# Patient Record
Sex: Male | Born: 1950
Health system: Southern US, Community
[De-identification: ages and names within clinical notes are randomized; demographics above are authoritative.]

## PROBLEM LIST (undated history)

## (undated) DIAGNOSIS — K5790 Diverticulosis of intestine, part unspecified, without perforation or abscess without bleeding: Secondary | ICD-10-CM

## (undated) DIAGNOSIS — K469 Unspecified abdominal hernia without obstruction or gangrene: Secondary | ICD-10-CM

## (undated) DIAGNOSIS — I219 Acute myocardial infarction, unspecified: Secondary | ICD-10-CM

## (undated) DIAGNOSIS — I2510 Atherosclerotic heart disease of native coronary artery without angina pectoris: Secondary | ICD-10-CM

## (undated) DIAGNOSIS — I471 Supraventricular tachycardia, unspecified: Secondary | ICD-10-CM

## (undated) DIAGNOSIS — E785 Hyperlipidemia, unspecified: Secondary | ICD-10-CM

## (undated) DIAGNOSIS — I10 Essential (primary) hypertension: Secondary | ICD-10-CM

## (undated) DIAGNOSIS — M1990 Unspecified osteoarthritis, unspecified site: Secondary | ICD-10-CM

## (undated) DIAGNOSIS — Q249 Congenital malformation of heart, unspecified: Secondary | ICD-10-CM

## (undated) DIAGNOSIS — T7840XA Allergy, unspecified, initial encounter: Secondary | ICD-10-CM

## (undated) DIAGNOSIS — I493 Ventricular premature depolarization: Secondary | ICD-10-CM

## (undated) DIAGNOSIS — E559 Vitamin D deficiency, unspecified: Secondary | ICD-10-CM

## (undated) DIAGNOSIS — G4730 Sleep apnea, unspecified: Secondary | ICD-10-CM

## (undated) DIAGNOSIS — F419 Anxiety disorder, unspecified: Secondary | ICD-10-CM

## (undated) DIAGNOSIS — K219 Gastro-esophageal reflux disease without esophagitis: Secondary | ICD-10-CM

## (undated) DIAGNOSIS — K635 Polyp of colon: Secondary | ICD-10-CM

## (undated) HISTORY — DX: Gastro-esophageal reflux disease without esophagitis: K21.9

## (undated) HISTORY — DX: Unspecified osteoarthritis, unspecified site: M19.90

## (undated) HISTORY — DX: Vitamin D deficiency, unspecified: E55.9

## (undated) HISTORY — PX: ROOT CANAL: SHX2363

## (undated) HISTORY — DX: Anxiety disorder, unspecified: F41.9

## (undated) HISTORY — DX: Hyperlipidemia, unspecified: E78.5

## (undated) HISTORY — PX: COLONOSCOPY: SHX174

## (undated) HISTORY — PX: CORONARY ANGIOPLASTY WITH STENT PLACEMENT: SHX49

## (undated) HISTORY — DX: Ventricular premature depolarization: I49.3

## (undated) HISTORY — DX: Allergy, unspecified, initial encounter: T78.40XA

## (undated) HISTORY — DX: Essential (primary) hypertension: I10

## (undated) HISTORY — DX: Diverticulosis of intestine, part unspecified, without perforation or abscess without bleeding: K57.90

## (undated) HISTORY — DX: Congenital malformation of heart, unspecified: Q24.9

## (undated) HISTORY — DX: Supraventricular tachycardia, unspecified: I47.10

## (undated) HISTORY — DX: Unspecified abdominal hernia without obstruction or gangrene: K46.9

## (undated) HISTORY — DX: Atherosclerotic heart disease of native coronary artery without angina pectoris: I25.10

## (undated) HISTORY — PX: OTHER SURGICAL HISTORY: SHX169

## (undated) HISTORY — PX: CARDIAC ELECTROPHYSIOLOGY MAPPING AND ABLATION: SHX1292

## (undated) HISTORY — DX: Polyp of colon: K63.5

## (undated) HISTORY — DX: Supraventricular tachycardia: I47.1

## (undated) HISTORY — DX: Sleep apnea, unspecified: G47.30

## (undated) HISTORY — PX: APPENDECTOMY: SHX54

## (undated) HISTORY — PX: ACHILLES TENDON REPAIR: SUR1153

## (undated) HISTORY — DX: Acute myocardial infarction, unspecified: I21.9

## (undated) SURGERY — Surgical Case
Anesthesia: *Unknown

---

## 2001-04-17 ENCOUNTER — Encounter: Payer: Self-pay | Admitting: *Deleted

## 2001-04-17 ENCOUNTER — Inpatient Hospital Stay (HOSPITAL_COMMUNITY): Admission: EM | Admit: 2001-04-17 | Discharge: 2001-04-20 | Payer: Self-pay | Admitting: *Deleted

## 2001-05-18 ENCOUNTER — Encounter (HOSPITAL_COMMUNITY): Admission: RE | Admit: 2001-05-18 | Discharge: 2001-08-16 | Payer: Self-pay | Admitting: Cardiology

## 2001-10-04 ENCOUNTER — Ambulatory Visit (HOSPITAL_COMMUNITY): Admission: RE | Admit: 2001-10-04 | Discharge: 2001-10-04 | Payer: Self-pay | Admitting: Internal Medicine

## 2001-10-04 ENCOUNTER — Encounter: Payer: Self-pay | Admitting: Internal Medicine

## 2004-08-12 ENCOUNTER — Ambulatory Visit: Payer: Self-pay | Admitting: Internal Medicine

## 2004-09-10 ENCOUNTER — Ambulatory Visit: Payer: Self-pay | Admitting: Internal Medicine

## 2004-09-16 ENCOUNTER — Ambulatory Visit: Payer: Self-pay | Admitting: Internal Medicine

## 2007-12-13 ENCOUNTER — Ambulatory Visit (HOSPITAL_COMMUNITY): Admission: RE | Admit: 2007-12-13 | Discharge: 2007-12-13 | Payer: Self-pay | Admitting: Internal Medicine

## 2009-08-13 ENCOUNTER — Encounter (INDEPENDENT_AMBULATORY_CARE_PROVIDER_SITE_OTHER): Payer: Self-pay | Admitting: *Deleted

## 2010-02-05 NOTE — Letter (Signed)
Summary: Colonoscopy Letter  [facility]  [address] [place], [place] [address]   Phone: [phone]  Fax: [phone]      August 13, 2009 MRN: [id number]   [facility] [address] [place], [place]  [address]   Dear Mr. COLGLAZIER,   According to your medical record, it is time for you to schedule a Colonoscopy. The American Cancer Society recommends this procedure as a method to detect early colon cancer. Patients with a family history of colon cancer, or a personal history of colon polyps or inflammatory bowel disease are at increased risk.  This letter has been generated based on the recommendations made at the time of your procedure. If you feel that in your particular situation this may no longer apply, please contact our office.  Please call our office at [phone] to schedule this appointment or to update your records at your earliest convenience.  Thank you for cooperating with [place] to provide you with the very best care possible.   Sincerely,  Hedwig Morton. Juanda Chance, M.D.  [facility] Gastroenterology Division [phone]

## 2010-05-24 NOTE — Discharge Summary (Signed)
[place]. [facility]  Patient:    Bryan [name], Bryan [name] Visit Number: [id number] MRN: [id number]          Service Type: MED Location: [facility] 2901 01 Attending Physician:  Mora Appl Dictated by:   Darden Palmer., M.D. Admit Date:  04/17/2001 Disc. Date: 04/20/01   CC:         Sung Amabile, M.D.   Discharge Summary  FINAL DIAGNOSIS: 1. Subendocardial infarction, initial episode.    a. Stenting done of the obtuse marginal artery.    b. Stenosis going from 99% to 0%.    c. Residual atherosclerotic disease involving the distal LAD of       moderate severity.    d. Mild to moderate severity of the proximal LAD.    e. Moderate to severe right coronary artery disease. 2. Treated hypertension. 3. Treated hyperlipidemia. 4. Low back pain.  PROCEDURES: Cardiac catheterization, angioplasty, and stenting of the circumflex marginal branch.  HISTORY OF PRESENT ILLNESS: This 60 year old male had long standing history of hypertension and hyperlipidemia. He had had a Cardiolite scan done several weeks ago that was negative. While at a barbecue at church, he assisted in carrying a grill and developed crushing substernal chest pain. He took three Tylenol and Xanax. He attributed the pain to muscle spasm. He took baby aspirin. He attempted to lay flat but became anxious, diaphoretic, with nausea and vomiting. Following the emesis, the pain resolved somewhat. He had a strongly positive family history of heart disease and high cholesterol. He was admitted to rule out an MI. Please see the previously dictated history and physical for remained of details.  LABORATORY DATA: On admission shows hemoglobin of 15.3, hematocrit of 43.8. Platelet count 207,000. Potassium 3.3, glucose 139, BUN 20, creatinine 1.6, repeat potassium is 4.0. On the next day, repeat glucose was 81. Initial CPK was 247 with MB of 3.8 and rose to 340 with an MB of 24.4 and troponin of 1.13. CPK  peaked at 340. He was placed on IV integrilin and Nitroglycerin when he had some recurrent symptoms. He had ST depression noted on EKG.  HOSPITAL COURSE: He was taken to the catheterization laboratory on April 19, 2001 where he was found to have normal LV function. LAD had moderate 40-50% proximal stenosis. There was distal 70-80% stenosis at the apex. Circumflex marginal had an eccentric 99% stenosis in a large vessel. 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 He underwent angioplasty and stenting of the circumflex marginal branch with a 3.5 x 13 mm Zeta stent, with stenosis going from 99% to 0% residual. He continued on Tegrelin following this for 18 hours and was stable. MB was not elevated significantly following the procedure. EKG showed improvement in lateral ST depression. He was seen by cardiac rehab and was recommended for phase II. Discussed diet, activity restrictions and also that he is to avoid heavy weight lifting. It is recommended that he have an LDL cholesterol of less than 100. He may need to go back on statin therapy to accomplish this.  DISCHARGE MEDICATIONS: 1. Aspirin 325 mg daily. 2. Plavix 75 mg daily. 3. Atenolol 100 mg daily. 4. Accupril 20 mg daily. 5. Nitroglycerin 1/150 sublingual p.r.n. 6. Tricor 167 mg daily.  ACTIVITY: He is to walk daily and see me in follow-up in one week instead of two weeks. Dictated by:   Darden Palmer., M.D. Attending Physician:  Mora Appl DD:  04/20/01 TD:  04/20/01 Job: [id number]

## 2010-05-24 NOTE — Cardiovascular Report (Signed)
[place]. [facility]  Patient:    Bryan [name], Bryan [name] Visit Number: [id number] MRN: [id number]          Service Type: MED Location: [facility] 2901 01 Attending Physician:  Mora Appl Dictated by:   Darden Palmer., M.D. Proc. Date: 04/19/01 Admit Date:  04/17/2001   CC:         Marinus Maw, M.D.   Cardiac Catheterization  HISTORY:  A 60 year old male with acute coronary syndrome with a non-Q-wave infarction.  COMMENTS ABOUT PROCEDURE:  The patient was brought to the catheterization laboratory and prepped and draped in the usual manner.  After Xylocaine anesthesia, a #6 French sheath was placed in the right femoral artery percutaneously.  Angiograms were made using #6 French catheters and a 30-cc ventriculogram was performed.  Arrangements were made to proceed with stenting.  Heparin, 4500 units, was administered with an ACT of 250.  The patient was previously on Integrilin and IV nitroglycerin.  The sheath was exchanged for a #7 [place] sheath.  Angioplasty equipment used was a #7 [place] JL-4 guiding catheter, HTF-J 0.014 guidewire which crossed the lesion easily. A 3.5 x 18-mm Zeta Multilink stent was deployed easily to a maximum of 13 atmospheres.  He had minimal symptoms during balloon inflations.  Following the stent, post dilatation angiograms were performed and he was returned to the transitional care unit in stable condition.  Plavix, 300 mg, was administered in the lab.  He tolerated the procedure well.  HEMODYNAMIC DATA: 1. Aorta post contrast 114/79. 2. LV post contrast 114/12.  ANGIOGRAPHIC DATA:  LEFT VENTRICULOGRAM:  Performed in the 30-degree RAO projection.  Aortic valve was normal.  The mitral valve was normal.  The left ventricle appears normal in size.  The estimated ejection fraction was 60-65%.  CORONARY ARTERIOGRAPHY:  Coronary arteries arise and distribute normally. There is mild calcification noted in the proximal  LAD as well as the distal right coronary artery. 1. The left main coronary artery appears normal.  2. The left anterior descending has moderate segmental proximal disease.    There is severe 75-80% stenosis present at the apex.  3. The circumflex marginal artery has an eccentric segmental 99% stenosis with    TIMI grade 2 flow noted.  4. The right coronary artery is a large vessel with some aneurysmal dilatation    noted.  There is some focal 60% mid vessel stenosis and a 70% stenosis with    some aneurysmal dilatation.  The ostium of the posterior descending has a    70% stenosis and the mid portion of the posterior descending has a 70%    stenosis.  Post dilatation angiograms of the circumflex reveal 0% residual stenosis. There is full stent deployment and preservation of all side branches.  TIMI flow is now grade 3.  IMPRESSION: 1. Successful stenting in the setting of an acute coronary syndrome of the    circumflex with stenosis going from 99% to 0%. 2. Residual atherosclerotic disease involving the apex of the left anterior    descending, the mid right coronary artery, and the posterior descending    artery, with mild to moderate disease in the proximal left anterior    descending artery. 3. Normal left ventricular function. Dictated by:   Darden Palmer., M.D. Attending Physician:  Mora Appl DD:  04/19/01 TD:  04/19/01 Job: [id number]

## 2010-06-27 ENCOUNTER — Encounter (INDEPENDENT_AMBULATORY_CARE_PROVIDER_SITE_OTHER): Payer: Self-pay | Admitting: Surgery

## 2011-01-21 ENCOUNTER — Ambulatory Visit (HOSPITAL_COMMUNITY)
Admission: RE | Admit: 2011-01-21 | Discharge: 2011-01-21 | Disposition: A | Discharge status: Home / Self Care | Payer: Managed Care, Other (non HMO) | Source: Ambulatory Visit | Attending: Internal Medicine | Admitting: Internal Medicine

## 2011-01-21 ENCOUNTER — Other Ambulatory Visit (HOSPITAL_COMMUNITY): Payer: Self-pay | Admitting: Internal Medicine

## 2011-01-21 DIAGNOSIS — R0989 Other specified symptoms and signs involving the circulatory and respiratory systems: Secondary | ICD-10-CM | POA: Insufficient documentation

## 2011-01-21 DIAGNOSIS — R05 Cough: Secondary | ICD-10-CM

## 2011-01-21 DIAGNOSIS — R059 Cough, unspecified: Secondary | ICD-10-CM | POA: Insufficient documentation

## 2011-09-09 ENCOUNTER — Encounter: Payer: Self-pay | Admitting: Internal Medicine

## 2011-10-07 ENCOUNTER — Encounter: Payer: Self-pay | Admitting: Internal Medicine

## 2011-11-17 ENCOUNTER — Encounter: Payer: Self-pay | Admitting: *Deleted

## 2011-12-10 ENCOUNTER — Encounter: Payer: Self-pay | Admitting: Internal Medicine

## 2011-12-10 ENCOUNTER — Ambulatory Visit (INDEPENDENT_AMBULATORY_CARE_PROVIDER_SITE_OTHER): Payer: BC Managed Care – PPO | Admitting: Internal Medicine

## 2011-12-10 VITALS — BP 100/68 | HR 72 | Ht 69.5 in | Wt 212.6 lb

## 2011-12-10 DIAGNOSIS — D689 Coagulation defect, unspecified: Secondary | ICD-10-CM

## 2011-12-10 DIAGNOSIS — Z1211 Encounter for screening for malignant neoplasm of colon: Secondary | ICD-10-CM

## 2011-12-10 MED ORDER — MOVIPREP 100 G PO SOLR: 1.0000 | Freq: Once | ORAL | Status: DC

## 2011-12-10 NOTE — Progress Notes (Signed)
Bryan [name] 02/23/1950 MRN [id number]    History of Present Illness:  This is a 61 year old white male who is here to discuss a recall colonoscopy. His mother, who is our patient, had in situ adenocarcinoma of the rectosigmoid. His last colonoscopy was in September 2006 and it was a normal exam. He, at that time was taking Plavix and he remained on Plavix throughout the colonoscopy. There is a history of subendocardial infarction in 2003. He has been followed by Dr Donnie Aho and has a Cardiolite scan every 2 years. He was told to continue Plavix indefinitely. He has occasional right lower quadrant abdominal discomfort. His bowel habits are otherwise regular with no rectal bleeding.   Past Medical History  Diagnosis Date  . Heart attack   . Hyperlipidemia   . Hypertension   . Hernia   . Diverticulosis    Past Surgical History  Procedure Date  . Appendectomy   . Heart catherization   . Achilles tendon repair     left  . Cardiac electrophysiology mapping and ablation     reports that he has never smoked. He has never used smokeless tobacco. He reports that he drinks alcohol. He reports that he does not use illicit drugs. family history includes Cancer in his father; Colon cancer in his mother; Hypothyroidism in his mother; and Irritable bowel syndrome in his mother. Allergies  Allergen Reactions  . Penicillins Swelling        Review of Systems:  The remainder of the 10 point ROS is negative except as outlined in H&P   Physical Exam: General appearance  Well developed, in no distress. Eyes- non icteric. HEENT nontraumatic, normocephalic. Mouth no lesions, tongue papillated, no cheilosis. Neck supple without adenopathy, thyroid not enlarged, no carotid bruits, no JVD. Lungs Clear to auscultation bilaterally. Cor normal S1, normal S2, regular rhythm, no murmur,  quiet precordium. Abdomen: Soft nontender abdomen with normal active bowel sounds. No distention. No palpable  mass. Rectal: Not done. Extremities no pedal edema. Skin no lesions. Neurological alert and oriented x 3. Psychological normal mood and affect.  Assessment and Plan:  Problem #1 This is a 61 year old white male who is due for a recall colonoscopy because of family history of colon cancer in his mother. We would like to hold the Plavix prior to the colonoscopy. We will ask Dr. York Spaniel permission to do that. If he cannot get off Plavix, we will proceed with a colonoscopy while on Plavix. Patient agrees with the plan. We have discussed the prep and the sedation as well.   12/10/2011 Lina Sar

## 2011-12-10 NOTE — Patient Instructions (Addendum)
You have been scheduled for a colonoscopy with propofol. Please follow written instructions given to you at your visit today.  Please pick up your prep kit at the pharmacy within the next 1-3 days. If you use inhalers (even only as needed) or a CPAP machine, please bring them with you on the day of your procedure.  You will be contacted by our office prior to your procedure for directions on holding your Plavix.  If you do not hear from our office 1 week prior to your scheduled procedure, please call [phone] to discuss.   CC: Dr Lucky Cowboy, Dr Ellwood Handler

## 2011-12-11 ENCOUNTER — Telehealth: Payer: Self-pay | Admitting: *Deleted

## 2011-12-11 NOTE — Telephone Encounter (Signed)
Patient advised that Per Dr Donnie Aho, he may discontinue his Plavix 5 days prior to his colonoscopy and restart after procedure. Patient verbalizes understanding.

## 2011-12-24 ENCOUNTER — Encounter: Payer: BC Managed Care – PPO | Admitting: Internal Medicine

## 2011-12-24 ENCOUNTER — Telehealth: Payer: Self-pay | Admitting: *Deleted

## 2011-12-24 ENCOUNTER — Telehealth: Payer: Self-pay | Admitting: Internal Medicine

## 2011-12-24 NOTE — Telephone Encounter (Signed)
Patient was scheduled for colonoscopy on 12/24/11 @ 11:30 am. He no showed this appointment. Dr Juanda Chance and patient spoke and patient was apparently confused about his appointment date. He thought the procedure was on 12/25/11. Dr Juanda Chance has asked that patient be added to a schedule for tomorrow, 12/25/11 @ 11:00 am for colonoscopy procedure. I have spoken with Jennye Boroughs, RN who states that since no 11:00 am procedure is scheduled for Dr Russella Dar, Dr Juanda Chance may take that opening. Therefore, patient has been scheduled for 11:00 am 12/25/11 in [facility] for colon. I have spoken with patient and have again gone over the revised prep instructions (as it seems he ate breakfast this morning and was not clear on when to start drinking his prep). He states that he does understands. Dr Juanda Chance advised of upcoming appointment as well.

## 2011-12-24 NOTE — Telephone Encounter (Signed)
Tried patients home, cell, and work number as per chart to see if patient cancelled his procedure for today as there are time discrepancies. Left messages on all numbers in chart that identify patient by first and last name. Waiting for a return call. ewm

## 2011-12-25 ENCOUNTER — Ambulatory Visit (AMBULATORY_SURGERY_CENTER): Payer: BC Managed Care – PPO | Admitting: Internal Medicine

## 2011-12-25 ENCOUNTER — Encounter: Payer: Self-pay | Admitting: Internal Medicine

## 2011-12-25 ENCOUNTER — Telehealth: Payer: Self-pay | Admitting: *Deleted

## 2011-12-25 VITALS — BP 110/60 | HR 65 | Temp 97.8°F | Resp 18 | Ht 69.5 in | Wt 212.0 lb

## 2011-12-25 DIAGNOSIS — Z1211 Encounter for screening for malignant neoplasm of colon: Secondary | ICD-10-CM

## 2011-12-25 DIAGNOSIS — D689 Coagulation defect, unspecified: Secondary | ICD-10-CM

## 2011-12-25 DIAGNOSIS — Z800 Family history of malignant neoplasm of digestive organs: Secondary | ICD-10-CM

## 2011-12-25 MED ORDER — SODIUM CHLORIDE 0.9 % IV SOLN: 500.0000 mL | INTRAVENOUS | Status: DC

## 2011-12-25 NOTE — Progress Notes (Signed)
Patient did not experience any of the following events: a burn prior to discharge; a fall within the facility; wrong site/side/patient/procedure/implant event; or a hospital transfer or hospital admission upon discharge from the facility. (G8907) Patient did not have preoperative order for IV antibiotic SSI prophylaxis. (G8918)  

## 2011-12-25 NOTE — Telephone Encounter (Signed)
  Follow up Call-home phone/ not his phone     Patient questions:   No message left.

## 2011-12-25 NOTE — Op Note (Signed)
[facility] [address] [place], [address]   COLONOSCOPY PROCEDURE REPORT  PATIENT: Bryan [name], Bryan [name]  MR#: [id number] BIRTHDATE: [birth year] , 61  yrs. old GENDER: Male ENDOSCOPIST: Hart Carwin, MD REFERRED BY:  Lucky Cowboy, M.D. ,Dr Justice Britain PROCEDURE DATE:  12/25/2011 PROCEDURE:   Colonoscopy, screening ASA CLASS:   Class III INDICATIONS:Patient's immediate family history of colon cancer and mother with colon cancer, last colon 2006, hx MI 2003 Plavix on hold. MEDICATIONS: MAC sedation, administered by CRNA and Propofol (Diprivan) 180 mg IV  DESCRIPTION OF PROCEDURE:   After the risks and benefits and of the procedure were explained, informed consent was obtained.  A digital rectal exam revealed no abnormalities of the rectum.    The LB [id number]  endoscope was introduced through the anus and advanced to the cecum, which was identified by both the appendix and ileocecal valve .  The quality of the prep was excellent, using MoviPrep .  The instrument was then slowly withdrawn as the colon was fully examined.     COLON FINDINGS: A normal appearing cecum, ileocecal valve, and appendiceal orifice were identified.  The ascending, hepatic flexure, transverse, splenic flexure, descending, sigmoid colon and rectum appeared unremarkable.  No polyps or cancers were seen. Retroflexed views revealed no abnormalities.     The scope was then withdrawn from the patient and the procedure completed.  COMPLICATIONS: There were no complications. ENDOSCOPIC IMPRESSION: Normal colon  RECOMMENDATIONS: High fiber diet   REPEAT EXAM: In 5 year(s)  for Colonoscopy.  cc:  _______________________________ eSignedHart Carwin, MD 12/25/2011 11:24 AM

## 2011-12-25 NOTE — Progress Notes (Signed)
1125 a/ox3 pleased with MAC report to [facility]

## 2011-12-25 NOTE — Patient Instructions (Addendum)
Discharge instructions given with verbal understanding. Normal exam. Resume previous medications. YOU HAD AN ENDOSCOPIC PROCEDURE TODAY AT THE [facility]: Refer to the procedure report that was given to you for any specific questions about what was found during the examination.  If the procedure report does not answer your questions, please call your gastroenterologist to clarify.  If you requested that your care partner not be given the details of your procedure findings, then the procedure report has been included in a sealed envelope for you to review at your convenience later.  YOU SHOULD EXPECT: Some feelings of bloating in the abdomen. Passage of more gas than usual.  Walking can help get rid of the air that was put into your GI tract during the procedure and reduce the bloating. If you had a lower endoscopy (such as a colonoscopy or flexible sigmoidoscopy) you may notice spotting of blood in your stool or on the toilet paper. If you underwent a bowel prep for your procedure, then you may not have a normal bowel movement for a few days.  DIET: Your first meal following the procedure should be a light meal and then it is ok to progress to your normal diet.  A half-sandwich or bowl of soup is an example of a good first meal.  Heavy or fried foods are harder to digest and may make you feel nauseous or bloated.  Likewise meals heavy in dairy and vegetables can cause extra gas to form and this can also increase the bloating.  Drink plenty of fluids but you should avoid alcoholic beverages for 24 hours.  ACTIVITY: Your care partner should take you home directly after the procedure.  You should plan to take it easy, moving slowly for the rest of the day.  You can resume normal activity the day after the procedure however you should NOT DRIVE or use heavy machinery for 24 hours (because of the sedation medicines used during the test).    SYMPTOMS TO REPORT IMMEDIATELY: A gastroenterologist  can be reached at any hour.  During normal business hours, 8:30 AM to 5:00 PM Monday through Friday, call [phone].  After hours and on weekends, please call the GI answering service at [phone] who will take a message and have the physician on call contact you.   Following lower endoscopy (colonoscopy or flexible sigmoidoscopy):  Excessive amounts of blood in the stool  Significant tenderness or worsening of abdominal pains  Swelling of the abdomen that is new, acute  Fever of 100F or higher  FOLLOW UP: If any biopsies were taken you will be contacted by phone or by letter within the next 1-3 weeks.  Call your gastroenterologist if you have not heard about the biopsies in 3 weeks.  Our staff will call the home number listed on your records the next business day following your procedure to check on you and address any questions or concerns that you may have at that time regarding the information given to you following your procedure. This is a courtesy call and so if there is no answer at the home number and we have not heard from you through the emergency physician on call, we will assume that you have returned to your regular daily activities without incident.  SIGNATURES/CONFIDENTIALITY: You and/or your care partner have signed paperwork which will be entered into your electronic medical record.  These signatures attest to the fact that that the information above on your After Visit Summary has been reviewed   and is understood.  Full responsibility of the confidentiality of this discharge information lies with you and/or your care-partner. 

## 2011-12-26 ENCOUNTER — Telehealth: Payer: Self-pay

## 2011-12-26 NOTE — Telephone Encounter (Signed)
Left message on answering machine. 

## 2012-09-06 ENCOUNTER — Other Ambulatory Visit: Payer: Self-pay

## 2012-09-06 ENCOUNTER — Encounter (HOSPITAL_COMMUNITY): Payer: Self-pay | Admitting: *Deleted

## 2012-09-06 DIAGNOSIS — E785 Hyperlipidemia, unspecified: Secondary | ICD-10-CM | POA: Insufficient documentation

## 2012-09-06 DIAGNOSIS — Z8674 Personal history of sudden cardiac arrest: Secondary | ICD-10-CM | POA: Insufficient documentation

## 2012-09-06 DIAGNOSIS — T4271XA Poisoning by unspecified antiepileptic and sedative-hypnotic drugs, accidental (unintentional), initial encounter: Secondary | ICD-10-CM | POA: Insufficient documentation

## 2012-09-06 DIAGNOSIS — Z880 Allergy status to penicillin: Secondary | ICD-10-CM | POA: Insufficient documentation

## 2012-09-06 DIAGNOSIS — I10 Essential (primary) hypertension: Secondary | ICD-10-CM | POA: Insufficient documentation

## 2012-09-06 DIAGNOSIS — T426X1A Poisoning by other antiepileptic and sedative-hypnotic drugs, accidental (unintentional), initial encounter: Secondary | ICD-10-CM | POA: Insufficient documentation

## 2012-09-06 DIAGNOSIS — Y939 Activity, unspecified: Secondary | ICD-10-CM | POA: Insufficient documentation

## 2012-09-06 DIAGNOSIS — Z79899 Other long term (current) drug therapy: Secondary | ICD-10-CM | POA: Insufficient documentation

## 2012-09-06 DIAGNOSIS — Z8719 Personal history of other diseases of the digestive system: Secondary | ICD-10-CM | POA: Insufficient documentation

## 2012-09-06 DIAGNOSIS — Y929 Unspecified place or not applicable: Secondary | ICD-10-CM | POA: Insufficient documentation

## 2012-09-06 NOTE — ED Notes (Addendum)
Poison Control called ED prior to POV.  Pt. Took x 3 200mg  benzonatate tablets, instead of Vitamin.  Concerns: Tachycardia, Seizures. Poison Control concerned more so ventricular tachycardia. Hx. Of Mi. Poison control would like for the ED to monitor for 3 hours.

## 2012-09-07 ENCOUNTER — Emergency Department (HOSPITAL_COMMUNITY)
Admission: EM | Admit: 2012-09-07 | Discharge: 2012-09-07 | Disposition: A | Discharge status: Home / Self Care | Payer: BC Managed Care – PPO | Attending: Emergency Medicine | Admitting: Emergency Medicine

## 2012-09-07 DIAGNOSIS — T50901A Poisoning by unspecified drugs, medicaments and biological substances, accidental (unintentional), initial encounter: Secondary | ICD-10-CM

## 2012-09-07 LAB — COMPREHENSIVE METABOLIC PANEL
Albumin: 3.4 g/dL — ABNORMAL LOW (ref 3.5–5.2)
BUN: 15 mg/dL (ref 6–23)
Calcium: 8.8 mg/dL (ref 8.4–10.5)
GFR calc Af Amer: >90 mL/min (ref >90)
Glucose, Bld: 100 mg/dL — ABNORMAL HIGH (ref 70–99)
Sodium: 137 mEq/L (ref 135–145)
Total Protein: 6.3 g/dL (ref 6.0–8.3)

## 2012-09-07 LAB — CBC WITH DIFFERENTIAL/PLATELET
Basophils Absolute: 0 10*3/uL (ref 0.0–0.1)
Lymphocytes Relative: 27 % (ref 12–46)
Neutro Abs: 3.7 10*3/uL (ref 1.7–7.7)
Neutrophils Relative %: 59 % (ref 43–77)
Platelets: 149 10*3/uL — ABNORMAL LOW (ref 150–400)
RDW: 12.2 % (ref 11.5–15.5)
WBC: 6.3 10*3/uL (ref 4.0–10.5)

## 2012-09-07 NOTE — ED Notes (Signed)
Patient took Tessalon Pearls 200 mg x 4 tablets at 2300 tonight by accident. He thought that they were his Vitamin D pills. States the they both look alike. Both are clear capsules with a yellow tint. Realized that he took the wrong medication when he went to put the bottle back into his medicine bag. Immediately notified poison control. Was instructed to report to the ED for evaluation. Patient AAOx4, resp e/u, VSS, NAD noted at this time. Will continue to monitor.

## 2012-09-07 NOTE — ED Provider Notes (Signed)
CSN: [id number]     Arrival date & time 09/06/12  2340 History   First MD Initiated Contact with Patient 09/07/12 0115     Chief Complaint  Patient presents with  . Ingestion   (Consider location/radiation/quality/duration/timing/severity/associated sxs/prior Treatment) Patient is a 62 y.o. male presenting with Ingested Medication. The history is provided by the patient.  Ingestion  He accidentally took 4 capsules a benzonatate 200 mg at about 11 PM. He but he was taking vitamin D capsules which looked very similar. He called poison control who recommended that he come to the ED for monitoring. He has no complaints whatsoever now. He denies nausea, vomiting, dizziness, chest pain, palpitations.  Past Medical History  Diagnosis Date  . Heart attack   . Hyperlipidemia   . Hypertension   . Hernia   . Diverticulosis    Past Surgical History  Procedure Laterality Date  . Appendectomy    . Heart catherization    . Achilles tendon repair      left  . Cardiac electrophysiology mapping and ablation     Family History  Problem Relation Age of Onset  . Colon cancer Mother     malignant polyp of rectosigmoid colon  . Hypothyroidism Mother   . Irritable bowel syndrome Mother   . Cancer Father     brain   History  Substance Use Topics  . Smoking status: Never Smoker   . Smokeless tobacco: Never Used  . Alcohol Use: Yes     Comment: rarely    Review of Systems  All other systems reviewed and are negative.    Allergies  Penicillins  Home Medications   Current Outpatient Rx  Name  Route  Sig  Dispense  Refill  . benzonatate (TESSALON) 200 MG capsule   Oral   Take 800 mg by mouth once.         . Cholecalciferol (VITAMIN D-3) 5000 UNITS TABS   Oral   Take 20,000 Units by mouth every other day.         . clonazePAM (KLONOPIN) 2 MG tablet   Oral   Take 2 mg by mouth at bedtime.          . clopidogrel (PLAVIX) 75 MG tablet   Oral   Take 75 mg by mouth daily.         . rosuvastatin (CRESTOR) 10 MG tablet   Oral   Take 10 mg by mouth daily.         . simvastatin (ZOCOR) 5 MG tablet   Oral   Take 5 mg by mouth at bedtime.          BP 112/64  Pulse 66  Temp(Src) 97.7 F (36.5 C)  Resp 9  Ht 5\' 10"  (1.778 m)  Wt 205 lb (92.987 kg)  BMI 29.41 kg/m2  SpO2 95% Physical Exam  Nursing note and vitals reviewed.  62 year old male, resting comfortably and in no acute distress. Vital signs are normal. Oxygen saturation is 95%, which is normal. Head is normocephalic and atraumatic. PERRLA, EOMI. Oropharynx is clear. Neck is nontender and supple without adenopathy or JVD. Back is nontender and there is no CVA tenderness. Lungs are clear without rales, wheezes, or rhonchi. Chest is nontender. Heart has regular rate and rhythm without murmur. Abdomen is soft, flat, nontender without masses or hepatosplenomegaly and peristalsis is normoactive. Extremities have no cyanosis or edema, full range of motion is present. Skin is warm and dry without rash. Neurologic:  Mental status is normal, cranial nerves are intact, there are no motor or sensory deficits.  ED Course  Procedures (including critical care time) Labs Review Results for orders placed during the hospital encounter of 09/07/12  CBC WITH DIFFERENTIAL      Result Value Range   WBC 6.3  4.0 - 10.5 K/uL   RBC 5.20  4.22 - 5.81 MIL/uL   Hemoglobin 15.7  13.0 - 17.0 g/dL   HCT 16.1  09.6 - 04.5 %   MCV 82.7  78.0 - 100.0 fL   MCH 30.2  26.0 - 34.0 pg   MCHC 36.5 (*) 30.0 - 36.0 g/dL   RDW 40.9  81.1 - 91.4 %   Platelets 149 (*) 150 - 400 K/uL   Neutrophils Relative % 59  43 - 77 %   Neutro Abs 3.7  1.7 - 7.7 K/uL   Lymphocytes Relative 27  12 - 46 %   Lymphs Abs 1.7  0.7 - 4.0 K/uL   Monocytes Relative 8  3 - 12 %   Monocytes Absolute 0.5  0.1 - 1.0 K/uL   Eosinophils Relative 6 (*) 0 - 5 %   Eosinophils Absolute 0.4  0.0 - 0.7 K/uL   Basophils Relative 0  0 - 1 %   Basophils  Absolute 0.0  0.0 - 0.1 K/uL  COMPREHENSIVE METABOLIC PANEL      Result Value Range   Sodium 137  135 - 145 mEq/L   Potassium 3.6  3.5 - 5.1 mEq/L   Chloride 103  96 - 112 mEq/L   CO2 25  19 - 32 mEq/L   Glucose, Bld 100 (*) 70 - 99 mg/dL   BUN 15  6 - 23 mg/dL   Creatinine, Ser 7.82  0.50 - 1.35 mg/dL   Calcium 8.8  8.4 - 95.6 mg/dL   Total Protein 6.3  6.0 - 8.3 g/dL   Albumin 3.4 (*) 3.5 - 5.2 g/dL   AST 23  0 - 37 U/L   ALT 23  0 - 53 U/L   Alkaline Phosphatase 56  39 - 117 U/L   Total Bilirubin 0.3  0.3 - 1.2 mg/dL   GFR calc non Af Amer 88 (*) >90 mL/min   GFR calc Af Amer >90  >90 mL/min     MDM   1. Accidental drug overdose, initial encounter    Accidental ingestion of benzonatate. This is very likely a nontoxic ingestion. However, poison control has recommended observation for 3 hours with cardiac monitoring because of risk of ventricular tachycardia. He'll be observed and discharged if no arrhythmia during the 3 hours of observation.  He has completed 3 hours of observation with no arrhythmias noted. He is discharged.    Dione Booze, MD 09/07/12 [phone]

## 2012-09-07 NOTE — ED Notes (Signed)
Onalee Hua, from poison control phoned to check on patient. Vitals have been stable. No N/V, diaphoresis or altered mental status observed. Onalee Hua states that since patient has been asymptomatic the entire time, he should be ok for discharge, as any symptoms would have manifested by now. Notified EDP.

## 2012-11-16 ENCOUNTER — Encounter: Payer: Self-pay | Admitting: Internal Medicine

## 2012-11-16 ENCOUNTER — Ambulatory Visit: Payer: BC Managed Care – PPO | Admitting: Internal Medicine

## 2012-11-16 VITALS — BP 124/76 | HR 76 | Temp 98.2°F | Resp 16 | Ht 70.0 in | Wt 208.8 lb

## 2012-11-16 DIAGNOSIS — L040 Acute lymphadenitis of face, head and neck: Secondary | ICD-10-CM

## 2012-11-16 MED ORDER — PREDNISONE 20 MG PO TABS: ORAL_TABLET | ORAL | Status: DC

## 2012-11-16 MED ORDER — AZITHROMYCIN 250 MG PO TABS: ORAL_TABLET | ORAL | Status: AC

## 2012-11-16 NOTE — Progress Notes (Signed)
Patient ID: Bryan [name], male   DOB: 1950-04-30, 62 y.o.   MRN: [id number]  Very nice 62 yo [name] present with 12- 24 hr hx/ rt. sub-mandibular painful swelling or lump which seems to have decreased slightly throughout the day. No sinus congestion or ear discomfort.. No sore throat. No chest congestion or cough or sputum.   Past Medical History  Diagnosis Date  . Heart attack   . Hyperlipidemia   . Hypertension   . Hernia   . Diverticulosis   . Vitamin D deficiency   . GERD (gastroesophageal reflux disease)     Current Outpatient Prescriptions on File Prior to Visit  Medication Sig Dispense Refill          . Cholecalciferol (VITAMIN D-3) 5000 UNITS TABS Take 50,000 Units by mouth every other day.       . clonazePAM (KLONOPIN) 2 MG tablet Take 2 mg by mouth at bedtime.       . clopidogrel (PLAVIX) 75 MG tablet Take 75 mg by mouth daily.       . rosuvastatin (CRESTOR) 10 MG tablet Take 10 mg by mouth daily.      Marland Kitchen             PRN Meds:Clonazepam or alprazolam hs      ROS- Review of systems negative expect for the pertinent information above.  Physical-  Filed Vitals:   11/16/12 1726  BP: 124/76  Pulse: 76  Temp: 98.2 F (36.8 C)  Resp: 16    General Appearance: Patient in no distress.  Eyes: PERRLA, EOMS, conjunctiva pink without drainage or nodules.  Sinuses: Nontender maxillary and frontal sinus tenderness.  ENT/Mouth: EACs patent, no swelling, erythema, drainage. TMs normal without erythema, bulging. Nasal mucosa/turbinates nl. Dentition- good hygiene. Post pharynx without erythematous, swelling or exudate. Tonsils without swelling, erythema or exudate.  Neck: Supple. Tender rt. anterior cervical adenopathy. No B,JVD.  Respiratory: BS equal & clear.  Carviovascular: RRR, no murmurs, rubs or gallops.  Assessment and plan  Problem List Items Addressed This Visit   None    Visit Diagnoses   Acute cervical adenitis    -  Primary    Relevant Medications        azithromycin (ZITHROMAX) tablet       predniSONE (DELTASONE) tablet       No orders of the defined types were placed in this encounter.

## 2012-11-16 NOTE — Patient Instructions (Signed)
Cervical Adenitis You have a swollen lymph gland in your neck. This commonly happens with Strep and virus infections, dental problems, insect bites, and injuries about the face, scalp, or neck. The lymph glands swell as the body fights the infection or heals the injury. Swelling and firmness typically lasts for several weeks after the infection or injury is healed. Rarely lymph glands can become swollen because of cancer or TB. Antibiotics are prescribed if there is evidence of an infection. Sometimes an infected lymph gland becomes filled with pus. This condition may require opening up the abscessed gland by draining it surgically. Most of the time infected glands return to normal within two weeks. Do not poke or squeeze the swollen lymph nodes. That may keep them from shrinking back to their normal size. If the lymph gland is still swollen after 2 weeks, further medical evaluation is needed.  SEEK IMMEDIATE MEDICAL CARE IF:  You have difficulty swallowing or breathing, increased swelling, severe pain, or a high fever.  Document Released: 12/23/2004 Document Revised: 03/17/2011 Document Reviewed: 06/14/2006 [employer] Patient Information 2014 [employer].  

## 2012-11-30 ENCOUNTER — Other Ambulatory Visit: Payer: Self-pay | Admitting: Internal Medicine

## 2012-11-30 ENCOUNTER — Telehealth: Payer: Self-pay | Admitting: *Deleted

## 2012-11-30 DIAGNOSIS — I10 Essential (primary) hypertension: Secondary | ICD-10-CM

## 2012-11-30 MED ORDER — BISOPROLOL-HYDROCHLOROTHIAZIDE 5-6.25 MG PO TABS: 1.0000 | ORAL_TABLET | Freq: Every day | ORAL | Status: DC

## 2012-11-30 NOTE — Telephone Encounter (Signed)
PHARMACY= [name]  [name]  At pts pharmacy is calling says they are not able to get  bystolic on manufacture backorder can u advise what to change pt to? Call [phone] ext. [phone] ask for [facility]

## 2012-12-22 ENCOUNTER — Other Ambulatory Visit: Payer: Self-pay | Admitting: Physician Assistant

## 2012-12-22 MED ORDER — ALPRAZOLAM 1 MG PO TABS: 1.0000 mg | ORAL_TABLET | Freq: Three times a day (TID) | ORAL | Status: DC | PRN

## 2012-12-23 NOTE — Telephone Encounter (Signed)
RX called in 12/22/2012

## 2013-01-12 ENCOUNTER — Encounter: Payer: Self-pay | Admitting: Physician Assistant

## 2013-01-12 ENCOUNTER — Ambulatory Visit (INDEPENDENT_AMBULATORY_CARE_PROVIDER_SITE_OTHER): Payer: BC Managed Care – PPO | Admitting: Physician Assistant

## 2013-01-12 VITALS — BP 106/70 | HR 60 | Temp 97.9°F | Resp 16 | Ht 70.5 in | Wt 208.0 lb

## 2013-01-12 DIAGNOSIS — E559 Vitamin D deficiency, unspecified: Secondary | ICD-10-CM | POA: Insufficient documentation

## 2013-01-12 DIAGNOSIS — I10 Essential (primary) hypertension: Secondary | ICD-10-CM

## 2013-01-12 DIAGNOSIS — R7309 Other abnormal glucose: Secondary | ICD-10-CM

## 2013-01-12 DIAGNOSIS — E782 Mixed hyperlipidemia: Secondary | ICD-10-CM

## 2013-01-12 DIAGNOSIS — Z79899 Other long term (current) drug therapy: Secondary | ICD-10-CM

## 2013-01-12 DIAGNOSIS — E785 Hyperlipidemia, unspecified: Secondary | ICD-10-CM

## 2013-01-12 DIAGNOSIS — Z23 Encounter for immunization: Secondary | ICD-10-CM

## 2013-01-12 NOTE — Patient Instructions (Signed)

## 2013-01-12 NOTE — Progress Notes (Signed)
HPI Patient presents for 3 month follow up with hypertension, hyperlipidemia,  and vitamin D. Patient's blood pressure has been controlled at home, today their BP is BP: 106/70 mmHg  Patient denies chest pain, shortness of breath, dizziness.  Patient's cholesterol is diet controlled. In addition they are on crestor and zetia and denies myalgias. The cholesterol last visit was LDL 62, trigs 174.  Patient is on Vitamin D supplement.   Current Medications:  Current Outpatient Prescriptions on File Prior to Visit  Medication Sig Dispense Refill  . ALPRAZolam (XANAX) 1 MG tablet take 1/2 to 1 tablet by mouth three times a day if needed for anxiety and sleep  90 tablet  0  . Cholecalciferol (VITAMIN D-3) 5000 UNITS TABS Take 50,000 Units by mouth every other day.       . clonazePAM (KLONOPIN) 2 MG tablet Take 2 mg by mouth at bedtime.       . clopidogrel (PLAVIX) 75 MG tablet Take 75 mg by mouth daily.       Marland Kitchen ezetimibe (ZETIA) 10 MG tablet Take 10 mg by mouth daily.      Marland Kitchen LISINOPRIL PO Take 10 mg by mouth daily.       . Nebivolol HCl (BYSTOLIC PO) Take by mouth.      . predniSONE (DELTASONE) 20 MG tablet 1 tab TID x 2 da - then 1 tab BID x 2 da - then 1 tab QD x 3 da  13 tablet  0  . rosuvastatin (CRESTOR) 10 MG tablet Take 10 mg by mouth daily.      . simvastatin (ZOCOR) 5 MG tablet Take 5 mg by mouth at bedtime.       No current facility-administered medications on file prior to visit.   Medical History:  Past Medical History  Diagnosis Date  . Heart attack   . Hyperlipidemia   . Hypertension   . Hernia   . Diverticulosis   . Vitamin D deficiency   . GERD (gastroesophageal reflux disease)    Allergies:  Allergies  Allergen Reactions  . Amoxicillin Hives and Swelling  . Lunesta [Eszopiclone]     Restless legs  . Penicillins Swelling    ROS Constitutional: Denies fever, chills, headaches, insomnia, fatigue, night sweats Eyes: Denies redness, blurred vision, diplopia, discharge,  itchy, watery eyes.  ENT: Denies congestion, post nasal drip, sore throat, earache, dental pain, Tinnitus, Vertigo, Sinus pain, snoring.  Cardio: Denies chest pain, palpitations, irregular heartbeat, dyspnea, diaphoresis, orthopnea, PND, claudication, edema Respiratory: denies cough, shortness of breath, wheezing.  Gastrointestinal: +GERD occ with hoarsness Denies dysphagia, heartburn, AB pain/ cramps, N/V, diarrhea, constipation, hematemesis, melena, hematochezia,  hemorrhoids Genitourinary: Denies dysuria, frequency, urgency, nocturia, hesitancy, discharge, hematuria, flank pain Musculoskeletal: Denies myalgia, stiffness, pain, swelling and strain/sprain. Skin: Denies pruritis, rash, changing in skin lesion Neuro: Denies Weakness, tremor, incoordination, spasms, pain Psychiatric: Denies confusion, memory loss, sensory loss Endocrine: Denies change in weight, skin, hair change, nocturia Diabetic Polys, Denies visual blurring, hyper /hypo glycemic episodes, and paresthesia, Heme/Lymph: Denies Excessive bleeding, bruising, enlarged lymph nodes  Family history- Review and unchanged Social history- Review and unchanged Physical Exam: Filed Vitals:   01/12/13 1440  BP: 106/70  Pulse: 60  Temp: 97.9 F (36.6 C)  Resp: 16   Filed Weights   01/12/13 1440  Weight: 208 lb (94.348 kg)   General Appearance: Well nourished, in no apparent distress. Eyes: PERRLA, EOMs, conjunctiva no swelling or erythema Sinuses: No Frontal/maxillary tenderness ENT/Mouth: Ext aud canals  clear, TMs without erythema, bulging. No erythema, swelling, or exudate on post pharynx.  Tonsils not swollen or erythematous. Hearing normal.  Neck: Supple, thyroid normal.  Respiratory: Respiratory effort normal, BS equal bilaterally without rales, rhonchi, wheezing or stridor.  Cardio: RRR with no MRGs. Brisk peripheral pulses without edema.  Abdomen: Soft, + BS.  Non tender, no guarding, rebound, hernias,  masses. Lymphatics: Non tender without lymphadenopathy.  Musculoskeletal: Full ROM, 5/5 strength, normal gait.  Skin: Warm, dry without rashes, lesions, ecchymosis.  Neuro: Cranial nerves intact. Normal muscle tone, no cerebellar symptoms. Sensation intact.  Psych: Awake and oriented X 3, normal affect, Insight and Judgment appropriate.   Assessment and Plan:  Hypertension: Continue medication, monitor blood pressure at home.  Continue DASH diet. Cholesterol: Continue diet and exercise. Check cholesterol.  Vitamin D Def- check level and continue medications.  Hoarseness- Nexium samples given for 10 days.   Continue diet and meds as discussed. Further disposition pending results of labs.  Vicie Mutters 2:59 PM

## 2013-01-13 DIAGNOSIS — Z23 Encounter for immunization: Secondary | ICD-10-CM

## 2013-01-13 LAB — HEPATIC FUNCTION PANEL
ALBUMIN: 4.4 g/dL (ref 3.5–5.2)
ALT: 27 U/L (ref 0–53)
AST: 23 U/L (ref 0–37)
Alkaline Phosphatase: 70 U/L (ref 39–117)
BILIRUBIN DIRECT: 0.1 mg/dL (ref 0.0–0.3)
BILIRUBIN TOTAL: 1 mg/dL (ref 0.3–1.2)
Indirect Bilirubin: 0.9 mg/dL (ref 0.0–0.9)
Total Protein: 7 g/dL (ref 6.0–8.3)

## 2013-01-13 LAB — CBC WITH DIFFERENTIAL/PLATELET
BASOS PCT: 1 % (ref 0–1)
Basophils Absolute: 0 10*3/uL (ref 0.0–0.1)
EOS ABS: 0.4 10*3/uL (ref 0.0–0.7)
EOS PCT: 5 % (ref 0–5)
HCT: 48.4 % (ref 39.0–52.0)
Hemoglobin: 16.6 g/dL (ref 13.0–17.0)
Lymphocytes Relative: 29 % (ref 12–46)
Lymphs Abs: 2 10*3/uL (ref 0.7–4.0)
MCH: 29.4 pg (ref 26.0–34.0)
MCHC: 34.3 g/dL (ref 30.0–36.0)
MCV: 85.8 fL (ref 78.0–100.0)
Monocytes Absolute: 0.5 10*3/uL (ref 0.1–1.0)
Monocytes Relative: 7 % (ref 3–12)
NEUTROS PCT: 58 % (ref 43–77)
Neutro Abs: 3.9 10*3/uL (ref 1.7–7.7)
PLATELETS: 226 10*3/uL (ref 150–400)
RBC: 5.64 MIL/uL (ref 4.22–5.81)
RDW: 13.1 % (ref 11.5–15.5)
WBC: 6.8 10*3/uL (ref 4.0–10.5)

## 2013-01-13 LAB — LIPID PANEL
CHOL/HDL RATIO: 4.9 ratio
CHOLESTEROL: 195 mg/dL (ref 0–200)
HDL: 40 mg/dL (ref >39)
TRIGLYCERIDES: 412 mg/dL — AB (ref <150)

## 2013-01-13 LAB — BASIC METABOLIC PANEL WITH GFR
BUN: 10 mg/dL (ref 6–23)
CALCIUM: 9.5 mg/dL (ref 8.4–10.5)
CO2: 29 mEq/L (ref 19–32)
CREATININE: 0.99 mg/dL (ref 0.50–1.35)
Chloride: 102 mEq/L (ref 96–112)
GFR, Est Non African American: 81 mL/min
Glucose, Bld: 84 mg/dL (ref 70–99)
Potassium: 4.3 mEq/L (ref 3.5–5.3)
SODIUM: 138 meq/L (ref 135–145)

## 2013-01-13 LAB — VITAMIN D 25 HYDROXY (VIT D DEFICIENCY, FRACTURES): VIT D 25 HYDROXY: 86 ng/mL (ref 30–89)

## 2013-01-13 LAB — TSH: TSH: 1.957 u[IU]/mL (ref 0.350–4.500)

## 2013-01-14 MED ORDER — EZETIMIBE 10 MG PO TABS: 10.0000 mg | ORAL_TABLET | Freq: Every day | ORAL | Status: DC

## 2013-01-14 NOTE — Addendum Note (Signed)
Addended by: Vicie Mutters R on: 01/14/2013 01:00 PM   Modules accepted: Orders

## 2013-01-31 ENCOUNTER — Other Ambulatory Visit: Payer: Self-pay | Admitting: Internal Medicine

## 2013-02-02 ENCOUNTER — Other Ambulatory Visit: Payer: Self-pay | Admitting: Emergency Medicine

## 2013-02-02 MED ORDER — CLONAZEPAM 2 MG PO TABS: 2.0000 mg | ORAL_TABLET | Freq: Every day | ORAL | Status: DC

## 2013-03-18 ENCOUNTER — Other Ambulatory Visit: Payer: Self-pay | Admitting: Internal Medicine

## 2013-03-21 ENCOUNTER — Other Ambulatory Visit: Payer: Self-pay | Admitting: Physician Assistant

## 2013-04-18 ENCOUNTER — Other Ambulatory Visit: Payer: Self-pay | Admitting: Physician Assistant

## 2013-05-23 ENCOUNTER — Other Ambulatory Visit: Payer: Self-pay | Admitting: Physician Assistant

## 2013-05-26 ENCOUNTER — Other Ambulatory Visit: Payer: Self-pay | Admitting: *Deleted

## 2013-05-26 MED ORDER — ROSUVASTATIN CALCIUM 10 MG PO TABS: 10.0000 mg | ORAL_TABLET | Freq: Every day | ORAL | Status: DC

## 2013-06-02 ENCOUNTER — Ambulatory Visit (INDEPENDENT_AMBULATORY_CARE_PROVIDER_SITE_OTHER): Payer: BC Managed Care – PPO | Admitting: Internal Medicine

## 2013-06-02 ENCOUNTER — Encounter: Payer: Self-pay | Admitting: Internal Medicine

## 2013-06-02 VITALS — BP 110/72 | HR 80 | Temp 97.9°F | Resp 16 | Ht 70.5 in | Wt 201.0 lb

## 2013-06-02 DIAGNOSIS — R0683 Snoring: Secondary | ICD-10-CM

## 2013-06-02 DIAGNOSIS — Z7902 Long term (current) use of antithrombotics/antiplatelets: Secondary | ICD-10-CM | POA: Insufficient documentation

## 2013-06-02 DIAGNOSIS — R7309 Other abnormal glucose: Secondary | ICD-10-CM

## 2013-06-02 DIAGNOSIS — E785 Hyperlipidemia, unspecified: Secondary | ICD-10-CM

## 2013-06-02 DIAGNOSIS — D485 Neoplasm of uncertain behavior of skin: Secondary | ICD-10-CM

## 2013-06-02 DIAGNOSIS — R0989 Other specified symptoms and signs involving the circulatory and respiratory systems: Secondary | ICD-10-CM

## 2013-06-02 DIAGNOSIS — I471 Supraventricular tachycardia: Secondary | ICD-10-CM | POA: Insufficient documentation

## 2013-06-02 DIAGNOSIS — Z79899 Other long term (current) drug therapy: Secondary | ICD-10-CM

## 2013-06-02 DIAGNOSIS — R0609 Other forms of dyspnea: Secondary | ICD-10-CM

## 2013-06-02 DIAGNOSIS — I10 Essential (primary) hypertension: Secondary | ICD-10-CM

## 2013-06-02 DIAGNOSIS — E559 Vitamin D deficiency, unspecified: Secondary | ICD-10-CM

## 2013-06-02 DIAGNOSIS — I2510 Atherosclerotic heart disease of native coronary artery without angina pectoris: Secondary | ICD-10-CM | POA: Insufficient documentation

## 2013-06-02 LAB — CBC WITH DIFFERENTIAL/PLATELET
BASOS ABS: 0.1 10*3/uL (ref 0.0–0.1)
Basophils Relative: 1 % (ref 0–1)
EOS PCT: 4 % (ref 0–5)
Eosinophils Absolute: 0.2 10*3/uL (ref 0.0–0.7)
HEMATOCRIT: 45.8 % (ref 39.0–52.0)
HEMOGLOBIN: 16.1 g/dL (ref 13.0–17.0)
LYMPHS PCT: 30 % (ref 12–46)
Lymphs Abs: 1.6 10*3/uL (ref 0.7–4.0)
MCH: 29.4 pg (ref 26.0–34.0)
MCHC: 35.2 g/dL (ref 30.0–36.0)
MCV: 83.7 fL (ref 78.0–100.0)
MONO ABS: 0.4 10*3/uL (ref 0.1–1.0)
MONOS PCT: 8 % (ref 3–12)
NEUTROS ABS: 3 10*3/uL (ref 1.7–7.7)
Neutrophils Relative %: 57 % (ref 43–77)
Platelets: 190 10*3/uL (ref 150–400)
RBC: 5.47 MIL/uL (ref 4.22–5.81)
RDW: 13.2 % (ref 11.5–15.5)
WBC: 5.2 10*3/uL (ref 4.0–10.5)

## 2013-06-02 LAB — HEMOGLOBIN A1C
Hgb A1c MFr Bld: 5.5 % (ref <5.7)
MEAN PLASMA GLUCOSE: 111 mg/dL (ref <117)

## 2013-06-02 NOTE — Progress Notes (Signed)
Patient ID: Bryan [name], male   DOB: 1950-05-11, 64 y.o.   MRN: [id number]    This very nice 63 yo [name] presents for 3 month follow up with Hypertension, ASCAD, Hyperlipidemia, Pre-Diabetes and Vitamin D Deficiency. Patient reports wife concerned about his long-standing snoring w/o apnea, but apparent do ossacionally exhibit some generalized myoclonic twitches with entering the sleep cycle   HTN predates since 1990. BP has been controlled and today's BP: 110/72 mmHg. Patient has Hx/o new onset  Angina in 1990 when he presented with an acute SEMI rescued by [facility].  Also he had an ablation in 2004 for pSVT. Patient denies any cardiac type chest pain, palpitations, dyspnea/orthopnea/PND, dizziness, claudication, or dependent edema.   Hyperlipidemia is not totally controlled with diet & meds. Last Cholesterol was 195 , Triglycerides were 412, HDL 40 and LDL  Not calc in Jan 2015  due to high Trig, but LDL measured 62 in Oct 2014 when Trig were 174. Patient denies myalgias or other med SE's.    Also, the patient is screened for PreDiabetes and last A1c was 5.6% and insulin 11 in Oct 2015. Patient denies any symptoms of reactive hypoglycemia, diabetic polys, paresthesias or visual blurring.   Further, Patient has history of Vitamin D Deficiency with last vitamin D of 81 in Oct 2014. Patient supplements vitamin D without any suspected side-effects.    Medication List     ALPRAZolam 1 MG tablet  Commonly known as:  XANAX  take 1 tablet by mouth three times a day if needed     BYSTOLIC PO  Take by mouth.     clonazePAM 2 MG tablet  Commonly known as:  KLONOPIN  Take 1 tablet (2 mg total) by mouth at bedtime.     clopidogrel 75 MG tablet  Commonly known as:  PLAVIX  Take 75 mg by mouth daily.     LISINOPRIL PO  Take 10 mg by mouth daily.     rosuvastatin 10 MG tablet  Commonly known as:  CRESTOR  Take 1 tablet (10 mg total) by mouth daily.     Vitamin D (Ergocalciferol) 50000 UNITS Caps  capsule  Commonly known as:  DRISDOL  take 1 capsule by mouth once daily     ZETIA 10 MG tablet  Generic drug:  ezetimibe  take 1 tablet by mouth once daily       Allergies  Allergen Reactions  . Amoxicillin Hives and Swelling  . Lunesta [Eszopiclone]     Restless legs  . Penicillins Swelling   PMHx:   Past Medical History  Diagnosis Date  . Heart attack   . Hyperlipidemia   . Hypertension   . Hernia   . Diverticulosis   . Vitamin D deficiency   . GERD (gastroesophageal reflux disease)    FHx:    Reviewed / unchanged  SHx:    Reviewed / unchanged   Systems Review: Constitutional: Denies fever, chills, wt changes, headaches, insomnia, fatigue, night sweats, change in appetite. Eyes: Denies redness, blurred vision, diplopia, discharge, itchy, watery eyes.  ENT: Denies discharge, congestion, post nasal drip, epistaxis, sore throat, earache, hearing loss, dental pain, tinnitus, vertigo, sinus pain, snoring.  CV: Denies chest pain, palpitations, irregular heartbeat, syncope, dyspnea, diaphoresis, orthopnea, PND, claudication or edema. Respiratory: denies cough, dyspnea, DOE, pleurisy, hoarseness, laryngitis, wheezing.  Gastrointestinal: Denies dysphagia, odynophagia, heartburn, reflux, water brash, abdominal pain or cramps, nausea, vomiting, bloating, diarrhea, constipation, hematemesis, melena, hematochezia  or hemorrhoids. Genitourinary: Denies dysuria, frequency,  urgency, nocturia, hesitancy, discharge, hematuria or flank pain. Musculoskeletal: Denies arthralgias, myalgias, stiffness, jt. swelling, pain, limping or strain/sprain.  Skin: Denies pruritus, rash, hives, warts, acne, eczema or change in skin lesion(s). Neuro: No weakness, tremor, incoordination, spasms, paresthesia or pain. Psychiatric: Denies confusion, memory loss or sensory loss. Endo: Denies change in weight, skin or hair change.  Heme/Lymph: No excessive bleeding, bruising or enlarged lymph  nodes.  Exam:  BP 110/72  P 80  T 97.9 F   R 16  Ht 5' 10.5"   Wt 201 lb  BMI 28.42 kg/m2  Appears well nourished - in no distress. Eyes: PERRLA, EOMs, conjunctiva no swelling or erythema. Sinuses: No frontal/maxillary tenderness ENT/Mouth: EAC's clear, TM's nl w/o erythema, bulging. Nares clear w/o erythema, swelling, exudates. Oropharynx clear without erythema or exudates. Oral hygiene is good. Tongue normal, non obstructing. Hearing intact.  Neck: Supple. Thyroid nl. Car 2+/2+ without bruits, nodes or JVD. Chest: Respirations nl with BS clear & equal w/o rales, rhonchi, wheezing or stridor.  Cor: Heart sounds normal w/ regular rate and rhythm without sig. murmurs, gallops, clicks, or rubs. Peripheral pulses normal and equal  without edema.  Abdomen: Soft & bowel sounds normal. Non-tender w/o guarding, rebound, hernias, masses, or organomegaly.  Lymphatics: Unremarkable.  Musculoskeletal: Full ROM all peripheral extremities, joint stability, 5/5 strength, and normal gait.  Skin: Warm, dry without exposed rashes  or ecchymosis apparent. Skin lesions of Rt hand an Lt thigh below. Neuro: Cranial nerves intact, reflexes equal bilaterally. Sensory-motor testing grossly intact. Tendon reflexes grossly intact.  Pysch: Alert & oriented x 3. Insight and judgement nl & appropriate. No ideations.  Assessment and Plan:  1. Hypertension - Continue monitor blood pressure at home. Continue diet/meds same.  2. Hyperlipidemia - Continue diet/meds, exercise,& lifestyle modifications. Continue monitor periodic cholesterol/liver & renal functions   3. Pre-diabetes/Insulin Resistance -screening - Continue diet, exercise, lifestyle modifications. Monitor appropriate labs.  4. Vitamin D Deficiency - Continue supplementation.  5- Skin lesions of Unknown Significance  6- Snoring  Recommended regular exercise, BP monitoring, weight control, and discussed med and SE's. Recommended labs to assess and  monitor clinical status. Further disposition pending results of labs. Also will refer to Dr Augustina Mood for consideration of an oral device for airway competence to prevent/reduce snoring.  --------------------------------------------------------------------------------------------------------------------- PROCEDURE: 17000. 17003 x 2  There is a 4 x 4 mm firm white to pink lesion of the dorsal rt hand . There are 2 similar lesions of the dorsal proximal lt thigh and the lateral mid lt thigh. With informed consent and after routine aseptic prep with isopropyl alcohol and local anesthesia with Marcaine 0.5% w/epi the lesions were sharply excised by shave excisional Bx technique and then wound bases were hyfrecated for hemostasis and fulguration of any remnant lesion fragments. Sterile bandages were applied. Patient was instructed in po wound care and advised to observe for any lesion recurrence after healing.

## 2013-06-02 NOTE — Patient Instructions (Signed)

## 2013-06-03 LAB — LIPID PANEL
Cholesterol: 152 mg/dL (ref 0–200)
HDL: 45 mg/dL (ref >39)
LDL CALC: 52 mg/dL (ref 0–99)
TRIGLYCERIDES: 276 mg/dL — AB (ref <150)
Total CHOL/HDL Ratio: 3.4 Ratio
VLDL: 55 mg/dL — AB (ref 0–40)

## 2013-06-03 LAB — HEPATIC FUNCTION PANEL
ALBUMIN: 4.1 g/dL (ref 3.5–5.2)
ALK PHOS: 57 U/L (ref 39–117)
ALT: 33 U/L (ref 0–53)
AST: 25 U/L (ref 0–37)
BILIRUBIN INDIRECT: 0.6 mg/dL (ref 0.2–1.2)
Bilirubin, Direct: 0.1 mg/dL (ref 0.0–0.3)
TOTAL PROTEIN: 6.7 g/dL (ref 6.0–8.3)
Total Bilirubin: 0.7 mg/dL (ref 0.2–1.2)

## 2013-06-03 LAB — BASIC METABOLIC PANEL WITH GFR
BUN: 9 mg/dL (ref 6–23)
CHLORIDE: 103 meq/L (ref 96–112)
CO2: 23 meq/L (ref 19–32)
CREATININE: 0.94 mg/dL (ref 0.50–1.35)
Calcium: 9.5 mg/dL (ref 8.4–10.5)
GFR, Est African American: >89 mL/min
GFR, Est Non African American: 86 mL/min
GLUCOSE: 85 mg/dL (ref 70–99)
POTASSIUM: 4 meq/L (ref 3.5–5.3)
Sodium: 137 mEq/L (ref 135–145)

## 2013-06-03 LAB — TSH: TSH: 1.598 u[IU]/mL (ref 0.350–4.500)

## 2013-06-03 LAB — MAGNESIUM: Magnesium: 1.7 mg/dL (ref 1.5–2.5)

## 2013-06-03 LAB — INSULIN, FASTING: INSULIN FASTING, SERUM: 16 u[IU]/mL (ref 3–28)

## 2013-06-03 LAB — VITAMIN D 25 HYDROXY (VIT D DEFICIENCY, FRACTURES): Vit D, 25-Hydroxy: 109 ng/mL — ABNORMAL HIGH (ref 30–89)

## 2013-06-07 ENCOUNTER — Other Ambulatory Visit: Payer: Self-pay | Admitting: *Deleted

## 2013-06-07 MED ORDER — NEBIVOLOL HCL 10 MG PO TABS: 10.0000 mg | ORAL_TABLET | Freq: Every day | ORAL | Status: DC

## 2013-09-08 ENCOUNTER — Other Ambulatory Visit: Payer: Self-pay | Admitting: Physician Assistant

## 2013-09-08 MED ORDER — CLONAZEPAM 2 MG PO TABS: 2.0000 mg | ORAL_TABLET | Freq: Every day | ORAL | Status: DC

## 2013-09-23 ENCOUNTER — Telehealth: Payer: Self-pay | Admitting: *Deleted

## 2013-09-23 MED ORDER — PREDNISONE 20 MG PO TABS: 20.0000 mg | ORAL_TABLET | Freq: Every day | ORAL | Status: DC

## 2013-09-23 MED ORDER — HYDROCODONE-ACETAMINOPHEN 5-325 MG PO TABS
ORAL_TABLET | ORAL | Status: DC
Start: 2013-09-23 — End: 2014-10-02

## 2013-09-23 MED ORDER — AZITHROMYCIN 250 MG PO TABS: ORAL_TABLET | ORAL | Status: AC

## 2013-09-23 NOTE — Telephone Encounter (Signed)
Patient called with c/o URI symptoms increasing in severity.  Increasing chest congestion with wheezing and cough, non-productive.  Per Dr. Idell Pickles orders Rx sent into pharmacy for patient and advised f/u ov if no relief with symptoms.

## 2013-10-06 ENCOUNTER — Other Ambulatory Visit: Payer: Self-pay | Admitting: *Deleted

## 2013-10-06 MED ORDER — EZETIMIBE 10 MG PO TABS: ORAL_TABLET | ORAL | Status: DC

## 2013-10-13 ENCOUNTER — Ambulatory Visit (INDEPENDENT_AMBULATORY_CARE_PROVIDER_SITE_OTHER): Payer: BC Managed Care – PPO | Admitting: Internal Medicine

## 2013-10-13 ENCOUNTER — Encounter (INDEPENDENT_AMBULATORY_CARE_PROVIDER_SITE_OTHER): Payer: Self-pay

## 2013-10-13 ENCOUNTER — Encounter: Payer: Self-pay | Admitting: Internal Medicine

## 2013-10-13 VITALS — BP 116/76 | HR 72 | Temp 98.2°F | Resp 16 | Ht 70.0 in | Wt 206.0 lb

## 2013-10-13 DIAGNOSIS — Z111 Encounter for screening for respiratory tuberculosis: Secondary | ICD-10-CM

## 2013-10-13 DIAGNOSIS — E559 Vitamin D deficiency, unspecified: Secondary | ICD-10-CM

## 2013-10-13 DIAGNOSIS — Z23 Encounter for immunization: Secondary | ICD-10-CM

## 2013-10-13 DIAGNOSIS — Z0001 Encounter for general adult medical examination with abnormal findings: Secondary | ICD-10-CM

## 2013-10-13 DIAGNOSIS — R7303 Prediabetes: Secondary | ICD-10-CM

## 2013-10-13 DIAGNOSIS — R6889 Other general symptoms and signs: Secondary | ICD-10-CM

## 2013-10-13 DIAGNOSIS — I10 Essential (primary) hypertension: Secondary | ICD-10-CM

## 2013-10-13 DIAGNOSIS — Z1212 Encounter for screening for malignant neoplasm of rectum: Secondary | ICD-10-CM

## 2013-10-13 DIAGNOSIS — Z113 Encounter for screening for infections with a predominantly sexual mode of transmission: Secondary | ICD-10-CM

## 2013-10-13 DIAGNOSIS — R7989 Other specified abnormal findings of blood chemistry: Secondary | ICD-10-CM

## 2013-10-13 DIAGNOSIS — R7309 Other abnormal glucose: Secondary | ICD-10-CM

## 2013-10-13 DIAGNOSIS — R945 Abnormal results of liver function studies: Secondary | ICD-10-CM

## 2013-10-13 DIAGNOSIS — Z125 Encounter for screening for malignant neoplasm of prostate: Secondary | ICD-10-CM

## 2013-10-13 DIAGNOSIS — I2510 Atherosclerotic heart disease of native coronary artery without angina pectoris: Secondary | ICD-10-CM

## 2013-10-13 DIAGNOSIS — E785 Hyperlipidemia, unspecified: Secondary | ICD-10-CM

## 2013-10-13 LAB — CBC WITH DIFFERENTIAL/PLATELET
BASOS ABS: 0.1 10*3/uL (ref 0.0–0.1)
Basophils Relative: 1 % (ref 0–1)
EOS PCT: 5 % (ref 0–5)
Eosinophils Absolute: 0.3 10*3/uL (ref 0.0–0.7)
HCT: 43.4 % (ref 39.0–52.0)
Hemoglobin: 15.4 g/dL (ref 13.0–17.0)
Lymphocytes Relative: 32 % (ref 12–46)
Lymphs Abs: 1.7 10*3/uL (ref 0.7–4.0)
MCH: 29.3 pg (ref 26.0–34.0)
MCHC: 35.5 g/dL (ref 30.0–36.0)
MCV: 82.7 fL (ref 78.0–100.0)
Monocytes Absolute: 0.5 10*3/uL (ref 0.1–1.0)
Monocytes Relative: 10 % (ref 3–12)
Neutro Abs: 2.7 10*3/uL (ref 1.7–7.7)
Neutrophils Relative %: 52 % (ref 43–77)
Platelets: 204 10*3/uL (ref 150–400)
RBC: 5.25 MIL/uL (ref 4.22–5.81)
RDW: 13.4 % (ref 11.5–15.5)
WBC: 5.2 10*3/uL (ref 4.0–10.5)

## 2013-10-13 NOTE — Progress Notes (Signed)
Patient ID: Bryan [name], male   DOB: Jan 08, 1950, 63 y.o.   MRN: [id number]  Annual Screening Comprehensive Examination  This very nice 63 y.o.[name] presents for complete physical.  Patient has been followed for HTN, ASCAD s/p Stent, Prediabetes, Hyperlipidemia and Vitamin D Deficiency.   HTN predates since 1990 and he did well until he presented with ACS in 2003 and was stented . In 2004 he had an ablation at [facility] for pSVT. Patient's BP has been controlled at home.Today's BP: 116/76 mmHg. Patient denies any cardiac symptoms as chest pain, palpitations, shortness of breath, dizziness or ankle swelling.   Patient's hyperlipidemia is controlled with diet and medications. Patient denies myalgias or other medication SE's. Last cholesterols were  At goalTotal Chol 152; HDL 45; LDL 52; but with an elevated Trig 276 on 06/02/2013.   Patient has been screened for prediabetes  and patient denies reactive hypoglycemic symptoms, visual blurring, diabetic polys or paresthesias. Last A1c was  5.5% on 06/02/2013.   Finally, patient has history of Vitamin D Deficiency of 26 in 2008 and last vitamin D was 109 on 06/02/2013 and dose wa tapered. Medication Sig  . clonazePAM 2 MG tablet Take 1 tablet (2 mg total) by mouth at bedtime.  . clopidogrel (PLAVIX) 75 MG tablet Take 75 mg by mouth daily.   Marland Kitchen ezetimibe (ZETIA) 10 MG tablet take 1 tablet by mouth once daily  . NORCO 5-325 MG  Take 1/2 to 1 pill q 3-4 hours prn cough  . LISINOPRIL PO Take 10 mg by mouth daily.   . nebivolol (BYSTOLIC) 10 MG tablet Take 1 tablet (10 mg total) by mouth daily.  . predniSONE  20 MG tablet Take 1 tablet (20 mg total) by mouth daily.  . rosuvastatin 10 MG tablet Take 1 tablet (10 mg total) by mouth daily.  . Vitamin D (DRISDOL) 50,000 UNITS CAP take 1 capsule by mouth once daily   Allergies  Allergen Reactions  . Amoxicillin Hives and Swelling  . Lunesta [Eszopiclone]     Restless legs  . Penicillins Swelling   Past Medical  History  Diagnosis Date  . Heart attack   . Hyperlipidemia   . Hypertension   . Hernia   . Diverticulosis   . Vitamin D deficiency   . GERD (gastroesophageal reflux disease)    Past Surgical History  Procedure Laterality Date  . Appendectomy    . Heart catherization    . Achilles tendon repair      left  . Cardiac electrophysiology mapping and ablation     Family History  Problem Relation Age of Onset  . Colon cancer Mother     malignant polyp of rectosigmoid colon  . Hypothyroidism Mother   . Irritable bowel syndrome Mother   . Cancer Father     brain   History   Social History  . Marital Status: Married    Spouse Name: N/A    Number of Children: N/A  . Years of Education: N/A   Occupational History  . Sales   Social History Main Topics  . Smoking status: Never Smoker   . Smokeless tobacco: Never Used  . Alcohol Use: No     Comment: rarely  . Drug Use: No  . Sexual Activity: Not on file    ROS Constitutional: Denies fever, chills, weight loss/gain, headaches, insomnia, fatigue, night sweats or change in appetite. Eyes: Denies redness, blurred vision, diplopia, discharge, itchy or watery eyes.  ENT: Denies discharge, congestion, post  nasal drip, epistaxis, sore throat, earache, hearing loss, dental pain, Tinnitus, Vertigo, Sinus pain or snoring.  Cardio: Denies chest pain, palpitations, irregular heartbeat, syncope, dyspnea, diaphoresis, orthopnea, PND, claudication or edema Respiratory: denies cough, dyspnea, DOE, pleurisy, hoarseness, laryngitis or wheezing.  Gastrointestinal: Denies dysphagia, heartburn, reflux, water brash, pain, cramps, nausea, vomiting, bloating, diarrhea, constipation, hematemesis, melena, hematochezia, jaundice or hemorrhoids Genitourinary: Denies dysuria, frequency, urgency, nocturia, hesitancy, discharge, hematuria or flank pain Musculoskeletal: Denies arthralgia, myalgia, stiffness, Jt. Swelling, pain, limp or strain/sprain. Denies  Falls. Skin: Denies puritis, rash, hives, warts, acne, eczema or change in skin lesion Neuro: No weakness, tremor, incoordination, spasms, paresthesia or pain Psychiatric: Denies confusion, memory loss or sensory loss. Denies Depression. Endocrine: Denies change in weight, skin, hair change, nocturia, and paresthesia, diabetic polys, visual blurring or hyper / hypo glycemic episodes.  Heme/Lymph: No excessive bleeding, bruising or enlarged lymph nodes.  Physical Exam  BP 116/76  Pulse 72  Temp 98.2 F   Resp 16  Ht 5\' 10"    Wt 206 lb   BMI 29.56   General Appearance: Well nourished, in no apparent distress. Eyes: PERRLA, EOMs, conjunctiva no swelling or erythema, normal fundi and vessels. Sinuses: No frontal/maxillary tenderness ENT/Mouth: EACs patent / TMs  nl. Nares clear without erythema, swelling, mucoid exudates. Oral hygiene is good. No erythema, swelling, or exudate. Tongue normal, non-obstructing. Tonsils not swollen or erythematous. Hearing normal.  Neck: Supple, thyroid normal. No bruits, nodes or JVD. Respiratory: Respiratory effort normal.  BS equal and clear bilateral without rales, rhonci, wheezing or stridor. Cardio: Heart sounds are normal with regular rate and rhythm and no murmurs, rubs or gallops. Peripheral pulses are normal and equal bilaterally without edema. No aortic or femoral bruits. Chest: symmetric with normal excursions and percussion.  Abdomen: Flat, soft, with bowl sounds. Nontender, no guarding, rebound, hernias, masses, or organomegaly.  Lymphatics: Non tender without lymphadenopathy.  Genitourinary: No hernias.Testes nl. DRE - prostate nl for age - smooth & firm w/o nodules. Musculoskeletal: Full ROM all peripheral extremities, joint stability, 5/5 strength, and normal gait. Skin: Warm and dry without rashes, lesions, cyanosis, clubbing or  ecchymosis.  Neuro: Cranial nerves intact, reflexes equal bilaterally. Normal muscle tone, no cerebellar symptoms.  Sensation intact.  Pysch: Awake and oriented X 3with normal affect, insight and judgment appropriate.   Assessment and Plan 1. Annual Screening Examination 2. Hypertension  3. Hyperlipidemia 4. Pre Diabetes 5. Vitamin D Deficiency   Continue prudent diet as discussed, weight control, BP monitoring, regular exercise, and medications as discussed.  Discussed med effects and SE's. Routine screening labs and tests as requested with regular follow-up as recommended.

## 2013-10-13 NOTE — Patient Instructions (Signed)
Recommend the book "The END of DIETING" by Dr Baker Janus   and the book "The END of DIABETES " by Dr Excell Seltzer  At [email or web address] - get book & Audio CD's      Being diabetic has a  300% increased risk for heart attack, stroke, cancer, and alzheimer- type vascular dementia. It is very important that you work harder with diet by avoiding all foods that are white except chicken & fish. Avoid white rice (brown & wild rice is OK), white potatoes (sweetpotatoes in moderation is OK), White bread or wheat bread or anything made out of white flour like bagels, donuts, rolls, buns, biscuits, cakes, pastries, cookies, pizza crust, and pasta (made from white flour & egg whites) - vegetarian pasta or spinach or wheat pasta is OK. Multigrain breads like Arnold's or [employer], or multigrain sandwich thins or flatbreads.  Diet, exercise and weight loss can reverse and cure diabetes in the early stages.  Diet, exercise and weight loss is very important in the control and prevention of complications of diabetes which affects every system in your body, ie. Brain - dementia/stroke, eyes - glaucoma/blindness, heart - heart attack/heart failure, kidneys - dialysis, stomach - gastric paralysis, intestines - malabsorption, nerves - severe painful neuritis, circulation - gangrene & loss of a leg(s), and finally cancer and Alzheimers.    I recommend avoid fried & greasy foods,  sweets/candy, white rice (brown or wild rice or Quinoa is OK), white potatoes (sweet potatoes are OK) - anything made from white flour - bagels, doughnuts, rolls, buns, biscuits,white and wheat breads, pizza crust and traditional pasta made of white flour & egg white(vegetarian pasta or spinach or wheat pasta is OK).  Multi-grain bread is OK - like multi-grain flat bread or sandwich thins. Avoid alcohol in excess. Exercise is also important.    Eat all the vegetables you want - avoid meat, especially red meat and dairy - especially cheese.  Cheese  is the most concentrated form of trans-fats which is the worst thing to clog up our arteries. Veggie cheese is OK which can be found in the fresh produce section at [employer] or [employer] or [employer]  Preventive Care for Adults A healthy lifestyle and preventive care can promote health and wellness. Preventive health guidelines for men include the following key practices:  A routine yearly physical is a good way to check with your health care provider about your health and preventative screening. It is a chance to share any concerns and updates on your health and to receive a thorough exam.  Visit your dentist for a routine exam and preventative care every 6 months. Brush your teeth twice a day and floss once a day. Good oral hygiene prevents tooth decay and gum disease.  The frequency of eye exams is based on your age, health, family medical history, use of contact lenses, and other factors. Follow your health care provider's recommendations for frequency of eye exams.  Eat a healthy diet. Foods such as vegetables, fruits, whole grains, low-fat dairy products, and lean protein foods contain the nutrients you need without too many calories. Decrease your intake of foods high in solid fats, added sugars, and salt. Eat the right amount of calories for you.Get information about a proper diet from your health care provider, if necessary.  Regular physical exercise is one of the most important things you can do for your health. Most adults should get at least 150 minutes of moderate-intensity exercise (any activity that  increases your heart rate and causes you to sweat) each week. In addition, most adults need muscle-strengthening exercises on 2 or more days a week.  Maintain a healthy weight. The body mass index (BMI) is a screening tool to identify possible weight problems. It provides an estimate of body fat based on height and weight. Your health care provider can find your BMI and can help you  achieve or maintain a healthy weight.For adults 20 years and older:  A BMI below 18.5 is considered underweight.  A BMI of 18.5 to 24.9 is normal.  A BMI of 25 to 29.9 is considered overweight.  A BMI of 30 and above is considered obese.  Maintain normal blood lipids and cholesterol levels by exercising and minimizing your intake of saturated fat. Eat a balanced diet with plenty of fruit and vegetables. Blood tests for lipids and cholesterol should begin at age 20 and be repeated every 5 years. If your lipid or cholesterol levels are high, you are over 50, or you are at high risk for heart disease, you may need your cholesterol levels checked more frequently.Ongoing high lipid and cholesterol levels should be treated with medicines if diet and exercise are not working.  If you smoke, find out from your health care provider how to quit. If you do not use tobacco, do not start.  Lung cancer screening is recommended for adults aged 72-80 years who are at high risk for developing lung cancer because of a history of smoking. A yearly low-dose CT scan of the lungs is recommended for people who have at least a 30-pack-year history of smoking and are a current smoker or have quit within the past 15 years. A pack year of smoking is smoking an average of 1 pack of cigarettes a day for 1 year (for example: 1 pack a day for 30 years or 2 packs a day for 15 years). Yearly screening should continue until the smoker has stopped smoking for at least 15 years. Yearly screening should be stopped for people who develop a health problem that would prevent them from having lung cancer treatment.  If you choose to drink alcohol, do not have more than 2 drinks per day. One drink is considered to be 12 ounces (355 mL) of beer, 5 ounces (148 mL) of wine, or 1.5 ounces (44 mL) of liquor.  Avoid use of street drugs. Do not share needles with anyone. Ask for help if you need support or instructions about stopping the use of  drugs.  High blood pressure causes heart disease and increases the risk of stroke. Your blood pressure should be checked at least every 1-2 years. Ongoing high blood pressure should be treated with medicines, if weight loss and exercise are not effective.  If you are 28-64 years old, ask your health care provider if you should take aspirin to prevent heart disease.  Diabetes screening involves taking a blood sample to check your fasting blood sugar level. This should be done once every 3 years, after age 13, if you are within normal weight and without risk factors for diabetes. Testing should be considered at a younger age or be carried out more frequently if you are overweight and have at least 1 risk factor for diabetes.  Colorectal cancer can be detected and often prevented. Most routine colorectal cancer screening begins at the age of 78 and continues through age 56. However, your health care provider may recommend screening at an earlier age if you have risk  factors for colon cancer. On a yearly basis, your health care provider may provide home test kits to check for hidden blood in the stool. Use of a small camera at the end of a tube to directly examine the colon (sigmoidoscopy or colonoscopy) can detect the earliest forms of colorectal cancer. Talk to your health care provider about this at age 48, when routine screening begins. Direct exam of the colon should be repeated every 5-10 years through age 60, unless early forms of precancerous polyps or small growths are found.  People who are at an increased risk for hepatitis B should be screened for this virus. You are considered at high risk for hepatitis B if:  You were born in a country where hepatitis B occurs often. Talk with your health care provider about which countries are considered high risk.  Your parents were born in a high-risk country and you have not received a shot to protect against hepatitis B (hepatitis B vaccine).  You have  HIV or AIDS.  You use needles to inject street drugs.  You live with, or have sex with, someone who has hepatitis B.  You are a man who has sex with other men (MSM).  You get hemodialysis treatment.  You take certain medicines for conditions such as cancer, organ transplantation, and autoimmune conditions.  Hepatitis C blood testing is recommended for all people born from [birth year] through 1965 and any individual with known risks for hepatitis C.  Practice safe sex. Use condoms and avoid high-risk sexual practices to reduce the spread of sexually transmitted infections (STIs). STIs include gonorrhea, chlamydia, syphilis, trichomonas, herpes, HPV, and human immunodeficiency virus (HIV). Herpes, HIV, and HPV are viral illnesses that have no cure. They can result in disability, cancer, and death.  If you are at risk of being infected with HIV, it is recommended that you take a prescription medicine daily to prevent HIV infection. This is called preexposure prophylaxis (PrEP). You are considered at risk if:  You are a man who has sex with other men (MSM) and have other risk factors.  You are a heterosexual man, are sexually active, and are at increased risk for HIV infection.  You take drugs by injection.  You are sexually active with a partner who has HIV.  Talk with your health care provider about whether you are at high risk of being infected with HIV. If you choose to begin PrEP, you should first be tested for HIV. You should then be tested every 3 months for as long as you are taking PrEP.  A one-time screening for abdominal aortic aneurysm (AAA) and surgical repair of large AAAs by ultrasound are recommended for men ages 51 to 11 years who are current or former smokers.  Healthy men should no longer receive prostate-specific antigen (PSA) blood tests as part of routine cancer screening. Talk with your health care provider about prostate cancer screening.  Testicular cancer screening is  not recommended for adult males who have no symptoms. Screening includes self-exam, a health care provider exam, and other screening tests. Consult with your health care provider about any symptoms you have or any concerns you have about testicular cancer.  Use sunscreen. Apply sunscreen liberally and repeatedly throughout the day. You should seek shade when your shadow is shorter than you. Protect yourself by wearing long sleeves, pants, a wide-brimmed hat, and sunglasses year round, whenever you are outdoors.  Once a month, do a whole-body skin exam, using a mirror to look  at the skin on your back. Tell your health care provider about new moles, moles that have irregular borders, moles that are larger than a pencil eraser, or moles that have changed in shape or color.  Stay current with required vaccines (immunizations).  Influenza vaccine. All adults should be immunized every year.  Tetanus, diphtheria, and acellular pertussis (Td, Tdap) vaccine. An adult who has not previously received Tdap or who does not know his vaccine status should receive 1 dose of Tdap. This initial dose should be followed by tetanus and diphtheria toxoids (Td) booster doses every 10 years. Adults with an unknown or incomplete history of completing a 3-dose immunization series with Td-containing vaccines should begin or complete a primary immunization series including a Tdap dose. Adults should receive a Td booster every 10 years.  Varicella vaccine. An adult without evidence of immunity to varicella should receive 2 doses or a second dose if he has previously received 1 dose.  Human papillomavirus (HPV) vaccine. Males aged 29-21 years who have not received the vaccine previously should receive the 3-dose series. Males aged 22-26 years may be immunized. Immunization is recommended through the age of 26 years for any male who has sex with males and did not get any or all doses earlier. Immunization is recommended for any  person with an immunocompromised condition through the age of 29 years if he did not get any or all doses earlier. During the 3-dose series, the second dose should be obtained 4-8 weeks after the first dose. The third dose should be obtained 24 weeks after the first dose and 16 weeks after the second dose.  Zoster vaccine. One dose is recommended for adults aged 38 years or older unless certain conditions are present.  Measles, mumps, and rubella (MMR) vaccine. Adults born before [birth year] generally are considered immune to measles and mumps. Adults born in [birth year] or later should have 1 or more doses of MMR vaccine unless there is a contraindication to the vaccine or there is laboratory evidence of immunity to each of the three diseases. A routine second dose of MMR vaccine should be obtained at least 28 days after the first dose for students attending postsecondary schools, health care workers, or international travelers. People who received inactivated measles vaccine or an unknown type of measles vaccine during 1963-1967 should receive 2 doses of MMR vaccine. People who received inactivated mumps vaccine or an unknown type of mumps vaccine before 1979 and are at high risk for mumps infection should consider immunization with 2 doses of MMR vaccine. Unvaccinated health care workers born before [birth year] who lack laboratory evidence of measles, mumps, or rubella immunity or laboratory confirmation of disease should consider measles and mumps immunization with 2 doses of MMR vaccine or rubella immunization with 1 dose of MMR vaccine.  Pneumococcal 13-valent conjugate (PCV13) vaccine. When indicated, a person who is uncertain of his immunization history and has no record of immunization should receive the PCV13 vaccine. An adult aged 68 years or older who has certain medical conditions and has not been previously immunized should receive 1 dose of PCV13 vaccine. This PCV13 should be followed with a dose of pneumococcal  polysaccharide (PPSV23) vaccine. The PPSV23 vaccine dose should be obtained at least 8 weeks after the dose of PCV13 vaccine. An adult aged 95 years or older who has certain medical conditions and previously received 1 or more doses of PPSV23 vaccine should receive 1 dose of PCV13. The PCV13 vaccine dose should be obtained 1  or more years after the last PPSV23 vaccine dose.  Pneumococcal polysaccharide (PPSV23) vaccine. When PCV13 is also indicated, PCV13 should be obtained first. All adults aged 65 years and older should be immunized. An adult younger than age 65 years who has certain medical conditions should be immunized. Any person who resides in a nursing home or long-term care facility should be immunized. An adult smoker should be immunized. People with an immunocompromised condition and certain other conditions should receive both PCV13 and PPSV23 vaccines. People with human immunodeficiency virus (HIV) infection should be immunized as soon as possible after diagnosis. Immunization during chemotherapy or radiation therapy should be avoided. Routine use of PPSV23 vaccine is not recommended for American Indians, [place] Natives, or people younger than 65 years unless there are medical conditions that require PPSV23 vaccine. When indicated, people who have unknown immunization and have no record of immunization should receive PPSV23 vaccine. One-time revaccination 5 years after the first dose of PPSV23 is recommended for people aged 19-64 years who have chronic kidney failure, nephrotic syndrome, asplenia, or immunocompromised conditions. People who received 1-2 doses of PPSV23 before age 65 years should receive another dose of PPSV23 vaccine at age 65 years or later if at least 5 years have passed since the previous dose. Doses of PPSV23 are not needed for people immunized with PPSV23 at or after age 65 years.  Meningococcal vaccine. Adults with asplenia or persistent complement component deficiencies  should receive 2 doses of quadrivalent meningococcal conjugate (MenACWY-D) vaccine. The doses should be obtained at least 2 months apart. Microbiologists working with certain meningococcal bacteria, military recruits, people at risk during an outbreak, and people who travel to or live in countries with a high rate of meningitis should be immunized. A first-year college student up through age 21 years who is living in a residence hall should receive a dose if he did not receive a dose on or after his 16th birthday. Adults who have certain high-risk conditions should receive one or more doses of vaccine.  Hepatitis A vaccine. Adults who wish to be protected from this disease, have certain high-risk conditions, work with hepatitis A-infected animals, work in hepatitis A research labs, or travel to or work in countries with a high rate of hepatitis A should be immunized. Adults who were previously unvaccinated and who anticipate close contact with an international adoptee during the first 60 days after arrival in the [place] from a country with a high rate of hepatitis A should be immunized.  Hepatitis B vaccine. Adults should be immunized if they wish to be protected from this disease, have certain high-risk conditions, may be exposed to blood or other infectious body fluids, are household contacts or sex partners of hepatitis B positive people, are clients or workers in certain care facilities, or travel to or work in countries with a high rate of hepatitis B.  Haemophilus influenzae type b (Hib) vaccine. A previously unvaccinated person with asplenia or sickle cell disease or having a scheduled splenectomy should receive 1 dose of Hib vaccine. Regardless of previous immunization, a recipient of a hematopoietic stem cell transplant should receive a 3-dose series 6-12 months after his successful transplant. Hib vaccine is not recommended for adults with HIV infection. Preventive Service / Frequency Ages  40 to 64  Blood pressure check.** / Every 1 to 2 years.  Lipid and cholesterol check.** / Every 5 years beginning at age 20.  Lung cancer screening. / Every year if you are aged 55-80   55-80 years and have a 30-pack-year history of smoking and currently smoke or have quit within the past 15 years. Yearly screening is stopped once you have quit smoking for at least 15 years or develop a health problem that would prevent you from having lung cancer treatment.  Fecal occult blood test (FOBT) of stool. / Every year beginning at age 50 and continuing until age 13. You may not have to do this test if you get a colonoscopy every 10 years.  Flexible sigmoidoscopy** or colonoscopy.** / Every 5 years for a flexible sigmoidoscopy or every 10 years for a colonoscopy beginning at age 62 and continuing until age 83.  Hepatitis C blood test.** / For all people born from [birth year] through 1965 and any individual with known risks for hepatitis C.  Skin self-exam. / Monthly.  Influenza vaccine. / Every year.  Tetanus, diphtheria, and acellular pertussis (Tdap/Td) vaccine.** / Consult your health care provider. 1 dose of Td every 10 years.  Varicella vaccine.** / Consult your health care provider.  Zoster vaccine.** / 1 dose for adults aged 24 years or older.  Measles, mumps, rubella (MMR) vaccine.** / You need at least 1 dose of MMR if you were born in 1957 or later. You may also need a second dose.  Pneumococcal 13-valent conjugate (PCV13) vaccine.** / Consult your health care provider.  Pneumococcal polysaccharide (PPSV23) vaccine.** / 1 to 2 doses if you smoke cigarettes or if you have certain conditions.  Meningococcal vaccine.** / Consult your health care provider.  Hepatitis A vaccine.** / Consult your health care provider.  Hepatitis B vaccine.** / Consult your health care provider.  Haemophilus influenzae type b (Hib) vaccine.** / Consult your health care provider.

## 2013-10-14 ENCOUNTER — Telehealth: Payer: Self-pay

## 2013-10-14 LAB — HEPATIC FUNCTION PANEL
ALBUMIN: 4.3 g/dL (ref 3.5–5.2)
ALT: 26 U/L (ref 0–53)
AST: 21 U/L (ref 0–37)
Alkaline Phosphatase: 58 U/L (ref 39–117)
Bilirubin, Direct: 0.1 mg/dL (ref 0.0–0.3)
Indirect Bilirubin: 0.6 mg/dL (ref 0.2–1.2)
TOTAL PROTEIN: 6.5 g/dL (ref 6.0–8.3)
Total Bilirubin: 0.7 mg/dL (ref 0.2–1.2)

## 2013-10-14 LAB — URINALYSIS, MICROSCOPIC ONLY
BACTERIA UA: NONE SEEN
CRYSTALS: NONE SEEN
Casts: NONE SEEN
Squamous Epithelial / LPF: NONE SEEN

## 2013-10-14 LAB — HEMOGLOBIN A1C
HEMOGLOBIN A1C: 5.5 % (ref <5.7)
MEAN PLASMA GLUCOSE: 111 mg/dL (ref <117)

## 2013-10-14 LAB — BASIC METABOLIC PANEL WITH GFR
BUN: 15 mg/dL (ref 6–23)
CO2: 21 mEq/L (ref 19–32)
CREATININE: 0.98 mg/dL (ref 0.50–1.35)
Calcium: 9.2 mg/dL (ref 8.4–10.5)
Chloride: 106 mEq/L (ref 96–112)
GFR, EST NON AFRICAN AMERICAN: 82 mL/min
GFR, Est African American: >89 mL/min
Glucose, Bld: 85 mg/dL (ref 70–99)
Potassium: 3.9 mEq/L (ref 3.5–5.3)
Sodium: 138 mEq/L (ref 135–145)

## 2013-10-14 LAB — LIPID PANEL
Cholesterol: 135 mg/dL (ref 0–200)
HDL: 42 mg/dL (ref >39)
LDL CALC: 45 mg/dL (ref 0–99)
Total CHOL/HDL Ratio: 3.2 Ratio
Triglycerides: 239 mg/dL — ABNORMAL HIGH (ref <150)
VLDL: 48 mg/dL — ABNORMAL HIGH (ref 0–40)

## 2013-10-14 LAB — INSULIN, FASTING: INSULIN FASTING, SERUM: 8.8 u[IU]/mL (ref 2.0–19.6)

## 2013-10-14 LAB — MICROALBUMIN / CREATININE URINE RATIO
Creatinine, Urine: 150.1 mg/dL
MICROALB UR: 0.3 mg/dL (ref <2.0)
MICROALB/CREAT RATIO: 2 mg/g (ref 0.0–30.0)

## 2013-10-14 LAB — VITAMIN B12: Vitamin B-12: 433 pg/mL (ref 211–911)

## 2013-10-14 LAB — TESTOSTERONE: Testosterone: 419 ng/dL (ref 300–890)

## 2013-10-14 LAB — TSH: TSH: 1.971 u[IU]/mL (ref 0.350–4.500)

## 2013-10-14 LAB — MAGNESIUM: Magnesium: 1.8 mg/dL (ref 1.5–2.5)

## 2013-10-14 LAB — VITAMIN D 25 HYDROXY (VIT D DEFICIENCY, FRACTURES): Vit D, 25-Hydroxy: 102 ng/mL — ABNORMAL HIGH (ref 30–89)

## 2013-10-14 LAB — PSA: PSA: 0.74 ng/mL (ref <=4.00)

## 2013-10-14 NOTE — Telephone Encounter (Signed)
Message copied by Nadyne Coombes on Fri Oct 14, 2013 12:48 PM ------      Message from: [name] Pinto      Created: Fri Oct 14, 2013  8:24 AM       - All labs great       - Testosterone - good level       - Chol 135 / LDL 45 - excellent      - A1c Nl - no diabetes      - Vit D good       - keep up great work ------

## 2013-10-14 NOTE — Telephone Encounter (Signed)
Left message for patient to return call for lab results. 

## 2013-10-14 NOTE — Telephone Encounter (Signed)
error 

## 2013-10-14 NOTE — Telephone Encounter (Signed)
Message copied by Nadyne Coombes on Fri Oct 14, 2013 12:51 PM ------      Message from: [name] Pinto      Created: Fri Oct 14, 2013  8:24 AM       - All labs great       - Testosterone - good level       - Chol 135 / LDL 45 - excellent      - A1c Nl - no diabetes      - Vit D good       - keep up great work ------

## 2013-10-17 LAB — TB SKIN TEST
INDURATION: 0 mm
TB Skin Test: NEGATIVE

## 2013-10-19 ENCOUNTER — Other Ambulatory Visit: Payer: Self-pay | Admitting: Internal Medicine

## 2013-12-16 ENCOUNTER — Encounter: Payer: Self-pay | Admitting: Cardiology

## 2013-12-16 DIAGNOSIS — I493 Ventricular premature depolarization: Secondary | ICD-10-CM | POA: Insufficient documentation

## 2013-12-16 NOTE — Progress Notes (Signed)
Patient ID: Bryan [name], male   DOB: February 22, 1950, 63 y.o.   MRN: [id number]   Bryan [name], Rzepka  Date of visit:  12/16/2013 DOB:  January 30, 1950    Age:  63 yrs. Medical record number:  [id number]     Account number:  [id number] Primary Care Provider: [name] Pinto D ____________________________ CURRENT DIAGNOSES  1. Atherosclerotic heart disease of native coronary artery without angina pectoris  2. Hyperlipidemia, unspecified  3. Essential (primary) hypertension  4. Supraventricular tachycardia  5. Ventricular premature depolarization  6. Overweight  7. Old myocardial infarction  8. Presence of coronary angioplasty implant and graft  9. Supraventricular tachycardia ____________________________ ALLERGIES  Penicillins, Intolerance-unknown ____________________________ MEDICATIONS  1. nitroglycerin 0.4 mg Tablet, Sublingual, PRN  2. lisinopril 20 mg Tablet, 1 p.o. daily  3. Bystolic 10 mg Tablet, 1 p.o. daily  4. Zetia 10 mg tablet, 1 p.o. q.d.  5. clopidogrel 75 mg tablet, 1 p.o. q.d.  6. Crestor 10 mg tablet, 1 p.o. daily ____________________________ CHIEF COMPLAINTS  Followup of Atherosclerotic heart disease of native coronary artery without angina pectoris ____________________________ HISTORY OF PRESENT ILLNESS  Patient seen after a long absence. He has a history of coronary artery disease with previous stenting of the marginal branch in April 2003. He had residual disease involving the right coronary artery and the distal LAD at that time. He has done quite well over the past 3 years and has now changed jobs. He is complaining of a brief amount of chest discomfort. He describes it as a a small area in the midsternal area that he can pput his finger on it and lasts a few seconds and is relieved with expelling gas. He doesn't have angina and works out about 4 days a week. He has not had any recurrent atrial arrhythmias. He does have symptomatic PVCs but these have improved also. He is still  overweight but is trying to keep his weight down. He denies PND, orthopnea, syncope, or claudication. ____________________________ PAST HISTORY  Past Medical Illnesses:  hyperlipidemia, GERD, obesity;  Cardiovascular Illnesses:  S/P MI-subendocardial, CAD, history of PAT treated with ablation, arrhythmia-PVCs;  Surgical Procedures:  appendectomy, l ankle surg;  NYHA Classification:  I;  Canadian Angina Classification:  Class 0: Asymptomatic;  Cardiology Procedures-Invasive:  cardiac cath (left), stent OM 06 April 2001, ablation of AVNRT 12/21/02 [facility];  Cardiology Procedures-Noninvasive:  treadmill cardiolite April 2008, Stress regadenoson cardiolte June 2010;  Cardiac Cath Results:  normal Left main, moderate proximal disease, 80% stenosis distal LAD, 99% stenosis proximal OM 1, 60% stenosis mid RCA, 70% stenosis ostial mid PDA;  LVEF of 76% documented via nuclear study on 06/15/2008,   ____________________________ CARDIO-PULMONARY TEST DATES EKG Date:  12/16/2013;   Cardiac Cath Date:  04/19/2001;  Stent Placement Date: 04/19/2001;  Holter/Event Monitor Date: 04/14/2006;  Nuclear Study Date:  06/15/2008;  Chest Xray Date: 01/21/2011;   ____________________________ FAMILY HISTORY Brother -- Brother alive and well Brother -- Brother alive and well Father -- Father dead, Brain disorder Mother -- Mother alive with problem, Senile dementia Sister -- Sister alive and well Sister -- Sister alive and well ____________________________ SOCIAL HISTORY Alcohol Use:  socially;  Smoking:  used to smoke but quit Prior to 1980;  Diet:  regular diet;  Lifestyle:  divorced and remarried;  Exercise:  exercises daily;  Occupation:  [employer], Engineer, maintenance;  Residence:  lives with wife;   ____________________________ REVIEW OF SYSTEMS General:  denies recent weight change, fatique or change in exercise tolerance.  Respiratory: denies dyspnea, cough, wheezing or hemoptysis. Cardiovascular:  please  review HPI Abdominal: denies dyspepsia, GI bleeding, constipation, or diarrhea Genitourinary-Male: no dysuria, urgency, frequency, or nocturia  Musculoskeletal:  denies arthritis, venous insufficiency, or muscle weakness Neurological:  denies headaches, stroke, or TIA  ____________________________ PHYSICAL EXAMINATION VITAL SIGNS  Blood Pressure:  104/60 Sitting, Left arm, regular cuff  , 110/62 Standing, Left arm and regular cuff   Pulse:  78/min. Weight:  204.00 lbs. Height:  71"BMI: 28  Constitutional:  pleasant white male in no acute distress Skin:  warm and dry to touch, no apparent skin lesions, or masses noted. Head:  normocephalic, normal hair pattern, no masses or tenderness ENT:  ears, nose and throat reveal no gross abnormalities.  Dentition good. Neck:  supple, no masses, thyromegaly, JVD. Carotid pulses are full and equal bilaterally without bruits. Chest:  normal symmetry, clear to auscultation. Cardiac:  regular rhythm, normal S1 and S2, No S3 or S4, no murmurs, gallops or rubs detected. Abdomen:  abdomen soft,non-tender, no masses, no hepatospenomegaly, or aneurysm noted Peripheral Pulses:  the femoral,dorsalis pedis, and posterior tibial pulses are full and equal bilaterally with no bruits auscultated. Extremities & Back:  no deformities, clubbing, cyanosis, erythema or edema observed. Normal muscle strength and tone. Neurological:  no gross motor or sensory deficits noted, affect appropriate, oriented x3. ____________________________ MOST RECENT LIPID PANEL 12/16/13  CHOL TOTL 142 mg/dl, LDL 53 NM, HDL 40 mg/dl, TRIGLYCER 245 mg/dl and CHOL/HDL 3.6 (Calc) ____________________________ IMPRESSIONS/PLAN 1. Coronary artery disease with previous stenting of the circumflex and residual right coronary artery and LAD disease 2. Hyperlipidemia under excellent control 3. PVCs 4. Hypertension controlled  Recommendations:  I would like him to have a myocardial perfusion scan to  assess ischemia for the residual disease especially since he has some pain with atypical features still. He could lose a little bit more weight. Otherwise feeling well and recommended followup in one year. EKG is normal.  ____________________________ TODAYS ORDERS  1. 12 Lead EKG: Today  2. Treadmill 1 day Cardiolite: First Available                       ____________________________ Cardiology Physician:  Kerry Hough MD [facility]

## 2014-01-17 ENCOUNTER — Telehealth: Payer: Self-pay | Admitting: *Deleted

## 2014-01-17 NOTE — Telephone Encounter (Signed)
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

## 2014-01-22 ENCOUNTER — Other Ambulatory Visit: Payer: Self-pay | Admitting: Internal Medicine

## 2014-02-15 ENCOUNTER — Other Ambulatory Visit: Payer: Self-pay | Admitting: Internal Medicine

## 2014-03-07 NOTE — Progress Notes (Signed)
Patient ID: Bryan [name], male   DOB: Jan 31, 1950, 64 y.o.   MRN: [id number]    This very nice 64 y.o. [name] presents for 3 month follow up with Hypertension, ASCAD, Hyperlipidemia, Pre-Diabetes and Vitamin D Deficiency.    Patient is treated for HTN (1990) & BP has been controlled at home. In 2003 , patient had PTCA and Circ Stent and also has had ablation of an atrial focus of pAFib and has done well since. Today's BP: 104/66 mmHg. Patient has had no complaints of any cardiac type chest pain, palpitations, dyspnea/orthopnea/PND, dizziness, claudication, or dependent edema.   Hyperlipidemia is controlled with diet & meds. Patient denies myalgias or other med SE's. Last Lipids were Cholesterol, Total 135; HDL-C 42; LDL (calc) 45; Triglycerides 239* on 10/13/2013   Also, the patient has history of PreDiabetes and has had no symptoms of reactive hypoglycemia, diabetic polys, paresthesias or visual blurring.  Last A1c was  Hemoglobin-A1c 5.5% on 10/13/2013.   Further, the patient also has history of Vitamin D Deficiency and supplements vitamin D without any suspected side-effects. Last vitamin D was  VITD 102 on 10/13/2013  Medication Sig  . BYSTOLIC 10 MG tablet take 1 tablet by mouth once daily  . clonazePAM (KLONOPIN) 2 MG tablet Take 1 tablet (2 mg total) by mouth at bedtime.  . clopidogrel (PLAVIX) 75 MG tablet take 1 tablet by mouth once daily  . CRESTOR 10 MG tablet take 1 tablet by mouth once daily  . HYDROcodone-acetaminophen (NORCO) 5-325 MG per tablet Take 1/2 to 1 pill q 3-4 hours prn cough  . LISINOPRIL PO Take 10 mg by mouth daily.   . predniSONE (DELTASONE) 20 MG tablet Take 1 tablet (20 mg total) by mouth daily.  . Vitamin D, Ergocalciferol, (DRISDOL) 50000 UNITS CAPS capsule take 1 capsule by mouth once daily  . ZETIA 10 MG tablet take 1 tablet by mouth once daily   Allergies  Allergen Reactions  . Amoxicillin Hives and Swelling  . Lunesta [Eszopiclone]     Restless legs  .  Penicillins Swelling   PMHx:   Past Medical History  Diagnosis Date  . Hyperlipidemia   . Hypertension   . Hernia   . Diverticulosis   . Vitamin D deficiency   . GERD (gastroesophageal reflux disease)   . CAD (coronary artery disease), native coronary artery     Cath 04/19/2001  normal Left main, moderate proximal disease, 80% stenosis distal LAD, 99% stenosis proximal OM 1, 60% stenosis mid RCA, 70% stenosis ostial mid PDA  PCI with bare metal stent of OM1 same date 3.5 x 18 mm Multilink    . History of PSVT     Ablation done in 2004 at [facility]   . Symptomatic PVCs    Immunization History  Administered Date(s) Administered  . Influenza Split 10/13/2013  . Influenza,inj,quad, With Preservative 01/13/2013  . Influenza-Unspecified 10/07/2011  . PPD Test 10/13/2013  . Pneumococcal Conjugate-13 10/13/2013  . Td 01/06/2006  . Zoster 09/22/2011   Past Surgical History  Procedure Laterality Date  . Appendectomy    . Achilles tendon repair      left  . Cardiac electrophysiology mapping and ablation     FHx:    Reviewed / unchanged  SHx:    Reviewed / unchanged  Systems Review:  Constitutional: Denies fever, chills, wt changes, headaches, insomnia, fatigue, night sweats, change in appetite. Eyes: Denies redness, blurred vision, diplopia, discharge, itchy, watery eyes.  ENT: Denies discharge,  congestion, post nasal drip, epistaxis, sore throat, earache, hearing loss, dental pain, tinnitus, vertigo, sinus pain, snoring.  CV: Denies chest pain, palpitations, irregular heartbeat, syncope, dyspnea, diaphoresis, orthopnea, PND, claudication or edema. Respiratory: denies cough, dyspnea, DOE, pleurisy, hoarseness, laryngitis, wheezing.  Gastrointestinal: Denies dysphagia, odynophagia, heartburn, reflux, water brash, abdominal pain or cramps, nausea, vomiting, bloating, diarrhea, constipation, hematemesis, melena, hematochezia  or hemorrhoids. Genitourinary: Denies dysuria,  frequency, urgency, nocturia, hesitancy, discharge, hematuria or flank pain. Musculoskeletal: Denies arthralgias, myalgias, stiffness, jt. swelling, pain, limping or strain/sprain.  Skin: Denies pruritus, rash, hives, warts, acne, eczema or change in skin lesion(s). Neuro: No weakness, tremor, incoordination, spasms, paresthesia or pain. Psychiatric: Denies confusion, memory loss or sensory loss. Endo: Denies change in weight, skin or hair change.  Heme/Lymph: No excessive bleeding, bruising or enlarged lymph nodes.  Physical Exam  BP 104/66 mmHg  Pulse 68  Temp(Src) 97.7 F (36.5 C)  Resp 16  Ht 5\' 10"  (1.778 m)  Wt 204 lb 12.8 oz (92.897 kg)  BMI 29.39 kg/m2  Appears well nourished and in no distress. Eyes: PERRLA, EOMs, conjunctiva no swelling or erythema. Sinuses: No frontal/maxillary tenderness ENT/Mouth: EAC's clear, TM's nl w/o erythema, bulging. Nares clear w/o erythema, swelling, exudates. Oropharynx clear without erythema or exudates. Oral hygiene is good. Tongue normal, non obstructing. Hearing intact.  Neck: Supple. Thyroid nl. Car 2+/2+ without bruits, nodes or JVD. Chest: Respirations nl with BS clear & equal w/o rales, rhonchi, wheezing or stridor.  Cor: Heart sounds normal w/ regular rate and rhythm without sig. murmurs, gallops, clicks, or rubs. Peripheral pulses normal and equal  without edema.  Abdomen: Soft & bowel sounds normal. Non-tender w/o guarding, rebound, hernias, masses, or organomegaly.  Lymphatics: Unremarkable.  Musculoskeletal: Full ROM all peripheral extremities, joint stability, 5/5 strength, and normal gait.  Skin: Warm, dry without exposed rashes, lesions or ecchymosis apparent.  Neuro: Cranial nerves intact, reflexes equal bilaterally. Sensory-motor testing grossly intact. Tendon reflexes grossly intact.  Pysch: Alert & oriented x 3.  Insight and judgement nl & appropriate. No ideations.  Assessment and Plan:  1. Essential hypertension  -  TSH  2. Hyperlipidemia  - Lipid panel  3. Prediabetes  - Hemoglobin A1c - Insulin, fasting  4. Vitamin D deficiency  - Vit D  25 hydroxy (rtn osteoporosis monitoring)  5. Medication management - CBC with Differential/Platelet - BASIC METABOLIC PANEL WITH GFR - Hepatic function panel - Magnesium   Recommended regular exercise, BP monitoring, weight control, and discussed med and SE's. Recommended labs to assess and monitor clinical status. Further disposition pending results of labs.

## 2014-03-08 ENCOUNTER — Ambulatory Visit (INDEPENDENT_AMBULATORY_CARE_PROVIDER_SITE_OTHER): Payer: Managed Care, Other (non HMO) | Admitting: Internal Medicine

## 2014-03-08 ENCOUNTER — Encounter: Payer: Self-pay | Admitting: Internal Medicine

## 2014-03-08 VITALS — BP 104/66 | HR 68 | Temp 97.7°F | Resp 16 | Ht 70.0 in | Wt 204.8 lb

## 2014-03-08 DIAGNOSIS — E559 Vitamin D deficiency, unspecified: Secondary | ICD-10-CM

## 2014-03-08 DIAGNOSIS — R7303 Prediabetes: Secondary | ICD-10-CM

## 2014-03-08 DIAGNOSIS — R7309 Other abnormal glucose: Secondary | ICD-10-CM

## 2014-03-08 DIAGNOSIS — Z79899 Other long term (current) drug therapy: Secondary | ICD-10-CM

## 2014-03-08 DIAGNOSIS — E785 Hyperlipidemia, unspecified: Secondary | ICD-10-CM

## 2014-03-08 DIAGNOSIS — I10 Essential (primary) hypertension: Secondary | ICD-10-CM

## 2014-03-08 DIAGNOSIS — R5383 Other fatigue: Secondary | ICD-10-CM

## 2014-03-08 LAB — HEPATIC FUNCTION PANEL
ALBUMIN: 4.4 g/dL (ref 3.5–5.2)
ALK PHOS: 55 U/L (ref 39–117)
ALT: 26 U/L (ref 0–53)
AST: 23 U/L (ref 0–37)
BILIRUBIN TOTAL: 0.7 mg/dL (ref 0.2–1.2)
Bilirubin, Direct: 0.2 mg/dL (ref 0.0–0.3)
Indirect Bilirubin: 0.5 mg/dL (ref 0.2–1.2)
Total Protein: 7 g/dL (ref 6.0–8.3)

## 2014-03-08 LAB — CBC WITH DIFFERENTIAL/PLATELET
Basophils Absolute: 0 10*3/uL (ref 0.0–0.1)
Basophils Relative: 1 % (ref 0–1)
EOS ABS: 0.2 10*3/uL (ref 0.0–0.7)
Eosinophils Relative: 4 % (ref 0–5)
HEMATOCRIT: 48.4 % (ref 39.0–52.0)
Hemoglobin: 16.5 g/dL (ref 13.0–17.0)
Lymphocytes Relative: 35 % (ref 12–46)
Lymphs Abs: 1.4 10*3/uL (ref 0.7–4.0)
MCH: 29.5 pg (ref 26.0–34.0)
MCHC: 34.1 g/dL (ref 30.0–36.0)
MCV: 86.4 fL (ref 78.0–100.0)
MONO ABS: 0.4 10*3/uL (ref 0.1–1.0)
MONOS PCT: 10 % (ref 3–12)
MPV: 10.1 fL (ref 8.6–12.4)
Neutro Abs: 2 10*3/uL (ref 1.7–7.7)
Neutrophils Relative %: 50 % (ref 43–77)
Platelets: 216 10*3/uL (ref 150–400)
RBC: 5.6 MIL/uL (ref 4.22–5.81)
RDW: 12.7 % (ref 11.5–15.5)
WBC: 4 10*3/uL (ref 4.0–10.5)

## 2014-03-08 LAB — MAGNESIUM: Magnesium: 1.8 mg/dL (ref 1.5–2.5)

## 2014-03-08 LAB — BASIC METABOLIC PANEL WITH GFR
BUN: 12 mg/dL (ref 6–23)
CO2: 26 meq/L (ref 19–32)
CREATININE: 1.07 mg/dL (ref 0.50–1.35)
Calcium: 9.4 mg/dL (ref 8.4–10.5)
Chloride: 104 mEq/L (ref 96–112)
GFR, Est African American: 85 mL/min
GFR, Est Non African American: 73 mL/min
GLUCOSE: 95 mg/dL (ref 70–99)
Potassium: 3.8 mEq/L (ref 3.5–5.3)
Sodium: 139 mEq/L (ref 135–145)

## 2014-03-08 LAB — TESTOSTERONE: Testosterone: 452 ng/dL (ref 300–890)

## 2014-03-08 LAB — LIPID PANEL
CHOLESTEROL: 122 mg/dL (ref 0–200)
HDL: 46 mg/dL (ref >=40)
LDL Cholesterol: 47 mg/dL (ref 0–99)
TRIGLYCERIDES: 146 mg/dL (ref <150)
Total CHOL/HDL Ratio: 2.7 Ratio
VLDL: 29 mg/dL (ref 0–40)

## 2014-03-08 LAB — TSH: TSH: 1.822 u[IU]/mL (ref 0.350–4.500)

## 2014-03-08 LAB — HEMOGLOBIN A1C
Hgb A1c MFr Bld: 5.4 % (ref <5.7)
Mean Plasma Glucose: 108 mg/dL (ref <117)

## 2014-03-08 MED ORDER — TRAZODONE HCL 150 MG PO TABS: ORAL_TABLET | ORAL | Status: DC

## 2014-03-08 NOTE — Patient Instructions (Signed)

## 2014-03-09 LAB — VITAMIN D 25 HYDROXY (VIT D DEFICIENCY, FRACTURES): Vit D, 25-Hydroxy: 75 ng/mL (ref 30–100)

## 2014-03-09 LAB — INSULIN, FASTING: INSULIN FASTING, SERUM: 11.9 u[IU]/mL (ref 2.0–19.6)

## 2014-03-19 ENCOUNTER — Other Ambulatory Visit: Payer: Self-pay | Admitting: Internal Medicine

## 2014-04-07 ENCOUNTER — Other Ambulatory Visit: Payer: Self-pay | Admitting: Internal Medicine

## 2014-04-07 DIAGNOSIS — G4700 Insomnia, unspecified: Secondary | ICD-10-CM

## 2014-04-07 MED ORDER — CLONAZEPAM 2 MG PO TABS: ORAL_TABLET | ORAL | Status: DC

## 2014-04-12 ENCOUNTER — Other Ambulatory Visit: Payer: Self-pay | Admitting: *Deleted

## 2014-04-12 DIAGNOSIS — G4700 Insomnia, unspecified: Secondary | ICD-10-CM

## 2014-04-12 MED ORDER — CLONAZEPAM 2 MG PO TABS: ORAL_TABLET | ORAL | Status: DC

## 2014-04-17 ENCOUNTER — Other Ambulatory Visit: Payer: Self-pay | Admitting: Physician Assistant

## 2014-05-29 ENCOUNTER — Other Ambulatory Visit: Payer: Self-pay | Admitting: Internal Medicine

## 2014-05-29 DIAGNOSIS — G4700 Insomnia, unspecified: Secondary | ICD-10-CM

## 2014-05-29 MED ORDER — PREGABALIN 300 MG PO CAPS: 300.0000 mg | ORAL_CAPSULE | Freq: Two times a day (BID) | ORAL | Status: DC

## 2014-06-19 ENCOUNTER — Encounter: Payer: Self-pay | Admitting: Physician Assistant

## 2014-06-19 ENCOUNTER — Ambulatory Visit (INDEPENDENT_AMBULATORY_CARE_PROVIDER_SITE_OTHER): Payer: Managed Care, Other (non HMO) | Admitting: Physician Assistant

## 2014-06-19 VITALS — BP 128/80 | HR 70 | Temp 97.8°F | Resp 14 | Wt 208.4 lb

## 2014-06-19 DIAGNOSIS — Z79899 Other long term (current) drug therapy: Secondary | ICD-10-CM

## 2014-06-19 DIAGNOSIS — E291 Testicular hypofunction: Secondary | ICD-10-CM

## 2014-06-19 DIAGNOSIS — I10 Essential (primary) hypertension: Secondary | ICD-10-CM

## 2014-06-19 DIAGNOSIS — R7303 Prediabetes: Secondary | ICD-10-CM

## 2014-06-19 DIAGNOSIS — R7309 Other abnormal glucose: Secondary | ICD-10-CM

## 2014-06-19 DIAGNOSIS — E559 Vitamin D deficiency, unspecified: Secondary | ICD-10-CM

## 2014-06-19 DIAGNOSIS — E785 Hyperlipidemia, unspecified: Secondary | ICD-10-CM

## 2014-06-19 LAB — CBC WITH DIFFERENTIAL/PLATELET
BASOS PCT: 0 % (ref 0–1)
Basophils Absolute: 0 10*3/uL (ref 0.0–0.1)
Eosinophils Absolute: 0.3 10*3/uL (ref 0.0–0.7)
Eosinophils Relative: 5 % (ref 0–5)
HCT: 47.7 % (ref 39.0–52.0)
HEMOGLOBIN: 16.3 g/dL (ref 13.0–17.0)
LYMPHS ABS: 1.5 10*3/uL (ref 0.7–4.0)
Lymphocytes Relative: 29 % (ref 12–46)
MCH: 29.5 pg (ref 26.0–34.0)
MCHC: 34.2 g/dL (ref 30.0–36.0)
MCV: 86.4 fL (ref 78.0–100.0)
MONO ABS: 0.5 10*3/uL (ref 0.1–1.0)
MPV: 10.1 fL (ref 8.6–12.4)
Monocytes Relative: 9 % (ref 3–12)
NEUTROS ABS: 2.9 10*3/uL (ref 1.7–7.7)
Neutrophils Relative %: 57 % (ref 43–77)
Platelets: 188 10*3/uL (ref 150–400)
RBC: 5.52 MIL/uL (ref 4.22–5.81)
RDW: 13.1 % (ref 11.5–15.5)
WBC: 5 10*3/uL (ref 4.0–10.5)

## 2014-06-19 LAB — HEPATIC FUNCTION PANEL
ALK PHOS: 57 U/L (ref 39–117)
ALT: 28 U/L (ref 0–53)
AST: 22 U/L (ref 0–37)
Albumin: 4.2 g/dL (ref 3.5–5.2)
BILIRUBIN DIRECT: 0.2 mg/dL (ref 0.0–0.3)
BILIRUBIN INDIRECT: 0.5 mg/dL (ref 0.2–1.2)
BILIRUBIN TOTAL: 0.7 mg/dL (ref 0.2–1.2)
Total Protein: 6.8 g/dL (ref 6.0–8.3)

## 2014-06-19 LAB — LIPID PANEL
CHOL/HDL RATIO: 3.7 ratio
CHOLESTEROL: 146 mg/dL (ref 0–200)
HDL: 39 mg/dL — ABNORMAL LOW (ref >=40)
LDL Cholesterol: 48 mg/dL (ref 0–99)
Triglycerides: 294 mg/dL — ABNORMAL HIGH (ref <150)
VLDL: 59 mg/dL — ABNORMAL HIGH (ref 0–40)

## 2014-06-19 LAB — BASIC METABOLIC PANEL WITH GFR
BUN: 17 mg/dL (ref 6–23)
CO2: 25 mEq/L (ref 19–32)
CREATININE: 1.07 mg/dL (ref 0.50–1.35)
Calcium: 9.2 mg/dL (ref 8.4–10.5)
Chloride: 107 mEq/L (ref 96–112)
GFR, Est African American: 84 mL/min
GFR, Est Non African American: 73 mL/min
Glucose, Bld: 100 mg/dL — ABNORMAL HIGH (ref 70–99)
Potassium: 3.8 mEq/L (ref 3.5–5.3)
Sodium: 142 mEq/L (ref 135–145)

## 2014-06-19 LAB — MAGNESIUM: MAGNESIUM: 1.9 mg/dL (ref 1.5–2.5)

## 2014-06-19 NOTE — Progress Notes (Signed)
Assessment and Plan:  1. Hypertension -Continue medication, monitor blood pressure at home. Continue DASH diet.  Reminder to go to the ER if any CP, SOB, nausea, dizziness, severe HA, changes vision/speech, left arm numbness and tingling and jaw pain.  2. Cholesterol -Continue diet and exercise. Check cholesterol.   3. Prediabetes  -Continue diet and exercise. Check A1C  4. Vitamin D Def - check level and continue medications.   5. ASHD Control blood pressure, cholesterol, glucose, increase exercise.   Continue diet and meds as discussed. Further disposition pending results of labs. Over 30 minutes of exam, counseling, chart review, and critical decision making was performed  HPI 64 y.o. male  presents for 3 month follow up on hypertension, cholesterol, prediabetes, and vitamin D deficiency.   His blood pressure has been controlled at home, today their BP is BP: 128/80 mmHg  He does workout. He denies chest pain, shortness of breath, dizziness. Has history of ASHD, on plavix, BP and cholesterol at goal.   He is on cholesterol medication, on crestor and zetia and denies myalgias. His cholesterol is at goal, less than 70. The cholesterol last visit was:   Lab Results  Component Value Date   CHOL 122 03/08/2014   HDL 46 03/08/2014   LDLCALC 47 03/08/2014   TRIG 146 03/08/2014   CHOLHDL 2.7 03/08/2014   Last A1C in the office was:  Lab Results  Component Value Date   HGBA1C 5.4 03/08/2014   Patient is on Vitamin D supplement.   Lab Results  Component Value Date   VD25OH 22 03/08/2014     He has insomnia, is on 1 klonopin 2 mg and lyrica 300mg  at night which has helped with his sleep.  He has a history of testosterone deficiency and is on testosterone replacement. He states that the testosterone helps with his energy, libido, muscle mass. Lab Results  Component Value Date   TESTOSTERONE 452 03/08/2014    Current Medications:  Current Outpatient Prescriptions on File  Prior to Visit  Medication Sig Dispense Refill  . BYSTOLIC 10 MG tablet take 1 tablet by mouth once daily 30 tablet 99  . clonazePAM (KLONOPIN) 2 MG tablet Take 1/2 to 1 tablet at bedtime if needed for sleep (Use maximum 5 x/week) 30 tablet 2  . clonazePAM (KLONOPIN) 2 MG tablet Take 1/2 to 1 tablet at bedtime if needed for sleep (Use maximum 5 x/week) 30 tablet 2  . clopidogrel (PLAVIX) 75 MG tablet take 1 tablet by mouth once daily 30 tablet 99  . CRESTOR 10 MG tablet take 1 tablet by mouth once daily 30 tablet 6  . HYDROcodone-acetaminophen (NORCO) 5-325 MG per tablet Take 1/2 to 1 pill q 3-4 hours prn cough 50 tablet 0  . LISINOPRIL PO Take 10 mg by mouth daily.     . pregabalin (LYRICA) 300 MG capsule Take 1 capsule (300 mg total) by mouth 2 (two) times daily. 60 capsule 5  . traZODone (DESYREL) 150 MG tablet Take 1/2 to 1 tablet 1 hour before sleep 90 tablet 1  . Vitamin D, Ergocalciferol, (DRISDOL) 50000 UNITS CAPS capsule take 1 capsule by mouth once daily 30 capsule 99  . ZETIA 10 MG tablet take 1 tablet by mouth once daily 30 tablet 0   No current facility-administered medications on file prior to visit.   Medical History:  Past Medical History  Diagnosis Date  . Hyperlipidemia   . Hypertension   . Hernia   . Diverticulosis   .  Vitamin D deficiency   . GERD (gastroesophageal reflux disease)   . CAD (coronary artery disease), native coronary artery     Cath 04/19/2001  normal Left main, moderate proximal disease, 80% stenosis distal LAD, 99% stenosis proximal OM 1, 60% stenosis mid RCA, 70% stenosis ostial mid PDA  PCI with bare metal stent of OM1 same date 3.5 x 18 mm Multilink    . History of PSVT     Ablation done in 2004 at [facility]   . Symptomatic PVCs    Allergies:  Allergies  Allergen Reactions  . Amoxicillin Hives and Swelling  . Lunesta [Eszopiclone]     Restless legs  . Penicillins Swelling     Review of Systems:  Review of Systems  Constitutional:  Negative.   HENT: Negative.   Eyes: Negative.   Respiratory: Negative.  Negative for shortness of breath.   Cardiovascular: Negative.  Negative for chest pain.  Gastrointestinal: Negative.   Genitourinary: Negative.   Musculoskeletal: Negative.   Skin: Negative.   Neurological: Negative.   Endo/Heme/Allergies: Negative.   Psychiatric/Behavioral: Negative.     Family history- Review and unchanged Social history- Review and unchanged Physical Exam: BP 128/80 mmHg  Pulse 70  Temp(Src) 97.8 F (36.6 C) (Temporal)  Resp 14  Wt 208 lb 6.4 oz (94.53 kg) Wt Readings from Last 3 Encounters:  06/19/14 208 lb 6.4 oz (94.53 kg)  03/08/14 204 lb 12.8 oz (92.897 kg)  10/13/13 206 lb (93.441 kg)   General Appearance: Well nourished, in no apparent distress. Eyes: PERRLA, EOMs, conjunctiva no swelling or erythema Sinuses: No Frontal/maxillary tenderness ENT/Mouth: Ext aud canals clear, TMs without erythema, bulging. No erythema, swelling, or exudate on post pharynx.  Tonsils not swollen or erythematous. Hearing normal.  Neck: Supple, thyroid normal.  Respiratory: Respiratory effort normal, BS equal bilaterally without rales, rhonchi, wheezing or stridor.  Cardio: RRR with no MRGs. Brisk peripheral pulses without edema.  Abdomen: Soft, + BS,  Non tender, no guarding, rebound, hernias, masses. Lymphatics: Non tender without lymphadenopathy.  Musculoskeletal: Full ROM, 5/5 strength, Normal gait Skin: Warm, dry without rashes, lesions, ecchymosis.  Neuro: Cranial nerves intact. Normal muscle tone, no cerebellar symptoms. Psych: Awake and oriented X 3, normal affect, Insight and Judgment appropriate.    Vicie Mutters, PA-C 9:01 AM [facility]

## 2014-06-19 NOTE — Patient Instructions (Signed)
9 Ways to Naturally Increase Testosterone Levels  1.   Lose Weight If you're overweight, shedding the excess pounds may increase your testosterone levels, according to research presented at the Endocrine Society's 2012 meeting. Overweight men are more likely to have low testosterone levels to begin with, so this is an important trick to increase your body's testosterone production when you need it most.  2.   High-Intensity Exercise like Peak Fitness  Short intense exercise has a proven positive effect on increasing testosterone levels and preventing its decline. That's unlike aerobics or prolonged moderate exercise, which have shown to have negative or no effect on testosterone levels. Having a whey protein meal after exercise can further enhance the satiety/testosterone-boosting impact (hunger hormones cause the opposite effect on your testosterone and libido). Here's a summary of what a typical high-intensity Peak Fitness routine might look like: " Warm up for three minutes  " Exercise as hard and fast as you can for 30 seconds. You should feel like you couldn't possibly go on another few seconds  " Recover at a slow to moderate pace for 90 seconds  " Repeat the high intensity exercise and recovery 7 more times .  3.   Consume Plenty of Zinc The mineral zinc is important for testosterone production, and supplementing your diet for as little as six weeks has been shown to cause a marked improvement in testosterone among men with low levels.1 Likewise, research has shown that restricting dietary sources of zinc leads to a significant decrease in testosterone, while zinc supplementation increases it2 -- and even protects men from exercised-induced reductions in testosterone levels.3 It's estimated that up to 45 percent of adults over the age of 60 may have lower than recommended zinc intakes; even when dietary supplements were added in, an estimated 20-25 percent of older adults still had inadequate  zinc intakes, according to a National Health and Nutrition Examination Survey.4 Your diet is the best source of zinc; along with protein-rich foods like meats and fish, other good dietary sources of zinc include raw milk, raw cheese, beans, and yogurt or kefir made from raw milk. It can be difficult to obtain enough dietary zinc if you're a vegetarian, and also for meat-eaters as well, largely because of conventional farming methods that rely heavily on chemical fertilizers and pesticides. These chemicals deplete the soil of nutrients ... nutrients like zinc that must be absorbed by plants in order to be passed on to you. In many cases, you may further deplete the nutrients in your food by the way you prepare it. For most food, cooking it will drastically reduce its levels of nutrients like zinc . particularly over-cooking, which many people do. If you decide to use a zinc supplement, stick to a dosage of less than 40 mg a day, as this is the recommended adult upper limit. Taking too much zinc can interfere with your body's ability to absorb other minerals, especially copper, and may cause nausea as a side effect.  4.   Strength Training In addition to Peak Fitness, strength training is also known to boost testosterone levels, provided you are doing so intensely enough. When strength training to boost testosterone, you'll want to increase the weight and lower your number of reps, and then focus on exercises that work a large number of muscles, such as dead lifts or squats.  You can "turbo-charge" your weight training by going slower. By slowing down your movement, you're actually turning it into a high-intensity exercise. Super Slow   movement allows your muscle, at the microscopic level, to access the maximum number of cross-bridges between the protein filaments that produce movement in the muscle.   5.   Optimize Your Vitamin D Levels Vitamin D, a steroid hormone, is essential for the healthy development  of the nucleus of the sperm cell, and helps maintain semen quality and sperm count. Vitamin D also increases levels of testosterone, which may boost libido. In one study, overweight men who were given vitamin D supplements had a significant increase in testosterone levels after one year.5   6.   Reduce Stress When you're under a lot of stress, your body releases high levels of the stress hormone cortisol. This hormone actually blocks the effects of testosterone,6 presumably because, from a biological standpoint, testosterone-associated behaviors (mating, competing, aggression) may have lowered your chances of survival in an emergency (hence, the "fight or flight" response is dominant, courtesy of cortisol).  7.   Limit or Eliminate Sugar from Your Diet Testosterone levels decrease after you eat sugar, which is likely because the sugar leads to a high insulin level, another factor leading to low testosterone.7 Based on USDA estimates, the average American consumes 12 teaspoons of sugar a day, which equates to about TWO TONS of sugar during a lifetime.  8.   Eat Healthy Fats By healthy, this means not only mon- and polyunsaturated fats, like that found in avocadoes and nuts, but also saturated, as these are essential for building testosterone. Research shows that a diet with less than 40 percent of energy as fat (and that mainly from animal sources, i.e. saturated) lead to a decrease in testosterone levels.8 My personal diet is about 60-70 percent healthy fat, and other experts agree that the ideal diet includes somewhere between 50-70 percent fat.  It's important to understand that your body requires saturated fats from animal and vegetable sources (such as meat, dairy, certain oils, and tropical plants like coconut) for optimal functioning, and if you neglect this important food group in favor of sugar, grains and other starchy carbs, your health and weight are almost guaranteed to suffer. Examples of  healthy fats you can eat more of to give your testosterone levels a boost include: Olives and Olive oil  Coconuts and coconut oil Butter made from raw grass-fed organic milk Raw nuts, such as, almonds or pecans Organic pastured egg yolks Avocados Grass-fed meats Palm oil Unheated organic nut oils   9.   Boost Your Intake of Branch Chain Amino Acids (BCAA) from Foods Like [place] suggests that BCAAs result in higher testosterone levels, particularly when taken along with resistance training.9 While BCAAs are available in supplement form, you'll find the highest concentrations of BCAAs like leucine in dairy products - especially quality cheeses and whey protein. Even when getting leucine from your natural food supply, it's often wasted or used as a building block instead of an anabolic agent. So to create the correct anabolic environment, you need to boost leucine consumption way beyond mere maintenance levels. That said, keep in mind that using leucine as a free form amino acid can be highly counterproductive as when free form amino acids are artificially administrated, they rapidly enter your circulation while disrupting insulin function, and impairing your body's glycemic control. Food-based leucine is really the ideal form that can benefit your muscles without side effects.   Here is some information to help you keep your heart healthy: Move it! - Aim for 30 mins of activity every day. Take it  slowly at first. Talk to [place] before starting any new exercise program.   Lose it.  -Body Mass Index (BMI) can indicate if you need to lose weight. A healthy range is 18.5-24.9. For a BMI calculator, go to [employer]  Waist Management -Excess abdominal fat is a risk factor for heart disease, diabetes, asthma, stroke and more. Ideal waist circumference is less than 35" for women and less than 40" for men.   Eat Right -focus on fruits, vegetables, whole grains, and meals you make yourself.  Avoid foods with trans fat and high sugar/sodium content.   Snooze or Snore? - Loud snoring can be a sign of sleep apnea, a significant risk factor for high blood pressure, heart attach, stroke, and heart arrhythmias.  Kick the habit -Quit Smoking! Avoid second hand smoke. A single cigarette raises your blood pressure for 20 mins and increases the risk of heart attack and stroke for the next 24 hours.   Are Aspirin and Supplements right for you? -Add ENTERIC COATED low dose 81 mg Aspirin daily OR can do every other day if you have easy bruising to protect your heart and head. As well as to reduce risk of Colon Cancer by 20 %, Skin Cancer by 26 % , Melanoma by 46% and Pancreatic cancer by 60%  Say "No to Stress -There may be little you can do about problems that cause stress. However, techniques such as long walks, meditation, and exercise can help you manage it.   Start Now! - Make changes one at a time and set reasonable goals to increase your likelihood of success.

## 2014-06-20 ENCOUNTER — Encounter: Payer: Self-pay | Admitting: Physician Assistant

## 2014-06-20 DIAGNOSIS — N182 Chronic kidney disease, stage 2 (mild): Secondary | ICD-10-CM | POA: Insufficient documentation

## 2014-06-20 LAB — VITAMIN D 25 HYDROXY (VIT D DEFICIENCY, FRACTURES): VIT D 25 HYDROXY: 63 ng/mL (ref 30–100)

## 2014-06-20 LAB — TESTOSTERONE: Testosterone: 532 ng/dL (ref 300–890)

## 2014-06-20 LAB — TSH: TSH: 2.549 u[IU]/mL (ref 0.350–4.500)

## 2014-06-22 ENCOUNTER — Encounter: Payer: Self-pay | Admitting: Physician Assistant

## 2014-07-11 ENCOUNTER — Other Ambulatory Visit: Payer: Self-pay | Admitting: Physician Assistant

## 2014-08-05 ENCOUNTER — Other Ambulatory Visit: Payer: Self-pay | Admitting: Internal Medicine

## 2014-08-07 NOTE — Telephone Encounter (Signed)
Called Rx into [facility]

## 2014-08-29 ENCOUNTER — Ambulatory Visit (INDEPENDENT_AMBULATORY_CARE_PROVIDER_SITE_OTHER): Payer: Managed Care, Other (non HMO) | Admitting: Internal Medicine

## 2014-08-29 VITALS — BP 108/60 | HR 70 | Temp 98.2°F | Resp 18 | Ht 70.0 in | Wt 215.0 lb

## 2014-08-29 DIAGNOSIS — M25562 Pain in left knee: Secondary | ICD-10-CM

## 2014-08-29 MED ORDER — MELOXICAM 15 MG PO TABS: 15.0000 mg | ORAL_TABLET | Freq: Every day | ORAL | Status: DC

## 2014-08-29 NOTE — Progress Notes (Signed)
   Subjective:    Patient ID: Bryan [name], male    DOB: [birth year], 64 y.o.   MRN: [id number]  Knee Pain   Patient presents to the office for evaluation of left knee pain x 1 week.  He reports that he was running and fell with the knee tucked back behind him.  He reports that the knee gradually swelled over the week and has continuously been aching and painful.  He notes some popping and clicking going up and down steps.  He reports that he has been icing and also elevating the knee.  He has not been taking antiinflammatory medications to help.  He does feel that the knee is getting slightly better. No prior injuries.    Review of Systems  Constitutional: Negative for fever, chills and fatigue.  Musculoskeletal: Positive for joint swelling and arthralgias.  Skin: Negative for color change and wound.       Objective:   Physical Exam  Constitutional: He is oriented to person, place, and time. He appears well-developed and well-nourished. No distress.  HENT:  Head: Normocephalic.  Mouth/Throat: Oropharynx is clear and moist. No oropharyngeal exudate.  Eyes: Conjunctivae are normal. No scleral icterus.  Neck: Normal range of motion. Neck supple. No JVD present. No thyromegaly present.  Cardiovascular: Normal rate, regular rhythm, normal heart sounds and intact distal pulses.  Exam reveals no gallop and no friction rub.   No murmur heard. Pulmonary/Chest: Effort normal and breath sounds normal. No respiratory distress. He has no wheezes. He has no rales. He exhibits no tenderness.  Musculoskeletal:       Left knee: He exhibits swelling and bony tenderness. He exhibits normal range of motion, no effusion, no ecchymosis, no deformity, no laceration, no erythema, normal alignment, no LCL laxity, normal patellar mobility, normal meniscus and no MCL laxity. Tenderness found. Medial joint line tenderness noted. No MCL, no LCL and no patellar tendon tenderness noted.  Negative patella grind test,  no pain with axial loading.  Full active and passive ROM, no crepitus, negative McMurray, negative anterior and posterior drawer.  Lymphadenopathy:    He has no cervical adenopathy.  Neurological: He is alert and oriented to person, place, and time.  Skin: Skin is warm and dry. He is not diaphoretic.  Psychiatric: He has a normal mood and affect. His behavior is normal. Judgment and thought content normal.  Nursing note and vitals reviewed.         Assessment & Plan:    1. Left knee pain -exam most consistent with Vastus lateralis strain.  Full strength.  Given Mechanism of fall consider possible meniscal tear -elevate -heat -gentle stretching - meloxicam (MOBIC) 15 MG tablet; Take 1 tablet (15 mg total) by mouth daily.  Dispense: 30 tablet; Refill: 2 -if no improvement 2 weeks send to ortho

## 2014-08-29 NOTE — Patient Instructions (Signed)
Muscle Strain A muscle strain is an injury that occurs when a muscle is stretched beyond its normal length. Usually a small number of muscle fibers are torn when this happens. Muscle strain is rated in degrees. First-degree strains have the least amount of muscle fiber tearing and pain. Second-degree and third-degree strains have increasingly more tearing and pain.  Usually, recovery from muscle strain takes 1-2 weeks. Complete healing takes 5-6 weeks.  CAUSES  Muscle strain happens when a sudden, violent force placed on a muscle stretches it too far. This may occur with lifting, sports, or a fall.  RISK FACTORS Muscle strain is especially common in athletes.  SIGNS AND SYMPTOMS At the site of the muscle strain, there may be:  Pain.  Bruising.  Swelling.  Difficulty using the muscle due to pain or lack of normal function. DIAGNOSIS  Your health care provider will perform a physical exam and ask about your medical history. TREATMENT  Often, the best treatment for a muscle strain is resting, icing, and applying cold compresses to the injured area.  HOME CARE INSTRUCTIONS   Use the PRICE method of treatment to promote muscle healing during the first 2-3 days after your injury. The PRICE method involves:  Protecting the muscle from being injured again.  Restricting your activity and resting the injured body part.  Icing your injury. To do this, put ice in a plastic bag. Place a towel between your skin and the bag. Then, apply the ice and leave it on from 15-20 minutes each hour. After the third day, switch to moist heat packs.  Apply compression to the injured area with a splint or elastic bandage. Be careful not to wrap it too tightly. This may interfere with blood circulation or increase swelling.  Elevate the injured body part above the level of your heart as often as you can.  Only take over-the-counter or prescription medicines for pain, discomfort, or fever as directed by your  health care provider.  Warming up prior to exercise helps to prevent future muscle strains. SEEK MEDICAL CARE IF:   You have increasing pain or swelling in the injured area.  You have numbness, tingling, or a significant loss of strength in the injured area. MAKE SURE YOU:   Understand these instructions.  Will watch your condition.  Will get help right away if you are not doing well or get worse. Document Released: 12/23/2004 Document Revised: 10/13/2012 Document Reviewed: 07/22/2012 [facility] Patient Information 2015 [place], [place]. This information is not intended to replace advice given to you by your health care provider. Make sure you discuss any questions you have with your health care provider.  Knee, Cartilage (Meniscus) Injury It is suspected that you have a torn cartilage (meniscus) in your knee. The menisci are made of tough cartilage and fit between the surfaces of the thigh and leg bones. The menisci are C-shaped and have a wedged profile. The wedged profile helps the stability of the joint by keeping the rounded femur surface from sliding off the flat tibial surface. The menisci are fed (nourished) by small blood vessels, but there is also a large area at the inner edge of the meniscus that does not have a good blood supply (avascular). This presents a problem when there is an injury to the meniscus because areas without good blood supply heal poorly. As a result when there is a torn cartilage in the knee, surgery is often required to fix it. This is usually done with a surgical procedure  less invasive than open surgery (arthroscopy). Some times open surgery of the knee is required if there is other damage. PURPOSE OF THE MENISCUS The medial meniscus rests on the medial tibial plateau. The tibia is the large bone in your lower leg (the shin bone). The medial tibial plateau is the upper end of the bone making up the inner part of your knee. The lateral meniscus serves the same  purpose and is located on the outside of the knee. The menisci help to distribute your body weight across the knee joint; they act as shock absorbers. Without the meniscus present, the weight of your body would be unevenly applied to the bones in your legs (the femur and tibia). The femur is the large bone in your thigh. This uneven weight distribution would cause increased wear and tear on the cartilage lining the joint surfaces, leading to early damage (arthritis) of these areas. The presence of the menisci cartilage is necessary for a healthy knee. PURPOSE OF THE KNEE CARTILAGE The knee joint is made up of three bones: the thigh bone (femur), the shin bone (tibia), and the knee cap (patella). The surfaces of these bones at the knee joint are covered with cartilage called articular cartilage. This smooth, slippery surface allows the bones to slide against each other without causing bone damage. The meniscus sits between these cartilaginous surfaces of the bones. It distributes the weight evenly in the joints and helps with the stability of the joint (keeps the joint steady). HOME CARE INSTRUCTIONS  Use crutches and external braces as instructed.  Once home, an ice pack applied to your injured knee may help with discomfort and keep the swelling down. An ice pack can be used for the first couple of days or as instructed.  Only take over-the-counter or prescription medicines for pain, discomfort, or fever as directed by your caregiver.  Call if you do not have relief of pain with medications or if there is increasing in pain.  Call if your foot becomes cold or blue.  You may resume normal diet and activities as directed.  Make sure to keep your appointments with your follow-up caregiver. This injury may require further evaluation and treatment beyond the temporary treatment given today. Document Released: 03/15/2002 Document Revised: 05/09/2013 Document Reviewed: 07/07/2008 [facility] Patient  Information 2015 [place], [place]. This information is not intended to replace advice given to you by your health care provider. Make sure you discuss any questions you have with your health care provider.

## 2014-09-12 ENCOUNTER — Other Ambulatory Visit: Payer: Self-pay | Admitting: Physician Assistant

## 2014-10-02 ENCOUNTER — Other Ambulatory Visit: Payer: Self-pay | Admitting: Internal Medicine

## 2014-10-02 MED ORDER — ROSUVASTATIN CALCIUM 10 MG PO TABS: 10.0000 mg | ORAL_TABLET | Freq: Every day | ORAL | Status: DC

## 2014-10-02 MED ORDER — EZETIMIBE 10 MG PO TABS: 10.0000 mg | ORAL_TABLET | Freq: Every day | ORAL | Status: DC

## 2014-10-02 MED ORDER — VITAMIN D (ERGOCALCIFEROL) 1.25 MG (50000 UNIT) PO CAPS: 50000.0000 [IU] | ORAL_CAPSULE | Freq: Every day | ORAL | Status: DC

## 2014-10-02 MED ORDER — LISINOPRIL 20 MG PO TABS: 20.0000 mg | ORAL_TABLET | Freq: Every day | ORAL | Status: DC

## 2014-10-02 MED ORDER — NEBIVOLOL HCL 10 MG PO TABS: 10.0000 mg | ORAL_TABLET | Freq: Every day | ORAL | Status: DC

## 2014-10-02 MED ORDER — CLOPIDOGREL BISULFATE 75 MG PO TABS: 75.0000 mg | ORAL_TABLET | Freq: Every day | ORAL | Status: DC

## 2014-10-06 ENCOUNTER — Other Ambulatory Visit: Payer: Self-pay | Admitting: Physician Assistant

## 2014-10-06 MED ORDER — AZITHROMYCIN 250 MG PO TABS: ORAL_TABLET | ORAL | Status: AC

## 2014-10-23 ENCOUNTER — Ambulatory Visit (INDEPENDENT_AMBULATORY_CARE_PROVIDER_SITE_OTHER): Payer: Managed Care, Other (non HMO) | Admitting: Internal Medicine

## 2014-10-23 ENCOUNTER — Encounter: Payer: Self-pay | Admitting: Internal Medicine

## 2014-10-23 VITALS — BP 124/78 | HR 80 | Temp 97.7°F | Resp 16 | Ht 70.0 in | Wt 211.2 lb

## 2014-10-23 DIAGNOSIS — Z1212 Encounter for screening for malignant neoplasm of rectum: Secondary | ICD-10-CM

## 2014-10-23 DIAGNOSIS — F329 Major depressive disorder, single episode, unspecified: Secondary | ICD-10-CM | POA: Diagnosis not present

## 2014-10-23 DIAGNOSIS — Z79899 Other long term (current) drug therapy: Secondary | ICD-10-CM | POA: Diagnosis not present

## 2014-10-23 DIAGNOSIS — E559 Vitamin D deficiency, unspecified: Secondary | ICD-10-CM | POA: Diagnosis not present

## 2014-10-23 DIAGNOSIS — I2510 Atherosclerotic heart disease of native coronary artery without angina pectoris: Secondary | ICD-10-CM | POA: Diagnosis not present

## 2014-10-23 DIAGNOSIS — F32A Depression, unspecified: Secondary | ICD-10-CM

## 2014-10-23 DIAGNOSIS — N182 Chronic kidney disease, stage 2 (mild): Secondary | ICD-10-CM | POA: Diagnosis not present

## 2014-10-23 DIAGNOSIS — I10 Essential (primary) hypertension: Secondary | ICD-10-CM

## 2014-10-23 DIAGNOSIS — Z6830 Body mass index (BMI) 30.0-30.9, adult: Secondary | ICD-10-CM

## 2014-10-23 DIAGNOSIS — R5383 Other fatigue: Secondary | ICD-10-CM

## 2014-10-23 DIAGNOSIS — Z111 Encounter for screening for respiratory tuberculosis: Secondary | ICD-10-CM

## 2014-10-23 DIAGNOSIS — Z23 Encounter for immunization: Secondary | ICD-10-CM | POA: Diagnosis not present

## 2014-10-23 DIAGNOSIS — Z0001 Encounter for general adult medical examination with abnormal findings: Secondary | ICD-10-CM

## 2014-10-23 DIAGNOSIS — R7303 Prediabetes: Secondary | ICD-10-CM | POA: Diagnosis not present

## 2014-10-23 DIAGNOSIS — E785 Hyperlipidemia, unspecified: Secondary | ICD-10-CM

## 2014-10-23 DIAGNOSIS — R6889 Other general symptoms and signs: Secondary | ICD-10-CM

## 2014-10-23 DIAGNOSIS — Z125 Encounter for screening for malignant neoplasm of prostate: Secondary | ICD-10-CM | POA: Diagnosis not present

## 2014-10-23 DIAGNOSIS — E669 Obesity, unspecified: Secondary | ICD-10-CM | POA: Insufficient documentation

## 2014-10-23 LAB — BASIC METABOLIC PANEL WITH GFR
BUN: 11 mg/dL (ref 7–25)
CALCIUM: 9.2 mg/dL (ref 8.6–10.3)
CO2: 25 mmol/L (ref 20–31)
Chloride: 102 mmol/L (ref 98–110)
Creat: 1 mg/dL (ref 0.70–1.25)
GFR, EST NON AFRICAN AMERICAN: 79 mL/min (ref >=60)
Glucose, Bld: 75 mg/dL (ref 65–99)
Potassium: 4.1 mmol/L (ref 3.5–5.3)
SODIUM: 138 mmol/L (ref 135–146)

## 2014-10-23 LAB — CBC WITH DIFFERENTIAL/PLATELET
BASOS PCT: 1 % (ref 0–1)
Basophils Absolute: 0.1 10*3/uL (ref 0.0–0.1)
EOS ABS: 0.2 10*3/uL (ref 0.0–0.7)
Eosinophils Relative: 4 % (ref 0–5)
HCT: 48.2 % (ref 39.0–52.0)
HEMOGLOBIN: 16.8 g/dL (ref 13.0–17.0)
Lymphocytes Relative: 28 % (ref 12–46)
Lymphs Abs: 1.6 10*3/uL (ref 0.7–4.0)
MCH: 29.6 pg (ref 26.0–34.0)
MCHC: 34.9 g/dL (ref 30.0–36.0)
MCV: 84.9 fL (ref 78.0–100.0)
MONO ABS: 0.6 10*3/uL (ref 0.1–1.0)
MPV: 10.3 fL (ref 8.6–12.4)
Monocytes Relative: 10 % (ref 3–12)
NEUTROS ABS: 3.3 10*3/uL (ref 1.7–7.7)
Neutrophils Relative %: 57 % (ref 43–77)
PLATELETS: 185 10*3/uL (ref 150–400)
RBC: 5.68 MIL/uL (ref 4.22–5.81)
RDW: 13.1 % (ref 11.5–15.5)
WBC: 5.8 10*3/uL (ref 4.0–10.5)

## 2014-10-23 LAB — HEPATIC FUNCTION PANEL
ALT: 33 U/L (ref 9–46)
AST: 31 U/L (ref 10–35)
Albumin: 4.4 g/dL (ref 3.6–5.1)
Alkaline Phosphatase: 53 U/L (ref 40–115)
BILIRUBIN DIRECT: 0.3 mg/dL — AB (ref <=0.2)
BILIRUBIN INDIRECT: 1.1 mg/dL (ref 0.2–1.2)
BILIRUBIN TOTAL: 1.4 mg/dL — AB (ref 0.2–1.2)
Total Protein: 7.1 g/dL (ref 6.1–8.1)

## 2014-10-23 LAB — MAGNESIUM: MAGNESIUM: 1.9 mg/dL (ref 1.5–2.5)

## 2014-10-23 LAB — LIPID PANEL
CHOL/HDL RATIO: 2.6 ratio (ref <=5.0)
CHOLESTEROL: 135 mg/dL (ref 125–200)
HDL: 52 mg/dL (ref >=40)
LDL Cholesterol: 57 mg/dL (ref <130)
TRIGLYCERIDES: 131 mg/dL (ref <150)
VLDL: 26 mg/dL (ref <30)

## 2014-10-23 LAB — HEMOGLOBIN A1C
Hgb A1c MFr Bld: 5.6 % (ref <5.7)
Mean Plasma Glucose: 114 mg/dL (ref <117)

## 2014-10-23 LAB — IRON AND TIBC
%SAT: 51 % (ref 15–60)
IRON: 162 ug/dL (ref 50–180)
TIBC: 315 ug/dL (ref 250–425)
UIBC: 153 ug/dL (ref 125–400)

## 2014-10-23 MED ORDER — SERTRALINE HCL 50 MG PO TABS: 50.0000 mg | ORAL_TABLET | Freq: Every day | ORAL | Status: DC

## 2014-10-23 NOTE — Progress Notes (Signed)
Patient ID: Bryan [name], male   DOB: Nov 15, 1950, 64 y.o.   MRN: [id number]  Annual Preventative Visit and Comprehensive Evaluation, Examination & Managrement  This very nice 64 y.o. [name] presents for  presents for a Preventative Visit & comprehensive evaluation and management of multiple medical co-morbidities.  Patient has been followed for HTN, ASCAD, Prediabetes, Hyperlipidemia, and Vitamin D Deficiency.   HTN predates since 18 when he presented at the premature age of 64 yo with an ACS undergoing PTCA & Stenting. In 2003 he had a SEMI.  In 2004, he underwent an ablation for recurrent  PSVT. Last Cardiolite in 2010 was negative.  Patient's BP has been controlled at home.Today's BP: 124/78 mmHg. Patient denies any cardiac symptoms as chest pain, palpitations, shortness of breath, dizziness or ankle swelling. Patient does exercise regularly & vigorously.    Patient's hyperlipidemia is controlled with diet and medications. Patient denies myalgias or other medication SE's. Last lipids were at goal with Cholesterol 146; HDL 39*; LDL 48; but with elevated Triglycerides 294 on 06/19/2014.    Patient has Morbid obesity and is proactively screened expectantly for prediabetes since and patient denies reactive hypoglycemic symptoms, visual blurring, diabetic polys or paresthesias. Last A1c was 5.4% on 03/08/2014.   Finally, patient has history of Vitamin D Deficiency of 26 in 2008 and last vitamin D was 63 on 06/19/2014.      Medication Sig  . clonazePAM (KLONOPIN) 2 MG tablet take 1/2 to 1 tablet by mouth at bedtime if needed for sleep MAXIUMUM OF 5 TIMES A WEEK  . clopidogrel (PLAVIX) 75 MG tablet Take 1 tablet (75 mg total) by mouth daily.  Marland Kitchen ezetimibe (ZETIA) 10 MG tablet Take 1 tablet (10 mg total) by mouth daily.  Marland Kitchen lisinopril (PRINIVIL,ZESTRIL) 20 MG tablet Take 1 tablet (20 mg total) by mouth daily.  . nebivolol (BYSTOLIC) 10 MG tablet Take 1 tablet (10 mg total) by mouth daily.  . rosuvastatin  (CRESTOR) 10 MG tablet Take 1 tablet (10 mg total) by mouth daily.  . Vitamin D 50, 000 UNITS CAPS  Take 1 capsule (50,000 Units total) by mouth daily.   Allergies  Allergen Reactions  . Amoxicillin Hives and Swelling  . Lunesta [Eszopiclone]     Restless legs  . Penicillins Swelling   Past Medical History  Diagnosis Date  . Hyperlipidemia   . Hypertension   . Hernia   . Diverticulosis   . Vitamin D deficiency   . GERD (gastroesophageal reflux disease)   . CAD (coronary artery disease), native coronary artery     Cath 04/19/2001  normal Left main, moderate proximal disease, 80% stenosis distal LAD, 99% stenosis proximal OM 1, 60% stenosis mid RCA, 70% stenosis ostial mid PDA  PCI with bare metal stent of OM1 same date 3.5 x 18 mm Multilink    . History of PSVT     Ablation done in 2004 at [facility]   . Symptomatic PVCs    Health Maintenance  Topic Date Due  . Hepatitis C Screening  1950/09/16  . HIV Screening  05/30/1965  . INFLUENZA VACCINE  08/07/2014  . TETANUS/TDAP  01/07/2016  . COLONOSCOPY  12/24/2021  . ZOSTAVAX  Completed   Immunization History  Administered Date(s) Administered  . Influenza Split 10/13/2013, 10/23/2014  . Influenza,inj,quad, With Preservative 01/13/2013  . Influenza-Unspecified 10/07/2011  . PPD Test 10/13/2013, 10/23/2014  . Pneumococcal Conjugate-13 10/13/2013  . Pneumococcal Polysaccharide-23 10/23/2014  . Td 01/06/2006  . Zoster 09/22/2011  Past Surgical History  Procedure Laterality Date  . Appendectomy    . Achilles tendon repair      left  . Cardiac electrophysiology mapping and ablation     Family History  Problem Relation Age of Onset  . Colon cancer Mother     malignant polyp of rectosigmoid colon  . Hypothyroidism Mother   . Irritable bowel syndrome Mother   . Cancer Father     brain   Social History   Social History  . Marital Status: Married    Spouse Name: N/A  . Number of Children: N/A  . Years of  Education: N/A   Occupational History  . Sales   Social History Main Topics  . Smoking status: Never Smoker   . Smokeless tobacco: Never Used  . Alcohol Use: No     Comment: rarely  . Drug Use: No  . Sexual Activity: Active    ROS Constitutional: Denies fever, chills, weight loss/gain, headaches, insomnia,  night sweats or change in appetite. Does c/o fatigue. Eyes: Denies redness, blurred vision, diplopia, discharge, itchy or watery eyes.  ENT: Denies discharge, congestion, post nasal drip, epistaxis, sore throat, earache, hearing loss, dental pain, Tinnitus, Vertigo, Sinus pain or snoring.  Cardio: Denies chest pain, palpitations, irregular heartbeat, syncope, dyspnea, diaphoresis, orthopnea, PND, claudication or edema Respiratory: denies cough, dyspnea, DOE, pleurisy, hoarseness, laryngitis or wheezing.  Gastrointestinal: Denies dysphagia, heartburn, reflux, water brash, pain, cramps, nausea, vomiting, bloating, diarrhea, constipation, hematemesis, melena, hematochezia, jaundice or hemorrhoids Genitourinary: Denies dysuria, frequency, urgency, nocturia, hesitancy, discharge, hematuria or flank pain Musculoskeletal: Denies arthralgia, myalgia, stiffness, Jt. Swelling, pain, limp or strain/sprain. Denies Falls. Skin: Denies puritis, rash, hives, warts, acne, eczema or change in skin lesion Neuro: No weakness, tremor, incoordination, spasms, paresthesia or pain Psychiatric: Denies confusion, memory loss or sensory loss. Denies Depression. Endocrine: Denies change in weight, skin, hair change, nocturia, and paresthesia, diabetic polys, visual blurring or hyper / hypo glycemic episodes.  Heme/Lymph: No excessive bleeding, bruising or enlarged lymph nodes.  Physical Exam  BP 124/78 mmHg  Pulse 80  Temp(Src) 97.7 F (36.5 C)  Resp 16  Ht 5\' 10"  (1.778 m)  Wt 211 lb 3.2 oz (95.8 kg)  BMI 30.30 kg/m2  General Appearance: Well nourished &  in no apparent distress. Eyes: PERRLA, EOMs,  conjunctiva no swelling or erythema, normal fundi and vessels. Sinuses: No frontal/maxillary tenderness ENT/Mouth: EACs patent / TMs  nl. Nares clear without erythema, swelling, mucoid exudates. Oral hygiene is good. No erythema, swelling, or exudate. Tongue normal, non-obstructing. Tonsils not swollen or erythematous. Hearing normal.  Neck: Supple, thyroid normal. No bruits, nodes or JVD. Respiratory: Respiratory effort normal.  BS equal and clear bilateral without rales, rhonci, wheezing or stridor. Cardio: Heart sounds are normal with regular rate and rhythm and no murmurs, rubs or gallops. Peripheral pulses are normal and equal bilaterally without edema. No aortic or femoral bruits. Chest: symmetric with normal excursions and percussion.  Abdomen: Flat, soft, with bowel sounds. Nontender, no guarding, rebound, hernias, masses, or organomegaly.  Lymphatics: Non tender without lymphadenopathy.  Genitourinary: No hernias.Testes nl. DRE - prostate nl for age - smooth & firm w/o nodules. Musculoskeletal: Full ROM all peripheral extremities, joint stability, 5/5 strength, and normal gait. Skin: Warm and dry without rashes, lesions, cyanosis, clubbing or  ecchymosis.  Neuro: Cranial nerves intact, reflexes equal bilaterally. Normal muscle tone, no cerebellar symptoms. Sensation intact.  Pysch: Alert and oriented X 3 with normal affect, insight and judgment appropriate.  Patient reports recently feeling somewhat apathetic and melancholie presumed related to the frustrations of dealing with his mother's worsening SDAT.   Assessment and Plan  1. Encounter for general adult medical examination with abnormal findings   2. Essential hypertension   3. Hyperlipidemia   4. Prediabetes   5. Vitamin D deficiency   6. Coronary artery disease involving native coronary artery of native heart without angina pectoris   7. CKD (chronic kidney disease) stage 2, GFR 60-89 ml/min   8. Screening for  rectal cancer   9. Prostate cancer screening   10. Other fatigue   11. Medication management   12. BMI 30.85,adult   13. Morbid obesity, unspecified obesity type ([place])   14. Situational Depression  - Rx Sertraline 50 mg & f/u 6 weeks OV   Continue prudent diet as discussed, weight control, BP monitoring, regular exercise, and medications as discussed.  Discussed med effects and SE's. Routine screening labs and tests as requested with regular follow-up as recommended.  Over 40 minutes of exam, counseling &  chart review was performed

## 2014-10-23 NOTE — Patient Instructions (Signed)
Recommend Adult Low Dose Aspirin or   coated  Aspirin 81 mg daily   To reduce risk of Colon Cancer 20 %,   Skin Cancer 26 % ,   Melanoma 46%   and   Pancreatic cancer 60%   ++++++++++++++++++++++++++++++++++++++++++++++++++++++  Vitamin D goal   is between 70-100.   Please make sure that you are taking your Vitamin D as directed.   It is very important as a natural anti-inflammatory   helping hair, skin, and nails, as well as reducing stroke and heart attack risk.   It helps your bones and helps with mood.  It also decreases numerous cancer risks so please take it as directed.   Low Vit D is associated with a 200-300% higher risk for CANCER   and 200-300% higher risk for HEART   ATTACK  &  STROKE.   ......................................  It is also associated with higher death rate at younger ages,   autoimmune diseases like Rheumatoid arthritis, Lupus, Multiple Sclerosis.     Also many other serious conditions, like depression, Alzheimer's  Dementia, infertility, muscle aches, fatigue, fibromyalgia - just to name a few.  ++++++++++++++++++++++++++++++++++++++++++++++++  Recommend the book "The END of DIETING" by Dr Joel Fuhrman   & the book "The END of DIABETES " by Dr Joel Fuhrman  At [employer] - get book & Audio CD's     Being diabetic has a  300% increased risk for heart attack, stroke, cancer, and alzheimer- type vascular dementia. It is very important that you work harder with diet by avoiding all foods that are white. Avoid white rice (brown & wild rice is OK), white potatoes (sweetpotatoes in moderation is OK), White bread or wheat bread or anything made out of white flour like bagels, donuts, rolls, buns, biscuits, cakes, pastries, cookies, pizza crust, and pasta (made from white flour & egg whites) - vegetarian pasta or spinach or wheat pasta is OK. Multigrain breads like Arnold's or [employer], or multigrain sandwich thins or flatbreads.  Diet,  exercise and weight loss can reverse and cure diabetes in the early stages.  Diet, exercise and weight loss is very important in the control and prevention of complications of diabetes which affects every system in your body, ie. Brain - dementia/stroke, eyes - glaucoma/blindness, heart - heart attack/heart failure, kidneys - dialysis, stomach - gastric paralysis, intestines - malabsorption, nerves - severe painful neuritis, circulation - gangrene & loss of a leg(s), and finally cancer and Alzheimers.    I recommend avoid fried & greasy foods,  sweets/candy, white rice (brown or wild rice or Quinoa is OK), white potatoes (sweet potatoes are OK) - anything made from white flour - bagels, doughnuts, rolls, buns, biscuits,white and wheat breads, pizza crust and traditional pasta made of white flour & egg white(vegetarian pasta or spinach or wheat pasta is OK).  Multi-grain bread is OK - like multi-grain flat bread or sandwich thins. Avoid alcohol in excess. Exercise is also important.    Eat all the vegetables you want - avoid meat, especially red meat and dairy - especially cheese.  Cheese is the most concentrated form of trans-fats which is the worst thing to clog up our arteries. Veggie cheese is OK which can be found in the fresh produce section at [employer] or [employer] or [employer]  ++++++++++++++++++++++++++++++++++++++++++++++++++ DASH Eating Plan  DASH stands for "Dietary Approaches to Stop Hypertension."   The DASH eating plan is a healthy eating plan that has been shown to reduce high   blood pressure (hypertension). Additional health benefits Kunz include reducing the risk of type 2 diabetes mellitus, heart disease, and stroke. The DASH eating plan Poet also help with weight loss.  WHAT DO I NEED TO KNOW ABOUT THE DASH EATING PLAN? For the DASH eating plan, you will follow these general guidelines:  Choose foods with a percent daily value for sodium of less than 5% (as listed on the food  label).  Use salt-free seasonings or herbs instead of table salt or sea salt.  Check with your health care provider or pharmacist before using salt substitutes.  Eat lower-sodium products, often labeled as "lower sodium" or "no salt added."  Eat fresh foods.  Eat more vegetables, fruits, and low-fat dairy products.    Choose whole grains. Look for the word "whole" as the first word in the ingredient list.  Choose fish   Limit sweets, desserts, sugars, and sugary drinks.  Choose heart-healthy fats.  Eat veggie cheese   Eat more home-cooked food and less restaurant, buffet, and fast food.  Limit fried foods.  Cook foods using methods other than frying.  Limit canned vegetables. If you do use them, rinse them well to decrease the sodium.  When eating at a restaurant, ask that your food be prepared with less salt, or no salt if possible.                      WHAT FOODS CAN I EAT?  Seek help from a dietitian for individual calorie needs. Grains Whole grain or whole wheat bread. Brown rice. Whole grain or whole wheat pasta. Quinoa, bulgur, and whole grain cereals. Low-sodium cereals. Corn or whole wheat flour tortillas. Whole grain cornbread. Whole grain crackers. Low-sodium crackers.  Vegetables Fresh or frozen vegetables (raw, steamed, roasted, or grilled). Low-sodium or reduced-sodium tomato and vegetable juices. Low-sodium or reduced-sodium tomato sauce and paste. Low-sodium or reduced-sodium canned vegetables.   Fruits All fresh, canned (in natural juice), or frozen fruits.  Meat and Other Protein Products  All fish and seafood.  Dried beans, peas, or lentils. Unsalted nuts and seeds. Unsalted canned beans. Dairy Low-fat dairy products, such as skim or 1% milk, 2% or reduced-fat cheeses, low-fat ricotta or cottage cheese, or plain low-fat yogurt. Low-sodium or reduced-sodium cheeses.  Fats and Oils Tub margarines without trans fats. Light or reduced-fat mayonnaise  and salad dressings (reduced sodium). Avocado. Safflower, olive, or canola oils. Natural peanut or almond butter.  Other Unsalted popcorn and pretzels. The items listed above Thueson not be a complete list of recommended foods or beverages. Contact your dietitian for more options.  +++++++++++++++++++++++++++++++++++++++++++  WHAT FOODS ARE NOT RECOMMENDED?  Grains/ White flour or wheat flour  White bread. White pasta. White rice. Refined cornbread. Bagels and croissants. Crackers that contain trans fat.  Vegetables  Creamed or fried vegetables. Vegetables in a . Regular canned vegetables. Regular canned tomato sauce and paste. Regular tomato and vegetable juices.  Fruits Dried fruits. Canned fruit in light or heavy syrup. Fruit juice.  Meat and Other Protein Products Meat in general. Fatty cuts of meat. Ribs, chicken wings, bacon, sausage, bologna, salami, chitterlings, fatback, hot dogs, bratwurst, and packaged luncheon meats. Salted nuts and seeds. Canned beans with salt.  Dairy Whole or 2% milk, cream, half-and-half, and cream cheese. Whole-fat or sweetened yogurt. Full-fat cheeses or blue cheese. Nondairy creamers and whipped toppings. Processed cheese, cheese spreads, or cheese curds.  Condiments Onion and garlic salt, seasoned salt, table salt, and sea  salt. Canned and packaged gravies. Worcestershire sauce. Tartar sauce. Barbecue sauce. Teriyaki sauce. Soy sauce, including reduced sodium. Steak sauce. Fish sauce. Oyster sauce. Cocktail sauce. Horseradish. Ketchup and mustard. Meat flavorings and tenderizers. Bouillon cubes. Hot sauce. Tabasco sauce. Marinades. Taco seasonings. Relishes.  Fats and Oils Butter, stick margarine, lard, shortening, ghee, and bacon fat. Coconut, palm kernel, or palm oils. Regular salad dressings.  Pickles and olives. Salted popcorn and pretzels. The items listed above may not be a complete list of foods and beverages to avoid.   Preventive Care for  Adults  A healthy lifestyle and preventive care can promote health and wellness. Preventive health guidelines for men include the following key practices:  A routine yearly physical is a good way to check with your health care provider about your health and preventative screening. It is a chance to share any concerns and updates on your health and to receive a thorough exam.  Visit your dentist for a routine exam and preventative care every 6 months. Brush your teeth twice a day and floss once a day. Good oral hygiene prevents tooth decay and gum disease.  The frequency of eye exams is based on your age, health, family medical history, use of contact lenses, and other factors. Follow your health care provider's recommendations for frequency of eye exams.  Eat a healthy diet. Foods such as vegetables, fruits, whole grains, low-fat dairy products, and lean protein foods contain the nutrients you need without too many calories. Decrease your intake of foods high in solid fats, added sugars, and salt. Eat the right amount of calories for you.Get information about a proper diet from your health care provider, if necessary.  Regular physical exercise is one of the most important things you can do for your health. Most adults should get at least 150 minutes of moderate-intensity exercise (any activity that increases your heart rate and causes you to sweat) each week. In addition, most adults need muscle-strengthening exercises on 2 or more days a week.  Maintain a healthy weight. The body mass index (BMI) is a screening tool to identify possible weight problems. It provides an estimate of body fat based on height and weight. Your health care provider can find your BMI and can help you achieve or maintain a healthy weight.For adults 20 years and older:  A BMI below 18.5 is considered underweight.  A BMI of 18.5 to 24.9 is normal.  A BMI of 25 to 29.9 is considered overweight.  A BMI of 30 and above  is considered obese.  Maintain normal blood lipids and cholesterol levels by exercising and minimizing your intake of saturated fat. Eat a balanced diet with plenty of fruit and vegetables. Blood tests for lipids and cholesterol should begin at age 20 and be repeated every 5 years. If your lipid or cholesterol levels are high, you are over 50, or you are at high risk for heart disease, you may need your cholesterol levels checked more frequently.Ongoing high lipid and cholesterol levels should be treated with medicines if diet and exercise are not working.  If you smoke, find out from your health care provider how to quit. If you do not use tobacco, do not start.  Lung cancer screening is recommended for adults aged 55-80 years who are at high risk for developing lung cancer because of a history of smoking. A yearly low-dose CT scan of the lungs is recommended for people who have at least a 30-pack-year history of smoking and are a   current smoker or have quit within the past 15 years. A pack year of smoking is smoking an average of 1 pack of cigarettes a day for 1 year (for example: 1 pack a day for 30 years or 2 packs a day for 15 years). Yearly screening should continue until the smoker has stopped smoking for at least 15 years. Yearly screening should be stopped for people who develop a health problem that would prevent them from having lung cancer treatment.  If you choose to drink alcohol, do not have more than 2 drinks per day. One drink is considered to be 12 ounces (355 mL) of beer, 5 ounces (148 mL) of wine, or 1.5 ounces (44 mL) of liquor.  Avoid use of street drugs. Do not share needles with anyone. Ask for help if you need support or instructions about stopping the use of drugs.  High blood pressure causes heart disease and increases the risk of stroke. Your blood pressure should be checked at least every 1-2 years. Ongoing high blood pressure should be treated with medicines, if weight loss  and exercise are not effective.  If you are 40-27 years old, ask your health care provider if you should take aspirin to prevent heart disease.  Diabetes screening involves taking a blood sample to check your fasting blood sugar level. This should be done once every 3 years, after age 23, if you are within normal weight and without risk factors for diabetes. Testing should be considered at a younger age or be carried out more frequently if you are overweight and have at least 1 risk factor for diabetes.  Colorectal cancer can be detected and often prevented. Most routine colorectal cancer screening begins at the age of 15 and continues through age 22. However, your health care provider may recommend screening at an earlier age if you have risk factors for colon cancer. On a yearly basis, your health care provider may provide home test kits to check for hidden blood in the stool. Use of a small camera at the end of a tube to directly examine the colon (sigmoidoscopy or colonoscopy) can detect the earliest forms of colorectal cancer. Talk to your health care provider about this at age 72, when routine screening begins. Direct exam of the colon should be repeated every 5-10 years through age 19, unless early forms of precancerous polyps or small growths are found.   Talk with your health care provider about prostate cancer screening.  Testicular cancer screening isrecommended for adult males. Screening includes self-exam, a health care provider exam, and other screening tests. Consult with your health care provider about any symptoms you have or any concerns you have about testicular cancer.  Use sunscreen. Apply sunscreen liberally and repeatedly throughout the day. You should seek shade when your shadow is shorter than you. Protect yourself by wearing long sleeves, pants, a wide-brimmed hat, and sunglasses year round, whenever you are outdoors.  Once a month, do a whole-body skin exam, using a mirror to  look at the skin on your back. Tell your health care provider about new moles, moles that have irregular borders, moles that are larger than a pencil eraser, or moles that have changed in shape or color.  Stay current with required vaccines (immunizations).  Influenza vaccine. All adults should be immunized every year.  Tetanus, diphtheria, and acellular pertussis (Td, Tdap) vaccine. An adult who has not previously received Tdap or who does not know his vaccine status should receive 1 dose of  Tdap. This initial dose should be followed by tetanus and diphtheria toxoids (Td) booster doses every 10 years. Adults with an unknown or incomplete history of completing a 3-dose immunization series with Td-containing vaccines should begin or complete a primary immunization series including a Tdap dose. Adults should receive a Td booster every 10 years.  Varicella vaccine. An adult without evidence of immunity to varicella should receive 2 doses or a second dose if he has previously received 1 dose.  Human papillomavirus (HPV) vaccine. Males aged 79-21 years who have not received the vaccine previously should receive the 3-dose series. Males aged 22-26 years may be immunized. Immunization is recommended through the age of 62 years for any male who has sex with males and did not get any or all doses earlier. Immunization is recommended for any person with an immunocompromised condition through the age of 66 years if he did not get any or all doses earlier. During the 3-dose series, the second dose should be obtained 4-8 weeks after the first dose. The third dose should be obtained 24 weeks after the first dose and 16 weeks after the second dose.  Zoster vaccine. One dose is recommended for adults aged 49 years or older unless certain conditions are present.    PREVNAR  - Pneumococcal 13-valent conjugate (PCV13) vaccine. When indicated, a person who is uncertain of his immunization history and has no record of  immunization should receive the PCV13 vaccine. An adult aged 42 years or older who has certain medical conditions and has not been previously immunized should receive 1 dose of PCV13 vaccine. This PCV13 should be followed with a dose of pneumococcal polysaccharide (PPSV23) vaccine. The PPSV23 vaccine dose should be obtained at least 8 weeks after the dose of PCV13 vaccine. An adult aged 75 years or older who has certain medical conditions and previously received 1 or more doses of PPSV23 vaccine should receive 1 dose of PCV13. The PCV13 vaccine dose should be obtained 1 or more years after the last PPSV23 vaccine dose.    PNEUMOVAX - Pneumococcal polysaccharide (PPSV23) vaccine. When PCV13 is also indicated, PCV13 should be obtained first. All adults aged 46 years and older should be immunized. An adult younger than age 39 years who has certain medical conditions should be immunized. Any person who resides in a nursing home or long-term care facility should be immunized. An adult smoker should be immunized. People with an immunocompromised condition and certain other conditions should receive both PCV13 and PPSV23 vaccines. People with human immunodeficiency virus (HIV) infection should be immunized as soon as possible after diagnosis. Immunization during chemotherapy or radiation therapy should be avoided. Routine use of PPSV23 vaccine is not recommended for American Indians, [place], or people younger than 65 years unless there are medical conditions that require PPSV23 vaccine. When indicated, people who have unknown immunization and have no record of immunization should receive PPSV23 vaccine. One-time revaccination 5 years after the first dose of PPSV23 is recommended for people aged 19-64 years who have chronic kidney failure, nephrotic syndrome, asplenia, or immunocompromised conditions. People who received 1-2 doses of PPSV23 before age 25 years should receive another dose of PPSV23 vaccine at age  52 years or later if at least 5 years have passed since the previous dose. Doses of PPSV23 are not needed for people immunized with PPSV23 at or after age 60 years.    Hepatitis A vaccine. Adults who wish to be protected from this disease, have certain high-risk conditions,  work with hepatitis A-infected animals, work in hepatitis A research labs, or travel to or work in countries with a high rate of hepatitis A should be immunized. Adults who were previously unvaccinated and who anticipate close contact with an international adoptee during the first 60 days after arrival in the [place] from a country with a high rate of hepatitis A should be immunized.    Hepatitis B vaccine. Adults should be immunized if they wish to be protected from this disease, have certain high-risk conditions, may be exposed to blood or other infectious body fluids, are household contacts or sex partners of hepatitis B positive people, are clients or workers in certain care facilities, or travel to or work in countries with a high rate of hepatitis B.   Preventive Service / Frequency   Ages 16 to 37  Blood pressure check.  Lipid and cholesterol check  Lung cancer screening. / Every year if you are aged 78-80 years and have a 30-pack-year history of smoking and currently smoke or have quit within the past 15 years. Yearly screening is stopped once you have quit smoking for at least 15 years or develop a health problem that would prevent you from having lung cancer treatment.  Fecal occult blood test (FOBT) of stool. / Every year beginning at age 42 and continuing until age 57. You may not have to do this test if you get a colonoscopy every 10 years.  Flexible sigmoidoscopy** or colonoscopy.** / Every 5 years for a flexible sigmoidoscopy or every 10 years for a colonoscopy beginning at age 73 and continuing until age 35. Screening for abdominal aortic aneurysm (AAA)  by ultrasound is recommended for people who  have history of high blood pressure or who are current or former smokers.

## 2014-10-24 LAB — URINALYSIS, ROUTINE W REFLEX MICROSCOPIC
BILIRUBIN URINE: NEGATIVE
Glucose, UA: NEGATIVE
Hgb urine dipstick: NEGATIVE
Ketones, ur: NEGATIVE
LEUKOCYTES UA: NEGATIVE
Nitrite: NEGATIVE
PROTEIN: NEGATIVE
Specific Gravity, Urine: 1.01 (ref 1.001–1.035)
pH: 5.5 (ref 5.0–8.0)

## 2014-10-24 LAB — MICROALBUMIN / CREATININE URINE RATIO: CREATININE, URINE: 89 mg/dL (ref 20–370)

## 2014-10-24 LAB — VITAMIN B12: Vitamin B-12: 463 pg/mL (ref 211–911)

## 2014-10-24 LAB — INSULIN, RANDOM: Insulin: 7.6 u[IU]/mL (ref 2.0–19.6)

## 2014-10-24 LAB — TESTOSTERONE: TESTOSTERONE: 552 ng/dL (ref 300–890)

## 2014-10-24 LAB — TSH: TSH: 1.576 u[IU]/mL (ref 0.350–4.500)

## 2014-10-24 LAB — PSA: PSA: 0.77 ng/mL (ref <=4.00)

## 2014-10-24 LAB — VITAMIN D 25 HYDROXY (VIT D DEFICIENCY, FRACTURES): VIT D 25 HYDROXY: 67 ng/mL (ref 30–100)

## 2014-11-27 ENCOUNTER — Other Ambulatory Visit: Payer: Self-pay | Admitting: Internal Medicine

## 2014-11-27 DIAGNOSIS — G4700 Insomnia, unspecified: Secondary | ICD-10-CM

## 2014-12-04 ENCOUNTER — Ambulatory Visit (INDEPENDENT_AMBULATORY_CARE_PROVIDER_SITE_OTHER): Payer: Managed Care, Other (non HMO) | Admitting: Internal Medicine

## 2014-12-04 ENCOUNTER — Encounter: Payer: Self-pay | Admitting: Internal Medicine

## 2014-12-04 VITALS — BP 136/88 | HR 66 | Temp 97.7°F | Resp 18 | Ht 70.0 in | Wt 208.4 lb

## 2014-12-04 DIAGNOSIS — G2581 Restless legs syndrome: Secondary | ICD-10-CM

## 2014-12-04 DIAGNOSIS — G4700 Insomnia, unspecified: Secondary | ICD-10-CM

## 2014-12-04 DIAGNOSIS — G4710 Hypersomnia, unspecified: Secondary | ICD-10-CM

## 2014-12-04 DIAGNOSIS — G4730 Sleep apnea, unspecified: Secondary | ICD-10-CM | POA: Diagnosis not present

## 2014-12-04 DIAGNOSIS — Z6830 Body mass index (BMI) 30.0-30.9, adult: Secondary | ICD-10-CM

## 2014-12-04 DIAGNOSIS — F329 Major depressive disorder, single episode, unspecified: Secondary | ICD-10-CM

## 2014-12-04 DIAGNOSIS — F32A Depression, unspecified: Secondary | ICD-10-CM

## 2014-12-04 MED ORDER — GABAPENTIN 300 MG PO CAPS: 300.0000 mg | ORAL_CAPSULE | Freq: Three times a day (TID) | ORAL | Status: DC

## 2014-12-04 MED ORDER — SERTRALINE HCL 100 MG PO TABS: ORAL_TABLET | ORAL | Status: DC

## 2014-12-04 NOTE — Progress Notes (Signed)
   Subjective:    Patient ID: Bryan [name], male    DOB: 1950-05-27, 64 y.o.   MRN: [id number]  HPI  Patient presents for f/u after 1 month on Sertraline and reports that he feels about the same wrt the burden dealing with his mother's worsening SDAT. He also reports per wife problems with poor sleep hygiene, snoring and RLS. Not certain as to whether he's actually demonstrated apneic spells.   Meds, Allergies, PMH, FH/SH reviewed & unchanged.   Review of Systems  10 point systems review negative except as above.    Objective:   Physical Exam  BP 136/88 mmHg  Pulse 66  Temp(Src) 97.7 F (36.5 C)  Resp 18  Ht 5\' 10"  (1.778 m)  Wt 208 lb 6.4 oz (94.53 kg)  BMI 29.90 kg/m2  HEENT Post pharynx mildly compromised by narrowed airway  Neck supple  Chest - clear Cor - RRR w/o sig MGR  MS/N - WNL    Assessment & Plan:   1. Hypersomnia with sleep apnea - Nocturnal polysomnography; Future  2. Depression  - sertraline (ZOLOFT) 100 MG tablet; Take 1 tablet daily for mood  Dispense: 90 tablet; Refill: 1  3. RLS (restless legs syndrome)  - gabapentin (NEURONTIN) 300 MG capsule; Take 1 capsule (300 mg total) by mouth 3 (three) times daily.  Dispense: 90 capsule; Refill: 2  4. Insomnia  - gabapentin (NEURONTIN) 300 MG capsule; Take 1 capsule (300 mg total) by mouth 3 (three) times daily.  Dispense: 90 capsule; Refill: 2  - Discussed meds/SE's - ROV 1 mo

## 2015-01-10 ENCOUNTER — Ambulatory Visit (INDEPENDENT_AMBULATORY_CARE_PROVIDER_SITE_OTHER): Payer: Managed Care, Other (non HMO) | Admitting: Internal Medicine

## 2015-01-10 ENCOUNTER — Encounter: Payer: Self-pay | Admitting: Internal Medicine

## 2015-01-10 VITALS — BP 112/84 | HR 76 | Temp 97.7°F | Resp 16 | Ht 70.5 in | Wt 206.7 lb

## 2015-01-10 DIAGNOSIS — I10 Essential (primary) hypertension: Secondary | ICD-10-CM | POA: Diagnosis not present

## 2015-01-10 DIAGNOSIS — G4700 Insomnia, unspecified: Secondary | ICD-10-CM

## 2015-01-10 LAB — TB SKIN TEST
Induration: 0 mm
TB Skin Test: NEGATIVE

## 2015-01-10 NOTE — Progress Notes (Signed)
  Subjective:    Patient ID: Bryan [name], male    DOB: [birth year], 65 y.o.   MRN: [id number]  HPI  Patient for f/u of increasing Zoloft from 50 to 100 mg and after 3-4 day experienced some dysphoria and dropped back to the 50 mg dose and says he feels fine now  Like "I feel normal again and the dark cloud over my head is gone". Also is sleeping better with the Gabapentin and apparently no more RLS sx's.     Medication Sig  . clonazePAM  2 MG tablet take 1/2 to 1 tablet by mouth at bedtime if needed for sleep MAXIUMUM OF 5 TIMES A WEEK  . clopidogrel  75 MG tablet Take 1 tablet (75 mg total) by mouth daily.  Marland Kitchen ezetimibe  10 MG tablet Take 1 tablet (10 mg total) by mouth daily.  Marland Kitchen gabapentin  300 MG cap Take 1 capsule (300 mg total) by mouth 3 (three) times daily. - takes 1 tab Qhs  . lisinopril ( 20 MG tablet Take 1 tablet (20 mg total) by mouth daily.  . meloxicam  15 MG tablet Take 15 mg by mouth daily.  . nebivolol  10 MG tablet Take 1 tablet (10 mg total) by mouth daily.  . rosuvastatin  10 MG tablet Take 1 tablet (10 mg total) by mouth daily.  . sertraline  100 MG tablet Take 1 tablet daily for mood - takes 1/2 tab   . Vitamin D 50000 UNITS Take 1 capsule (50,000 Units total) by mouth daily.   Allergies  Allergen Reactions  . Amoxicillin Hives and Swelling  . Lunesta [Eszopiclone]     Restless legs  . Penicillins Swelling   Past Medical History  Diagnosis Date  . Hyperlipidemia   . Hypertension   . Hernia   . Diverticulosis   . Vitamin D deficiency   . GERD (gastroesophageal reflux disease)   . CAD (coronary artery disease), native coronary artery     Cath 04/19/2001  normal Left main, moderate proximal disease, 80% stenosis distal LAD, 99% stenosis proximal OM 1, 60% stenosis mid RCA, 70% stenosis ostial mid PDA  PCI with bare metal stent of OM1 same date 3.5 x 18 mm Multilink    . History of PSVT     Ablation done in 2004 at [facility]   . Symptomatic PVCs    Review  of Systems  10 point systems review negative except as above.    Objective:   Physical Exam  BP 112/84 mmHg  Pulse 76  Temp(Src) 97.7 F (36.5 C)  Resp 16  Ht 5' 10.5" (1.791 m)  Wt 206 lb 11.2 oz (93.759 kg)  BMI 29.23 kg/m2  HEENT - WNL. Neck - supple.  Chest - Clear  Cor - Nl HS. RRR MS- FROM w/o deformities. Muscle power, tone and bulk Nl. Gait Nl. Neuro - r Nl w/o focal abnormalities. Psyche - Mental status normal & appropriate.  No delusions, ideations or obvious mood abnormalities.    Assessment & Plan:   1. Essential hypertension   2. Insomnia  - discussed meds/SE's & continue same

## 2015-01-20 ENCOUNTER — Other Ambulatory Visit: Payer: Self-pay | Admitting: Internal Medicine

## 2015-02-05 ENCOUNTER — Ambulatory Visit: Payer: Self-pay | Admitting: Internal Medicine

## 2015-04-02 ENCOUNTER — Other Ambulatory Visit: Payer: Self-pay | Admitting: Internal Medicine

## 2015-04-02 NOTE — Telephone Encounter (Signed)
RX CALLED INTO [facility].

## 2015-04-30 ENCOUNTER — Other Ambulatory Visit: Payer: Self-pay | Admitting: Internal Medicine

## 2015-05-05 ENCOUNTER — Encounter: Payer: Self-pay | Admitting: Internal Medicine

## 2015-05-05 NOTE — Patient Instructions (Signed)

## 2015-05-05 NOTE — Progress Notes (Signed)
Patient ID: Bryan [name], male   DOB: Dec 25, 1950, 65 y.o.   MRN: [id number]   This very nice 65 y.o. [name]  presents for  follow up with Hypertension, Hyperlipidemia, Pre-Diabetes and Vitamin D Deficiency. Patient also c/o occas HB due to dietary indiscretions & having a several month hx/o R Abd discomfort/ache about 30 min PC. No N/V or change in BM's.    Patient is treated for HTN & BP has been controlled at home. Today's BP: 130/84 mmHg. Patient has had no complaints of any cardiac type chest pain, palpitations, dyspnea/orthopnea/PND, dizziness, claudication, or dependent edema.   Hyperlipidemia is controlled with diet & meds. Patient denies myalgias or other med SE's. Last Lipids were at goal with  Cholesterol 135; HDL 52; LDL 57; Triglycerides 131 on 10/23/2014.   Also, the patient has history of PreDiabetes and has had no symptoms of reactive hypoglycemia, diabetic polys, paresthesias or visual blurring.  Last A1c was  5.6% on 10/23/2014.   Further, the patient also has history of Vitamin D Deficiency and supplements vitamin D without any suspected side-effects. Last vitamin D was 67 on 10/23/2014.   Medication Sig  . clonazePAM  2 MG  take 1/2 to 1 tab at bedtime if needed for sleep MAXIUMUM OF 5 TIMES A WEEK  . clopidogrel  75 MG take 1 tab once daily  . ezetimibe  10 MG Take 1 tab daily.  Marland Kitchen gabapentin  300 MG  Take 1 cap 3  times daily.  Marland Kitchen lisinopril20 MG  take 1 tab once daily  . meloxicam  15 MG  Take  daily.  . nebivolol  10 MG  Take 1 tab daily.  . rosuvastatin  10 MG  Take 1 tab daily.  . sertraline  50 MG  take 1 tab once daily  . Vitamin D 50000 UNITS  Take 1 cap  daily.   Allergies  Allergen Reactions  . Amoxicillin Hives and Swelling  . Lunesta [Eszopiclone]     Restless legs  . Penicillins Swelling   PMHx:   Past Medical History  Diagnosis Date  . Hyperlipidemia   . Hypertension   . Hernia   . Diverticulosis   . Vitamin D deficiency   . GERD (gastroesophageal  reflux disease)   . CAD (coronary artery disease), native coronary artery     Cath 04/19/2001  normal Left main, moderate proximal disease, 80% stenosis distal LAD, 99% stenosis proximal OM 1, 60% stenosis mid RCA, 70% stenosis ostial mid PDA  PCI with bare metal stent of OM1 same date 3.5 x 18 mm Multilink    . History of PSVT     Ablation done in 2004 at [facility]   . Symptomatic PVCs    Immunization History  Administered Date(s) Administered  . Influenza Split 10/13/2013, 10/23/2014  . Influenza,inj,quad, With Preservative 01/13/2013  . Influenza-Unspecified 10/07/2011  . PPD Test 10/13/2013, 10/23/2014  . Pneumococcal Conjugate-13 10/13/2013  . Pneumococcal Polysaccharide-23 10/23/2014  . Td 01/06/2006  . Zoster 09/22/2011   Past Surgical History  Procedure Laterality Date  . Appendectomy    . Achilles tendon repair      left  . Cardiac electrophysiology mapping and ablation     FHx:    Reviewed / unchanged  SHx:    Reviewed / unchanged  Systems Review:  Constitutional: Denies fever, chills, wt changes, headaches, insomnia, fatigue, night sweats, change in appetite. Eyes: Denies redness, blurred vision, diplopia, discharge, itchy, watery eyes.  ENT: Denies discharge,  congestion, post nasal drip, epistaxis, sore throat, earache, hearing loss, dental pain, tinnitus, vertigo, sinus pain, snoring.  CV: Denies chest pain, palpitations, irregular heartbeat, syncope, dyspnea, diaphoresis, orthopnea, PND, claudication or edema. Respiratory: denies cough, dyspnea, DOE, pleurisy, hoarseness, laryngitis, wheezing.  Gastrointestinal: Denies dysphagia, odynophagia, heartburn, reflux, water brash, abdominal pain or cramps, nausea, vomiting, bloating, diarrhea, constipation, hematemesis, melena, hematochezia  or hemorrhoids. Genitourinary: Denies dysuria, frequency, urgency, nocturia, hesitancy, discharge, hematuria or flank pain. Musculoskeletal: Denies arthralgias, myalgias,  stiffness, jt. swelling, pain, limping or strain/sprain.  Skin: Denies pruritus, rash, hives, warts, acne, eczema or change in skin lesion(s). Neuro: No weakness, tremor, incoordination, spasms, paresthesia or pain. Psychiatric: Denies confusion, memory loss or sensory loss. Endo: Denies change in weight, skin or hair change.  Heme/Lymph: No excessive bleeding, bruising or enlarged lymph nodes.  Physical Exam  BP 130/84 mmHg  Pulse 68  Temp(Src) 97.7 F (36.5 C)  Resp 16  Ht 5' 10.5" (1.791 m)  Wt 198 lb (89.812 kg)  BMI 28.00 kg/m2  Appears well nourished and in no distress. Eyes: PERRLA, EOMs, conjunctiva no swelling or erythema. Sinuses: No frontal/maxillary tenderness ENT/Mouth: EAC's clear, TM's nl w/o erythema, bulging. Nares clear w/o erythema, swelling, exudates. Oropharynx clear without erythema or exudates. Oral hygiene is good. Tongue normal, non obstructing. Hearing intact.  Neck: Supple. Thyroid nl. Car 2+/2+ without bruits, nodes or JVD. Chest: Respirations nl with BS clear & equal w/o rales, rhonchi, wheezing or stridor.  Cor: Heart sounds normal w/ regular rate and rhythm without sig. murmurs, gallops, clicks, or rubs. Peripheral pulses normal and equal  without edema.  Abdomen: Soft & bowel sounds normal. Sl tender  Mid R hypochoindrial area as well as mid to low R abdomen w/o guarding, rebound, hernias, masses, or organomegaly.  Lymphatics: Unremarkable.  Musculoskeletal: Full ROM all peripheral extremities, joint stability, 5/5 strength, and normal gait.  Skin: Warm, dry without exposed rashes, lesions or ecchymosis apparent.  Neuro: Cranial nerves intact, reflexes equal bilaterally. Sensory-motor testing grossly intact. Tendon reflexes grossly intact.  Pysch: Alert & oriented x 3.  Insight and judgement nl & appropriate. No ideations.  Assessment and Plan:  1. Essential hypertension  - TSH  2. Hyperlipidemia  - Lipid panel - TSH  3. Prediabetes  -  Hemoglobin A1c - Insulin, random  4. Vitamin D deficiency  - VITAMIN D 25 Hydroxy   5. Coronary artery disease involving native coronary artery of native heart without angina pectoris   6. Morbid obesity due to excess calories (HCC)   7. Abdominal Pain  - U/S Abdomen & Pelvis - Amylase - Hyoscyamine 0.125 mg #120 x 5 rf - 1-2 tabs qid ac&hs prn  8. Medication management  - CBC with Differential/Platelet - BASIC METABOLIC PANEL WITH GFR - Hepatic function panel - Magnesium   Recommended regular exercise, BP monitoring, weight control, and discussed med and SE's. Recommended labs to assess and monitor clinical status. Further disposition pending results of labs. Over 30 minutes of exam, counseling, chart review was performed

## 2015-05-07 ENCOUNTER — Ambulatory Visit (INDEPENDENT_AMBULATORY_CARE_PROVIDER_SITE_OTHER): Payer: Medicare Other | Admitting: Internal Medicine

## 2015-05-07 ENCOUNTER — Encounter: Payer: Self-pay | Admitting: Internal Medicine

## 2015-05-07 ENCOUNTER — Other Ambulatory Visit: Payer: Self-pay | Admitting: Internal Medicine

## 2015-05-07 VITALS — BP 130/84 | HR 68 | Temp 97.7°F | Resp 16 | Ht 70.5 in | Wt 198.0 lb

## 2015-05-07 DIAGNOSIS — I2510 Atherosclerotic heart disease of native coronary artery without angina pectoris: Secondary | ICD-10-CM | POA: Diagnosis not present

## 2015-05-07 DIAGNOSIS — Z79899 Other long term (current) drug therapy: Secondary | ICD-10-CM

## 2015-05-07 DIAGNOSIS — R7303 Prediabetes: Secondary | ICD-10-CM | POA: Diagnosis not present

## 2015-05-07 DIAGNOSIS — E559 Vitamin D deficiency, unspecified: Secondary | ICD-10-CM

## 2015-05-07 DIAGNOSIS — E785 Hyperlipidemia, unspecified: Secondary | ICD-10-CM | POA: Diagnosis not present

## 2015-05-07 DIAGNOSIS — R1031 Right lower quadrant pain: Secondary | ICD-10-CM | POA: Diagnosis not present

## 2015-05-07 DIAGNOSIS — I10 Essential (primary) hypertension: Secondary | ICD-10-CM | POA: Diagnosis not present

## 2015-05-07 DIAGNOSIS — R7309 Other abnormal glucose: Secondary | ICD-10-CM | POA: Diagnosis not present

## 2015-05-07 LAB — TSH: TSH: 1.23 m[IU]/L (ref 0.40–4.50)

## 2015-05-07 LAB — CBC WITH DIFFERENTIAL/PLATELET
Basophils Absolute: 0 cells/uL (ref 0–200)
Basophils Relative: 0 %
EOS ABS: 0 {cells}/uL — AB (ref 15–500)
EOS PCT: 0 %
HCT: 47.4 % (ref 38.5–50.0)
Hemoglobin: 16 g/dL (ref 13.2–17.1)
LYMPHS ABS: 1470 {cells}/uL (ref 850–3900)
Lymphocytes Relative: 14 %
MCH: 29.4 pg (ref 27.0–33.0)
MCHC: 33.8 g/dL (ref 32.0–36.0)
MCV: 87 fL (ref 80.0–100.0)
MONO ABS: 735 {cells}/uL (ref 200–950)
MPV: 10.6 fL (ref 7.5–12.5)
Monocytes Relative: 7 %
Neutro Abs: 8295 cells/uL — ABNORMAL HIGH (ref 1500–7800)
Neutrophils Relative %: 79 %
PLATELETS: 197 10*3/uL (ref 140–400)
RBC: 5.45 MIL/uL (ref 4.20–5.80)
RDW: 13.3 % (ref 11.0–15.0)
WBC: 10.5 10*3/uL (ref 3.8–10.8)

## 2015-05-07 LAB — BASIC METABOLIC PANEL WITH GFR
BUN: 17 mg/dL (ref 7–25)
CALCIUM: 9.2 mg/dL (ref 8.6–10.3)
CO2: 24 mmol/L (ref 20–31)
Chloride: 104 mmol/L (ref 98–110)
Creat: 0.92 mg/dL (ref 0.70–1.25)
GFR, EST NON AFRICAN AMERICAN: 88 mL/min (ref >=60)
Glucose, Bld: 98 mg/dL (ref 65–99)
Potassium: 4 mmol/L (ref 3.5–5.3)
SODIUM: 139 mmol/L (ref 135–146)

## 2015-05-07 LAB — HEPATIC FUNCTION PANEL
ALT: 24 U/L (ref 9–46)
AST: 19 U/L (ref 10–35)
Albumin: 4.3 g/dL (ref 3.6–5.1)
Alkaline Phosphatase: 52 U/L (ref 40–115)
BILIRUBIN DIRECT: 0.1 mg/dL (ref <=0.2)
BILIRUBIN INDIRECT: 0.6 mg/dL (ref 0.2–1.2)
TOTAL PROTEIN: 6.6 g/dL (ref 6.1–8.1)
Total Bilirubin: 0.7 mg/dL (ref 0.2–1.2)

## 2015-05-07 LAB — HEMOGLOBIN A1C
HEMOGLOBIN A1C: 5.3 % (ref <5.7)
Mean Plasma Glucose: 105 mg/dL

## 2015-05-07 LAB — LIPID PANEL
Cholesterol: 155 mg/dL (ref 125–200)
HDL: 63 mg/dL (ref >=40)
LDL CALC: 59 mg/dL (ref <130)
Total CHOL/HDL Ratio: 2.5 Ratio (ref <=5.0)
Triglycerides: 164 mg/dL — ABNORMAL HIGH (ref <150)
VLDL: 33 mg/dL — ABNORMAL HIGH (ref <30)

## 2015-05-07 LAB — MAGNESIUM: Magnesium: 1.9 mg/dL (ref 1.5–2.5)

## 2015-05-07 LAB — AMYLASE: AMYLASE: 29 U/L (ref 0–105)

## 2015-05-07 MED ORDER — HYOSCYAMINE SULFATE 0.125 MG PO TABS: ORAL_TABLET | ORAL | Status: DC

## 2015-05-08 LAB — VITAMIN D 25 HYDROXY (VIT D DEFICIENCY, FRACTURES): Vit D, 25-Hydroxy: 76 ng/mL (ref 30–100)

## 2015-05-08 LAB — INSULIN, RANDOM: INSULIN: 19.2 u[IU]/mL (ref 2.0–19.6)

## 2015-05-21 ENCOUNTER — Other Ambulatory Visit: Payer: Self-pay

## 2015-05-22 ENCOUNTER — Other Ambulatory Visit: Payer: Self-pay

## 2015-06-11 ENCOUNTER — Ambulatory Visit
Admission: RE | Admit: 2015-06-11 | Discharge: 2015-06-11 | Disposition: A | Discharge status: Home / Self Care | Payer: Medicare Other | Source: Ambulatory Visit | Attending: Internal Medicine | Admitting: Internal Medicine

## 2015-06-11 DIAGNOSIS — R1031 Right lower quadrant pain: Secondary | ICD-10-CM

## 2015-06-11 DIAGNOSIS — R1030 Lower abdominal pain, unspecified: Secondary | ICD-10-CM | POA: Diagnosis not present

## 2015-06-18 ENCOUNTER — Encounter: Payer: Self-pay | Admitting: Physician Assistant

## 2015-06-18 NOTE — Progress Notes (Signed)
CANCELED APPOINTMENT PER PATIENT

## 2015-07-15 ENCOUNTER — Other Ambulatory Visit: Payer: Self-pay | Admitting: Internal Medicine

## 2015-08-08 ENCOUNTER — Other Ambulatory Visit: Payer: Self-pay | Admitting: Internal Medicine

## 2015-08-08 ENCOUNTER — Other Ambulatory Visit: Payer: Self-pay | Admitting: Physician Assistant

## 2015-08-16 ENCOUNTER — Ambulatory Visit (INDEPENDENT_AMBULATORY_CARE_PROVIDER_SITE_OTHER): Payer: Medicare Other | Admitting: Internal Medicine

## 2015-08-16 ENCOUNTER — Encounter: Payer: Self-pay | Admitting: Internal Medicine

## 2015-08-16 VITALS — BP 126/64 | HR 70 | Temp 98.2°F | Resp 16 | Ht 70.5 in

## 2015-08-16 DIAGNOSIS — I2510 Atherosclerotic heart disease of native coronary artery without angina pectoris: Secondary | ICD-10-CM

## 2015-08-16 DIAGNOSIS — M5432 Sciatica, left side: Secondary | ICD-10-CM | POA: Diagnosis not present

## 2015-08-16 DIAGNOSIS — G4700 Insomnia, unspecified: Secondary | ICD-10-CM | POA: Diagnosis not present

## 2015-08-16 DIAGNOSIS — M76892 Other specified enthesopathies of left lower limb, excluding foot: Secondary | ICD-10-CM

## 2015-08-16 MED ORDER — MELOXICAM 15 MG PO TABS: 15.0000 mg | ORAL_TABLET | Freq: Every day | ORAL | 0 refills | Status: DC

## 2015-08-16 MED ORDER — PREDNISONE 20 MG PO TABS: ORAL_TABLET | ORAL | 0 refills | Status: DC

## 2015-08-16 NOTE — Progress Notes (Signed)
Subjective:    Patient ID: Bryan [name], male    DOB: [birth year], 65 y.o.   MRN: [id number]  HPI  Patient presents to the office for evaluation of left sided hip pain which has been increasing for the past year.  It is exacerbated from standing for long periods of time.  It does have some associated numbness.  This is intermittent in nature.  He reports that he has had some left sided low back pain especially with standing and activity.  He denies groin pain.  He does have lateral pain around the greater trochanter.  He has had a history of low back pain and muscle spasms.  He reports that he would take xanax prn for muscle spasms.  He reports that he would have to sleep on the floor sometimes to help with this.  He does sometimes feel like his leg is going to buckle.  Most often he does not have a tremendous issue.  No loss of bowel or bladder, no saddle anesthesia, no back surgeries, no history of personal cancer.  He cannot sleep.  He reports that he has been taking clonazepam for years.  He was limited to 5 pills a week and his prescription that was written for a 3 months supply is already gone 1.5 months early.  He is aware that this will not be refilled.  He is not exercising.    Review of Systems  Constitutional: Negative for chills, fatigue and fever.  Respiratory: Negative for chest tightness and shortness of breath.   Gastrointestinal: Negative for abdominal pain, nausea and vomiting.  Genitourinary: Negative for dysuria, flank pain, frequency, hematuria and urgency.  Musculoskeletal: Positive for back pain.  Neurological: Positive for weakness and numbness.  Psychiatric/Behavioral: Positive for sleep disturbance. Negative for behavioral problems. The patient is not nervous/anxious.        Objective:   Physical Exam  Constitutional: He is oriented to person, place, and time. He appears well-developed and well-nourished. No distress.  HENT:  Head: Normocephalic.  Mouth/Throat:  Oropharynx is clear and moist. No oropharyngeal exudate.  Eyes: Conjunctivae are normal. No scleral icterus.  Neck: Normal range of motion. Neck supple. No JVD present. No thyromegaly present.  Cardiovascular: Normal rate, regular rhythm, normal heart sounds and intact distal pulses.  Exam reveals no gallop and no friction rub.   No murmur heard. Pulmonary/Chest: Effort normal and breath sounds normal. No respiratory distress. He has no wheezes. He has no rales. He exhibits no tenderness.  Abdominal: Soft. Bowel sounds are normal. He exhibits no distension and no mass. There is no tenderness. There is no rebound and no guarding.  Musculoskeletal: Normal range of motion.  Patient rises slowly from sitting to standing.  They walk without an antalgic gait.  There is no evidence of erythema, ecchymosis, or gross deformity.  There is tenderness to palpation over left lateral hip over the subtrochanteric bursa.    Active ROM is full.  He is able to jump up and down.  Sensation to light touch is intact over all extremities.  Strength is symmetric and equal with only mild weakness with abduction.  Very poor flexibility in bilateral hamstrings.       Lymphadenopathy:    He has no cervical adenopathy.  Neurological: He is alert and oriented to person, place, and time. No cranial nerve deficit. Coordination normal.  Skin: Skin is warm and dry. No rash noted. He is not diaphoretic.  Psychiatric: He has a normal mood and  affect. His behavior is normal. Judgment and thought content normal.  Nursing note and vitals reviewed.   Vitals:   08/16/15 1639  BP: 126/64  Pulse: 70  Resp: 16  Temp: 98.2 F (36.8 C)          Assessment & Plan:    1. Sciatica of left side -prednisone -become more mobile at trade shows and avoid sitting for long periods of time -meloxicam when traveling or standing for long periods of time  2. Hip abductor tendinitis, left -may be causing a mild subtrochanteric bursitis  in the left hip.  Recommended increased stretching of the hamstrings, quads, and increased activity to help strengthen.     3.  Insomnia -patient is overusing clonazepam and was told that another prescription would not be given or filled by pharmacy.   -try belsomra 20 mg samples

## 2015-09-12 ENCOUNTER — Encounter: Payer: Self-pay | Admitting: Internal Medicine

## 2015-09-12 ENCOUNTER — Other Ambulatory Visit: Payer: Self-pay | Admitting: Internal Medicine

## 2015-09-12 MED ORDER — DOXEPIN HCL 6 MG PO TABS: 1.0000 | ORAL_TABLET | Freq: Every day | ORAL | 0 refills | Status: DC

## 2015-09-17 ENCOUNTER — Ambulatory Visit (INDEPENDENT_AMBULATORY_CARE_PROVIDER_SITE_OTHER): Payer: Medicare Other | Admitting: Internal Medicine

## 2015-09-17 ENCOUNTER — Ambulatory Visit (HOSPITAL_COMMUNITY)
Admission: RE | Admit: 2015-09-17 | Discharge: 2015-09-17 | Disposition: A | Discharge status: Home / Self Care | Payer: Medicare Other | Source: Ambulatory Visit | Attending: Internal Medicine | Admitting: Internal Medicine

## 2015-09-17 ENCOUNTER — Encounter: Payer: Self-pay | Admitting: Internal Medicine

## 2015-09-17 VITALS — BP 122/78 | HR 70 | Temp 98.2°F | Resp 18 | Ht 70.5 in | Wt 206.0 lb

## 2015-09-17 DIAGNOSIS — I2510 Atherosclerotic heart disease of native coronary artery without angina pectoris: Secondary | ICD-10-CM | POA: Diagnosis not present

## 2015-09-17 DIAGNOSIS — M546 Pain in thoracic spine: Secondary | ICD-10-CM

## 2015-09-17 DIAGNOSIS — M549 Dorsalgia, unspecified: Secondary | ICD-10-CM | POA: Insufficient documentation

## 2015-09-17 DIAGNOSIS — M25552 Pain in left hip: Secondary | ICD-10-CM

## 2015-09-17 MED ORDER — CYCLOBENZAPRINE HCL 10 MG PO TABS: 10.0000 mg | ORAL_TABLET | Freq: Three times a day (TID) | ORAL | 0 refills | Status: DC | PRN

## 2015-09-17 NOTE — Progress Notes (Signed)
Subjective:    Patient ID: Bryan [name], male    DOB: 05-07-1950, 65 y.o.   MRN: [id number]  HPI  Patient presents to the office for evaluation of left hip pain.  He reports that the hip has been bothering him for the past couple of weeks.  He also reports that he is having some pain in his thoracic spine.  He reports that this has also been going on for a week.  He reports that he did take prednisone a month ago and this helped a little bit.  He reports that he has been off of it for the last couple weeks.  He is taking the meloxicam daily.  He reports that he has also taken tylenol as well supplement.   He reports that there was no trigger to the pain in his thoracic spine.  He reports that it does bother him with deep breaths.  He reports that when he does try to turn his head it is bothersome as well.  No shortness of breath, no wheezing.  No history of cancer.  No recent surgery.  No recent long [name] travel.    Review of Systems  Cardiovascular: Negative for chest pain and palpitations.  Gastrointestinal: Negative for nausea and vomiting.  Musculoskeletal: Positive for back pain and myalgias. Negative for joint swelling.       Objective:   Physical Exam  Constitutional: He is oriented to person, place, and time. He appears well-developed and well-nourished. No distress.  HENT:  Head: Normocephalic.  Mouth/Throat: Oropharynx is clear and moist. No oropharyngeal exudate.  Eyes: Conjunctivae are normal. No scleral icterus.  Neck: Normal range of motion. Neck supple. No JVD present. No thyromegaly present.  Cardiovascular: Normal rate, regular rhythm, normal heart sounds and intact distal pulses.  Exam reveals no gallop and no friction rub.   No murmur heard. Pulmonary/Chest: Effort normal and breath sounds normal. No respiratory distress. He has no wheezes. He has no rales. He exhibits no tenderness.  Abdominal: Soft. Bowel sounds are normal. He exhibits no distension and no mass.  There is no tenderness. There is no rebound and no guarding.  Musculoskeletal: Normal range of motion.       Thoracic back: He exhibits tenderness, pain and spasm. He exhibits normal range of motion, no bony tenderness, no swelling, no edema, no deformity, no laceration and normal pulse.       Back:  Left hip with irritability with internal and external rotation.  Some tenderness to palpation of the lumbar paraspinal muscles.  Normal sensation to light touch of bilateral legs.  Normal knee flexion and knee extension.    Lymphadenopathy:    He has no cervical adenopathy.  Neurological: He is alert and oriented to person, place, and time. No cranial nerve deficit. Coordination normal.  Skin: No rash noted. He is not diaphoretic.  Nursing note and vitals reviewed.   Vitals:   09/17/15 0932  BP: 122/78  Pulse: 70  Resp: 18  Temp: 98.2 F (36.8 C)         Assessment & Plan:    1. Left hip pain -already on meloxicam -have completed a steroid taper with some relief but now painful again -suspect some left hip arthritis, but given reports of weakness needs more full lumbar imaging as this could be radiculopathy.   -no red flag symptoms -needs at least xray of left hip and lumbar spine which we will defer to ortho so as not to repeat imaging -  Ambulatory referral to Orthopedics  2. Left-sided thoracic back pain -doubt PE as wells score is 0.  He is currently on plavix as well.  No need for D-Dimer -CXR just to rule out Pneumonthoraxi but think that this is likely muscle spasm of the rhomboid given reproducibility of the pain. -flexeril at bedtime -heat -gentle stretching of the shoulders and rhomboids.  - DG Chest 2 View; Future

## 2015-09-18 DIAGNOSIS — M25512 Pain in left shoulder: Secondary | ICD-10-CM | POA: Diagnosis not present

## 2015-09-18 DIAGNOSIS — M7062 Trochanteric bursitis, left hip: Secondary | ICD-10-CM | POA: Diagnosis not present

## 2015-09-18 DIAGNOSIS — M4692 Unspecified inflammatory spondylopathy, cervical region: Secondary | ICD-10-CM | POA: Diagnosis not present

## 2015-09-18 DIAGNOSIS — M25552 Pain in left hip: Secondary | ICD-10-CM | POA: Diagnosis not present

## 2015-10-09 ENCOUNTER — Other Ambulatory Visit: Payer: Self-pay | Admitting: Internal Medicine

## 2015-10-09 DIAGNOSIS — M5442 Lumbago with sciatica, left side: Secondary | ICD-10-CM | POA: Diagnosis not present

## 2015-10-09 DIAGNOSIS — M545 Low back pain: Secondary | ICD-10-CM | POA: Diagnosis not present

## 2015-10-09 DIAGNOSIS — M25552 Pain in left hip: Secondary | ICD-10-CM | POA: Diagnosis not present

## 2015-10-17 ENCOUNTER — Other Ambulatory Visit: Payer: Self-pay | Admitting: Orthopedic Surgery

## 2015-10-17 DIAGNOSIS — M5416 Radiculopathy, lumbar region: Secondary | ICD-10-CM

## 2015-10-17 DIAGNOSIS — M25552 Pain in left hip: Secondary | ICD-10-CM

## 2015-10-30 ENCOUNTER — Ambulatory Visit
Admission: RE | Admit: 2015-10-30 | Discharge: 2015-10-30 | Disposition: A | Discharge status: Home / Self Care | Payer: Medicare Other | Source: Ambulatory Visit | Attending: Orthopedic Surgery | Admitting: Orthopedic Surgery

## 2015-10-30 DIAGNOSIS — M25552 Pain in left hip: Secondary | ICD-10-CM

## 2015-10-30 DIAGNOSIS — M5416 Radiculopathy, lumbar region: Secondary | ICD-10-CM

## 2015-10-30 DIAGNOSIS — M48061 Spinal stenosis, lumbar region without neurogenic claudication: Secondary | ICD-10-CM | POA: Diagnosis not present

## 2015-10-31 ENCOUNTER — Other Ambulatory Visit: Payer: Self-pay | Admitting: Internal Medicine

## 2015-11-05 ENCOUNTER — Encounter: Payer: Self-pay | Admitting: Physician Assistant

## 2015-11-05 ENCOUNTER — Ambulatory Visit (INDEPENDENT_AMBULATORY_CARE_PROVIDER_SITE_OTHER): Payer: Medicare Other | Admitting: Physician Assistant

## 2015-11-05 ENCOUNTER — Other Ambulatory Visit: Payer: Self-pay | Admitting: Internal Medicine

## 2015-11-05 VITALS — BP 132/88 | HR 88 | Temp 97.7°F | Resp 14 | Ht 70.5 in | Wt 209.0 lb

## 2015-11-05 DIAGNOSIS — R1013 Epigastric pain: Secondary | ICD-10-CM

## 2015-11-05 DIAGNOSIS — M5442 Lumbago with sciatica, left side: Secondary | ICD-10-CM | POA: Diagnosis not present

## 2015-11-05 DIAGNOSIS — M545 Low back pain: Secondary | ICD-10-CM | POA: Diagnosis not present

## 2015-11-05 DIAGNOSIS — M1612 Unilateral primary osteoarthritis, left hip: Secondary | ICD-10-CM | POA: Diagnosis not present

## 2015-11-05 DIAGNOSIS — I2510 Atherosclerotic heart disease of native coronary artery without angina pectoris: Secondary | ICD-10-CM | POA: Diagnosis not present

## 2015-11-05 MED ORDER — ONDANSETRON HCL 8 MG PO TABS: ORAL_TABLET | ORAL | 1 refills | Status: DC

## 2015-11-05 NOTE — Patient Instructions (Signed)
Stop gabapentin, do lyrica 75mg  2 at night and 1 in the morning, can do 2 in AM and 2 in PM  Stop mobic for now  Do the PPI twice daily for 1 week then once daily for 1 week Then get zantac 300mg  once or twice a day  If any black stool let me know, if any worsening nausea/vomiting.   Food Choices for Gastroesophageal Reflux Disease, Adult When you have gastroesophageal reflux disease (GERD), the foods you eat and your eating habits are very important. Choosing the right foods can help ease the discomfort of GERD. WHAT GENERAL GUIDELINES DO I NEED TO FOLLOW?  Choose fruits, vegetables, whole grains, low-fat dairy products, and low-fat meat, fish, and poultry.  Limit fats such as oils, salad dressings, butter, nuts, and avocado.  Keep a food diary to identify foods that cause symptoms.  Avoid foods that cause reflux. These may be different for different people.  Eat frequent small meals instead of three large meals each day.  Eat your meals slowly, in a relaxed setting.  Limit fried foods.  Cook foods using methods other than frying.  Avoid drinking alcohol.  Avoid drinking large amounts of liquids with your meals.  Avoid bending over or lying down until 2-3 hours after eating. WHAT FOODS ARE NOT RECOMMENDED? The following are some foods and drinks that may worsen your symptoms: Vegetables Tomatoes. Tomato juice. Tomato and spaghetti sauce. Chili peppers. Onion and garlic. Horseradish. Fruits Oranges, grapefruit, and lemon (fruit and juice). Meats High-fat meats, fish, and poultry. This includes hot dogs, ribs, ham, sausage, salami, and bacon. Dairy Whole milk and chocolate milk. Sour cream. Cream. Butter. Ice cream. Cream cheese.  Beverages Coffee and tea, with or without caffeine. Carbonated beverages or energy drinks. Condiments Hot sauce. Barbecue sauce.  Sweets/Desserts Chocolate and cocoa. Donuts. Peppermint and spearmint. Fats and Oils High-fat foods, including  [place] fries and potato chips. Other Vinegar. Strong spices, such as black pepper, white pepper, red pepper, cayenne, curry powder, cloves, ginger, and chili powder. The items listed above may not be a complete list of foods and beverages to avoid. Contact your dietitian for more information.   This information is not intended to replace advice given to you by your health care provider. Make sure you discuss any questions you have with your health care provider.   Document Released: 12/23/2004 Document Revised: 01/13/2014 Document Reviewed: 10/27/2012 Elsevier Interactive Patient Education [employer].

## 2015-11-05 NOTE — Progress Notes (Signed)
Subjective:    Patient ID: Bryan [name], male    DOB: 08-07-1950, 65 y.o.   MRN: [id number]  HPI 65 y.o. WM presents with nausea and vomiting.  He had AB bloating, decreased appetite over the weekend then Saturday night he had nausea, and bilious vomiting with acid in his mouth and right upper quadrant pain. Worse with food, no particular type. He has been having back and hip pain that he is taking mobic daily with motrin and is on plavix. Had AB [place] 06/2015 normal gallbladder. Denies black stools, blood in stool. Has been taking tums last week but no PPI/H2.   Blood pressure 132/88, pulse 88, temperature 97.7 F (36.5 C), resp. rate 14, height 5' 10.5" (1.791 m), weight 209 lb (94.8 kg), SpO2 98 %.  Medications Current Outpatient Prescriptions on File Prior to Visit  Medication Sig  . BYSTOLIC 10 MG tablet take 1 tablet by mouth once daily  . clopidogrel (PLAVIX) 75 MG tablet take 1 tablet by mouth once daily  . cyclobenzaprine (FLEXERIL) 10 MG tablet take 1 tablet by mouth three times a day if needed for muscle spasm  . Doxepin HCl 6 MG TABS Take 1 tablet (6 mg total) by mouth at bedtime.  Marland Kitchen ezetimibe (ZETIA) 10 MG tablet take 1 tablet by mouth once daily  . gabapentin (NEURONTIN) 300 MG capsule take 1 capsule by mouth three times a day  . lisinopril (PRINIVIL,ZESTRIL) 20 MG tablet take 1 tablet by mouth twice a day  . meloxicam (MOBIC) 15 MG tablet Take 1 tablet (15 mg total) by mouth daily.  . rosuvastatin (CRESTOR) 10 MG tablet Take 1 tablet (10 mg total) by mouth daily.  . sertraline (ZOLOFT) 50 MG tablet take 1 tablet by mouth once daily   No current facility-administered medications on file prior to visit.     Problem list He has Hyperlipidemia; Hypertension; Vitamin D deficiency; Prediabetes; Medication management; CAD (coronary artery disease), native coronary artery; History of PSVT; Symptomatic PVCs; CKD (chronic kidney disease) stage 2, GFR 60-89 ml/min; BMI 30.85,adult;  Morbid obesity (BMI 30.85)    ([place]); and Insomnia on his problem list.  Review of Systems  Constitutional: Negative for chills, fatigue and fever.  HENT: Negative.   Respiratory: Negative.   Cardiovascular: Negative.   Gastrointestinal: Positive for abdominal pain, diarrhea, nausea and vomiting. Negative for abdominal distention, anal bleeding, blood in stool, constipation and rectal pain.  Genitourinary: Negative.   Musculoskeletal: Negative.   Neurological: Negative.        Objective:   Physical Exam  Constitutional: He is oriented to person, place, and time. He appears well-developed and well-nourished.  HENT:  Head: Normocephalic and atraumatic.  Right Ear: External ear normal.  Left Ear: External ear normal.  Mouth/Throat: Oropharynx is clear and moist.  Eyes: Conjunctivae and EOM are normal. Pupils are equal, round, and reactive to light.  Neck: Normal range of motion. Neck supple.  Cardiovascular: Normal rate, regular rhythm and normal heart sounds.   Pulmonary/Chest: Effort normal and breath sounds normal.  Abdominal: Soft. Bowel sounds are normal. He exhibits no mass. There is tenderness in the right upper quadrant and epigastric area. There is no rigidity, no rebound, no guarding, no tenderness at McBurney's point and negative Murphy's sign.  Musculoskeletal: Normal range of motion.  Neurological: He is alert and oriented to person, place, and time. No cranial nerve deficit.  Skin: Skin is warm and dry.       Assessment & Plan:  Nausea/vomiting with RUQ/epigastric pain likely NSAID induced Recent normal AB [place] PPI x 2 weeks, stop NSAIDS, if not better may need HIDA Any black stool, worsening AB pain, N/V call the office.  Will switch gabapentin to lyrica for back pain and stop NSAIDS

## 2015-11-07 DIAGNOSIS — M25552 Pain in left hip: Secondary | ICD-10-CM | POA: Diagnosis not present

## 2015-11-07 DIAGNOSIS — M5416 Radiculopathy, lumbar region: Secondary | ICD-10-CM | POA: Diagnosis not present

## 2015-11-11 ENCOUNTER — Other Ambulatory Visit: Payer: Self-pay | Admitting: Internal Medicine

## 2015-11-13 DIAGNOSIS — M25552 Pain in left hip: Secondary | ICD-10-CM | POA: Diagnosis not present

## 2015-11-20 ENCOUNTER — Other Ambulatory Visit: Payer: Self-pay | Admitting: Internal Medicine

## 2015-11-20 ENCOUNTER — Ambulatory Visit (INDEPENDENT_AMBULATORY_CARE_PROVIDER_SITE_OTHER): Payer: Medicare Other | Admitting: Internal Medicine

## 2015-11-20 ENCOUNTER — Encounter: Payer: Self-pay | Admitting: Internal Medicine

## 2015-11-20 VITALS — BP 128/74 | HR 67 | Temp 97.5°F | Resp 16 | Ht 69.5 in | Wt 209.2 lb

## 2015-11-20 DIAGNOSIS — Z125 Encounter for screening for malignant neoplasm of prostate: Secondary | ICD-10-CM | POA: Diagnosis not present

## 2015-11-20 DIAGNOSIS — N320 Bladder-neck obstruction: Secondary | ICD-10-CM

## 2015-11-20 DIAGNOSIS — I10 Essential (primary) hypertension: Secondary | ICD-10-CM | POA: Diagnosis not present

## 2015-11-20 DIAGNOSIS — Z79899 Other long term (current) drug therapy: Secondary | ICD-10-CM | POA: Diagnosis not present

## 2015-11-20 DIAGNOSIS — F5101 Primary insomnia: Secondary | ICD-10-CM

## 2015-11-20 DIAGNOSIS — I152 Hypertension secondary to endocrine disorders: Secondary | ICD-10-CM

## 2015-11-20 DIAGNOSIS — E291 Testicular hypofunction: Secondary | ICD-10-CM

## 2015-11-20 DIAGNOSIS — I2510 Atherosclerotic heart disease of native coronary artery without angina pectoris: Secondary | ICD-10-CM | POA: Diagnosis not present

## 2015-11-20 DIAGNOSIS — Z1212 Encounter for screening for malignant neoplasm of rectum: Secondary | ICD-10-CM

## 2015-11-20 DIAGNOSIS — Z136 Encounter for screening for cardiovascular disorders: Secondary | ICD-10-CM | POA: Diagnosis not present

## 2015-11-20 DIAGNOSIS — G4710 Hypersomnia, unspecified: Secondary | ICD-10-CM

## 2015-11-20 DIAGNOSIS — N182 Chronic kidney disease, stage 2 (mild): Secondary | ICD-10-CM

## 2015-11-20 DIAGNOSIS — R5383 Other fatigue: Secondary | ICD-10-CM | POA: Diagnosis not present

## 2015-11-20 DIAGNOSIS — E782 Mixed hyperlipidemia: Secondary | ICD-10-CM | POA: Diagnosis not present

## 2015-11-20 DIAGNOSIS — R7303 Prediabetes: Secondary | ICD-10-CM

## 2015-11-20 DIAGNOSIS — R7309 Other abnormal glucose: Secondary | ICD-10-CM | POA: Diagnosis not present

## 2015-11-20 DIAGNOSIS — E559 Vitamin D deficiency, unspecified: Secondary | ICD-10-CM

## 2015-11-20 DIAGNOSIS — G2581 Restless legs syndrome: Secondary | ICD-10-CM | POA: Diagnosis not present

## 2015-11-20 DIAGNOSIS — G4730 Sleep apnea, unspecified: Secondary | ICD-10-CM

## 2015-11-20 LAB — HEPATIC FUNCTION PANEL
ALBUMIN: 4.4 g/dL (ref 3.6–5.1)
ALT: 26 U/L (ref 9–46)
AST: 19 U/L (ref 10–35)
Alkaline Phosphatase: 65 U/L (ref 40–115)
BILIRUBIN TOTAL: 0.7 mg/dL (ref 0.2–1.2)
Bilirubin, Direct: 0.1 mg/dL (ref <=0.2)
Indirect Bilirubin: 0.6 mg/dL (ref 0.2–1.2)
Total Protein: 6.9 g/dL (ref 6.1–8.1)

## 2015-11-20 LAB — LIPID PANEL
Cholesterol: 181 mg/dL (ref <200)
HDL: 46 mg/dL (ref >40)
TRIGLYCERIDES: 471 mg/dL — AB (ref <150)
Total CHOL/HDL Ratio: 3.9 Ratio (ref <5.0)

## 2015-11-20 LAB — BASIC METABOLIC PANEL WITH GFR
BUN: 16 mg/dL (ref 7–25)
CALCIUM: 9.7 mg/dL (ref 8.6–10.3)
CO2: 25 mmol/L (ref 20–31)
CREATININE: 1.36 mg/dL — AB (ref 0.70–1.25)
Chloride: 100 mmol/L (ref 98–110)
GFR, EST AFRICAN AMERICAN: 63 mL/min (ref >=60)
GFR, Est Non African American: 54 mL/min — ABNORMAL LOW (ref >=60)
Glucose, Bld: 83 mg/dL (ref 65–99)
Potassium: 4.3 mmol/L (ref 3.5–5.3)
Sodium: 137 mmol/L (ref 135–146)

## 2015-11-20 LAB — CBC WITH DIFFERENTIAL/PLATELET
BASOS ABS: 0 {cells}/uL (ref 0–200)
BASOS PCT: 0 %
EOS ABS: 372 {cells}/uL (ref 15–500)
Eosinophils Relative: 4 %
HCT: 51.4 % — ABNORMAL HIGH (ref 38.5–50.0)
Hemoglobin: 17.5 g/dL — ABNORMAL HIGH (ref 13.2–17.1)
LYMPHS PCT: 25 %
Lymphs Abs: 2325 cells/uL (ref 850–3900)
MCH: 30 pg (ref 27.0–33.0)
MCHC: 34 g/dL (ref 32.0–36.0)
MCV: 88.2 fL (ref 80.0–100.0)
MONOS PCT: 9 %
MPV: 10 fL (ref 7.5–12.5)
Monocytes Absolute: 837 cells/uL (ref 200–950)
Neutro Abs: 5766 cells/uL (ref 1500–7800)
Neutrophils Relative %: 62 %
PLATELETS: 226 10*3/uL (ref 140–400)
RBC: 5.83 MIL/uL — ABNORMAL HIGH (ref 4.20–5.80)
RDW: 12.9 % (ref 11.0–15.0)
WBC: 9.3 10*3/uL (ref 3.8–10.8)

## 2015-11-20 LAB — TSH: TSH: 1.65 m[IU]/L (ref 0.40–4.50)

## 2015-11-20 LAB — MAGNESIUM: MAGNESIUM: 2 mg/dL (ref 1.5–2.5)

## 2015-11-20 NOTE — Progress Notes (Signed)
[facility]   [name] Pinto, M.D.    [name], [name]      Starlyn Skeans, [name]  [facility]                [address] [place] [id number] Telephone [phone] Telefax [phone]  Evaluation & Examination     This very nice 65 y.o. [name] presents for a comprehensive evaluation and management of multiple medical co-morbidities.  Patient has been followed for HTN, ASCAD, Prediabetes, Hyperlipidemia and Vitamin D Deficiency.     HTN predates circa 1990. Patient's BP has been controlled at home.Today's BP is 128/74. In 2003 he presented with an acute anterior  NSTEMI and underwent PCA/Stenting. In 2004 he underwent RF Ablation for pAfib. In 2010, he had a negative Myoview.  Patient denies any cardiac symptoms as chest pain, palpitations, shortness of breath, dizziness or ankle swelling.     Patient has longstanding hx/o poor sleep hygiene with associated RLS, snoring and question of apneic episodes and lastly daytime hypersomnolence. He has not followed thru in the past for recommended sleep studies and evaluations. He has utilized various and sundry sophoretic agents over the years some times combining various therapies out  of frustration and desperation for restful sleep.      Patient's hyperlipidemia is controlled with diet and medications. Patient denies myalgias or other medication SE's. Last lipids were at goal:  Lab Results  Component Value Date   CHOL 155 05/07/2015   HDL 63 05/07/2015   LDLCALC 59 05/07/2015   TRIG 164 (H) 05/07/2015   CHOLHDL 2.5 05/07/2015      Patient has Morbid Obesity (BMI 30+) and is proactively screened for prediabetes  and patient denies reactive hypoglycemic symptoms, visual blurring, diabetic polys or paresthesias. Last A1c was at goal:  Lab Results  Component Value Date   HGBA1C 5.3 05/07/2015       Finally, patient has history of Vitamin D  Deficiency in 2008  of "26" and last vitamin D was at goal: Lab Results  Component Value Date   VD25OH 76 05/07/2015   Current Outpatient Prescriptions on File Prior to Visit  Medication Sig  . BYSTOLIC 10 MG tablet take 1 tablet by mouth once daily  . clopidogrel (PLAVIX) 75 MG tablet take 1 tablet by mouth once daily  . cyclobenzaprine (FLEXERIL) 10 MG tablet take 1 tablet by mouth three times a day if needed for muscle spasm  . ezetimibe (ZETIA) 10 MG tablet take 1 tablet by mouth once daily  . gabapentin (NEURONTIN) 300 MG capsule take 1 capsule by mouth three times a day  . lisinopril (PRINIVIL,ZESTRIL) 20 MG tablet take 1 tablet by mouth twice a day  . ondansetron (ZOFRAN) 8 MG tablet 1/2-1 pill as needed every 8 hours for nausea  . rosuvastatin (CRESTOR) 10 MG tablet Take 1 tablet (10 mg total) by mouth daily.  . sertraline (ZOLOFT) 50 MG tablet take 1 tablet by mouth once daily  . SILENOR 6 MG TABS take 1 tablet by mouth at bedtime   No current facility-administered medications on file prior to visit.    Allergies  Allergen Reactions  . Amoxicillin Hives and Swelling  . Lunesta [Eszopiclone]     Restless legs  . Penicillins Swelling   Past Medical History:  Diagnosis Date  . CAD (coronary artery disease), native coronary  artery    Cath 04/19/2001  normal Left main, moderate proximal disease, 80% stenosis distal LAD, 99% stenosis proximal OM 1, 60% stenosis mid RCA, 70% stenosis ostial mid PDA  PCI with bare metal stent of OM1 same date 3.5 x 18 mm Multilink    . Diverticulosis   . GERD (gastroesophageal reflux disease)   . Hernia   . History of PSVT    [facility]bl[facility] done in 2004 at [facility]   . Hyperlipidemia   . Hypertension   . Symptomatic PVCs   . Vitamin D deficiency    Health Maintenance  Topic Date Due  . Hepatitis C Screening  01-09-1950  . HIV Screening  05/[birth year]/1965  . INFLUENZA VACCINE  11/21/2016 (Originally 08/07/2015)  . TETANUS/TDAP  01/07/2016  .  PNA vac Low Risk Adult (2 of 2 - PPSV23) 10/23/2019  . COLONOSCOPY  12/24/2021  . ZOSTAVAX  Completed   Immunization History  Administered Date(s) Administered  . Influenza Split 10/13/2013, 10/23/2014  . Influenza,inj,quad, With Preservative 01/13/2013  . Influenza-Unspecified 10/07/2011  . PPD Test 10/13/2013, 10/23/2014  . Pneumococcal Conjugate-13 10/13/2013  . Pneumococcal Polysaccharide-23 10/23/2014  . Td 01/06/2006  . Zoster 09/22/2011   Past Surgical History:  Procedure Laterality Date  . ACHILLES TENDON REPAIR     left  . APPENDECTOMY    . CARDIAC ELECTROPHYSIOLOGY MAPPING AND ABLATION     Family History  Problem Relation Age of Onset  . Colon cancer Mother     malignant polyp of rectosigmoid colon  . Hypothyroidism Mother   . Irritable bowel syndrome Mother   . Cancer Father     brain   Social History   Social History  . Marital status: Married    Spouse name: N/A  . Number of children: 3   . Years of education: College   Occupational History  . Sales   Social History Main Topics  . Smoking status: Never Smoker  . Smokeless tobacco: Never Used  . Alcohol use No     Comment: rarely  . Drug use: No  . Sexual activity: Active    ROS Constitutional: Denies fever, chills, weight loss/gain, headaches, insomnia,  night sweats or change in appetite. Does c/o fatigue. Eyes: Denies redness, blurred vision, diplopia, discharge, itchy or watery eyes.  ENT: Denies discharge, congestion, post nasal drip, epistaxis, sore throat, earache, hearing loss, dental pain, Tinnitus, Vertigo, Sinus pain or snoring.  Cardio: Denies chest pain, palpitations, irregular heartbeat, syncope, dyspnea, diaphoresis, orthopnea, PND, claudication or edema Respiratory: denies cough, dyspnea, DOE, pleurisy, hoarseness, laryngitis or wheezing.  Gastrointestinal: Denies dysphagia, heartburn, reflux, water brash, pain, cramps, nausea, vomiting, bloating, diarrhea, constipation, hematemesis,  melena, hematochezia, jaundice or hemorrhoids Genitourinary: Denies dysuria, frequency, urgency, nocturia, hesitancy, discharge, hematuria or flank pain Musculoskeletal: Denies arthralgia, myalgia, stiffness, Jt. Swelling, pain, limp or strain/sprain. Denies Falls. Skin: Denies puritis, rash, hives, warts, acne, eczema or change in skin lesion Neuro: No weakness, tremor, incoordination, spasms, paresthesia or pain Psychiatric: Denies confusion, memory loss or sensory loss. Denies Depression. Endocrine: Denies change in weight, skin, hair change, nocturia, and paresthesia, diabetic polys, visual blurring or hyper / hypo glycemic episodes.  Heme/Lymph: No excessive bleeding, bruising or enlarged lymph nodes.  Physical Exam  BP 128/74   Pulse 67   Temp 97.5 F (36.4 C)   Resp 16   Ht 5' 9.5" (1.765 m)   Wt 209 lb 3.2 oz (94.9 kg)   SpO2 98%   BMI 30.45 kg/m   General  Appearance: Well nourished, in no apparent distress.  Eyes: PERRLA, EOMs, conjunctiva no swelling or erythema, normal fundi and vessels. Sinuses: No frontal/maxillary tenderness ENT/Mouth: EACs patent / TMs  nl. Nares clear without erythema, swelling, mucoid exudates. Oral hygiene is good. No erythema, swelling, or exudate. Tongue normal, non-obstructing. Tonsils not swollen or erythematous. Hearing normal.  Neck: Supple, thyroid normal. No bruits, nodes or JVD. Respiratory: Respiratory effort normal.  BS equal and clear bilateral without rales, rhonci, wheezing or stridor. Cardio: Heart sounds are normal with regular rate and rhythm and no murmurs, rubs or gallops. Peripheral pulses are normal and equal bilaterally without edema. No aortic or femoral bruits. Chest: symmetric with normal excursions and percussion.  Abdomen: Soft, with Nl bowel sounds. Nontender, no guarding, rebound, hernias, masses, or organomegaly.  Lymphatics: Non tender without lymphadenopathy.  Genitourinary: No hernias.Testes nl. DRE - prostate nl for  age - smooth & firm w/o nodules. Musculoskeletal: Full ROM all peripheral extremities, joint stability, 5/5 strength, and normal gait. Skin: Warm and dry without rashes, lesions, cyanosis, clubbing or  ecchymosis.  Neuro: Cranial nerves intact, reflexes equal bilaterally. Normal muscle tone, no cerebellar symptoms. Sensation intact.  Pysch: Alert and oriented X 3 with normal affect, insight and judgment appropriate.   Assessment and Plan  1. Essential hypertension  - Microalbumin / creatinine urine ratio - EKG 12-Lead - [place], RETROPERITNL ABD,  LTD - TSH  2. Mixed hyperlipidemia  - EKG 12-Lead - [place], RETROPERITNL ABD,  LTD - Lipid panel - TSH  3. Prediabetes  - EKG 12-Lead - [place], RETROPERITNL ABD,  LTD - Hemoglobin A1c - Insulin, random  4. Vitamin D deficiency  - VITAMIN D 25 Hydroxy   5. Coronary artery disease involving native coronary artery of native heart without angina pectoris  - EKG 12-Lead - [place], RETROPERITNL ABD,  LTD - Lipid panel  6. Primary insomnia   7. Hypersomnia with sleep apnea   8. RLS (restless legs syndrome)   9. CKD (chronic kidney disease) stage 2, GFR 60-89 ml/min   10. Hypogonadism in male  - Testosterone  11. Screening for rectal cancer  - POC Hemoccult Bld/Stl  12. Prostate cancer screening  - PSA  13. Screening for ischemic heart disease  - EKG 12-Lead  14. Screening for AAA (aortic abdominal aneurysm)  - [place], RETROPERITNL ABD,  LTD  15. Other fatigue  - Testosterone - CBC with Differential/Platelet - TSH  16. Other abnormal glucose  - Hemoglobin A1c - Insulin, random  17. Bladder neck obstruction  - PSA  18. Medication management  - Urinalysis, Routine w reflex microscopic - CBC with Differential/Platelet - BASIC METABOLIC PANEL WITH GFR - Hepatic function panel - Magnesium       Continue prudent diet as discussed, weight control, BP monitoring, regular exercise, and medications as discussed.   Discussed med effects and SE's. Routine screening labs and tests as requested with regular follow-up as recommended. Over 40 minutes of exam, counseling, chart review and high complex critical decision making was performed

## 2015-11-20 NOTE — Patient Instructions (Addendum)
Sleep Studies A sleep study (polysomnogram) is a series of tests done while you are sleeping. It can show how well you sleep. This can help your health care provider diagnose a sleep disorder and show how severe your sleep disorder is. A sleep study may lead to treatment that will help you sleep better and prevent other medical problems caused by poor sleep. If you have a sleep disorder, you may also be at risk for:  Sleep-related accidents.  High blood pressure.  Heart disease.  Stroke.  Other medical conditions. Sleep disorders are common. Your health care provider may suspect a sleep disorder if you:  Have loud snoring most nights.  Have brief periods when you stop breathing at night.  Feel sleepy on most days.  Fall asleep suddenly during the day.  Have trouble falling asleep or staying asleep.  Feel like you need to move your legs when trying to fall asleep.  Have dreams that seem very real shortly after falling asleep.  Feel like you cannot move when you first wake up. Which tests will I need to have? Most sleep studies last all night and include these tests:  Recordings of your brain activity.  Recordings of your eye movements.  Recording of your heart rate and rhythm.  Blood pressure readings.  Readings of the amount of oxygen in your blood.  Measurements of your chest and belly movement as you breathe during sleep. If you have signs of the sleep disorder called sleep apnea during your test, you may get a mask to wear for the second half of the night.  The mask provides continuous positive airway pressure (CPAP). This may improve sleep apnea significantly.  You will then have all tests done again with the mask in place to see if your measurements and recordings change. How are sleep studies done? Most sleep studies are done over one full night of sleep.  You will arrive at the study center in the evening and can go home in the morning.  Bring your pajamas  and toothbrush.  Do not have caffeine on the day of your sleep study.  Your health care provider will let you know if you need to stop taking any of your regular medicines before the test. To do the tests included in a polysomnogram, you will have:  Round, sticky patches with sensors attached to recording wires (electrodes) placed on your scalp, face, chest, and limbs.  Wires from all the electrodes and sensors run from your bed to a computer. The wires can be taken off and put back on if you need to get out of bed to go to the bathroom.  A sensor placed over your nose to measure airflow.  A finger clip put on one finger to measure your blood oxygen level.  A belt around your belly and a belt around your chest to measure breathing movements. Where are sleep studies done? Sleep studies are done at sleep centers. A sleep center may be inside a hospital, office, or clinic. The room where you have the study may look like a hospital room or a hotel room. The health care providers doing the study may come in and out of the room during the study. Most of the time, they will be in another room monitoring your test. How is information from sleep studies helpful? A polysomnogram can be used along with your medical history and a physical exam to diagnose conditions, such as:  Sleep apnea.  Restless legs syndrome.  Sleep-related   seizure disorders.  Sleep-related movement disorders. A medical doctor who specializes in sleep will evaluate your sleep study. The specialist will share the results with your primary health care provider. Treatments based on your sleep study may include:  Improving your sleep habits (sleep hygiene).  Wearing a CPAP mask.  Wearing an oral device at night to improve breathing and reduce snoring.  Taking medicine for:  Restless legs syndrome.  Sleep-related seizure disorder.  Sleep-related movement disorder. This information is not intended to replace advice  given to you by your health care provider. Make sure you discuss any questions you have with your health care provider. Document Released: 06/29/2002 Document Revised: 08/19/2015 Document Reviewed: 02/28/2013 Elsevier Interactive Patient Education  2017 [employer]  ++++++++++++++++++++++++++++++++++ Sleep Apnea Sleep apnea is a condition in which breathing pauses or becomes shallow during sleep. Episodes of sleep apnea usually last 10 seconds or longer, and they may occur as many as 20 times an hour. Sleep apnea disrupts your sleep and keeps your body from getting the rest that it needs. This condition can increase your risk of certain health problems, including:  Heart attack.  Stroke.  Obesity.  Diabetes.  Heart failure.  Irregular heartbeat. There are three kinds of sleep apnea:  Obstructive sleep apnea. This kind is caused by a blocked or collapsed airway.  Central sleep apnea. This kind happens when the part of the brain that controls breathing does not send the correct signals to the muscles that control breathing.  Mixed sleep apnea. This is a combination of obstructive and central sleep apnea. What are the causes? The most common cause of this condition is a collapsed or blocked airway. An airway can collapse or become blocked if:  Your throat muscles are abnormally relaxed.  Your tongue and tonsils are larger than normal.  You are overweight.  Your airway is smaller than normal. What increases the risk? This condition is more likely to develop in people who:  Are overweight.  Smoke.  Have a smaller than normal airway.  Are elderly.  Are male.  Drink alcohol.  Take sedatives or tranquilizers.  Have a family history of sleep apnea. What are the signs or symptoms? Symptoms of this condition include:  Trouble staying asleep.  Daytime sleepiness and tiredness.  Irritability.  Loud snoring.  Morning headaches.  Trouble  concentrating.  Forgetfulness.  Decreased interest in sex.  Unexplained sleepiness.  Mood swings.  Personality changes.  Feelings of depression.  Waking up often during the night to urinate.  Dry mouth.  Sore throat. How is this diagnosed? This condition may be diagnosed with:  A medical history.  A physical exam.  A series of tests that are done while you are sleeping (sleep study). These tests are usually done in a sleep lab, but they may also be done at home. How is this treated? Treatment for this condition aims to restore normal breathing and to ease symptoms during sleep. It may involve managing health issues that can affect breathing, such as high blood pressure or obesity. Treatment may include:  Sleeping on your side.  Using a decongestant if you have nasal congestion.  Avoiding the use of depressants, including alcohol, sedatives, and narcotics.  Losing weight if you are overweight.  Making changes to your diet.  Quitting smoking.  Using a device to open your airway while you sleep, such as:  An oral appliance. This is a custom-made mouthpiece that shifts your lower jaw forward.  A continuous positive airway pressure (  CPAP) device. This device delivers oxygen to your airway through a mask.  A nasal expiratory positive airway pressure (EPAP) device. This device has valves that you put into each nostril.  A bi-level positive airway pressure (BPAP) device. This device delivers oxygen to your airway through a mask.  Surgery if other treatments do not work. During surgery, excess tissue is removed to create a wider airway. It is important to get treatment for sleep apnea. Without treatment, this condition can lead to:  High blood pressure.  Coronary artery disease.  (Men) An inability to achieve or maintain an erection (impotence).  Reduced thinking abilities. Follow these instructions at home:  Make any lifestyle changes that your health care  provider recommends.  Eat a healthy, well-balanced diet.  Take over-the-counter and prescription medicines only as told by your health care provider.  Avoid using depressants, including alcohol, sedatives, and narcotics.  Take steps to lose weight if you are overweight.  If you were given a device to open your airway while you sleep, use it only as told by your health care provider.  Do not use any tobacco products, such as cigarettes, chewing tobacco, and e-cigarettes. If you need help quitting, ask your health care provider.  Keep all follow-up visits as told by your health care provider. This is important. Contact a health care provider if:  The device that you received to open your airway during sleep is uncomfortable or does not seem to be working.  Your symptoms do not improve.  Your symptoms get worse. Get help right away if:  You develop chest pain.  You develop shortness of breath.  You develop discomfort in your back, arms, or stomach.  You have trouble speaking.  You have weakness on one side of your body.  You have drooping in your face. These symptoms may represent a serious problem that is an emergency. Do not wait to see if the symptoms will go away. Get medical help right away. Call your local emergency services (911 in the [place] Do not drive yourself to the hospital.  This information is not intended to replace advice given to you by your health care provider. Make sure you discuss any questions you have with your health care provider. Document Released: 12/13/2001 Document Revised: 08/19/2015 Document Reviewed: 10/02/2014 Elsevier Interactive Patient Education  2017 [employer].  +++++++++++++++++++++++++++++++++ Preventive Care for Adults  A healthy lifestyle and preventive care can promote health and wellness. Preventive health guidelines for men include the following key practices:  A routine yearly physical is a good way to check with your health  care provider about your health and preventative screening. It is a chance to share any concerns and updates on your health and to receive a thorough exam.  Visit your dentist for a routine exam and preventative care every 6 months. Brush your teeth twice a day and floss once a day. Good oral hygiene prevents tooth decay and gum disease.  The frequency of eye exams is based on your age, health, family medical history, use of contact lenses, and other factors. Follow your health care provider's recommendations for frequency of eye exams.  Eat a healthy diet. Foods such as vegetables, fruits, whole grains, low-fat dairy products, and lean protein foods contain the nutrients you need without too many calories. Decrease your intake of foods high in solid fats, added sugars, and salt. Eat the right amount of calories for you.Get information about a proper diet from your health care provider, if necessary.  Regular physical exercise is one of the most important things you can do for your health. Most adults should get at least 150 minutes of moderate-intensity exercise (any activity that increases your heart rate and causes you to sweat) each week. In addition, most adults need muscle-strengthening exercises on 2 or more days a week.  Maintain a healthy weight. The body mass index (BMI) is a screening tool to identify possible weight problems. It provides an estimate of body fat based on height and weight. Your health care provider can find your BMI and can help you achieve or maintain a healthy weight.For adults 20 years and older:  A BMI below 18.5 is considered underweight.  A BMI of 18.5 to 24.9 is normal.  A BMI of 25 to 29.9 is considered overweight.  A BMI of 30 and above is considered obese.  Maintain normal blood lipids and cholesterol levels by exercising and minimizing your intake of saturated fat. Eat a balanced diet with plenty of fruit and vegetables. Blood tests for lipids and  cholesterol should begin at age 36 and be repeated every 5 years. If your lipid or cholesterol levels are high, you are over 50, or you are at high risk for heart disease, you may need your cholesterol levels checked more frequently.Ongoing high lipid and cholesterol levels should be treated with medicines if diet and exercise are not working.  If you smoke, find out from your health care provider how to quit. If you do not use tobacco, do not start.  Lung cancer screening is recommended for adults aged 86-80 years who are at high risk for developing lung cancer because of a history of smoking. A yearly low-dose CT scan of the lungs is recommended for people who have at least a 30-pack-year history of smoking and are a current smoker or have quit within the past 15 years. A pack year of smoking is smoking an average of 1 pack of cigarettes a day for 1 year (for example: 1 pack a day for 30 years or 2 packs a day for 15 years). Yearly screening should continue until the smoker has stopped smoking for at least 15 years. Yearly screening should be stopped for people who develop a health problem that would prevent them from having lung cancer treatment.  If you choose to drink alcohol, do not have more than 2 drinks per day. One drink is considered to be 12 ounces (355 mL) of beer, 5 ounces (148 mL) of wine, or 1.5 ounces (44 mL) of liquor.  Avoid use of street drugs. Do not share needles with anyone. Ask for help if you need support or instructions about stopping the use of drugs.  High blood pressure causes heart disease and increases the risk of stroke. Your blood pressure should be checked at least every 1-2 years. Ongoing high blood pressure should be treated with medicines, if weight loss and exercise are not effective.  If you are 45-69 years old, ask your health care provider if you should take aspirin to prevent heart disease.  Diabetes screening involves taking a blood sample to check your  fasting blood sugar level. Testing should be considered at a younger age or be carried out more frequently if you are overweight and have at least 1 risk factor for diabetes.  Colorectal cancer can be detected and often prevented. Most routine colorectal cancer screening begins at the age of 105 and continues through age 36. However, your health care provider may recommend screening at  an earlier age if you have risk factors for colon cancer. On a yearly basis, your health care provider may provide home test kits to check for hidden blood in the stool. Use of a small camera at the end of a tube to directly examine the colon (sigmoidoscopy or colonoscopy) can detect the earliest forms of colorectal cancer. Talk to your health care provider about this at age 22, when routine screening begins. Direct exam of the colon should be repeated every 5-10 years through age 33, unless early forms of precancerous polyps or small growths are found.  Hepatitis C blood testing is recommended for all people born from [birth year] through 1965 and any individual with known risks for hepatitis C.  Screening for abdominal aortic aneurysm (AAA)  by ultrasound is recommended for people who have history of high blood pressure or who are current or former smokers.  Healthy men should  receive prostate-specific antigen (PSA) blood tests as part of routine cancer screening. Talk with your health care provider about prostate cancer screening.  Testicular cancer screening is  recommended for adult males. Screening includes self-exam, a health care provider exam, and other screening tests. Consult with your health care provider about any symptoms you have or any concerns you have about testicular cancer.  Use sunscreen. Apply sunscreen liberally and repeatedly throughout the day. You should seek shade when your shadow is shorter than you. Protect yourself by wearing long sleeves, pants, a wide-brimmed hat, and sunglasses year round, whenever  you are outdoors.  Once a month, do a whole-body skin exam, using a mirror to look at the skin on your back. Tell your health care provider about new moles, moles that have irregular borders, moles that are larger than a pencil eraser, or moles that have changed in shape or color.  Stay current with required vaccines (immunizations).  Influenza vaccine. All adults should be immunized every year.  Tetanus, diphtheria, and acellular pertussis (Td, Tdap) vaccine. An adult who has not previously received Tdap or who does not know his vaccine status should receive 1 dose of Tdap. This initial dose should be followed by tetanus and diphtheria toxoids (Td) booster doses every 10 years. Adults with an unknown or incomplete history of completing a 3-dose immunization series with Td-containing vaccines should begin or complete a primary immunization series including a Tdap dose. Adults should receive a Td booster every 10 years.  Zoster vaccine. One dose is recommended for adults aged 77 years or older unless certain conditions are present.    PREVNAR - Pneumococcal 13-valent conjugate (PCV13) vaccine. When indicated, a person who is uncertain of his immunization history and has no record of immunization should receive the PCV13 vaccine. An adult aged 41 years or older who has certain medical conditions and has not been previously immunized should receive 1 dose of PCV13 vaccine. This PCV13 should be followed with a dose of pneumococcal polysaccharide (PPSV23) vaccine. The PPSV23 vaccine dose should be obtained at least 8 weeks after the dose of PCV13 vaccine. An adult aged 35 years or older who has certain medical conditions and previously received 1 or more doses of PPSV23 vaccine should receive 1 dose of PCV13. The PCV13 vaccine dose should be obtained 1 or more years after the last PPSV23 vaccine dose.    PNEUMOVAX - Pneumococcal polysaccharide (PPSV23) vaccine. When PCV13 is also indicated, PCV13 should  be obtained first. All adults aged 23 years and older should be immunized. An adult younger than age 63 years  who has certain medical conditions should be immunized. Any person who resides in a nursing home or long-term care facility should be immunized. An adult smoker should be immunized. People with an immunocompromised condition and certain other conditions should receive both PCV13 and PPSV23 vaccines. People with human immunodeficiency virus (HIV) infection should be immunized as soon as possible after diagnosis. Immunization during chemotherapy or radiation therapy should be avoided. Routine use of PPSV23 vaccine is not recommended for American Indians, [place] Natives, or people younger than 65 years unless there are medical conditions that require PPSV23 vaccine. When indicated, people who have unknown immunization and have no record of immunization should receive PPSV23 vaccine. One-time revaccination 5 years after the first dose of PPSV23 is recommended for people aged 19-64 years who have chronic kidney failure, nephrotic syndrome, asplenia, or immunocompromised conditions. People who received 1-2 doses of PPSV23 before age 32 years should receive another dose of PPSV23 vaccine at age 41 years or later if at least 5 years have passed since the previous dose. Doses of PPSV23 are not needed for people immunized with PPSV23 at or after age 9 years.    Hepatitis A vaccine. Adults who wish to be protected from this disease, have certain high-risk conditions, work with hepatitis A-infected animals, work in hepatitis A research labs, or travel to or work in countries with a high rate of hepatitis A should be immunized. Adults who were previously unvaccinated and who anticipate close contact with an international adoptee during the first 60 days after arrival in the [place] from a country with a high rate of hepatitis A should be immunized.    Hepatitis B vaccine. Adults should be immunized if they  wish to be protected from this disease, have certain high-risk conditions, may be exposed to blood or other infectious body fluids, are household contacts or sex partners of hepatitis B positive people, are clients or workers in certain care facilities, or travel to or work in countries with a high rate of hepatitis B.   Preventive Service / Frequency   Ages 62 and over  Blood pressure check.  Lipid and cholesterol check.  Lung cancer screening. / Every year if you are aged 3-80 years and have a 30-pack-year history of smoking and currently smoke or have quit within the past 15 years. Yearly screening is stopped once you have quit smoking for at least 15 years or develop a health problem that would prevent you from having lung cancer treatment.  Fecal occult blood test (FOBT) of stool. You may not have to do this test if you get a colonoscopy every 10 years.  Flexible sigmoidoscopy** or colonoscopy.** / Every 5 years for a flexible sigmoidoscopy or every 10 years for a colonoscopy beginning at age 85 and continuing until age 53.  Hepatitis C blood test.** / For all people born from [birth year] through 1965 and any individual with known risks for hepatitis C.  Abdominal aortic aneurysm (AAA) screening./ Screening current or former smokers or have Hypertension.  Skin self-exam. / Monthly.  Influenza vaccine. / Every year.  Tetanus, diphtheria, and acellular pertussis (Tdap/Td) vaccine.** / 1 dose of Td every 10 years.   Zoster vaccine.** / 1 dose for adults aged 43 years or older.         Pneumococcal 13-valent conjugate (PCV13) vaccine.    Pneumococcal polysaccharide (PPSV23) vaccine.     Hepatitis A vaccine.** / Consult your health care provider.  Hepatitis B vaccine.** / Consult your health care  provider. Screening for abdominal aortic aneurysm (AAA)  by ultrasound is recommended for people who have history of high blood pressure or who are current or former  smokers. ++++++++++ Recommend Adult Low Dose Aspirin or  coated  Aspirin 81 mg daily  To reduce risk of Colon Cancer 20 %,  Skin Cancer 26 % ,  Melanoma 46%  and  Pancreatic cancer 60% ++++++++++++++++++++++ Vitamin D goal  is between 70-100.  Please make sure that you are taking your Vitamin D as directed.  It is very important as a natural anti-inflammatory  helping hair, skin, and nails, as well as reducing stroke and heart attack risk.  It helps your bones and helps with mood. It also decreases numerous cancer risks so please take it as directed.  Low Vit D is associated with a 200-300% higher risk for CANCER  and 200-300% higher risk for HEART   ATTACK  &  STROKE.   .....................................Marland Kitchen It is also associated with higher death rate at younger ages,  autoimmune diseases like Rheumatoid arthritis, Lupus, Multiple Sclerosis.    Also many other serious conditions, like depression, Alzheimer's Dementia, infertility, muscle aches, fatigue, fibromyalgia - just to name a few. ++++++++++++++++++++++ Recommend the book "The END of DIETING" by Dr Excell Seltzer  & the book "The END of DIABETES " by Dr Excell Seltzer At [facility].[email or web address] - get book & Audio CD's    Being diabetic has a  300% increased risk for heart attack, stroke, cancer, and alzheimer- type vascular dementia. It is very important that you work harder with diet by avoiding all foods that are white. Avoid white rice (brown & wild rice is OK), white potatoes (sweetpotatoes in moderation is OK), White bread or wheat bread or anything made out of white flour like bagels, donuts, rolls, buns, biscuits, cakes, pastries, cookies, pizza crust, and pasta (made from white flour & egg whites) - vegetarian pasta or spinach or wheat pasta is OK. Multigrain breads like Arnold's or [employer], or multigrain sandwich thins or flatbreads.  Diet, exercise and weight loss can reverse and cure diabetes in the early stages.  Diet,  exercise and weight loss is very important in the control and prevention of complications of diabetes which affects every system in your body, ie. Brain - dementia/stroke, eyes - glaucoma/blindness, heart - heart attack/heart failure, kidneys - dialysis, stomach - gastric paralysis, intestines - malabsorption, nerves - severe painful neuritis, circulation - gangrene & loss of a leg(s), and finally cancer and Alzheimers.    I recommend avoid fried & greasy foods,  sweets/candy, white rice (brown or wild rice or Quinoa is OK), white potatoes (sweet potatoes are OK) - anything made from white flour - bagels, doughnuts, rolls, buns, biscuits,white and wheat breads, pizza crust and traditional pasta made of white flour & egg white(vegetarian pasta or spinach or wheat pasta is OK).  Multi-grain bread is OK - like multi-grain flat bread or sandwich thins. Avoid alcohol in excess. Exercise is also important.    Eat all the vegetables you want - avoid meat, especially red meat and dairy - especially cheese.  Cheese is the most concentrated form of trans-fats which is the worst thing to clog up our arteries. Veggie cheese is OK which can be found in the fresh produce section at [employer] or [employer] or [employer]  ++++++++++++++++++++++ DASH Eating Plan  DASH stands for "Dietary Approaches to Stop Hypertension."   The DASH eating plan is a healthy eating plan that has been  shown to reduce high blood pressure (hypertension). Additional health benefits may include reducing the risk of type 2 diabetes mellitus, heart disease, and stroke. The DASH eating plan may also help with weight loss. WHAT DO I NEED TO KNOW ABOUT THE DASH EATING PLAN? For the DASH eating plan, you will follow these general guidelines:  Choose foods with a percent daily value for sodium of less than 5% (as listed on the food label).  Use salt-free seasonings or herbs instead of table salt or sea salt.  Check with your health care  provider or pharmacist before using salt substitutes.  Eat lower-sodium products, often labeled as "lower sodium" or "no salt added."  Eat fresh foods.  Eat more vegetables, fruits, and low-fat dairy products.  Choose whole grains. Look for the word "whole" as the first word in the ingredient list.  Choose fish   Limit sweets, desserts, sugars, and sugary drinks.  Choose heart-healthy fats.  Eat veggie cheese   Eat more home-cooked food and less restaurant, buffet, and fast food.  Limit fried foods.  Cook foods using methods other than frying.  Limit canned vegetables. If you do use them, rinse them well to decrease the sodium.  When eating at a restaurant, ask that your food be prepared with less salt, or no salt if possible.                      WHAT FOODS CAN I EAT? Read Dr Fara Olden Fuhrman's books on The End of Dieting & The End of Diabetes  Grains Whole grain or whole wheat bread. Brown rice. Whole grain or whole wheat pasta. Quinoa, bulgur, and whole grain cereals. Low-sodium cereals. Corn or whole wheat flour tortillas. Whole grain cornbread. Whole grain crackers. Low-sodium crackers.  Vegetables Fresh or frozen vegetables (raw, steamed, roasted, or grilled). Low-sodium or reduced-sodium tomato and vegetable juices. Low-sodium or reduced-sodium tomato sauce and paste. Low-sodium or reduced-sodium canned vegetables.   Fruits All fresh, canned (in natural juice), or frozen fruits.  Protein Products  All fish and seafood.  Dried beans, peas, or lentils. Unsalted nuts and seeds. Unsalted canned beans.  Dairy Low-fat dairy products, such as skim or 1% milk, 2% or reduced-fat cheeses, low-fat ricotta or cottage cheese, or plain low-fat yogurt. Low-sodium or reduced-sodium cheeses.  Fats and Oils Tub margarines without trans fats. Light or reduced-fat mayonnaise and salad dressings (reduced sodium). Avocado. Safflower, olive, or canola oils. Natural peanut or almond  butter.  Other Unsalted popcorn and pretzels. The items listed above may not be a complete list of recommended foods or beverages. Contact your dietitian for more options.  ++++++++++++++++++++  WHAT FOODS ARE NOT RECOMMENDED? Grains/ White flour or wheat flour White bread. White pasta. White rice. Refined cornbread. Bagels and croissants. Crackers that contain trans fat.  Vegetables  Creamed or fried vegetables. Vegetables in a . Regular canned vegetables. Regular canned tomato sauce and paste. Regular tomato and vegetable juices.  Fruits Dried fruits. Canned fruit in light or heavy syrup. Fruit juice.  Meat and Other Protein Products Meat in general - RED meat & White meat.  Fatty cuts of meat. Ribs, chicken wings, all processed meats as bacon, sausage, bologna, salami, fatback, hot dogs, bratwurst and packaged luncheon meats.  Dairy Whole or 2% milk, cream, half-and-half, and cream cheese. Whole-fat or sweetened yogurt. Full-fat cheeses or blue cheese. Non-dairy creamers and whipped toppings. Processed cheese, cheese spreads, or cheese curds.  Condiments Onion and garlic salt, seasoned  salt, table salt, and sea salt. Canned and packaged gravies. Worcestershire sauce. Tartar sauce. Barbecue sauce. Teriyaki sauce. Soy sauce, including reduced sodium. Steak sauce. Fish sauce. Oyster sauce. Cocktail sauce. Horseradish. Ketchup and mustard. Meat flavorings and tenderizers. Bouillon cubes. Hot sauce. Tabasco sauce. Marinades. Taco seasonings. Relishes.  Fats and Oils Butter, stick margarine, lard, shortening and bacon fat. Coconut, palm kernel, or palm oils. Regular salad dressings.  Pickles and olives. Salted popcorn and pretzels.  The items listed above may not be a complete list of foods and beverages to avoid.

## 2015-11-21 ENCOUNTER — Encounter: Payer: Self-pay | Admitting: Internal Medicine

## 2015-11-21 LAB — URINALYSIS, ROUTINE W REFLEX MICROSCOPIC
BILIRUBIN URINE: NEGATIVE
GLUCOSE, UA: NEGATIVE
Hgb urine dipstick: NEGATIVE
Ketones, ur: NEGATIVE
LEUKOCYTES UA: NEGATIVE
Nitrite: NEGATIVE
PH: 6 (ref 5.0–8.0)
PROTEIN: NEGATIVE
SPECIFIC GRAVITY, URINE: 1.013 (ref 1.001–1.035)

## 2015-11-21 LAB — HEMOGLOBIN A1C
Hgb A1c MFr Bld: 5.2 % (ref <5.7)
MEAN PLASMA GLUCOSE: 103 mg/dL

## 2015-11-21 LAB — INSULIN, RANDOM: INSULIN: 45.8 u[IU]/mL — AB (ref 2.0–19.6)

## 2015-11-21 LAB — MICROALBUMIN / CREATININE URINE RATIO
Creatinine, Urine: 139 mg/dL (ref 20–370)
Microalb Creat Ratio: 6 mcg/mg creat (ref <30)
Microalb, Ur: 0.9 mg/dL

## 2015-11-21 LAB — VITAMIN D 25 HYDROXY (VIT D DEFICIENCY, FRACTURES): VIT D 25 HYDROXY: 34 ng/mL (ref 30–100)

## 2015-11-21 LAB — TESTOSTERONE: Testosterone: 450 ng/dL (ref 250–827)

## 2015-11-21 LAB — PSA: PSA: 0.8 ng/mL (ref <=4.0)

## 2015-11-21 MED ORDER — DOXEPIN HCL 75 MG PO CAPS: ORAL_CAPSULE | ORAL | 1 refills | Status: DC

## 2015-12-03 DIAGNOSIS — M25552 Pain in left hip: Secondary | ICD-10-CM | POA: Diagnosis not present

## 2015-12-04 DIAGNOSIS — M25552 Pain in left hip: Secondary | ICD-10-CM | POA: Diagnosis not present

## 2015-12-12 ENCOUNTER — Other Ambulatory Visit: Payer: Self-pay | Admitting: Physician Assistant

## 2015-12-12 ENCOUNTER — Ambulatory Visit (HOSPITAL_COMMUNITY)
Admission: RE | Admit: 2015-12-12 | Discharge: 2015-12-12 | Disposition: A | Discharge status: Home / Self Care | Payer: Medicare Other | Source: Ambulatory Visit | Attending: Physician Assistant | Admitting: Physician Assistant

## 2015-12-12 DIAGNOSIS — M25552 Pain in left hip: Secondary | ICD-10-CM

## 2015-12-12 DIAGNOSIS — M7062 Trochanteric bursitis, left hip: Secondary | ICD-10-CM | POA: Diagnosis not present

## 2015-12-24 ENCOUNTER — Other Ambulatory Visit: Payer: Self-pay | Admitting: Internal Medicine

## 2015-12-25 ENCOUNTER — Encounter: Payer: Self-pay | Admitting: Neurology

## 2015-12-25 ENCOUNTER — Ambulatory Visit (INDEPENDENT_AMBULATORY_CARE_PROVIDER_SITE_OTHER): Payer: Medicare Other | Admitting: Neurology

## 2015-12-25 VITALS — BP 114/70 | HR 78 | Resp 16 | Ht 69.5 in | Wt 216.0 lb

## 2015-12-25 DIAGNOSIS — I2510 Atherosclerotic heart disease of native coronary artery without angina pectoris: Secondary | ICD-10-CM

## 2015-12-25 DIAGNOSIS — G4733 Obstructive sleep apnea (adult) (pediatric): Secondary | ICD-10-CM | POA: Insufficient documentation

## 2015-12-25 DIAGNOSIS — G4710 Hypersomnia, unspecified: Secondary | ICD-10-CM

## 2015-12-25 DIAGNOSIS — R0683 Snoring: Secondary | ICD-10-CM

## 2015-12-25 DIAGNOSIS — G4730 Sleep apnea, unspecified: Secondary | ICD-10-CM

## 2015-12-25 NOTE — Progress Notes (Signed)
SLEEP MEDICINE CLINIC   Provider:  [name], M D  Referring Provider: [name] Pinto, MD Primary Care Physician:  Alesia Richards, MD  Chief Complaint  Patient presents with  . Sleep Consult    Rm 11. Patient has trouble falling asleep and staying asleep, snores, witnessed apnea, wakes up feeling tired, daytime fatigue    HPI:  Bryan [name] is a 65 y.o. male , seen here as a referral  from Dr. Melford Aase for snoring, witnessed apnea and needs an evaluation.   Last week he was in [place] , [place], and didn't snore. His wife was amazed. He was skiing every day and slept well. Over the last couple of years his snoring has become louder, to a level where his wife often has left the marital bedroom.  His past medical history and medical history is positive for coronary artery disease in the native coronary artery, an 80% stenosis of the distal LAD, 99% stenosis proximal and. A stent was placed. He also has diverticulosis, gastroesophageal reflux disease, hernia, he had supraventricular tachycardia and was treated with an ablation in 2004, hyperlipidemia, hypertension, symptomatic palpitations, PVCs, vitamin D deficiency.  I reviewed his medication; he has silenor/ doxepin  available at bedtime as a sleep aid, takes Zoloft, Crestor, Zofran in case of nausea, lisinopril, gabapentin 3 times a day Zetia by systolic, Plavix, Flexeril 10 mg for muscle spasms as needed. He has noted a very dry mouth and his dentist noted a dry mouth as well, likely related to doxepin. lunesta failed- metal taste. Best on klonopin.   Sleep habits are as follows: The patient is usually in bed by 10 PM but struggles to fall asleep without medication. He is taking doxepin. He sometimes has myoclonic tics at night. He averages 4-6 hours of nocturnal sleep, the bedroom is cool, quiet and dark. He prefers to sleep on his side. 2 pillows for head support usually. He will have one nocturia, during the night ,  sometimes has trouble to go back to sleep. The bathroom break is usually between 2 and 3 AM . He has also 3-4 times a week dreams that he can recall, and sometimes tries to enact. These wake him up- arm and leg movements.  His dreams are not nightmarish in character and he usually does not feel that he has to defend himself or is under threat  He wakes up with a very dry mouth. No headaches, rarely palpitations.  Sleep medical history and family sleep history:  No tonsillectomy, TBI, and no sinus ENT surgery.    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   Never smoked, caffeine , 2 cups of coffee in AM, sometimes Soda, rare tea.  Drinks 2-4 glasses of wine .   Review of Systems: Out of a complete 14 system review, the patient complains of only the following symptoms, and all other reviewed systems are negative.  tinnitus,  Diplopia.  Epworth score 5 , Fatigue severity score 39  , depression score 2/15    Social History   Social History  . Marital status: Married    Spouse name: N/A  . Number of children: 3  . Years of education: N/A   Occupational History  . Not on file.   Social History Main Topics  . Smoking status: Never Smoker  . Smokeless tobacco: Never Used  . Alcohol use Yes     Comment: rarely  . Drug use: No  . Sexual activity: Not on file  Other Topics Concern  . Not on file   Social History Narrative   Drinks 2 cups of coffee a day     Family History  Problem Relation Age of Onset  . Cancer Father     brain  . Colon cancer Mother     malignant polyp of rectosigmoid colon  . Hypothyroidism Mother   . Irritable bowel syndrome Mother     Past Medical History:  Diagnosis Date  . CAD (coronary artery disease), native coronary artery    Cath 04/19/2001  normal Left main, moderate proximal disease, 80% stenosis distal LAD, 99% stenosis proximal OM 1, 60% stenosis mid RCA, 70% stenosis ostial mid PDA  PCI with bare metal stent of OM1  same date 3.5 x 18 mm Multilink    . Diverticulosis   . GERD (gastroesophageal reflux disease)   . Hernia   . History of PSVT    Ablation done in 2004 at [facility]   . Hyperlipidemia   . Hypertension   . Symptomatic PVCs   . Vitamin D deficiency     Past Surgical History:  Procedure Laterality Date  . ACHILLES TENDON REPAIR     left  . APPENDECTOMY    . CARDIAC ELECTROPHYSIOLOGY MAPPING AND ABLATION      Current Outpatient Prescriptions  Medication Sig Dispense Refill  . BYSTOLIC 10 MG tablet take 1 tablet by mouth once daily 90 tablet 1  . clonazePAM (KLONOPIN) 2 MG tablet     . clopidogrel (PLAVIX) 75 MG tablet take 1 tablet by mouth once daily 90 tablet 1  . doxepin (SINEQUAN) 75 MG capsule Take 1 to 2 capsules 1 hour before sleep. 90 capsule 1  . ezetimibe (ZETIA) 10 MG tablet take 1 tablet by mouth once daily 90 tablet 1  . lisinopril (PRINIVIL,ZESTRIL) 20 MG tablet take 1 tablet by mouth twice a day 90 tablet 1  . ondansetron (ZOFRAN) 8 MG tablet 1/2-1 pill as needed every 8 hours for nausea 30 tablet 1  . rosuvastatin (CRESTOR) 10 MG tablet take 1 tablet by mouth once daily 90 tablet 1  . sertraline (ZOLOFT) 50 MG tablet take 1 tablet by mouth once daily 90 tablet 1   No current facility-administered medications for this visit.     Allergies as of 12/25/2015 - Review Complete 12/25/2015  Allergen Reaction Noted  . Amoxicillin Hives and Swelling 11/16/2012  . Lunesta [eszopiclone]  11/16/2012  . Penicillins Swelling 12/10/2011    Vitals: BP 114/70   Pulse 78   Resp 16   Ht 5' 9.5" (1.765 m)   Wt 216 lb (98 kg)   BMI 31.44 kg/m  Last Weight:  Wt Readings from Last 1 Encounters:  12/25/15 216 lb (98 kg)   [id number] mass index is 31.44 kg/m.     Last Height:   Ht Readings from Last 1 Encounters:  12/25/15 5' 9.5" (1.765 m)    Physical exam:  General: The patient is awake, alert and appears not in acute distress. The patient is well groomed. Head:  Normocephalic, atraumatic. Neck is supple. Mallampati 3.  neck circumference: 17. Nasal airflow congested , TMJ is not evident .  Dry mouth, Retrognathia is not seen. All natural teeth.  Cardiovascular:  Regular rate and rhythm, without  murmurs or carotid bruit, and without distended neck veins. Respiratory: Lungs are clear to auscultation. Skin:  Without evidence of edema, or rash Trunk: BMI is high. The patient's posture is erect.    Neurologic  exam : The patient is awake and alert, oriented to place and time.   Attention span & concentration ability appears normal.  Speech is fluent,  without dysarthria, dysphonia or aphasia.  Mood and affect are appropriate.  Cranial nerves: Pupils are equal and briskly reactive to light. No evidence of cataract, he reports diplopia. Horizontal diplopia. Only in evening.  Extraocular movements  in vertical and horizontal planes intact and without nystagmus. Visual fields by finger perimetry are intact. Hearing to finger rub intact. Facial sensation intact to fine touch.  Facial motor strength is symmetric and tongue and uvula move midline. Shoulder shrug was symmetrical.   Motor exam:   Normal tone, muscle bulk and symmetric strength in all extremities.  Sensory:  Fine touch, pinprick and vibration were normal.  Proprioception tested in the upper extremities was normal.  Coordination: Rapid alternating movements in the fingers/hands was normal. Finger-to-nose maneuver  normal without evidence of ataxia, dysmetria or tremor.  Gait and station: Patient walks without assistive device and is able unassisted to climb up to the exam table. Strength within normal limits.  Stance is stable and normal.   Deep tendon reflexes: in the upper and lower extremities are symmetric and intact. Babinski maneuver response is downgoing.  The patient was advised of the nature of the diagnosed sleep disorder , the treatment options and risks for general a health and  wellness arising from not treating the condition.  I spent more than 45 minutes of face to face time with the patient. Greater than 50% of time was spent in counseling and coordination of care. We have discussed the diagnosis and differential and I answered the patient's questions.     Assessment:  After physical and neurologic examination, review of laboratory studies,  Personal review of imaging studies, reports of other /same  Imaging studies ,  Results of polysomnography/ neurophysiology testing and pre-existing records as far as provided in visit., my assessment is   1) Bryan [name] has been witnessed to snore and have apnea as per his spouse's report. He will undergo a sleep study to evaluate him for the presence of obstructive sleep apnea. He had also made an interesting observation that he slept better in high altitude while skiing in [place]. He was also physically probably very much more active than he usually is. Since he does not wake up with headaches I do not need capnography.  2) he has comorbidities that make it easier for him or put him at a higher risk of obstructive sleep apnea, including hypertension, coronary artery disease, hyperlipidemia. He does not have endocrine disorders such as diabetes or thyroid disease. His report of diplopia, horizontal diplopia by driving at night or in the later hours of the day was of great interest to me. I would like to order a myasthenia antibody. He just had during the last month regular metabolic blood test drawn and these seem to have returned normal.  3) dry mouth   Plan:  Treatment plan and additional workup :  SPLIT study , acetyl choline ab.   Rv after sleep study, schedule for January.      Asencion Partridge Keniyah Gelinas MD  12/25/2015   CC: [name] Pinto, [place] [facility], [place] [id number]

## 2015-12-25 NOTE — Addendum Note (Signed)
Addended by: [name] on: 12/25/2015 04:11 PM   Modules accepted: Orders

## 2015-12-26 ENCOUNTER — Other Ambulatory Visit: Payer: Self-pay | Admitting: Internal Medicine

## 2015-12-27 ENCOUNTER — Other Ambulatory Visit: Payer: Self-pay | Admitting: Internal Medicine

## 2016-01-10 ENCOUNTER — Ambulatory Visit (INDEPENDENT_AMBULATORY_CARE_PROVIDER_SITE_OTHER): Payer: Medicare Other | Admitting: Neurology

## 2016-01-10 DIAGNOSIS — G4733 Obstructive sleep apnea (adult) (pediatric): Secondary | ICD-10-CM

## 2016-01-10 DIAGNOSIS — R0683 Snoring: Secondary | ICD-10-CM

## 2016-01-10 DIAGNOSIS — G4710 Hypersomnia, unspecified: Secondary | ICD-10-CM

## 2016-01-10 DIAGNOSIS — G4730 Sleep apnea, unspecified: Secondary | ICD-10-CM

## 2016-01-18 DIAGNOSIS — M5416 Radiculopathy, lumbar region: Secondary | ICD-10-CM | POA: Diagnosis not present

## 2016-01-21 ENCOUNTER — Telehealth: Payer: Self-pay | Admitting: Neurology

## 2016-01-21 DIAGNOSIS — G4730 Sleep apnea, unspecified: Secondary | ICD-10-CM

## 2016-01-21 DIAGNOSIS — G4700 Insomnia, unspecified: Secondary | ICD-10-CM

## 2016-01-21 DIAGNOSIS — G4733 Obstructive sleep apnea (adult) (pediatric): Secondary | ICD-10-CM

## 2016-01-21 NOTE — Telephone Encounter (Signed)
I called pt. I advised him that his sleep study results revealed osa with mild PLMD. Snoring was noted. Dr. Brett Fairy recommend a full-night, attended, cpap titration study to optimize therapy. Positional therapy was advised. I advised pt to lose weight, diet, and exercise if not contraindicated by his other physicians. Pt is agreeable to a cpap titration study. I advised him that our sleep lab will give him a call to schedule his cpap titration study. Pt verbalized understanding of results. Pt had no questions at this time but was encouraged to call back if questions arise.

## 2016-01-21 NOTE — Telephone Encounter (Signed)
The patient took no bathroom breaks. Snoring was noted. EKG with PACs, but was in keeping with normal sinus rhythm (NSR).   IMPRESSION:  1. Obstructive Sleep Apnea(OSA) with a strong supine sleep position accentuation.  2. Mild Periodic Limb Movement Disorder (PLMD) 3. Snoring 4. Repetitive Intrusions of Sleep, "light sleep".  RECOMMENDATIONS:  1. Advise full-night, attended, CPAP titration study to optimize therapy.   2. Positional therapy is advised. 3. Advise to lose weight, diet and exercise if not contraindicated (BMI 32). 4. Further information regarding OSA may be obtained from [employer] (www.sleepfoundation.org) or American Sleep Apnea Association (www.sleepapnea.org). 5. Correlate clinically for a history consistent with regarding restless legs syndrome (RLS).     6. Consider dedicated sleep psychology referral if insomnia is of clinical concern.   7. A follow up appointment will be scheduled in the Sleep Clinic at [facility]. The referring provider will be notified of the results.

## 2016-01-21 NOTE — Telephone Encounter (Signed)
-----   Message from [name], MD sent at 01/21/2016  1:08 PM EST ----- The patient took no bathroom breaks. Snoring was noted. EKG with PACs, but was in keeping with normal sinus rhythm (NSR).   IMPRESSION:  1. Obstructive Sleep Apnea(OSA) with a strong supine sleep position accentuation.  2. Mild Periodic Limb Movement Disorder (PLMD) 3. Snoring 4. Repetitive Intrusions of Sleep, "light sleep".  RECOMMENDATIONS:  1. Advise full-night, attended, CPAP titration study to optimize therapy.   2. Positional therapy is advised. 3. Advise to lose weight, diet and exercise if not contraindicated (BMI 32). 4. Further information regarding OSA may be obtained from [employer] (www.sleepfoundation.org) or American Sleep Apnea Association (www.sleepapnea.org). 5. Correlate clinically for a history consistent with regarding restless legs syndrome (RLS).     6. Consider dedicated sleep psychology referral if insomnia is of clinical concern.   7. A follow up appointment will be scheduled in the Sleep Clinic at [facility]. The referring provider will be notified of the results.

## 2016-01-21 NOTE — Procedures (Signed)
PATIENT'S NAME:  Maged, Moczygemba DOB:      July 02, 1950      MR#:    [id number]     DATE OF RECORDING: 01/10/2016 REFERRING M.D.:  [name] Pinto, MD Study Performed:   Baseline Polysomnogram HISTORY:   DAVID BALDI is a 66 y.o. male , seen here for snoring, witnessed apnea and Insomnia. The patient endorsed the Epworth Sleepiness Scale at 5 points.    The patient's weight 216 pounds with a height of 69 (inches), resulting in a BMI of 32 kg/m2. The patient's neck circumference measured 17 inches.  CURRENT MEDICATIONS: Bystolic, Klonopin, Plavix, Sinequan, Zetia, Lisinopril, Zofran, Crestor, Zoloft   PROCEDURE:  This is a multichannel digital polysomnogram utilizing the Somnostar 11.2 system.  Electrodes and sensors were applied and monitored per AASM Specifications.   EEG, EOG, Chin and Limb EMG, were sampled at 200 Hz.  ECG, Snore and Nasal Pressure, Thermal Airflow, Respiratory Effort, CPAP Flow and Pressure, Oximetry was sampled at 50 Hz. Digital video and audio were recorded.      BASELINE STUDY   Lights Out was at 22:51 and Lights On at 05:01.  Total recording time (TRT) was 370.5 minutes, with a total sleep time (TST) of 253 minutes.   The patient's sleep latency was 35 minutes.  REM latency was 0 minutes.  The sleep efficiency was 68.3 %.     SLEEP ARCHITECTURE: WASO (Wake after sleep onset) was 81 minutes.  There were 60 minutes in Stage N1, 189.5 minutes Stage N2, 3.5 minutes Stage N3 and 0 minutes in Stage REM.  The percentage of Stage N1 was 23.7%, Stage N2 was 74.9%, Stage N3 was 1.4% and Stage R (REM sleep) was 0%.   RESPIRATORY ANALYSIS:  There were a total of 91 respiratory events:  32 obstructive apneas, 0 central apneas and 59 hypopneas with 4 respiratory event related arousals (RERAs).     The total APNEA/HYPOPNEA INDEX (AHI) was 21.6/hour and the total RESPIRATORY DISTURBANCE INDEX was 22.5 /hour.  0 events occurred in REM sleep and 118 events in NREM. The REM AHI was 0 /hour, versus  a non-REM AHI of 21.6. The patient spent 98 minutes of total sleep time in the supine position and 155 minutes in non-supine. The supine AHI was 49.6 versus a non-supine AHI of 3.9.  OXYGEN SATURATION & C02:  The Wake baseline 02 saturation was 94%, with the lowest being 78%. Time spent below 89% saturation equaled 52 minutes.   PERIODIC LIMB MOVEMENTS:   The patient had a total of 109 Periodic Limb Movements.  The Periodic Limb Movement (PLM) index was 25.8 and the PLM Arousal index was 0.2/hour. The arousals were noted as: 24 were spontaneous, 1 was associated with PLM, 48 were associated with respiratory events. Audio and video analysis did not show any abnormal or unusual movements, behaviors, phonations or vocalizations.     The patient took no bathroom breaks. Snoring was noted. EKG with PACs, but was in keeping with normal sinus rhythm (NSR).   IMPRESSION:  1. Obstructive Sleep Apnea(OSA) with a strong supine sleep position accentuation.  2. Mild Periodic Limb Movement Disorder (PLMD) 3. Snoring 4. Repetitive Intrusions of Sleep, "light sleep".  RECOMMENDATIONS:  1. Advise full-night, attended, CPAP titration study to optimize therapy.   2. Positional therapy is advised. 3. Advise to lose weight, diet and exercise if not contraindicated (BMI 32). 4. Further information regarding OSA may be obtained from [employer] (www.sleepfoundation.org) or American Sleep Apnea Association (  www.sleepapnea.org). 5. Correlate clinically for a history consistent with regarding restless legs syndrome (RLS).     6. Consider dedicated sleep psychology referral if insomnia is of clinical concern.   7. A follow up appointment will be scheduled in the Sleep Clinic at [facility]. The referring provider will be notified of the results.     I certify that I have reviewed the entire raw data recording prior to the issuance of this report in accordance with the Standards  of Accreditation of the American Academy of Sleep Medicine (AASM)   [name], MD  01-21-2016 Diplomat, American Board of Psychiatry and Neurology  Diplomat, American Board of [place] Director, [facility] [employer]

## 2016-01-24 ENCOUNTER — Encounter: Payer: Self-pay | Admitting: Physician Assistant

## 2016-01-24 ENCOUNTER — Other Ambulatory Visit: Payer: Self-pay | Admitting: Physician Assistant

## 2016-01-24 MED ORDER — PREDNISONE 20 MG PO TABS: ORAL_TABLET | ORAL | 0 refills | Status: AC

## 2016-01-24 MED ORDER — ACETAMINOPHEN-CODEINE #3 300-30 MG PO TABS: 1.0000 | ORAL_TABLET | Freq: Four times a day (QID) | ORAL | 0 refills | Status: DC | PRN

## 2016-02-04 DIAGNOSIS — M5442 Lumbago with sciatica, left side: Secondary | ICD-10-CM | POA: Diagnosis not present

## 2016-02-04 DIAGNOSIS — M545 Low back pain: Secondary | ICD-10-CM | POA: Diagnosis not present

## 2016-02-04 DIAGNOSIS — M25552 Pain in left hip: Secondary | ICD-10-CM | POA: Diagnosis not present

## 2016-02-05 ENCOUNTER — Ambulatory Visit (INDEPENDENT_AMBULATORY_CARE_PROVIDER_SITE_OTHER): Payer: Medicare Other | Admitting: Neurology

## 2016-02-05 DIAGNOSIS — G4700 Insomnia, unspecified: Secondary | ICD-10-CM

## 2016-02-05 DIAGNOSIS — G4733 Obstructive sleep apnea (adult) (pediatric): Secondary | ICD-10-CM | POA: Diagnosis not present

## 2016-02-05 DIAGNOSIS — G4730 Sleep apnea, unspecified: Secondary | ICD-10-CM

## 2016-02-06 ENCOUNTER — Telehealth: Payer: Self-pay | Admitting: Neurology

## 2016-02-06 DIAGNOSIS — G4733 Obstructive sleep apnea (adult) (pediatric): Secondary | ICD-10-CM

## 2016-02-06 NOTE — Procedures (Signed)
PATIENT'S NAME:  Bryan [name], Bryan [name] DOB:      1950-07-06      MR#:    [id number]     DATE OF RECORDING: 02/05/2016 REFERRING M.D.:  [name] Pinto, MD Study Performed:   CPAP  Titration HISTORY:   The patient returns after his sleep study with [place] from 01/11/16 documented a total APNEA/ HYPOPNEAINDEX (AHI) at 21.6/hr., supine position accentuated, mild PLMs, loud snoring and loss of REM sleep, with SpO2 nadir of 89%.   Bryan [name] is seen here for snoring, witnessed apnea and Insomnia. The patient endorsed the Epworth Sleepiness Scale at 5 points.  The patient's weight 216 pounds with a height of 69 (inches), resulting in a BMI of 32 kg/m2. The patient's neck circumference measured 17 inches.  CURRENT MEDICATIONS: Bystolic, Klonopin, Plavix, Sinequan, Zetia, Lisinopril, Zofran, Crestor, Zoloft    PROCEDURE:  This is a multichannel digital polysomnogram utilizing the SomnoStar 11.2 system.  Electrodes and sensors were applied and monitored per AASM Specifications.   EEG, EOG, Chin and Limb EMG, were sampled at 200 Hz.  ECG, Snore and Nasal Pressure, Thermal Airflow, Respiratory Effort, CPAP Flow and Pressure, Oximetry was sampled at 50 Hz. Digital video and audio were recorded.      CPAP was initiated at 5 cmH20 with heated humidity per AASM split night standards and pressure was advanced to 11/11cmH20 because of hypopneas, apneas and desaturations.  At a PAP pressure of 11 cmH20, there was a reduction of the AHI to 0.0 with improvement of snoring and reduction in AHI.  Lights Out was at 22:09 and Lights On at 05:15. Total recording time (TRT) was 426.5 minutes, with a total sleep time (TST) of 390.5 minutes. The patient's sleep latency was 11 minutes with 0 minutes of wake time after sleep onset. REM latency was 123 minutes.  The sleep efficiency was 91.6 %.    SLEEP ARCHITECTURE: WASO (Wake after sleep onset) was 25 minutes.  There were 8.5 minutes in Stage N1, 217.5 minutes Stage N2, 68.5 minutes Stage  N3 and 96 minutes in Stage REM.  The percentage of Stage N1 was 2.2%, Stage N2 was 55.7%, Stage N3 was 17.5% and Stage R (REM sleep) was 24.6%. The sleep architecture was notable for REM sleep rebound.   RESPIRATORY ANALYSIS:  There were 6 respiratory events: 2 obstructive apneas, 0 central apneas and 4 hypopneas with 0 respiratory event related arousals (RERAs).     The total APNEA/HYPOPNEA INDEX  (AHI) was 0.9 /hour and the total RESPIRATORY DISTURBANCE INDEX was 0.9 /hr.  6 events occurred in REM sleep and 0 events in NREM. The REM AHI was 3.8 /hour versus a non-REM AHI of 0.0 /hour.  The patient spent 263 minutes of total sleep time in the supine position and 128 minutes in non-supine. The supine AHI was 1.4, versus a non-supine AHI of 0.0.  OXYGEN SATURATION & C02:  The baseline 02 saturation was 94%, with the lowest being 68%. Time spent below 89% saturation equaled 9 minutes.  PERIODIC LIMB MOVEMENTS:    The patient had a total of 494 Periodic Limb Movements. The Periodic Limb Movement (PLM) index was 75.9 and the PLM Arousal index was 1.2 /hour.  The arousals were noted as: 3 were spontaneous, 8 were associated with PLMs, 2 were associated with respiratory events.  Audio and video analysis did not show any abnormal or unusual movements, behaviors, phonations or vocalizations.   The patient took no bathroom breaks. Loud Snoring was noted. EKG  was in keeping with normal sinus rhythm (NSR).   DIAGNOSIS : Obstructive Sleep Apnea, responding to CPAP at 11 cm water with heated humidity and under use of an AirFit P 10 nasal pillow, medium size. 1. Persistent severe PLMs with frequent arousals under CPAP. These did not occur while in REM sleep.  2. Oxygen nadir was low at 68%, but not sustained.   PLANS/RECOMMENDATIONS: CPAP at 11 cm water, heated humidity. The patient was fitted with a ResMed AirFit P10 with medium pillows apparatus. a. Weight loss if appropriate( BMI 32) b. Avoidance of  medications with muscle relaxant properties, alcohol or sedatives. c. Avoiding sleeping in the supine position (on one's back). d. Avoidance of driving if or when sleepy. 2. PLMS:  Exclude secondary factors for periodic limb movements of sleep.  Check ferritin, renal function, electrolytes, magnesium, calcium, B12, folate (treat deficiencies if present; serum ferritin values less than 50 ug/L should be treated with iron replacement, along with appropriate investigation for causes of iron deficiency).  Exclude any history or physical examination data supporting peripheral neuropathy.   A follow up appointment will be scheduled in the Sleep Clinic at [facility].   Please call [phone] with any questions.      I certify that I have reviewed the entire raw data recording prior to the issuance of this report in accordance with the Standards of Accreditation of the American Academy of Sleep Medicine (AASM)   [name], M.D.  02-06-2016 Diplomat, American Board of Psychiatry and Neurology  Diplomat, [place] of Sleep Medicine Medical Director, [place] [facility] at [facility]

## 2016-02-06 NOTE — Telephone Encounter (Signed)
DIAGNOSIS : Obstructive Sleep Apnea, responding to CPAP at 11 cm water with heated humidity and under use of an AirFit P 10 nasal pillow, medium size. 1. Persistent severe PLMs with frequent arousals under CPAP. These did not occur while in REM sleep.  2. Oxygen nadir was low at 68%, but not sustained.   PLANS/RECOMMENDATIONS: CPAP at 11 cm water, heated humidity. The patient was fitted with a ResMed AirFit P10 with medium pillows apparatus. a. Weight loss if appropriate( BMI 32) b. Avoidance of medications with muscle relaxant properties, alcohol or sedatives. c. Avoiding sleeping in the supine position (on one's back). d. Avoidance of driving if or when sleepy. 2. PLMS:  Exclude secondary factors for periodic limb movements of sleep.  Check ferritin, renal function, electrolytes, magnesium, calcium, B12, folate (treat deficiencies if present; serum ferritin values less than 50 ug/L should be treated with iron replacement, along with appropriate investigation for causes of iron deficiency).  Exclude any history or physical examination data supporting peripheral neuropathy.   A follow up appointment will be scheduled in the Sleep Clinic at [facility].   Please call [phone] with any questions.      I certify that I have reviewed the entire raw data recording prior to the issuance of this report in accordance with the Standards of Accreditation of the American Academy of Sleep Medicine (AASM)   [name], M.D.  02-06-2016 Diplomat, American Board of Psychiatry and Neurology  Diplomat, [place] of Sleep Medicine Medical Director, [place] [facility] at [facility]

## 2016-02-07 DIAGNOSIS — M5416 Radiculopathy, lumbar region: Secondary | ICD-10-CM | POA: Diagnosis not present

## 2016-02-07 NOTE — Telephone Encounter (Signed)
-----   Message from [name], MD sent at 02/06/2016  4:50 PM EST ----- Obstructive Sleep Apnea, responding to CPAP at 11 cm water with heated humidity and under use of an AirFit P 10 nasal pillow, medium size. 1. Persistent severe PLMs with frequent arousals under CPAP. These did not occur while in REM sleep.  2. Oxygen nadir was low at 68%, but not sustained.

## 2016-02-07 NOTE — Telephone Encounter (Addendum)
I called pt. I advised him that his sleep study revealed osa but that it responded to cpap. Pt had persistent, severe, PLMs with frequent arousals under CPAP, but these did not occur while in REM sleep. Pt's oxygen nadir was low at 68% but it was not sustained. Dr. Brett Fairy recommends that pt start a cpap at home and pt is agreeable to this. I also advised pt that Dr. Brett Fairy can exclude secondary factors for PLMS of sleep if pt would like at pt's follow up appt.i advised pt that I will send his order for cpap to a DME, [facility], and they will call him within a week to get his cpap started. I reviewed cpap compliance expectations with the pt. Pt is agreeable to a follow up appt on 04/15/16 at 9:30am. Pt verbalized understanding of results.  Pt said that he tried one of his wife's lunesta pills and it worked will for him. Pt would like to know if Dr. Brett Fairy will prescribe this for him.

## 2016-02-08 MED ORDER — ESZOPICLONE 2 MG PO TABS: 2.0000 mg | ORAL_TABLET | Freq: Every evening | ORAL | 0 refills | Status: DC | PRN

## 2016-02-08 NOTE — Telephone Encounter (Signed)
I called pt and LMVM for him that prescription Johnnye Sima) faxed to [facility] aide [phone].  sy

## 2016-02-08 NOTE — Telephone Encounter (Signed)
Yes, I will order Lunesta , as I think it will help with the adjustment period to CPAP. I do not expect this to be needed indefinitely , no refills

## 2016-02-11 ENCOUNTER — Telehealth: Payer: Self-pay

## 2016-02-11 NOTE — Telephone Encounter (Signed)
Pa for lunesta was completed with [facility]. PA approved from 11/13/15 to 02/10/17. [id number].  [facility] on [address] notified.

## 2016-02-13 ENCOUNTER — Other Ambulatory Visit: Payer: Self-pay | Admitting: Internal Medicine

## 2016-02-19 DIAGNOSIS — M5442 Lumbago with sciatica, left side: Secondary | ICD-10-CM | POA: Diagnosis not present

## 2016-02-19 DIAGNOSIS — M25552 Pain in left hip: Secondary | ICD-10-CM | POA: Diagnosis not present

## 2016-02-19 DIAGNOSIS — M545 Low back pain: Secondary | ICD-10-CM | POA: Diagnosis not present

## 2016-02-29 ENCOUNTER — Other Ambulatory Visit: Payer: Self-pay | Admitting: Physician Assistant

## 2016-03-04 ENCOUNTER — Ambulatory Visit: Payer: Self-pay | Admitting: Internal Medicine

## 2016-03-05 ENCOUNTER — Other Ambulatory Visit: Payer: Self-pay | Admitting: Internal Medicine

## 2016-03-06 ENCOUNTER — Other Ambulatory Visit: Payer: Self-pay | Admitting: Physical Medicine and Rehabilitation

## 2016-03-06 ENCOUNTER — Ambulatory Visit (INDEPENDENT_AMBULATORY_CARE_PROVIDER_SITE_OTHER): Payer: Medicare Other | Admitting: Physician Assistant

## 2016-03-06 ENCOUNTER — Encounter: Payer: Self-pay | Admitting: Physician Assistant

## 2016-03-06 ENCOUNTER — Ambulatory Visit
Admission: RE | Admit: 2016-03-06 | Discharge: 2016-03-06 | Disposition: A | Discharge status: Home / Self Care | Payer: Medicare Other | Source: Ambulatory Visit | Attending: Physical Medicine and Rehabilitation | Admitting: Physical Medicine and Rehabilitation

## 2016-03-06 ENCOUNTER — Telehealth: Payer: Self-pay | Admitting: *Deleted

## 2016-03-06 VITALS — BP 128/80 | HR 93 | Temp 97.3°F | Resp 14 | Ht 69.5 in | Wt 213.6 lb

## 2016-03-06 DIAGNOSIS — F5101 Primary insomnia: Secondary | ICD-10-CM

## 2016-03-06 DIAGNOSIS — Z0001 Encounter for general adult medical examination with abnormal findings: Secondary | ICD-10-CM

## 2016-03-06 DIAGNOSIS — E782 Mixed hyperlipidemia: Secondary | ICD-10-CM

## 2016-03-06 DIAGNOSIS — M25552 Pain in left hip: Secondary | ICD-10-CM

## 2016-03-06 DIAGNOSIS — I152 Hypertension secondary to endocrine disorders: Secondary | ICD-10-CM

## 2016-03-06 DIAGNOSIS — G4733 Obstructive sleep apnea (adult) (pediatric): Secondary | ICD-10-CM

## 2016-03-06 DIAGNOSIS — I471 Supraventricular tachycardia, unspecified: Secondary | ICD-10-CM

## 2016-03-06 DIAGNOSIS — Z1159 Encounter for screening for other viral diseases: Secondary | ICD-10-CM | POA: Diagnosis not present

## 2016-03-06 DIAGNOSIS — Z136 Encounter for screening for cardiovascular disorders: Secondary | ICD-10-CM

## 2016-03-06 DIAGNOSIS — Z23 Encounter for immunization: Secondary | ICD-10-CM | POA: Diagnosis not present

## 2016-03-06 DIAGNOSIS — I493 Ventricular premature depolarization: Secondary | ICD-10-CM

## 2016-03-06 DIAGNOSIS — Z0000 Encounter for general adult medical examination without abnormal findings: Secondary | ICD-10-CM

## 2016-03-06 DIAGNOSIS — R7303 Prediabetes: Secondary | ICD-10-CM

## 2016-03-06 DIAGNOSIS — E559 Vitamin D deficiency, unspecified: Secondary | ICD-10-CM | POA: Diagnosis not present

## 2016-03-06 DIAGNOSIS — Z79899 Other long term (current) drug therapy: Secondary | ICD-10-CM | POA: Diagnosis not present

## 2016-03-06 DIAGNOSIS — I2510 Atherosclerotic heart disease of native coronary artery without angina pectoris: Secondary | ICD-10-CM | POA: Diagnosis not present

## 2016-03-06 DIAGNOSIS — N182 Chronic kidney disease, stage 2 (mild): Secondary | ICD-10-CM

## 2016-03-06 DIAGNOSIS — R6889 Other general symptoms and signs: Secondary | ICD-10-CM

## 2016-03-06 DIAGNOSIS — R102 Pelvic and perineal pain: Secondary | ICD-10-CM | POA: Diagnosis not present

## 2016-03-06 LAB — CBC WITH DIFFERENTIAL/PLATELET
BASOS PCT: 0 %
Basophils Absolute: 0 cells/uL (ref 0–200)
EOS ABS: 228 {cells}/uL (ref 15–500)
Eosinophils Relative: 2 %
HEMATOCRIT: 47.6 % (ref 38.5–50.0)
HEMOGLOBIN: 16 g/dL (ref 13.2–17.1)
Lymphocytes Relative: 16 %
Lymphs Abs: 1824 cells/uL (ref 850–3900)
MCH: 29.7 pg (ref 27.0–33.0)
MCHC: 33.6 g/dL (ref 32.0–36.0)
MCV: 88.3 fL (ref 80.0–100.0)
MONO ABS: 912 {cells}/uL (ref 200–950)
MPV: 10 fL (ref 7.5–12.5)
Monocytes Relative: 8 %
NEUTROS ABS: 8436 {cells}/uL — AB (ref 1500–7800)
Neutrophils Relative %: 74 %
Platelets: 239 10*3/uL (ref 140–400)
RBC: 5.39 MIL/uL (ref 4.20–5.80)
RDW: 13.5 % (ref 11.0–15.0)
WBC: 11.4 10*3/uL — ABNORMAL HIGH (ref 3.8–10.8)

## 2016-03-06 LAB — BASIC METABOLIC PANEL WITH GFR
BUN: 14 mg/dL (ref 7–25)
CHLORIDE: 103 mmol/L (ref 98–110)
CO2: 26 mmol/L (ref 20–31)
Calcium: 9.5 mg/dL (ref 8.6–10.3)
Creat: 1.09 mg/dL (ref 0.70–1.25)
GFR, Est African American: 82 mL/min (ref >=60)
GFR, Est Non African American: 71 mL/min (ref >=60)
GLUCOSE: 94 mg/dL (ref 65–99)
POTASSIUM: 4.3 mmol/L (ref 3.5–5.3)
Sodium: 139 mmol/L (ref 135–146)

## 2016-03-06 LAB — HEPATIC FUNCTION PANEL
ALBUMIN: 4.3 g/dL (ref 3.6–5.1)
ALK PHOS: 57 U/L (ref 40–115)
ALT: 47 U/L — ABNORMAL HIGH (ref 9–46)
AST: 23 U/L (ref 10–35)
BILIRUBIN TOTAL: 0.7 mg/dL (ref 0.2–1.2)
Bilirubin, Direct: 0.1 mg/dL (ref <=0.2)
Indirect Bilirubin: 0.6 mg/dL (ref 0.2–1.2)
TOTAL PROTEIN: 6.7 g/dL (ref 6.1–8.1)

## 2016-03-06 LAB — LIPID PANEL
Cholesterol: 151 mg/dL (ref <200)
HDL: 55 mg/dL (ref >40)
LDL CALC: 41 mg/dL (ref <100)
Total CHOL/HDL Ratio: 2.7 Ratio (ref <5.0)
Triglycerides: 277 mg/dL — ABNORMAL HIGH (ref <150)
VLDL: 55 mg/dL — ABNORMAL HIGH (ref <30)

## 2016-03-06 LAB — TSH: TSH: 0.96 mIU/L (ref 0.40–4.50)

## 2016-03-06 LAB — MAGNESIUM: MAGNESIUM: 1.9 mg/dL (ref 1.5–2.5)

## 2016-03-06 NOTE — Patient Instructions (Signed)
Essential Tremor A tremor is trembling or shaking that you cannot control. Most tremors affect the hands or arms. Tremors can also affect the head, vocal cords, face, and other parts of the body. Essential tremor is a tremor without a known cause. What are the causes? Essential tremor has no known cause. What increases the risk? You may be at greater risk of essential tremor if:  You have a family member with essential tremor.  You are age 66 or older.  You take certain medicines. What are the signs or symptoms? The main sign of a tremor is uncontrolled and unintentional rhythmic shaking of a body part.  You may have difficulty eating with a spoon or fork.  You may have difficulty writing.  You may nod your head up and down or side to side.  You may have a quivering voice. Your tremors:  May get worse over time.  May come and go.  May be more noticeable on one side of your body.  May get worse due to stress, fatigue, caffeine, and extreme heat or cold. How is this diagnosed? Your health care provider can diagnose essential tremor based on your symptoms, medical history, and a physical examination. There is no single test to diagnose an essential tremor. However, your health care provider may perform a variety of tests to rule out other conditions. Tests may include:  Blood and urine tests.  Imaging studies of your brain, such as:  CT scan.  MRI.  A test that measures involuntary muscle movement (electromyogram). How is this treated? Your tremors may go away without treatment. Mild tremors may not need treatment if they do not affect your day-to-day life. Severe tremors may need to be treated using one or a combination of the following options:  Medicines. This may include medicine that is injected.  Lifestyle changes.  Physical therapy. Follow these instructions at home:  Take medicines only as directed by your health care provider.  Limit alcohol intake to no  more than 1 drink per day for nonpregnant women and 2 drinks per day for men. One drink equals 12 oz of beer, 5 oz of wine, or 1 oz of hard liquor.  Do not use any tobacco products, including cigarettes, chewing tobacco, or electronic cigarettes. If you need help quitting, ask your health care provider.  Take medicines only as directed by your health care provider.  Avoid extreme heat or cold.  Limit the amount of caffeine you consumeas directed by your health care provider.  Try to get eight hours of sleep each night.  Find ways to manage your stress, such as meditation or yoga.  Keep all follow-up visits as directed by your health care provider. This is important. This includes any physical therapy visits. Contact a health care provider if:  You experience any changes in the location or intensity of your tremors.  You start having a tremor after starting a new medicine.  You have tremor with other symptoms such as:  Numbness.  Tingling.  Pain.  Weakness.  Your tremor gets worse.  Your tremor interferes with your daily life. This information is not intended to replace advice given to you by your health care provider. Make sure you discuss any questions you have with your health care provider. Document Released: 01/13/2014 Document Revised: 05/31/2015 Document Reviewed: 06/20/2013 Elsevier Interactive Patient Education  2017 Elsevier Inc.  

## 2016-03-06 NOTE — Progress Notes (Signed)
WELCOME TO MEDICARE AND FOLLOW UP  Assessment:    Hypertension due to endocrine disorder - continue medications, DASH diet, exercise and monitor at home. Call if greater than 130/80.  -     CBC with Differential/Platelet -     BASIC METABOLIC PANEL WITH GFR -     Hepatic function panel -     TSH -     EKG 12-Lead  Coronary artery disease involving native coronary artery of native heart without angina pectoris Control blood pressure, cholesterol, glucose, increase exercise.  -     EKG 12-Lead  History of PSVT Continue cardio follow up -     TSH  Symptomatic PVCs  Continue cardio follow up -     TSH  OSA (obstructive sleep apnea) Follow up with Dr. Brett Fairy.   CKD (chronic kidney disease) stage 2, GFR 60-89 ml/min Increase fluids, avoid NSAIDS, monitor sugars, will monitor -     BASIC METABOLIC PANEL WITH GFR  Mixed hyperlipidemia -continue medications, check lipids, decrease fatty foods, increase activity.  -     Lipid panel -     [place], RETROPERITNL ABD,  LTD  Morbid obesity (BMI 30.85)    (HCC) - long discussion about weight loss, diet, and exercise  Prediabetes Discussed general issues about diabetes pathophysiology and management., Educational material distributed., Suggested low cholesterol diet., Encouraged aerobic exercise., Discussed foot care., Reminded to get yearly retinal exam.  Vitamin D deficiency Continue supplement  Medication management -     Magnesium  Primary insomnia Follow up with Dr. Brett Fairy.  -     TSH  Screening for cardiovascular condition -     [place], RETROPERITNL ABD,  LTD  Need for diphtheria-tetanus-pertussis (Tdap) vaccine -     Tdap vaccine greater than or equal to 7yo IM  Screening for viral disease -     HIV antibody -     Hepatitis C antibody  Over 40 minutes of exam, counseling, chart review and critical decision making was performed  Future Appointments Date Time Provider [place]  04/15/2016 9:30 AM [name], MD [facility] None  06/05/2016 9:30 AM [name] Pinto, MD [facility] None  12/18/2016 9:00 AM [name] Pinto, MD [facility] None     Plan:   During the course of the visit the patient was educated and counseled about appropriate screening and preventive services including:    Pneumococcal vaccine  Prevnar 13   Influenza vaccine  Td vaccine  Screening electrocardiogram  Bone densitometry screening  Colorectal cancer screening  Diabetes screening  Glaucoma screening  Nutrition counseling   Advanced directives: requested   Subjective:  Bryan [name] is a 66 y.o. male who presents for [facility] Annual Wellness Visit and follow up    His blood pressure has been controlled at home, today their BP is BP: 128/80   He does workout. He denies chest pain, shortness of breath, dizziness. He has not been working out as much due to hip/back pain, scheduled for [facility] lumbar today, has been on lyrica TID for pain, continuing pain radiation down left leg.  He has history of CAD s/p stent 2003, with history of PSVT s/p ablation in 2004, still on plavix and follows with cardio, last Cardiolite 2010.    He is on cholesterol medication and denies myalgias. His cholesterol is not at goal. The cholesterol last visit was:   Lab Results  Component Value Date   CHOL 181 11/20/2015   HDL 46 11/20/2015   LDLCALC  NOT CALC 11/20/2015   TRIG 471 (H) 11/20/2015   CHOLHDL 3.9 11/20/2015   Last A1C  Lab Results  Component Value Date   HGBA1C 5.2 11/20/2015   Patient is on Vitamin D supplement, 20,000 IU three days a week. .   Lab Results  Component Value Date   VD25OH 34 11/20/2015     BMI is Body mass index is 31.09 kg/m., he is working on diet and exercise. He does have OSA and just started on CPAP, has woken up 2-3 nights has taken off mask in the night. Has not been able to tolerate lunesta, wakes up with bad taste in his mouth. Took xanax and Doxepin last night and  slept well.  Wt Readings from Last 3 Encounters:  03/06/16 213 lb 9.6 oz (96.9 kg)  12/25/15 216 lb (98 kg)  11/20/15 209 lb 3.2 oz (94.9 kg)     Medication Review: Current Outpatient Prescriptions on File Prior to Visit  Medication Sig Dispense Refill  . acetaminophen-codeine (TYLENOL #3) 300-30 MG tablet Take 1 tablet by mouth every 6 (six) hours as needed for moderate pain. 20 tablet 0  . BYSTOLIC 10 MG tablet take 1 tablet by mouth once daily 90 tablet 1  . clonazePAM (KLONOPIN) 2 MG tablet take 1/2 to 1 tablet by mouth at bedtime (MAX 5 TABLETS A WEEK) 60 tablet 1  . clopidogrel (PLAVIX) 75 MG tablet take 1 tablet by mouth once daily 90 tablet 1  . doxepin (SINEQUAN) 75 MG capsule Take 1 to 2 capsules 1 hour before sleep. 90 capsule 1  . eszopiclone (LUNESTA) 2 MG TABS tablet Take 1 tablet (2 mg total) by mouth at bedtime as needed for sleep. Take immediately before bedtime 90 tablet 0  . ezetimibe (ZETIA) 10 MG tablet take 1 tablet by mouth once daily 90 tablet 1  . lisinopril (PRINIVIL,ZESTRIL) 20 MG tablet take 1 tablet by mouth twice a day 90 tablet 1  . ondansetron (ZOFRAN) 8 MG tablet 1/2-1 pill as needed every 8 hours for nausea 30 tablet 1  . rosuvastatin (CRESTOR) 10 MG tablet take 1 tablet by mouth once daily 90 tablet 1  . sertraline (ZOLOFT) 50 MG tablet take 1 tablet by mouth once daily 90 tablet 1   No current facility-administered medications on file prior to visit.     Allergies: Allergies  Allergen Reactions  . Amoxicillin Hives and Swelling  . Lunesta [Eszopiclone]     Restless legs  . Penicillins Swelling    Current Problems (verified) Patient Active Problem List   Diagnosis Date Noted  . OSA (obstructive sleep apnea) 12/25/2015  . Insomnia 01/10/2015  . BMI 30.85,adult 10/23/2014  . Morbid obesity (BMI 30.85)    ([place]) 10/23/2014  . CKD (chronic kidney disease) stage 2, GFR 60-89 ml/min 06/20/2014  . Symptomatic PVCs   . Prediabetes 06/02/2013  .  Medication management 06/02/2013  . CAD (coronary artery disease), native coronary artery   . History of PSVT   . Hyperlipidemia   . Hypertension   . Vitamin D deficiency     Screening Tests Immunization History  Administered Date(s) Administered  . Influenza Split 10/13/2013, 10/23/2014  . Influenza,inj,quad, With Preservative 01/13/2013  . Influenza-Unspecified 10/07/2011  . PPD Test 10/13/2013, 10/23/2014  . Pneumococcal Conjugate-13 10/13/2013  . Pneumococcal Polysaccharide-23 10/23/2014  . Td 01/06/2006  . Zoster 09/22/2011    Preventative care: Last colonoscopy: 2013 CXR 2017 MRI lumbar/hip 2017  Prior vaccinations: TD or Tdap: DUE  Influenza: 2016 Pneumococcal: 2016 Prevnar13: 2015 Shingles/Zostavax: 2013  Names of Other Physician/Practitioners you currently use: 1. [facility] here for primary care 2. Lens, eye doctor, last visit 2016 3. Turner, dentist, last visit 2018 Patient Care Team: [name] Pinto, MD as PCP - General (Internal Medicine)  Surgical: Past Surgical History:  Procedure Laterality Date  . ACHILLES TENDON REPAIR     left  . APPENDECTOMY    . CARDIAC ELECTROPHYSIOLOGY MAPPING AND ABLATION     Family His family history includes Cancer in his father; Colon cancer in his mother; Hypothyroidism in his mother; Irritable bowel syndrome in his mother. Social history  He reports that he has never smoked. He has never used smokeless tobacco. He reports that he drinks alcohol. He reports that he does not use drugs.  MEDICARE WELLNESS OBJECTIVES: Physical activity: Current Exercise Habits: The patient does not participate in regular exercise at present, Exercise limited by: orthopedic condition(s) Cardiac risk factors: Cardiac Risk Factors include: advanced age (>20men, >53 women);dyslipidemia;family history of premature cardiovascular disease;hypertension;male gender;obesity (BMI >30kg/m2) Depression/mood  screen:   Depression screen [facility] 2/9 03/06/2016  Decreased Interest 0  Down, Depressed, Hopeless 0  PHQ - 2 Score 0    ADLs:  In your present state of health, do you have any difficulty performing the following activities: 03/06/2016 11/21/2015  Hearing? N N  Vision? N N  Difficulty concentrating or making decisions? N N  Walking or climbing stairs? N N  Dressing or bathing? N N  Doing errands, shopping? N N  Some recent data might be hidden     Cognitive Testing  Alert? Yes  Normal Appearance?Yes  Oriented to person? Yes  Place? Yes   Time? Yes  Recall of three objects?  Yes  Can perform simple calculations? Yes  Displays appropriate judgment?Yes  Can read the correct time from a watch face?Yes  EOL planning: Does Patient Have a Medical Advance Directive?: Yes Type of Advance Directive: Healthcare Power of Attorney, Living will Copy of [place] in Chart?: No - copy requested  Review of Systems  Constitutional: Negative.   HENT: Negative.   Eyes: Negative.   Respiratory: Negative.   Cardiovascular: Negative.   Gastrointestinal: Negative.   Genitourinary: Negative.   Musculoskeletal: Positive for back pain and joint pain. Negative for falls, myalgias and neck pain.  Skin: Negative.   Neurological: Positive for tremors (with intention x 2-3 months). Negative for dizziness, tingling, sensory change, speech change, focal weakness, seizures, loss of consciousness and headaches.  Endo/Heme/Allergies: Negative.   Psychiatric/Behavioral: Negative.      Objective:     Today's Vitals   03/06/16 0839  BP: 128/80  Pulse: 93  Resp: 14  Temp: 97.3 F (36.3 C)  SpO2: 97%  Weight: 213 lb 9.6 oz (96.9 kg)  Height: 5' 9.5" (1.765 m)  PainSc: 1    Body mass index is 31.09 kg/m.  General appearance: alert, no distress, WD/WN, male HEENT: normocephalic, sclerae anicteric, TMs pearly, nares patent, no discharge or erythema, pharynx normal Oral cavity: MMM, no  lesions Neck: supple, no lymphadenopathy, no thyromegaly, no masses Heart: RRR, normal S1, S2, no murmurs Lungs: CTA bilaterally, no wheezes, rhonchi, or rales Abdomen: +bs, soft, non tender, non distended, no masses, no hepatomegaly, no splenomegaly Musculoskeletal: nontender, no swelling, no obvious deformity, + tenderness left greater trochanter, left anterior hip.  Extremities: no edema, no cyanosis, no clubbing Pulses: 2+ symmetric, upper and lower extremities, normal cap refill  Neurological: alert, oriented x 3, CN2-12 intact, strength normal upper extremities and lower extremities, sensation normal throughout, DTRs 2+ throughout, no cerebellar signs, gait Normal Psychiatric: normal affect, behavior normal, pleasant   Medicare Attestation I have personally reviewed: The patient's medical and social history Their use of alcohol, tobacco or illicit drugs Their current medications and supplements The patient's functional ability including ADLs,fall risks, home safety risks, cognitive, and hearing and visual impairment Diet and physical activities Evidence for depression or mood disorders  The patient's weight, height, BMI, and visual acuity have been recorded in the chart.  I have made referrals, counseling, and provided education to the patient based on review of the above and I have provided the patient with a written personalized care plan for preventive services.     Vicie Mutters, PA-C   03/06/2016

## 2016-03-07 LAB — HEPATITIS C ANTIBODY: HCV AB: NEGATIVE

## 2016-03-07 LAB — HIV ANTIBODY (ROUTINE TESTING W REFLEX): HIV 1&2 Ab, 4th Generation: NONREACTIVE

## 2016-03-10 ENCOUNTER — Telehealth: Payer: Self-pay | Admitting: Neurology

## 2016-03-10 NOTE — Telephone Encounter (Signed)
Patient called office to notify nurse that he did confirm he has 1 refill left at his pharmacy for the [place].

## 2016-03-10 NOTE — Telephone Encounter (Signed)
Patient called office in reference to eszopiclone (LUNESTA) 2 MG TABS tablet stating he is waking up with a heavy metal taste to where he is almost gagging.  Patient is requesting to see if he can be prescribed Klonopin because he has taken it in the past with good results.  Please call

## 2016-03-10 NOTE — Telephone Encounter (Signed)
I called pt. He says that he has a very metallic taste in his mouth when taking lunesta and that he he still is only sleeping 3-4 hours per night.  Pt says that he has not had any klonopin in over year. I advised him that it appears that Vicie Mutters, [place] prescribed him klonopin in December of 2017 for 60 tablets and 1 refill. Pt says that he did not get this RX. Pt says that the klonopin does help him sleep.  Pt says that since he is a pt of Dr. Brett Fairy for sleep, he would like her to prescribe klonopin for him.

## 2016-03-10 NOTE — Telephone Encounter (Signed)
It appears that Vicie Mutters, [place] is prescribing the pt klonopin. I called pt to discuss. No answer, left a message asking pt to call me back.

## 2016-03-10 NOTE — Telephone Encounter (Signed)
Patient returning your call.

## 2016-03-11 MED ORDER — CLONAZEPAM 2 MG PO TABS: ORAL_TABLET | ORAL | 1 refills | Status: DC

## 2016-03-11 NOTE — Addendum Note (Signed)
Addended by: [name] on: 03/11/2016 01:01 PM   Modules accepted: Orders

## 2016-03-11 NOTE — Telephone Encounter (Signed)
I do treat organic sleep disorders, not chronic insomnia. I left him a VM explaining that I will not take that prescription over. CD

## 2016-03-11 NOTE — Addendum Note (Signed)
Addended by: Lester [place] A on: 03/11/2016 01:15 PM   Modules accepted: Orders

## 2016-03-11 NOTE — Telephone Encounter (Signed)
I called pt and advised him that Dr. Brett Fairy will not treat chronic insomnia. However, she did approve his klonopin refill. Pt asked me to send the RX to [facility] on [facility] [address] [address] [facility] [address].

## 2016-03-11 NOTE — Telephone Encounter (Signed)
Please address pt's request for klonopin. Further documentation is available in this phone message for your review. Pt has one refill left on his klonopin but wants you to take over prescribing the klonopin since he uses it as a sleep aid and has experienced a metallic taste in his mouth after taking lunesta.

## 2016-03-11 NOTE — Addendum Note (Signed)
Addended by: [name] on: 03/11/2016 04:06 PM   Modules accepted: Orders

## 2016-03-11 NOTE — Telephone Encounter (Signed)
RX for [name] by Dr. Brett Fairy was shredded since she will not be taking over this RX.

## 2016-03-18 DIAGNOSIS — M25552 Pain in left hip: Secondary | ICD-10-CM | POA: Diagnosis not present

## 2016-03-21 DIAGNOSIS — M5416 Radiculopathy, lumbar region: Secondary | ICD-10-CM | POA: Diagnosis not present

## 2016-04-01 DIAGNOSIS — M5416 Radiculopathy, lumbar region: Secondary | ICD-10-CM | POA: Diagnosis not present

## 2016-04-15 ENCOUNTER — Encounter: Payer: Self-pay | Admitting: Neurology

## 2016-04-15 ENCOUNTER — Ambulatory Visit (INDEPENDENT_AMBULATORY_CARE_PROVIDER_SITE_OTHER): Payer: Medicare Other | Admitting: Neurology

## 2016-04-15 VITALS — BP 100/54 | HR 72 | Resp 16 | Ht 69.5 in | Wt 220.0 lb

## 2016-04-15 DIAGNOSIS — G4733 Obstructive sleep apnea (adult) (pediatric): Secondary | ICD-10-CM | POA: Diagnosis not present

## 2016-04-15 DIAGNOSIS — Z9989 Dependence on other enabling machines and devices: Secondary | ICD-10-CM

## 2016-04-15 DIAGNOSIS — G4701 Insomnia due to medical condition: Secondary | ICD-10-CM | POA: Diagnosis not present

## 2016-04-15 DIAGNOSIS — G8929 Other chronic pain: Secondary | ICD-10-CM | POA: Diagnosis not present

## 2016-04-15 DIAGNOSIS — R0683 Snoring: Secondary | ICD-10-CM

## 2016-04-15 DIAGNOSIS — I2510 Atherosclerotic heart disease of native coronary artery without angina pectoris: Secondary | ICD-10-CM

## 2016-04-15 MED ORDER — ZOLPIDEM TARTRATE 10 MG PO TABS: 10.0000 mg | ORAL_TABLET | Freq: Every evening | ORAL | 2 refills | Status: DC | PRN

## 2016-04-15 NOTE — Patient Instructions (Signed)
Zolpidem tablets What is this medicine? ZOLPIDEM (zole PI dem) is used to treat insomnia. This medicine helps you to fall asleep and sleep through the night. This medicine may be used for other purposes; ask your health care provider or pharmacist if you have questions. COMMON BRAND NAME(S): Ambien What should I tell my health care provider before I take this medicine? They need to know if you have any of these conditions: -depression -history of drug abuse or addiction -if you often drink alcohol -liver disease -lung or breathing disease -myasthenia gravis -sleep apnea -suicidal thoughts, plans, or attempt; a previous suicide attempt by you or a family member -an unusual or allergic reaction to zolpidem, other medicines, foods, dyes, or preservatives -pregnant or trying to get pregnant -breast-feeding How should I use this medicine? Take this medicine by mouth with a glass of water. Follow the directions on the prescription label. It is better to take this medicine on an empty stomach and only when you are ready for bed. Do not take your medicine more often than directed. If you have been taking this medicine for several weeks and suddenly stop taking it, you may get unpleasant withdrawal symptoms. Your doctor or health care professional may want to gradually reduce the dose. Do not stop taking this medicine on your own. Always follow your doctor or health care professional's advice. A special MedGuide will be given to you by the pharmacist with each prescription and refill. Be sure to read this information carefully each time. Talk to your pediatrician regarding the use of this medicine in children. Special care may be needed. Overdosage: If you think you have taken too much of this medicine contact a poison control center or emergency room at once. NOTE: This medicine is only for you. Do not share this medicine with others. What if I miss a dose? This does not apply. This medicine should  only be taken immediately before going to sleep. Do not take double or extra doses. What may interact with this medicine? -alcohol -antihistamines for allergy, cough and cold -certain medicines for anxiety or sleep -certain medicines for depression, like amitriptyline, fluoxetine, sertraline -certain medicines for fungal infections like ketoconazole and itraconazole -certain medicines for seizures like phenobarbital, primidone -ciprofloxacin -dietary supplements for sleep, like valerian or kava kava -general anesthetics like halothane, isoflurane, methoxyflurane, propofol -local anesthetics like lidocaine, pramoxine, tetracaine -medicines that relax muscles for surgery -narcotic medicines for pain -phenothiazines like chlorpromazine, mesoridazine, prochlorperazine, thioridazine -rifampin This list may not describe all possible interactions. Give your health care provider a list of all the medicines, herbs, non-prescription drugs, or dietary supplements you use. Also tell them if you smoke, drink alcohol, or use illegal drugs. Some items may interact with your medicine. What should I watch for while using this medicine? Visit your doctor or health care professional for regular checks on your progress. Keep a regular sleep schedule by going to bed at about the same time each night. Avoid caffeine-containing drinks in the evening hours. When sleep medicines are used every night for more than a few weeks, they may stop working. Talk to your doctor if you still have trouble sleeping. After taking this medicine for sleep, you may get up out of bed while not being fully awake and do an activity that you do not know you are doing. The next morning, you may have no memory of the event. Activities such as driving a car ("sleep-driving"), making and eating food, talking on the phone, sexual activity,   and sleep-walking have been reported. Call your doctor right away if you find out you have done any of these  activities. Do not take this medicine if you have used alcohol that evening or before bed or taken another medicine for sleep since your risk of doing these sleep-related activities will be increased. Wait for at least 8 hours after you take a dose before driving or doing other activities that require full mental alertness. Do not take this medicine unless you are able to stay in bed for a full night (7 to 8 hours) before you must be active again. You may have a decrease in mental alertness the day after use, even if you feel that you are fully awake. Tell your doctor if you will need to perform activities requiring full alertness, such as driving, the next day. Do not stand or sit up quickly after taking this medicine, especially if you are an older patient. This reduces the risk of dizzy or fainting spells. If you or your family notice any changes in your behavior, such as new or worsening depression, thoughts of harming yourself, anxiety, other unusual or disturbing thoughts, or memory loss, call your doctor right away. After you stop taking this medicine, you may have trouble falling asleep. This is called rebound insomnia. This problem usually goes away on its own after 1 or 2 nights. What side effects may I notice from receiving this medicine? Side effects that you should report to your doctor or health care professional as soon as possible: -allergic reactions like skin rash, itching or hives, swelling of the face, lips, or tongue -breathing problems -changes in vision -confusion -depressed mood or other changes in moods or emotions -feeling faint or lightheaded, falls -hallucinations -loss of balance or coordination -loss of memory -numbness or tingling of the tongue -restlessness, excitability, or feelings of anxiety or agitation -signs and symptoms of liver injury like dark yellow or brown urine; general ill feeling or flu-like symptoms; light-colored stools; loss of appetite; nausea;  right upper belly pain; unusually weak or tired; yellowing of the eyes or skin -suicidal thoughts -unusual activities while asleep like driving, eating, making phone calls, or sexual activity Side effects that usually do not require medical attention (report to your doctor or health care professional if they continue or are bothersome): -dizziness -drowsiness the day after you take this medicine -headache This list may not describe all possible side effects. Call your doctor for medical advice about side effects. You may report side effects to FDA at 1-800-FDA-1088. Where should I keep my medicine? Keep out of the reach of children. This medicine can be abused. Keep your medicine in a safe place to protect it from theft. Do not share this medicine with anyone. Selling or giving away this medicine is dangerous and against the law. This medicine may cause accidental overdose and death if taken by other adults, children, or pets. Mix any unused medicine with a substance like cat litter or coffee grounds. Then throw the medicine away in a sealed container like a sealed bag or a coffee can with a lid. Do not use the medicine after the expiration date. Store at room temperature between 20 and 25 degrees C (68 and 77 degrees F). NOTE: This sheet is a summary. It may not cover all possible information. If you have questions about this medicine, talk to your doctor, pharmacist, or health care provider.  2018 Elsevier/Gold Standard (2015-03-28 14:38:20)  

## 2016-04-15 NOTE — Progress Notes (Signed)
[facility]   Provider:  [name], M D  Referring Provider: [name] Pinto, MD Primary Care Physician:  Alesia Richards, MD  Chief Complaint  Bryan [name] presents with  . Follow-up    Rm 10. Bryan [name] states that Bryan Bryan [name] needs new CPAP supplies. Bryan Bryan [name] feels that Bryan pressure is perfect. Has trouble sleeping at night.     HPI:  Mr Bryan Bryan [name] returns today, after Bryan Bryan [name]  had spent  several vacations also in [place], [name]  Bryan Bryan [name] went  [place] when Bryan Bryan [name] needed a wheelchair to get around !  Mr. Bryan Bryan [name] presents today for his first revisit after a sleep study from 01/10/2016.His sleep study showed moderate sleep apnea with an AHI of 21.6 per hour, REM sleep had not been recorded but his supine positional sleep accentuated Bryan apneas too 49.6 per hour. Avoiding Bryan supine position was one of Bryan recommendations along side with CPAP use. Bryan Bryan [name] returned for CPAP titration on 02/05/2016 and was titrated to 11 cm water pressure with a reduction of Bryan AHI to 0.0 and was significant REM sleep rebound. Bryan Bryan [name] had some periodic limb movements but few arousals at 1.2 per hour, and his heart rate stayed in normal sinus rhythm. Sleep efficiency was 92% Bryan Bryan [name] did not have oxygen desaturations of significance.  In Bryan meantime Bryan Bryan [name] has been using CPAP at 11 cm water pressure but his compliance has been poor Bryan Bryan [name] has only use it for 47% of Bryan time for over 4 hours with an average user time of 3 hours and 1 minute. His AHI is significantly reduced when Bryan Bryan [name] uses Bryan machine at 1.3 per hour. Over Bryan last week Bryan Bryan [name] has made a concerted effort to use CPAP at 5 hours and 15 minutes daily,  Bryan Bryan [name] has undergone hip and nerve-root injections, with severe pain being only gradually relieved Bryan Bryan [name] sleeps often in a recliner. Since early April Bryan Bryan [name] had finally enough pain control and is less insomnic. Bryan Bryan [name] explained that Bryan Bryan [name] does only take tylenol 3 when needed, less than twice a week.  His left leg still jerks  a lot when in bed or reclined. Bryan Bryan [name] has noted a right hand tremor.     Bryan Bryan [name] is a 66 y.o. male , seen here as a referral from Dr. Melford Aase for snoring, witnessed apnea and needs an evaluation. Last week Bryan Bryan [name] was in [place] , [place], and didn't snore. His wife was amazed. Bryan Bryan [name] was skiing every day and slept well. Over Bryan last couple of years his snoring has become louder, to a level where his wife often has left Bryan marital bedroom. His past medical history and medical history is positive for coronary artery disease in Bryan native coronary artery, an 80% stenosis of Bryan distal LAD, 99% stenosis proximal and. A stent was placed. Bryan Bryan [name] also has diverticulosis, gastroesophageal reflux disease, hernia, Bryan Bryan [name] had supraventricular tachycardia and was treated with an ablation in 2004, hyperlipidemia, hypertension, symptomatic palpitations, PVCs, vitamin D deficiency. I reviewed his medication; Bryan Bryan [name] has silenor/ doxepin  available at bedtime as a sleep aid, takes Zoloft, Crestor, Zofran in case of nausea, lisinopril, gabapentin 3 times a day Zetia by systolic, Plavix, Flexeril 10 mg for muscle spasms as needed. Bryan Bryan [name] has noted a very dry mouth and his dentist noted a dry mouth as well, likely related to doxepin. lunesta failed- metal taste. Best on klonopin. Sleep habits are as follows: Bryan Bryan [name] is usually in bed by 10 PM but struggles  to fall asleep without medication. Bryan Bryan [name] is taking doxepin. Bryan Bryan [name] sometimes has myoclonic tics at night. Bryan Bryan [name] averages 4-6 hours of nocturnal sleep, Bryan bedroom is cool, quiet and dark. Bryan Bryan [name] prefers to sleep on his side. 2 pillows for head support usually. Bryan Bryan [name] will have one nocturia, during Bryan night , sometimes has trouble to go back to sleep. Bryan bathroom break is usually between 2 and 3 AM . Bryan Bryan [name] has also 3-4 times a week dreams that Bryan Bryan [name] can recall, and sometimes tries to enact. These wake him up- arm and leg movements.  His dreams are not nightmarish in character and Bryan Bryan [name] usually does not feel  that Bryan Bryan [name] has to defend himself or is under threat  Bryan Bryan [name] wakes up with a very dry mouth. No headaches, rarely palpitations. Sleep medical history and family sleep history:  No tonsillectomy, TBI, and no sinus ENT surgery.    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   Never smoked, caffeine , 2 cups of coffee in AM, sometimes Soda, rare tea.  Drinks 2-4 glasses of wine .   Review of Systems: Out of a complete 14 system review, Bryan Bryan [name] complains of only Bryan following symptoms, and all other reviewed systems are negative.  tinnitus,  Diplopia.  Epworth score 5 , Fatigue severity score 39  , depression score 2/15    Social History   Social History  . Marital status: Married    Spouse name: N/A  . Number of children: 3  . Years of education: N/A   Occupational History  . Not on file.   Social History Main Topics  . Smoking status: Never Smoker  . Smokeless tobacco: Never Used  . Alcohol use Yes     Comment: rarely  . Drug use: No  . Sexual activity: Not on file   Other Topics Concern  . Not on file   Social History Narrative   Drinks 2 cups of coffee a day     Family History  Problem Relation Age of Onset  . Cancer Father     brain  . Colon cancer Mother     malignant polyp of rectosigmoid colon  . Hypothyroidism Mother   . Irritable bowel syndrome Mother     Past Medical History:  Diagnosis Date  . CAD (coronary artery disease), native coronary artery    Cath 04/19/2001  normal Left main, moderate proximal disease, 80% stenosis distal LAD, 99% stenosis proximal OM 1, 60% stenosis mid RCA, 70% stenosis ostial mid PDA  PCI with bare metal stent of OM1 same date 3.5 x 18 mm Multilink    . Diverticulosis   . GERD (gastroesophageal reflux disease)   . Hernia   . History of PSVT    Ablation done in 2004 at [facility]   . Hyperlipidemia   . Hypertension   . Symptomatic PVCs   . Vitamin D deficiency     Past Surgical History:  Procedure  Laterality Date  . ACHILLES TENDON REPAIR     left  . APPENDECTOMY    . CARDIAC ELECTROPHYSIOLOGY MAPPING AND ABLATION      Current Outpatient Prescriptions  Medication Sig Dispense Refill  . BYSTOLIC 10 MG tablet take 1 tablet by mouth once daily 90 tablet 1  . clonazePAM (KLONOPIN) 2 MG tablet take 1/2 to 1 tablet by mouth at bedtime (MAX 5 TABLETS A WEEK) 60 tablet 1  . clopidogrel (PLAVIX) 75 MG tablet take 1 tablet by mouth once daily 90 tablet  1  . ezetimibe (ZETIA) 10 MG tablet take 1 tablet by mouth once daily 90 tablet 1  . lisinopril (PRINIVIL,ZESTRIL) 20 MG tablet take 1 tablet by mouth twice a day 90 tablet 1  . rosuvastatin (CRESTOR) 10 MG tablet take 1 tablet by mouth once daily 90 tablet 1  . sertraline (ZOLOFT) 50 MG tablet take 1 tablet by mouth once daily 90 tablet 1   No current facility-administered medications for this visit.     Allergies as of 04/15/2016 - Review Complete 04/15/2016  Allergen Reaction Noted  . Amoxicillin Hives and Swelling 11/16/2012  . Lunesta [eszopiclone]  11/16/2012  . Penicillins Swelling 12/10/2011    Vitals: BP (!) 100/54   Pulse 72   Resp 16   Ht 5' 9.5" (1.765 m)   Wt 220 lb (99.8 kg)   BMI 32.02 kg/m  Last Weight:  Wt Readings from Last 1 Encounters:  04/15/16 220 lb (99.8 kg)   EXH:BZJI mass index is 32.02 kg/m.     Last Height:   Ht Readings from Last 1 Encounters:  04/15/16 5' 9.5" (1.765 m)    Physical exam:  General: Bryan Bryan [name] is awake, alert and appears not in acute distress. Bryan Bryan [name] is well groomed. Head: Normocephalic, atraumatic. Neck is supple. Mallampati 3.  neck circumference: 17. Nasal airflow congested ,  Dry mouth, Retrognathia is not seen. All natural teeth.  Cardiovascular:  Regular rate and rhythm, without  murmurs or carotid bruit, and without distended neck veins. Respiratory: Lungs are clear to auscultation. Skin:  Facial puffiness,  Trunk: BMI is 32, arthralgic gait.  Neurologic exam  : Bryan Bryan [name] is awake and alert, oriented to place and time.    Cranial nerves: Pupils are equal and briskly reactive to light. No evidence of cataract, Bryan Bryan [name] reports diplopia. Horizontal diplopia. Only in evening.  Extraocular movements  in vertical and horizontal planes intact and without nystagmus. Visual fields by finger perimetry are intact. Hearing to finger rub intact. Facial sensation intact to fine touch. Facial motor strength is symmetric and tongue and uvula move midline. Shoulder shrug was symmetrical.   Motor exam:  Deferred  Sensory:  Deferred  Deep tendon reflexes: in Bryan upper and lower extremities are symmetric and intact. Babinski maneuver response is downgoing.      Assessment:  After physical and neurologic examination, review of laboratory studies,  Personal review of imaging studies, reports of other /same  Imaging studies,   Results of polysomnography/ neurophysiology testing and pre-existing records as far as provided in visit., my assessment is :  Bryan Bryan [name] was advised of Bryan nature of Bryan diagnosed sleep disorder , Bryan treatment options and risks for general a health and wellness arising from not treating Bryan condition.  I spent more than 30 minutes of face to face time with Bryan Bryan [name]. Greater than 50% of time was spent in counseling and coordination of care. We have discussed Bryan diagnosis and differential and I answered Bryan Bryan [name]'s questions.  1) Bryan Bryan [name] has been witnessed to snore and have apnea as per his spouse's report. His sleep study confirmed OSA at AHI 21 and CPAP titration to 11 cm relieved this. It has been his chronic pain that has kept him from sleeping well, but Bryan Bryan [name] has finally received some relief over Bryan last week. Is also reflected in his CPAP compliance which has improved. Overall Bryan last 30 days for poor but I think we should allow him another 60 days to see if Bryan Bryan [name]  can get used to CPAP over 5 hours nightly. I will be happy to help him with  humidity settings or any issues with Bryan interface.  2) Insomnia )  Bryan Bryan [name] has tried [place] for sleep but Bryan Bryan [name] did not like Bryan metallic taste in his mouth. In Bryan past Bryan Bryan [name] has used Klonopin prescribed by Dr. Melford Aase. Bryan PCP does not want to refill. Bryan Bryan [name] has tried Belsomra at 5 or 10 mg- and failed. Failed melatonin. Bryan Bryan [name] has not tried Ambien but would like a trial. I will ask PCP to refer to sleep psychologist and Bryan Bryan [name] ( Bryan Bryan [name] ) will continue with pain management.    His comorbidities, including hypertension, coronary artery disease, hyperlipidemia, and chronic pain - all affect sleep as well as Bryan medications.    3) dry mouth- continued after CPAP with heated humidity was started.    Plan:  Treatment plan and additional workup :  I will offer Bryan Bryan [name] Ambien to help him achieve a higher compliance with CPAP but this cannot be a continuous chronic medication. What I would like him to do is to try to take Ambien 3 or 4 times a week and not more than that it is not habit forming. I will be happy to assist in  a referral to a sleep psychologist for insomnia treatment. I'm very happy to hear that his pain has finally reached a more manageable level. I will follow him for CPAP compliance from here on I like to see him in 6 months and from there on yearly.  Rv with me in 6 month.   Asencion Partridge Sosie Gato MD  04/15/2016   CC: [name] Pinto, [place] [address], [place] [id number]

## 2016-04-28 DIAGNOSIS — M5416 Radiculopathy, lumbar region: Secondary | ICD-10-CM | POA: Diagnosis not present

## 2016-04-29 DIAGNOSIS — M25551 Pain in right hip: Secondary | ICD-10-CM | POA: Diagnosis not present

## 2016-05-04 ENCOUNTER — Other Ambulatory Visit: Payer: Self-pay | Admitting: Internal Medicine

## 2016-05-05 DIAGNOSIS — M7062 Trochanteric bursitis, left hip: Secondary | ICD-10-CM | POA: Diagnosis not present

## 2016-05-05 DIAGNOSIS — M25552 Pain in left hip: Secondary | ICD-10-CM | POA: Diagnosis not present

## 2016-05-06 ENCOUNTER — Other Ambulatory Visit: Payer: Self-pay | Admitting: Internal Medicine

## 2016-05-07 ENCOUNTER — Other Ambulatory Visit: Payer: Self-pay

## 2016-05-07 MED ORDER — NEBIVOLOL HCL 10 MG PO TABS: 10.0000 mg | ORAL_TABLET | Freq: Every day | ORAL | 1 refills | Status: DC

## 2016-05-08 DIAGNOSIS — M25552 Pain in left hip: Secondary | ICD-10-CM | POA: Diagnosis not present

## 2016-05-16 DIAGNOSIS — M7062 Trochanteric bursitis, left hip: Secondary | ICD-10-CM | POA: Diagnosis not present

## 2016-05-16 DIAGNOSIS — M25552 Pain in left hip: Secondary | ICD-10-CM | POA: Diagnosis not present

## 2016-05-20 DIAGNOSIS — M25552 Pain in left hip: Secondary | ICD-10-CM | POA: Diagnosis not present

## 2016-05-20 DIAGNOSIS — M7062 Trochanteric bursitis, left hip: Secondary | ICD-10-CM | POA: Diagnosis not present

## 2016-05-22 DIAGNOSIS — M25552 Pain in left hip: Secondary | ICD-10-CM | POA: Diagnosis not present

## 2016-05-22 DIAGNOSIS — M7062 Trochanteric bursitis, left hip: Secondary | ICD-10-CM | POA: Diagnosis not present

## 2016-05-26 DIAGNOSIS — M7062 Trochanteric bursitis, left hip: Secondary | ICD-10-CM | POA: Diagnosis not present

## 2016-05-26 DIAGNOSIS — M25552 Pain in left hip: Secondary | ICD-10-CM | POA: Diagnosis not present

## 2016-05-30 DIAGNOSIS — M7062 Trochanteric bursitis, left hip: Secondary | ICD-10-CM | POA: Diagnosis not present

## 2016-05-30 DIAGNOSIS — M25552 Pain in left hip: Secondary | ICD-10-CM | POA: Diagnosis not present

## 2016-06-03 ENCOUNTER — Other Ambulatory Visit: Payer: Self-pay | Admitting: Internal Medicine

## 2016-06-03 DIAGNOSIS — M25552 Pain in left hip: Secondary | ICD-10-CM | POA: Diagnosis not present

## 2016-06-05 ENCOUNTER — Encounter: Payer: Self-pay | Admitting: Internal Medicine

## 2016-06-05 ENCOUNTER — Ambulatory Visit (INDEPENDENT_AMBULATORY_CARE_PROVIDER_SITE_OTHER): Payer: Medicare Other | Admitting: Internal Medicine

## 2016-06-05 VITALS — BP 128/84 | HR 84 | Temp 97.5°F | Resp 16 | Ht 69.5 in | Wt 219.2 lb

## 2016-06-05 DIAGNOSIS — I10 Essential (primary) hypertension: Secondary | ICD-10-CM

## 2016-06-05 DIAGNOSIS — E782 Mixed hyperlipidemia: Secondary | ICD-10-CM

## 2016-06-05 DIAGNOSIS — I2510 Atherosclerotic heart disease of native coronary artery without angina pectoris: Secondary | ICD-10-CM

## 2016-06-05 DIAGNOSIS — E559 Vitamin D deficiency, unspecified: Secondary | ICD-10-CM

## 2016-06-05 DIAGNOSIS — R7303 Prediabetes: Secondary | ICD-10-CM

## 2016-06-05 DIAGNOSIS — G4733 Obstructive sleep apnea (adult) (pediatric): Secondary | ICD-10-CM | POA: Diagnosis not present

## 2016-06-05 DIAGNOSIS — Z79899 Other long term (current) drug therapy: Secondary | ICD-10-CM

## 2016-06-05 DIAGNOSIS — D508 Other iron deficiency anemias: Secondary | ICD-10-CM

## 2016-06-05 LAB — CBC WITH DIFFERENTIAL/PLATELET
Basophils Absolute: 0 cells/uL (ref 0–200)
Basophils Relative: 0 %
EOS PCT: 6 %
Eosinophils Absolute: 300 cells/uL (ref 15–500)
HCT: 47.2 % (ref 38.5–50.0)
Hemoglobin: 16.3 g/dL (ref 13.2–17.1)
Lymphocytes Relative: 36 %
Lymphs Abs: 1800 cells/uL (ref 850–3900)
MCH: 30 pg (ref 27.0–33.0)
MCHC: 34.5 g/dL (ref 32.0–36.0)
MCV: 86.9 fL (ref 80.0–100.0)
MONOS PCT: 8 %
MPV: 9.8 fL (ref 7.5–12.5)
Monocytes Absolute: 400 cells/uL (ref 200–950)
NEUTROS ABS: 2500 {cells}/uL (ref 1500–7800)
NEUTROS PCT: 50 %
PLATELETS: 209 10*3/uL (ref 140–400)
RBC: 5.43 MIL/uL (ref 4.20–5.80)
RDW: 12.5 % (ref 11.0–15.0)
WBC: 5 10*3/uL (ref 3.8–10.8)

## 2016-06-05 LAB — BASIC METABOLIC PANEL WITH GFR
BUN: 13 mg/dL (ref 7–25)
CALCIUM: 9.1 mg/dL (ref 8.6–10.3)
CO2: 21 mmol/L (ref 20–31)
CREATININE: 0.98 mg/dL (ref 0.70–1.25)
Chloride: 105 mmol/L (ref 98–110)
GFR, Est Non African American: 80 mL/min (ref >=60)
Glucose, Bld: 97 mg/dL (ref 65–99)
Potassium: 3.9 mmol/L (ref 3.5–5.3)
SODIUM: 139 mmol/L (ref 135–146)

## 2016-06-05 LAB — LIPID PANEL
CHOLESTEROL: 156 mg/dL (ref <200)
HDL: 43 mg/dL (ref >40)
LDL Cholesterol: 64 mg/dL (ref <100)
TRIGLYCERIDES: 243 mg/dL — AB (ref <150)
Total CHOL/HDL Ratio: 3.6 Ratio (ref <5.0)
VLDL: 49 mg/dL — ABNORMAL HIGH (ref <30)

## 2016-06-05 LAB — HEPATIC FUNCTION PANEL
ALT: 33 U/L (ref 9–46)
AST: 26 U/L (ref 10–35)
Albumin: 4.2 g/dL (ref 3.6–5.1)
Alkaline Phosphatase: 49 U/L (ref 40–115)
BILIRUBIN DIRECT: 0.1 mg/dL (ref <=0.2)
BILIRUBIN TOTAL: 0.7 mg/dL (ref 0.2–1.2)
Indirect Bilirubin: 0.6 mg/dL (ref 0.2–1.2)
Total Protein: 6.5 g/dL (ref 6.1–8.1)

## 2016-06-05 LAB — IRON AND TIBC
%SAT: 36 % (ref 15–60)
Iron: 112 ug/dL (ref 50–180)
TIBC: 310 ug/dL (ref 250–425)
UIBC: 198 ug/dL

## 2016-06-05 LAB — MAGNESIUM: MAGNESIUM: 2 mg/dL (ref 1.5–2.5)

## 2016-06-05 LAB — VITAMIN D 25 HYDROXY (VIT D DEFICIENCY, FRACTURES): VIT D 25 HYDROXY: 35 ng/mL (ref 30–100)

## 2016-06-05 LAB — TSH: TSH: 2.23 mIU/L (ref 0.40–4.50)

## 2016-06-05 MED ORDER — LOSARTAN POTASSIUM 100 MG PO TABS: ORAL_TABLET | ORAL | 1 refills | Status: DC

## 2016-06-05 NOTE — Patient Instructions (Signed)

## 2016-06-05 NOTE — Progress Notes (Signed)
This very nice 66 y.o. [name] presents for 6 month follow up with Hypertension, Hyperlipidemia, Pre-Diabetes and Vitamin D Deficiency. Patient has OSA /RLS & has been started on CPAP by Dr Brett Fairy. Other ongoing problems include receiving EDSI's for Cx DDD.     Patient c/o several month hx/o dry hacking tickle cough every night after taking his Lisinopril - suspect for ACEi allergy.      Patient is treated for HTN (1990) & BP has been controlled at home. Today's BP is at goal - 128/84. In 2003 he had an acute NSTEMI undergoing PCA & Stent. Then in 2004 he underwent RFA for pAfib. He had a negative Myoview in 2010.  Patient has had no complaints of any cardiac type chest pain, palpitations, dyspnea/orthopnea/PND, dizziness, claudication, or dependent edema.     Hyperlipidemia is controlled with diet & meds. Patient denies myalgias or other med SE's. Last Lipids were at goal albeit elevated Trig's: Lab Results  Component Value Date   CHOL 151 03/06/2016   HDL 55 03/06/2016   LDLCALC 41 03/06/2016   TRIG 277 (H) 03/06/2016   CHOLHDL 2.7 03/06/2016      Also, the patient has history of Morbid Obesity  (BMI 32) and  Is expectantly screened for PreDiabetes and has had no symptoms of reactive hypoglycemia, diabetic polys, paresthesias or visual blurring.  Last A1c was at goal: Lab Results  Component Value Date   HGBA1C 5.2 11/20/2015      Further, the patient also has history of Vitamin D Deficiency ("26" ib n 2008) and supplements vitamin D without any suspected side-effects. Last vitamin D was very low:  Lab Results  Component Value Date   VD25OH 34 11/20/2015   Current Outpatient Prescriptions on File Prior to Visit  Medication Sig  . BYSTOLIC 10 MG tablet take 1 tablet by mouth once daily  . clonazePAM (KLONOPIN) 2 MG tablet take 1/2 to 1 tablet by mouth at bedtime (MAX 5 TABLETS A WEEK)  . clopidogrel (PLAVIX) 75 MG tablet take 1 tablet by mouth once daily  . ezetimibe (ZETIA) 10 MG  tablet take 1 tablet by mouth once daily  . nebivolol (BYSTOLIC) 10 MG tablet Take 1 tablet (10 mg total) by mouth daily.  . rosuvastatin (CRESTOR) 10 MG tablet take 1 tablet by mouth once daily  . sertraline (ZOLOFT) 50 MG tablet take 1 tablet by mouth once daily  . zolpidem (AMBIEN) 10 MG tablet Take 1 tablet (10 mg total) by mouth at bedtime as needed for sleep.   No current facility-administered medications on file prior to visit.    Allergies  Allergen Reactions  . Amoxicillin Hives and Swelling  . Lunesta [Eszopiclone]     Restless legs  . Penicillins Swelling   PMHx:   Past Medical History:  Diagnosis Date  . CAD (coronary artery disease), native coronary artery    Cath 04/19/2001  normal Left main, moderate proximal disease, 80% stenosis distal LAD, 99% stenosis proximal OM 1, 60% stenosis mid RCA, 70% stenosis ostial mid PDA  PCI with bare metal stent of OM1 same date 3.5 x 18 mm Multilink    . Diverticulosis   . GERD (gastroesophageal reflux disease)   . Hernia   . History of PSVT    Ablation done in 2004 at [facility]   . Hyperlipidemia   . Hypertension   . Symptomatic PVCs   . Vitamin D deficiency    Immunization History  Administered Date(s)  Administered  . Influenza Split 10/13/2013, 10/23/2014  . Influenza,inj,quad, With Preservative 01/13/2013  . Influenza-Unspecified 10/07/2011  . PPD Test 10/13/2013, 10/23/2014  . Pneumococcal Conjugate-13 10/13/2013  . Pneumococcal Polysaccharide-23 10/23/2014  . Td 01/06/2006  . Tdap 03/06/2016  . Zoster 09/22/2011   Past Surgical History:  Procedure Laterality Date  . ACHILLES TENDON REPAIR     left  . APPENDECTOMY    . CARDIAC ELECTROPHYSIOLOGY MAPPING AND ABLATION     FHx:    Reviewed / unchanged  SHx:    Reviewed / unchanged  Systems Review:  Constitutional: Denies fever, chills, wt changes, headaches, insomnia, fatigue, night sweats, change in appetite. Eyes: Denies redness, blurred vision,  diplopia, discharge, itchy, watery eyes.  ENT: Denies discharge, congestion, post nasal drip, epistaxis, sore throat, earache, hearing loss, dental pain, tinnitus, vertigo, sinus pain, snoring.  CV: Denies chest pain, palpitations, irregular heartbeat, syncope, dyspnea, diaphoresis, orthopnea, PND, claudication or edema. Respiratory: denies cough, dyspnea, DOE, pleurisy, hoarseness, laryngitis, wheezing.  Gastrointestinal: Denies dysphagia, odynophagia, heartburn, reflux, water brash, abdominal pain or cramps, nausea, vomiting, bloating, diarrhea, constipation, hematemesis, melena, hematochezia  or hemorrhoids. Genitourinary: Denies dysuria, frequency, urgency, nocturia, hesitancy, discharge, hematuria or flank pain. Musculoskeletal: Denies arthralgias, myalgias, stiffness, jt. swelling, pain, limping or strain/sprain.  Skin: Denies pruritus, rash, hives, warts, acne, eczema or change in skin lesion(s). Neuro: No weakness, tremor, incoordination, spasms, paresthesia or pain. Psychiatric: Denies confusion, memory loss or sensory loss. Endo: Denies change in weight, skin or hair change.  Heme/Lymph: No excessive bleeding, bruising or enlarged lymph nodes.  Physical Exam  BP 128/84   Pulse 84   Temp 97.5 F (36.4 C)   Resp 16   Ht 5' 9.5" (1.765 m)   Wt 219 lb 3.2 oz (99.4 kg)   BMI 31.91 kg/m   Appears well nourished, well groomed  and in no distress.  Eyes: PERRLA, EOMs, conjunctiva no swelling or erythema. Sinuses: No frontal/maxillary tenderness ENT/Mouth: EAC's clear, TM's nl w/o erythema, bulging. Nares clear w/o erythema, swelling, exudates. Oropharynx clear without erythema or exudates. Oral hygiene is good. Tongue normal, non obstructing. Hearing intact.  Neck: Supple. Thyroid nl. Car 2+/2+ without bruits, nodes or JVD. Chest: Respirations nl with BS clear & equal w/o rales, rhonchi, wheezing or stridor.  Cor: Heart sounds normal w/ regular rate and rhythm without sig. murmurs,  gallops, clicks or rubs. Peripheral pulses normal and equal  without edema.  Abdomen: Soft & bowel sounds normal. Non-tender w/o guarding, rebound, hernias, masses or organomegaly.  Lymphatics: Unremarkable.  Musculoskeletal: Full ROM all peripheral extremities, joint stability, 5/5 strength and normal gait.  Skin: Warm, dry without exposed rashes, lesions or ecchymosis apparent.  Neuro: Cranial nerves intact, reflexes equal bilaterally. Sensory-motor testing grossly intact. Tendon reflexes grossly intact.  Pysch: Alert & oriented x 3.  Insight and judgement nl & appropriate. No ideations.  Assessment and Plan:  1. Essential hypertension  - Continue medication, monitor blood pressure at home.  - Continue DASH diet. Reminder to go to the ER if any CP,  SOB, nausea, dizziness, severe HA, changes vision/speech,  left arm numbness and tingling and jaw pain.\  - CBC with Differential/Platelet - BASIC METABOLIC PANEL WITH GFR - Magnesium - TSH  - D/c Lisinopril for suspected allergic rxn (cough) & replace with  - losartan (COZAAR) 100 MG tablet; Take 1/2 to 1 tablet daily for BP & Heart  Dispense: 90 tablet; Refill: 1  2. Hyperlipidemia, mixed  - Continue diet/meds, exercise,& lifestyle modifications.  -  Continue monitor periodic cholesterol/liver & renal functions  - Hepatic function panel - Lipid panel - TSH  3. Prediabetes  - Continue diet, exercise, lifestyle modifications.  - Monitor appropriate labs.  - Hemoglobin A1c - Insulin, random  4. Vitamin D deficiency  - Continue supplementation. - VITAMIN D 25 Hydroxy  5. Coronary artery disease involving native coronary artery of native heart without angina pectoris  - Lipid panel - losartan (COZAAR) 100 MG tablet; Take 1/2 to 1 tablet daily for BP & Heart  Dispense: 90 tablet; Refill: 1  6. OSA (obstructive sleep apnea)   7. Medication management  - CBC with Differential/Platelet - BASIC METABOLIC PANEL WITH  GFR - Hepatic function panel - Magnesium - Lipid panel - TSH - Hemoglobin A1c - Insulin, random - VITAMIN D 25 Hydroxy   8. Other iron deficiency anemia  - Iron and TIBC       Discussed  regular exercise, BP monitoring, weight control to achieve/maintain BMI less than 25 and discussed med and SE's. Recommended labs to assess and monitor clinical status with further disposition pending results of labs. Over 30 minutes of exam, counseling, chart review was performed.

## 2016-06-06 LAB — HEMOGLOBIN A1C
HEMOGLOBIN A1C: 5.2 % (ref <5.7)
Mean Plasma Glucose: 103 mg/dL

## 2016-06-06 LAB — INSULIN, RANDOM: INSULIN: 16.1 u[IU]/mL (ref 2.0–19.6)

## 2016-06-10 DIAGNOSIS — M7062 Trochanteric bursitis, left hip: Secondary | ICD-10-CM | POA: Diagnosis not present

## 2016-06-10 DIAGNOSIS — M25552 Pain in left hip: Secondary | ICD-10-CM | POA: Diagnosis not present

## 2016-06-19 ENCOUNTER — Other Ambulatory Visit: Payer: Self-pay | Admitting: Internal Medicine

## 2016-06-24 DIAGNOSIS — M7062 Trochanteric bursitis, left hip: Secondary | ICD-10-CM | POA: Diagnosis not present

## 2016-07-14 DIAGNOSIS — M7062 Trochanteric bursitis, left hip: Secondary | ICD-10-CM | POA: Diagnosis not present

## 2016-07-14 DIAGNOSIS — M25552 Pain in left hip: Secondary | ICD-10-CM | POA: Diagnosis not present

## 2016-07-22 DIAGNOSIS — M7062 Trochanteric bursitis, left hip: Secondary | ICD-10-CM | POA: Diagnosis not present

## 2016-07-22 DIAGNOSIS — M25552 Pain in left hip: Secondary | ICD-10-CM | POA: Diagnosis not present

## 2016-07-30 DIAGNOSIS — M25552 Pain in left hip: Secondary | ICD-10-CM | POA: Diagnosis not present

## 2016-07-30 DIAGNOSIS — M7062 Trochanteric bursitis, left hip: Secondary | ICD-10-CM | POA: Diagnosis not present

## 2016-08-01 DIAGNOSIS — M7062 Trochanteric bursitis, left hip: Secondary | ICD-10-CM | POA: Diagnosis not present

## 2016-08-01 DIAGNOSIS — M25552 Pain in left hip: Secondary | ICD-10-CM | POA: Diagnosis not present

## 2016-08-06 DIAGNOSIS — L570 Actinic keratosis: Secondary | ICD-10-CM | POA: Diagnosis not present

## 2016-08-06 DIAGNOSIS — B078 Other viral warts: Secondary | ICD-10-CM | POA: Diagnosis not present

## 2016-08-06 DIAGNOSIS — L918 Other hypertrophic disorders of the skin: Secondary | ICD-10-CM | POA: Diagnosis not present

## 2016-08-06 DIAGNOSIS — L814 Other melanin hyperpigmentation: Secondary | ICD-10-CM | POA: Diagnosis not present

## 2016-08-06 DIAGNOSIS — D1801 Hemangioma of skin and subcutaneous tissue: Secondary | ICD-10-CM | POA: Diagnosis not present

## 2016-08-06 DIAGNOSIS — L820 Inflamed seborrheic keratosis: Secondary | ICD-10-CM | POA: Diagnosis not present

## 2016-08-06 DIAGNOSIS — L821 Other seborrheic keratosis: Secondary | ICD-10-CM | POA: Diagnosis not present

## 2016-08-06 DIAGNOSIS — D0461 Carcinoma in situ of skin of right upper limb, including shoulder: Secondary | ICD-10-CM | POA: Diagnosis not present

## 2016-08-19 ENCOUNTER — Encounter: Payer: Self-pay | Admitting: Internal Medicine

## 2016-08-26 ENCOUNTER — Telehealth: Payer: Self-pay | Admitting: Neurology

## 2016-08-26 ENCOUNTER — Other Ambulatory Visit: Payer: Self-pay | Admitting: Neurology

## 2016-08-26 MED ORDER — ZOLPIDEM TARTRATE 10 MG PO TABS: 10.0000 mg | ORAL_TABLET | Freq: Every evening | ORAL | 1 refills | Status: DC | PRN

## 2016-08-26 NOTE — Telephone Encounter (Signed)
Pt states he was working in [place] last week and left his zolpidem (AMBIEN) 10 MG tablet(Expired) he would like to know if he could have a refill called in long enough to cover him until his next appointment which is 10-16-2016, please call into  [facility], [place] [phone] (Phone) [phone] (Fax)   Pt is not requesting a call back

## 2016-08-31 ENCOUNTER — Other Ambulatory Visit: Payer: Self-pay | Admitting: Physician Assistant

## 2016-09-03 NOTE — Progress Notes (Deleted)
3 MONTH FOLLOW UP  Assessment and Plan    Hypertension due to endocrine disorder - continue medications, DASH diet, exercise and monitor at home. Call if greater than 130/80.  -     CBC with Differential/Platelet -     BASIC METABOLIC PANEL WITH GFR -     Hepatic function panel -     TSH  Mixed hyperlipidemia -continue medications, check lipids, decrease fatty foods, increase activity.  -     Lipid panel  Morbid obesity (BMI 30.85)    (HCC) - long discussion about weight loss, diet, and exercise  Prediabetes Discussed general issues about diabetes pathophysiology and management., Educational material distributed., Suggested low cholesterol diet., Encouraged aerobic exercise., Discussed foot care., Reminded to get yearly retinal exam.  Vitamin D deficiency Continue supplement  Medication management -     Magnesium  Over 30 minutes of exam, counseling, chart review and critical decision making was performed  Future Appointments Date Time Provider [place]  09/04/2016 11:15 AM Vicie Mutters, PA-C [facility] None  10/16/2016 9:30 AM Dohmeier, Asencion Partridge, MD [facility] None  12/18/2016 9:00 AM [name] Pinto, MD [facility] None     Subjective:  Bryan [name] is a 66 y.o. male who presents for follow up.   His blood pressure has been controlled at home, today their BP is     He does workout. He denies chest pain, shortness of breath, dizziness. He has not been working out as much due to hip/back pain, has had injections and PT.  He has history of CAD s/p stent 2003, with history of PSVT s/p ablation in 2004, still on plavix and follows with cardio, last Cardiolite 2010.  He is following with neuro for sleep apnea.   He is on cholesterol medication and denies myalgias. His cholesterol is not at goal. The cholesterol last visit was:   Lab Results  Component Value Date   CHOL 156 06/05/2016   HDL 43 06/05/2016   LDLCALC 64 06/05/2016   TRIG 243 (H) 06/05/2016   CHOLHDL 3.6 06/05/2016   Last A1C  Lab Results  Component Value Date   HGBA1C 5.2 06/05/2016   Patient is on Vitamin D supplement, 20,000 IU three days a week. .   Lab Results  Component Value Date   VD25OH 35 06/05/2016     BMI is There is no height or weight on file to calculate BMI., he is working on diet and exercise. He does have OSA and just started on CPAP, has woken up 2-3 nights has taken off mask in the night. Has not been able to tolerate lunesta, wakes up with bad taste in his mouth. Took xanax and Doxepin last night and slept well.  Wt Readings from Last 3 Encounters:  06/05/16 219 lb 3.2 oz (99.4 kg)  04/15/16 220 lb (99.8 kg)  03/06/16 213 lb 9.6 oz (96.9 kg)     Medication Review: Current Outpatient Prescriptions on File Prior to Visit  Medication Sig Dispense Refill  . acetaminophen-codeine (TYLENOL #3) 300-30 MG tablet take 1 tablet by mouth every 12 hours if needed for pain  0  . BYSTOLIC 10 MG tablet take 1 tablet by mouth once daily 90 tablet 1  . clonazePAM (KLONOPIN) 2 MG tablet take 1/2 to 1 tablet by mouth at bedtime (MAX 5 TABLETS A WEEK) 60 tablet 1  . clopidogrel (PLAVIX) 75 MG tablet take 1 tablet by mouth once daily 90 tablet 1  . ezetimibe (ZETIA) 10 MG tablet take  1 tablet by mouth once daily 90 tablet 1  . losartan (COZAAR) 100 MG tablet Take 1/2 to 1 tablet daily for BP & Heart 90 tablet 1  . nebivolol (BYSTOLIC) 10 MG tablet Take 1 tablet (10 mg total) by mouth daily. 90 tablet 1  . rosuvastatin (CRESTOR) 10 MG tablet take 1 tablet by mouth once daily 90 tablet 1  . sertraline (ZOLOFT) 50 MG tablet take 1 tablet by mouth once daily 90 tablet 1  . zolpidem (AMBIEN) 10 MG tablet Take 1 tablet (10 mg total) by mouth at bedtime as needed for sleep. 30 tablet 1   No current facility-administered medications on file prior to visit.     Allergies: Allergies  Allergen Reactions  . Lisinopril Cough    ACEi cough  . Amoxicillin Hives and Swelling  .  Lunesta [Eszopiclone]     Restless legs  . Penicillins Swelling    Current Problems (verified) Patient Active Problem List   Diagnosis Date Noted  . OSA (obstructive sleep apnea) 12/25/2015  . Insomnia 01/10/2015  . BMI 30.85,adult 10/23/2014  . Morbid obesity (BMI 30.85)    ([place]) 10/23/2014  . CKD (chronic kidney disease) stage 2, GFR 60-89 ml/min 06/20/2014  . Symptomatic PVCs   . Prediabetes 06/02/2013  . Medication management 06/02/2013  . CAD (coronary artery disease), native coronary artery   . History of PSVT   . Hyperlipidemia   . Hypertension   . Vitamin D deficiency    Surgical History: reviewed and unchanged Family History: reviewed and unchanged Social History: reviewed and unchanged  Review of Systems  Constitutional: Negative.   HENT: Negative.   Eyes: Negative.   Respiratory: Negative.   Cardiovascular: Negative.   Gastrointestinal: Negative.   Genitourinary: Negative.   Musculoskeletal: Positive for back pain and joint pain. Negative for falls, myalgias and neck pain.  Skin: Negative.   Neurological: Positive for tremors (with intention x 2-3 months). Negative for dizziness, tingling, sensory change, speech change, focal weakness, seizures, loss of consciousness and headaches.  Endo/Heme/Allergies: Negative.   Psychiatric/Behavioral: Negative.      Objective:     There were no vitals filed for this visit. There is no height or weight on file to calculate BMI.  General appearance: alert, no distress, WD/WN, male HEENT: normocephalic, sclerae anicteric, TMs pearly, nares patent, no discharge or erythema, pharynx normal Oral cavity: MMM, no lesions Neck: supple, no lymphadenopathy, no thyromegaly, no masses Heart: RRR, normal S1, S2, no murmurs Lungs: CTA bilaterally, no wheezes, rhonchi, or rales Abdomen: +bs, soft, non tender, non distended, no masses, no hepatomegaly, no splenomegaly Musculoskeletal: nontender, no swelling, no obvious deformity, +  tenderness left greater trochanter, left anterior hip.  Extremities: no edema, no cyanosis, no clubbing Pulses: 2+ symmetric, upper and lower extremities, normal cap refill Neurological: alert, oriented x 3, CN2-12 intact, strength normal upper extremities and lower extremities, sensation normal throughout, DTRs 2+ throughout, no cerebellar signs, gait Normal Psychiatric: normal affect, behavior normal, pleasant       Vicie Mutters, PA-C   09/03/2016

## 2016-09-04 ENCOUNTER — Encounter: Payer: Self-pay | Admitting: Physician Assistant

## 2016-09-04 ENCOUNTER — Ambulatory Visit (INDEPENDENT_AMBULATORY_CARE_PROVIDER_SITE_OTHER): Payer: Medicare Other | Admitting: Physician Assistant

## 2016-09-04 ENCOUNTER — Ambulatory Visit: Payer: Self-pay | Admitting: Physician Assistant

## 2016-09-04 VITALS — BP 126/74 | HR 77 | Temp 97.5°F | Resp 14 | Ht 69.5 in | Wt 214.0 lb

## 2016-09-04 DIAGNOSIS — E782 Mixed hyperlipidemia: Secondary | ICD-10-CM

## 2016-09-04 DIAGNOSIS — Z79899 Other long term (current) drug therapy: Secondary | ICD-10-CM | POA: Diagnosis not present

## 2016-09-04 DIAGNOSIS — E559 Vitamin D deficiency, unspecified: Secondary | ICD-10-CM

## 2016-09-04 DIAGNOSIS — N182 Chronic kidney disease, stage 2 (mild): Secondary | ICD-10-CM | POA: Diagnosis not present

## 2016-09-04 DIAGNOSIS — I152 Hypertension secondary to endocrine disorders: Secondary | ICD-10-CM

## 2016-09-04 DIAGNOSIS — R7303 Prediabetes: Secondary | ICD-10-CM | POA: Diagnosis not present

## 2016-09-04 MED ORDER — FLUOXETINE HCL 20 MG PO CAPS: 20.0000 mg | ORAL_CAPSULE | Freq: Every day | ORAL | 2 refills | Status: DC

## 2016-09-04 NOTE — Progress Notes (Signed)
3 MONTH FOLLOW UP  Assessment and Plan    Hypertension due to endocrine disorder - continue medications, DASH diet, exercise and monitor at home. Call if greater than 130/80.  -     CBC with Differential/Platelet -     BASIC METABOLIC PANEL WITH GFR -     Hepatic function panel -     TSH  Mixed hyperlipidemia -continue medications, check lipids, decrease fatty foods, increase activity.  -     Lipid panel  Morbid obesity (BMI 30.85)    (HCC) - long discussion about weight loss, diet, and exercise  Vitamin D deficiency Continue supplement  Medication management -     Magnesium  Depression/insomnia Will switch zoloft to prozac at night Stressed importance of wearing CPAP and getting back with neuro, especially with insomnia and CAD history Will continue alternating klonopin and ambien 10 for now BUT LONG discussion that we need to find a long term solution for sleep that in NOT benzo, I think CPAP and better anxiety will control will help with this  Over 30 minutes of exam, counseling, chart review and critical decision making was performed  Future Appointments Date Time Provider [place]  10/16/2016 9:30 AM Dohmeier, Asencion Partridge, MD [facility] None  12/18/2016 9:00 AM [name] Pinto, MD [facility] None     Subjective:  Bryan [name] is a 66 y.o. male who presents for follow up.   His blood pressure has been controlled at home, today their BP is BP: 126/74   He does workout. He denies chest pain, shortness of breath, dizziness. He has not been working out as much due to hip/back pain, has had injections and PT.  He has history of CAD s/p stent 2003, with history of PSVT s/p ablation in 2004, still on plavix and follows with cardio, last Cardiolite 2010.  He is following with neuro for sleep apnea. He has tried [place], [employer], Physiological scientist (causes metallic taste), doxepin issues.  Has tried wellbutrin 15 years ago that did not help.  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  Had skin cancer removed 3 weeks ago, SCC, right arm.    He is on cholesterol medication and denies myalgias. His cholesterol is not at goal. The cholesterol last visit was:   Lab Results  Component Value Date   CHOL 156 06/05/2016   HDL 43 06/05/2016   LDLCALC 64 06/05/2016   TRIG 243 (H) 06/05/2016   CHOLHDL 3.6 06/05/2016   Last A1C  Lab Results  Component Value Date   HGBA1C 5.2 06/05/2016   Patient is on Vitamin D supplement, 20,000 IU three days a week. .   Lab Results  Component Value Date   VD25OH 35 06/05/2016     BMI is Body mass index is 31.15 kg/m., he is working on diet and exercise.  Wt Readings from Last 3 Encounters:  09/04/16 214 lb (97.1 kg)  06/05/16 219 lb 3.2 oz (99.4 kg)  04/15/16 220 lb (99.8 kg)     Medication Review: Current Outpatient Prescriptions on File Prior to Visit  Medication Sig Dispense Refill  . BYSTOLIC 10 MG tablet take 1 tablet by mouth once daily 90 tablet 1  . clonazePAM (KLONOPIN) 2 MG tablet take 1/2 to 1 tablet by mouth at bedtime (MAX 5 TABLETS A WEEK) 60 tablet 1  . clopidogrel (PLAVIX) 75 MG tablet take 1 tablet by mouth once daily 90 tablet 1  .  ezetimibe (ZETIA) 10 MG tablet take 1 tablet by mouth once daily 90 tablet 1  . losartan (COZAAR) 100 MG tablet Take 1/2 to 1 tablet daily for BP & Heart 90 tablet 1  . nebivolol (BYSTOLIC) 10 MG tablet Take 1 tablet (10 mg total) by mouth daily. 90 tablet 1  . rosuvastatin (CRESTOR) 10 MG tablet take 1 tablet by mouth once daily 90 tablet 1  . sertraline (ZOLOFT) 50 MG tablet take 1 tablet by mouth once daily 90 tablet 1  . zolpidem (AMBIEN) 10 MG tablet Take 1 tablet (10 mg total) by mouth at bedtime as needed for sleep. 30 tablet 1   No current facility-administered medications on file prior to visit.     Allergies: Allergies  Allergen Reactions  . Lisinopril Cough    ACEi cough  . Amoxicillin Hives and  Swelling  . Lunesta [Eszopiclone]     Restless legs  . Penicillins Swelling    Current Problems (verified) Patient Active Problem List   Diagnosis Date Noted  . OSA (obstructive sleep apnea) 12/25/2015  . Insomnia 01/10/2015  . BMI 30.85,adult 10/23/2014  . Morbid obesity (BMI 30.85)    ([place]) 10/23/2014  . CKD (chronic kidney disease) stage 2, GFR 60-89 ml/min 06/20/2014  . Symptomatic PVCs   . Prediabetes 06/02/2013  . Medication management 06/02/2013  . CAD (coronary artery disease), native coronary artery   . History of PSVT   . Hyperlipidemia   . Hypertension   . Vitamin D deficiency    Surgical History: reviewed and unchanged Family History: reviewed and unchanged Social History: reviewed and unchanged  Review of Systems  Constitutional: Negative.   HENT: Negative.   Eyes: Negative.   Respiratory: Negative.   Cardiovascular: Negative.   Gastrointestinal: Negative.   Genitourinary: Negative.   Musculoskeletal: Positive for back pain and joint pain. Negative for falls, myalgias and neck pain.  Skin: Negative.   Neurological: Negative for dizziness, tingling, tremors, sensory change, speech change, focal weakness, seizures, loss of consciousness and headaches.  Endo/Heme/Allergies: Negative.   Psychiatric/Behavioral: Positive for depression. Negative for hallucinations, memory loss, substance abuse and suicidal ideas. The patient is nervous/anxious. The patient does not have insomnia.      Objective:     Today's Vitals   09/04/16 1441  BP: 126/74  Pulse: 77  Resp: 14  Temp: (!) 97.5 F (36.4 C)  SpO2: 97%  Weight: 214 lb (97.1 kg)  Height: 5' 9.5" (1.765 m)   Body mass index is 31.15 kg/m.  General appearance: alert, no distress, WD/WN, male HEENT: normocephalic, sclerae anicteric, TMs pearly, nares patent, no discharge or erythema, pharynx normal Oral cavity: MMM, no lesions Neck: supple, no lymphadenopathy, no thyromegaly, no masses Heart: RRR,  normal S1, S2, no murmurs Lungs: CTA bilaterally, no wheezes, rhonchi, or rales Abdomen: +bs, soft, non tender, non distended, no masses, no hepatomegaly, no splenomegaly Musculoskeletal: nontender, no swelling, no obvious deformity.  Extremities: no edema, no cyanosis, no clubbing Pulses: 2+ symmetric, upper and lower extremities, normal cap refill Neurological: alert, oriented x 3, CN2-12 intact, strength normal upper extremities and lower extremities, sensation normal throughout, DTRs 2+ throughout, no cerebellar signs, gait Normal Psychiatric: normal affect, behavior normal, pleasant       Vicie Mutters, PA-C   09/04/2016

## 2016-09-04 NOTE — Patient Instructions (Addendum)
New guidelines suggest the benzodiazepines are best short term, with prolonged use they lead to physical and psychological dependence. In addition, evidence suggest that for insomnia the effectiveness wanes in 4 weeks and the risks out weight their benefits. Use of these agents have been associated with dementia, falls, motor vehicle accidents and physical addiction. Decreasing these medication have been proven to show improvements in cognition, alertness, decrease of falls and daytime sedation.   We will start a slow taper, symptoms of withdrawal include, insomnia, anxiety, irritability, sweating and stomach or intestinal symptoms like diarrhea or nausea.   GET ON YOUR CPAP!!   Will need to do trial and error for anti anxiety medications Stop the zoloft and start Prozac 20mg  Every single one give it 1 month to start working if this does not help then please call  and we will swtich to celexa  Continue sleep right now for sleep But we will adjust stuff

## 2016-09-05 ENCOUNTER — Other Ambulatory Visit: Payer: Self-pay | Admitting: Physician Assistant

## 2016-09-05 LAB — CBC WITH DIFFERENTIAL/PLATELET
BASOS ABS: 40 {cells}/uL (ref 0–200)
Basophils Relative: 0.7 %
EOS PCT: 3.7 %
Eosinophils Absolute: 211 cells/uL (ref 15–500)
HEMATOCRIT: 46 % (ref 38.5–50.0)
HEMOGLOBIN: 15.8 g/dL (ref 13.2–17.1)
LYMPHS ABS: 1539 {cells}/uL (ref 850–3900)
MCH: 29 pg (ref 27.0–33.0)
MCHC: 34.3 g/dL (ref 32.0–36.0)
MCV: 84.4 fL (ref 80.0–100.0)
MPV: 10.5 fL (ref 7.5–12.5)
Monocytes Relative: 9.8 %
Neutro Abs: 3352 cells/uL (ref 1500–7800)
Neutrophils Relative %: 58.8 %
Platelets: 220 10*3/uL (ref 140–400)
RBC: 5.45 10*6/uL (ref 4.20–5.80)
RDW: 12.4 % (ref 11.0–15.0)
Total Lymphocyte: 27 %
WBC: 5.7 10*3/uL (ref 3.8–10.8)
WBCMIX: 559 {cells}/uL (ref 200–950)

## 2016-09-05 LAB — HEPATIC FUNCTION PANEL
AG RATIO: 2 (calc) (ref 1.0–2.5)
ALT: 31 U/L (ref 9–46)
AST: 23 U/L (ref 10–35)
Albumin: 4.5 g/dL (ref 3.6–5.1)
Alkaline phosphatase (APISO): 58 U/L (ref 40–115)
BILIRUBIN TOTAL: 0.9 mg/dL (ref 0.2–1.2)
Bilirubin, Direct: 0.1 mg/dL (ref 0.0–0.2)
GLOBULIN: 2.3 g/dL (ref 1.9–3.7)
Indirect Bilirubin: 0.8 mg/dL (calc) (ref 0.2–1.2)
TOTAL PROTEIN: 6.8 g/dL (ref 6.1–8.1)

## 2016-09-05 LAB — BASIC METABOLIC PANEL WITH GFR
BUN: 12 mg/dL (ref 7–25)
CO2: 26 mmol/L (ref 20–32)
CREATININE: 1.15 mg/dL (ref 0.70–1.25)
Calcium: 9.8 mg/dL (ref 8.6–10.3)
Chloride: 104 mmol/L (ref 98–110)
GFR, EST NON AFRICAN AMERICAN: 66 mL/min/{1.73_m2} (ref >=60)
GFR, Est African American: 76 mL/min/{1.73_m2} (ref >=60)
GLUCOSE: 81 mg/dL (ref 65–99)
Potassium: 4.2 mmol/L (ref 3.5–5.3)
SODIUM: 139 mmol/L (ref 135–146)

## 2016-09-05 LAB — LIPID PANEL
CHOL/HDL RATIO: 3.8 (calc) (ref <5.0)
CHOLESTEROL: 156 mg/dL (ref <200)
HDL: 41 mg/dL (ref >40)
LDL CHOLESTEROL (CALC): 76 mg/dL
NON-HDL CHOLESTEROL (CALC): 115 mg/dL (ref <130)
TRIGLYCERIDES: 325 mg/dL — AB (ref <150)

## 2016-09-05 LAB — MAGNESIUM: Magnesium: 2 mg/dL (ref 1.5–2.5)

## 2016-09-05 LAB — TSH: TSH: 0.96 mIU/L (ref 0.40–4.50)

## 2016-09-17 ENCOUNTER — Encounter: Payer: Self-pay | Admitting: Neurology

## 2016-09-17 ENCOUNTER — Other Ambulatory Visit: Payer: Self-pay | Admitting: Internal Medicine

## 2016-10-13 ENCOUNTER — Encounter: Payer: Self-pay | Admitting: Physician Assistant

## 2016-10-13 ENCOUNTER — Other Ambulatory Visit: Payer: Self-pay | Admitting: Physician Assistant

## 2016-10-13 ENCOUNTER — Other Ambulatory Visit: Payer: Self-pay | Admitting: Neurology

## 2016-10-13 MED ORDER — CLONAZEPAM 2 MG PO TABS: ORAL_TABLET | ORAL | 0 refills | Status: DC

## 2016-10-13 NOTE — Progress Notes (Signed)
Klonopin has been called into pharmacy on 10.8.18 by dd

## 2016-10-16 ENCOUNTER — Ambulatory Visit: Payer: Medicare Other | Admitting: Neurology

## 2016-10-22 ENCOUNTER — Other Ambulatory Visit: Payer: Self-pay | Admitting: Neurology

## 2016-10-22 DIAGNOSIS — D1801 Hemangioma of skin and subcutaneous tissue: Secondary | ICD-10-CM | POA: Diagnosis not present

## 2016-10-22 DIAGNOSIS — L570 Actinic keratosis: Secondary | ICD-10-CM | POA: Diagnosis not present

## 2016-10-22 DIAGNOSIS — L814 Other melanin hyperpigmentation: Secondary | ICD-10-CM | POA: Diagnosis not present

## 2016-10-22 DIAGNOSIS — Z85828 Personal history of other malignant neoplasm of skin: Secondary | ICD-10-CM | POA: Diagnosis not present

## 2016-10-22 DIAGNOSIS — L821 Other seborrheic keratosis: Secondary | ICD-10-CM | POA: Diagnosis not present

## 2016-10-22 DIAGNOSIS — B0089 Other herpesviral infection: Secondary | ICD-10-CM | POA: Diagnosis not present

## 2016-10-22 MED ORDER — ZOLPIDEM TARTRATE 10 MG PO TABS: 10.0000 mg | ORAL_TABLET | Freq: Every evening | ORAL | 1 refills | Status: DC | PRN

## 2016-10-27 ENCOUNTER — Encounter: Payer: Self-pay | Admitting: Neurology

## 2016-10-27 ENCOUNTER — Ambulatory Visit (INDEPENDENT_AMBULATORY_CARE_PROVIDER_SITE_OTHER): Payer: Medicare Other | Admitting: Neurology

## 2016-10-27 VITALS — BP 115/72 | HR 59 | Ht 71.0 in | Wt 214.0 lb

## 2016-10-27 DIAGNOSIS — I2510 Atherosclerotic heart disease of native coronary artery without angina pectoris: Secondary | ICD-10-CM | POA: Diagnosis not present

## 2016-10-27 DIAGNOSIS — Z789 Other specified health status: Secondary | ICD-10-CM | POA: Diagnosis not present

## 2016-10-27 DIAGNOSIS — F5104 Psychophysiologic insomnia: Secondary | ICD-10-CM | POA: Diagnosis not present

## 2016-10-27 DIAGNOSIS — G4733 Obstructive sleep apnea (adult) (pediatric): Secondary | ICD-10-CM | POA: Diagnosis not present

## 2016-10-27 NOTE — Patient Instructions (Signed)
Sleep hygiene implemented,.Ambien Prn.  Tennis ball methode Dental referral for OSA treatment.  Rv in 3-6 month after dentist visit.

## 2016-10-27 NOTE — Progress Notes (Signed)
SLEEP MED[facility]INE CLINIC   Provider:  [name], M D  Referring Provider: [name] Pinto, MD Primary Care Physician:  [name] Pinto, MD  Chief Complaint  Patient presents with  . Follow-up    pt alone, rm 10. pt states that he is struggling with the CPAP despite using Ambien to help him sleep.     HPI:  I have the pleasure of seeing Bryan [name] today on 10/27/2016, he reports he has struggled with CPAP use in spite of trying Klonopin, Lunesta and even Ambien to help him overcome the insomnia that he developed. His first week compliant with CPAP was rather easy but something changed and he would lay awake for an hour before initiating sleep. The CPAP setting of 11 cm water with 3 cm EPR his residual AHI is excellent at only 2.6 apneas per hour but he only used the machine for 4 out of the last 30 days because of the bothersome problem of sleep initiation. He feels tired and fatigued during the day but he has also name several stressors that have culminated over the last 6 month and may be the cause of his insomnia. I would like for him to try an SSRi, but he just started on Zoloft. He wakes with a puffy uvula and parched mouth.  He may be a good candidate for a dental device/    Bryan [name] returns today, after he  had spent several vacations also in [place], [name]  He went to [place] in January when he needed a wheelchair to get around !  Bryan [name] presents today for his first revisit after a sleep study from 01/10/2016.His sleep study showed moderate sleep apnea with an AHI of 21.6 per hour, REM sleep had not been recorded but his supine positional sleep accentuated the apneas too 49.6 per hour. Avoiding the supine position was one of the recommendations along side with CPAP use. He returned for CPAP titration on 02/05/2016 and was titrated to 11 cm water pressure with a reduction of the AHI to 0.0 and was significant REM sleep rebound. He had some periodic  limb movements but few arousals at 1.2 per hour, and his heart rate stayed in normal sinus rhythm. Sleep efficiency was 92% he did not have oxygen desaturations of significance.  In the meantime he has been using CPAP at 11 cm water pressure but his compliance has been poor he has only use it for 47% of the time for over 4 hours with an average user time of 3 hours and 1 minute. His AHI is significantly reduced when he uses the machine at 1.3 per hour. Over the last week he has made a concerted effort to use CPAP at 5 hours and 15 minutes daily,  He has undergone hip and nerve-root injections, with severe pain being only gradually relieved he sleeps often in a recliner. Since early April he had finally enough pain control and is less insomnic. He explained that he does only take tylenol 3 when needed, less than twice a week.  His left leg still jerks a lot when in bed or reclined. He has noted a right hand tremor.     [name] is a 66 y.o. male , seen here as a referral from Dr. Melford Aase for snoring, witnessed apnea and needs an evaluation. Last week he was in [place] , [place], and didn't snore. His wife was amazed. He was skiing every day and slept well.  Over the last couple of years his snoring has become louder, to a level where his wife often has left the marital bedroom. His past medical history and medical history is positive for coronary artery disease in the native coronary artery, an 80% stenosis of the distal LAD, 99% stenosis proximal and. A stent was placed. He also has diverticulosis, gastroesophageal reflux disease, hernia, he had supraventricular tachycardia and was treated with an ablation in 2004, hyperlipidemia, hypertension, symptomatic palpitations, PVCs, vitamin D deficiency. I reviewed his medication; he has silenor/ doxepin  available at bedtime as a sleep aid, takes Zoloft, Crestor, Zofran in case of nausea, lisinopril, gabapentin 3 times a day Zetia by systolic, Plavix,  Flexeril 10 mg for muscle spasms as needed. He has noted a very dry mouth and his dentist noted a dry mouth as well, likely related to doxepin. lunesta failed- metal taste. Best on klonopin. Sleep habits are as follows: The patient is usually in bed by 10 PM but struggles to fall asleep without medication. He is taking doxepin. He sometimes has myoclonic tics at night. He averages 4-6 hours of nocturnal sleep, the bedroom is cool, quiet and dark. He prefers to sleep on his side. 2 pillows for head support usually. He will have one nocturia, during the night , sometimes has trouble to go back to sleep. The bathroom break is usually between 2 and 3 AM . He has also 3-4 times a week dreams that he can recall, and sometimes tries to enact. These wake him up- arm and leg movements.  His dreams are not nightmarish in character and he usually does not feel that he has to defend himself or is under threat  He wakes up with a very dry mouth. No headaches, rarely palpitations. Sleep medical history and family sleep history:  No tonsillectomy, TBI, and no sinus ENT surgery.    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   Never smoked, caffeine , 2 cups of coffee in AM, sometimes Soda, rare tea.  Drinks 2-4 glasses of wine .   Review of Systems: Out of a complete 14 system review, the patient complains of only the following symptoms, and all other reviewed systems are negative.  tinnitus,  Diplopia.  Epworth score 5 now 3 , Fatigue severity score 39 up to 51 now   , depression score 2/15 last visit  , now 6/15    Social History   Social History  . Marital status: Married    Spouse name: N/A  . Number of children: 3  . Years of education: N/A   Occupational History  . Not on file.   Social History Main Topics  . Smoking status: Never Smoker  . Smokeless tobacco: Never Used  . Alcohol use Yes     Comment: rarely  . Drug use: No  . Sexual activity: Not on file   Other Topics  Concern  . Not on file   Social History Narrative   Drinks 2 cups of coffee a day     Family History  Problem Relation Age of Onset  . Cancer Father        brain  . Colon cancer Mother        malignant polyp of rectosigmoid colon  . Hypothyroidism Mother   . Irritable bowel syndrome Mother     Past Medical History:  Diagnosis Date  . CAD (coronary artery disease), native coronary artery    Cath 04/19/2001  normal Left main, moderate  proximal disease, 80% stenosis distal LAD, 99% stenosis proximal OM 1, 60% stenosis mid RCA, 70% stenosis ostial mid PDA  PCI with bare metal stent of OM1 same date 3.5 x 18 mm Multilink    . Diverticulosis   . GERD (gastroesophageal reflux disease)   . Hernia   . History of PSVT    Ablation done in 2004 at [facility]   . Hyperlipidemia   . Hypertension   . Symptomatic PVCs   . Vitamin D deficiency     Past Surgical History:  Procedure Laterality Date  . ACHILLES TENDON REPAIR     left  . APPENDECTOMY    . CARDIAC ELECTROPHYSIOLOGY MAPPING AND ABLATION      Current Outpatient Prescriptions  Medication Sig Dispense Refill  . BYSTOLIC 10 MG tablet take 1 tablet by mouth once daily 90 tablet 1  . clopidogrel (PLAVIX) 75 MG tablet take 1 tablet by mouth once daily 90 tablet 1  . ezetimibe (ZETIA) 10 MG tablet take 1 tablet by mouth once daily 90 tablet 1  . losartan (COZAAR) 100 MG tablet Take 1/2 to 1 tablet daily for BP & Heart 90 tablet 1  . nebivolol (BYSTOLIC) 10 MG tablet Take 1 tablet (10 mg total) by mouth daily. 90 tablet 1  . zolpidem (AMBIEN) 10 MG tablet Take 1 tablet (10 mg total) by mouth at bedtime as needed for sleep. 30 tablet 1   No current facility-administered medications for this visit.     Allergies as of 10/27/2016 - Review Complete 10/27/2016  Allergen Reaction Noted  . Lisinopril Cough 06/05/2016  . Amoxicillin Hives and Swelling 11/16/2012  . Lunesta [eszopiclone]  11/16/2012  . Penicillins Swelling  12/10/2011    Vitals: BP 115/72   Pulse (!) 59   Ht 5\' 11"  (1.803 m)   Wt 214 lb (97.1 kg)   BMI 29.85 kg/m  Last Weight:  Wt Readings from Last 1 Encounters:  10/27/16 214 lb (97.1 kg)   IZT:IWPY mass index is 29.85 kg/m.     Last Height:   Ht Readings from Last 1 Encounters:  10/27/16 5\' 11"  (1.803 m)    Physical exam:  General: The patient is awake, alert and appears not in acute distress. The patient is well groomed. Head: Normocephalic, atraumatic. Neck is supple. Mallampati 3.  neck circumference: 17. Nasal airflow congested and present with a dry mouth. All natural teeth.  Cardiovascular:  Regular rate and rhythm, without  murmurs or carotid bruit, and without distended neck veins. Respiratory: Lungs are clear to auscultation. Skin:  Facial puffiness,  Trunk: BMI is 32, arthralgic gait.  Neurologic exam :The patient is awake and alert, oriented to place and time.    Cranial nerves: Pupils are equal and briskly reactive to light. No evidence of cataract, he reports diplopia. Horizontal diplopia. Only in evening.  Extraocular movements  in vertical and horizontal planes intact and without nystagmus. Visual fields by finger perimetry are intact. Hearing to finger rub intact. Facial sensation intact to fine touch. Facial motor strength is symmetric and tongue and uvula move midline. Shoulder shrug was symmetrical. Motor exam:  Deferred Sensory:  Deferred Deep tendon reflexes: in the upper and lower extremities are symmetric and intact. Babinski maneuver response is downgoing.     Assessment:  After physical and neurologic examination, review of laboratory studies,  Personal review of imaging studies, reports of other /same  Imaging studies,   Results of polysomnography/ neurophysiology testing and pre-existing records as far  as provided in visit., my assessment is :  The patient was advised of the nature of the diagnosed sleep disorder , the treatment options and risks for  general a health and wellness arising from not treating the condition.  I spent more than 30 minutes of face to face time with the patient. Greater than 50% of time was spent in counseling and coordination of care. We have discussed the diagnosis and differential and I answered the patient's questions.  1)  His sleep study confirmed OSA at AHI 21 and CPAP titration to 11 cm relieved this. However in today's meeting be established that the patient's insomnia which has been chronic at this point is interfering with his ability to tolerate CPAP. Since his sleep study did not show significant hypoxemia and his AHI was largely supine position dependent I suggested to approaches #1 avoiding the supine sleep position by using a tennis ball. #2 referral to a sleep dentist for a mandibular advancement device. This type of apnea may well respond to a mandibular device alone. I 2) Insomnia - chronic , failed Ambien, Klonopin, Lunesta. I recommend cognitive behavior therapy, possibly some medication. There is several smart phone applications that would play meditative music or give breathing direction.    His comorbidities, including hypertension, coronary artery disease, hyperlipidemia, and chronic pain - all affect sleep as well as the medications.    CPAP will be discontinued   Plan:  Treatment plan and additional workup :RV in 3-6 month after dental device.   [name], M.D.   CC: [name] Pinto, [place] [address] [place], [place] [address]

## 2016-11-25 ENCOUNTER — Other Ambulatory Visit: Payer: Self-pay | Admitting: Internal Medicine

## 2016-11-25 DIAGNOSIS — I2510 Atherosclerotic heart disease of native coronary artery without angina pectoris: Secondary | ICD-10-CM

## 2016-11-25 DIAGNOSIS — I10 Essential (primary) hypertension: Secondary | ICD-10-CM

## 2016-12-08 ENCOUNTER — Other Ambulatory Visit: Payer: Self-pay | Admitting: Physician Assistant

## 2016-12-18 ENCOUNTER — Encounter: Payer: Self-pay | Admitting: Internal Medicine

## 2016-12-21 ENCOUNTER — Other Ambulatory Visit: Payer: Self-pay | Admitting: Internal Medicine

## 2017-01-08 NOTE — Progress Notes (Signed)
[name]   [name] Pinto, M.D.     [name], [name] Bryan [name], Bryan [name]                [name], [name] Telephone [phone] Telefax [phone] Annual  Screening/Preventative Visit  & Comprehensive Evaluation & Examination     This very nice 67 y.o. [name] presents for a Screening/Preventative Visit & comprehensive evaluation and management of multiple medical co-morbidities.  Patient has been followed for HTN, Prediabetes, Hyperlipidemia and Vitamin D Deficiency.     Patient has been followed in the last year by Dr Brett Fairy for OSA and has had intolerance of CPAP. He has reported very poor sleep hygiene w/RLS and failure with multiple Sophoretic agents in the recent & remote past . Dr Brett Fairy has recommended an oral appliance and also cognitive behavioral sleep therapy. Patient presented 30 minutes late for today's 9 am appointment and was lethargic alleging haven taken 1/2 tab of Ambien at 3 am. He reports ongoing poor sleep hygiene and has failed trials on multiple various agents in the past.  Patient is also c/o sx's of Restless Legs at night.     HTN predates since 64. Patient's BP has been controlled at home.  Today's BP is at goal - 124/84.  Patient has hx/o ASCAD with ACS/acute Anterior NSTEMI and PCA w/Stent implantation in 2003 . In 2004 , he had an Ablation for pAfib. Stress Myoview was negative in 2010.  Patient denies any cardiac symptoms as chest pain, palpitations, shortness of breath, dizziness or ankle swelling.     Patient's hyperlipidemia is controlled with diet and medications. Patient denies myalgias or other medication SE's. Last lipids were at goal albeit elevated Trig's: Lab Results  Component Value Date   CHOL 182 01/09/2017   HDL 37 (L) 01/09/2017   LDLCALC 64 06/05/2016   TRIG 596 (H) 01/09/2017   CHOLHDL 4.9 01/09/2017       Patient has Morbid Obesity (Bryan [name] 31+)  and is monitored expectantly for prediabetes and patient denies reactive hypoglycemic symptoms, visual blurring, diabetic polys or paresthesias. Last A1c was Normal at goal: Lab Results  Component Value Date   HGBA1C 5.2 06/05/2016       Finally, patient has history of Vitamin D Deficiency("26"/2008)  and last vitamin D was still very low: Lab Results  Component Value Date   VD25OH 35 06/05/2016   Current Outpatient Medications on File Prior to Visit  Medication Sig  . BYSTOLIC 10 MG tablet take 1 tablet by mouth once daily  . clopidogrel (PLAVIX) 75 MG tablet take 1 tablet by mouth once daily  . ezetimibe (ZETIA) 10 MG tablet take 1 tablet by mouth once daily  . losartan (COZAAR) 100 MG tablet take 1/2 to 1 tablet by mouth once daily for blood pressure and HEART  . nebivolol (BYSTOLIC) 10 MG tablet Take 1 tablet (10 mg total) by mouth daily.  . ondansetron (ZOFRAN) 8 MG tablet take 1/2 to 1 tablet by mouth every 8 hours if needed for nausea  . rosuvastatin (CRESTOR) 10 MG tablet take 1 tablet by mouth once daily  . zolpidem (AMBIEN) 10 MG tablet Take 1 tablet (10 mg total) by mouth at bedtime as needed for sleep.   No current facility-administered medications on file prior to visit.    Allergies  Allergen  Reactions  . Lisinopril Cough    ACEi cough  . Amoxicillin Hives and Swelling  . Lunesta [Eszopiclone]     Restless legs  . Penicillins Swelling   Past Medical History:  Diagnosis Date  . CAD (coronary artery disease), native coronary artery    Cath 04/19/2001  normal Left main, moderate proximal disease, 80% stenosis distal LAD, 99% stenosis proximal OM 1, 60% stenosis mid RCA, 70% stenosis ostial mid PDA  PCI with bare metal stent of OM1 same date 3.5 x 18 mm Multilink    . Diverticulosis   . GERD (gastroesophageal reflux disease)   . Hernia   . History of PSVT    Ablation done in 2004 at [facility]   . Hyperlipidemia   .  Hypertension   . Symptomatic PVCs   . Vitamin D deficiency    Health Maintenance  Topic Date Due  . INFLUENZA VACCINE  08/06/2016  . PNA vac Low Risk Adult (2 of 2 - PPSV23) 10/23/2019  . COLONOSCOPY  12/24/2021  . TETANUS/TDAP  03/07/2026  . Hepatitis C Screening  Completed   Immunization History  Administered Date(s) Administered  . Influenza Split 10/13/2013, 10/23/2014  . Influenza, High Dose Seasonal PF 01/09/2017  . Influenza,inj,quad, With Preservative 01/13/2013  . Influenza-Unspecified 10/07/2011  . PPD Test 10/13/2013, 10/23/2014  . Pneumococcal Conjugate-13 10/13/2013  . Pneumococcal Polysaccharide-23 10/23/2014  . Td 01/06/2006  . Tdap 03/06/2016  . Zoster 09/22/2011   Last Colon -  Past Surgical History:  Procedure Laterality Date  . ACHILLES TENDON REPAIR     left  . APPENDECTOMY    . CARDIAC ELECTROPHYSIOLOGY MAPPING AND ABLATION     Family History  Problem Relation Age of Onset  . Cancer Father        brain  . Colon cancer Mother        malignant polyp of rectosigmoid colon  . Hypothyroidism Mother   . Irritable bowel syndrome Mother    Socioeconomic History  . Marital status: Married    Spouse name: Not on file  . Number of children: 3  Occupational History  . Sales  Tobacco Use  . Smoking status: Never Smoker  . Smokeless tobacco: Never Used  Substance and Sexual Activity  . Alcohol use: Yes    Comment: rarely  . Drug use: No  . Sexual activity: Not on file  Social History Narrative   Drinks 2 cups of coffee a day     ROS Constitutional: Denies fever, chills, weight loss/gain, headaches, insomnia,  night sweats or change in appetite. Does c/o fatigue. Eyes: Denies redness, blurred vision, diplopia, discharge, itchy or watery eyes.  ENT: Denies discharge, congestion, post nasal drip, epistaxis, sore throat, earache, hearing loss, dental pain, Tinnitus, Vertigo, Sinus pain or snoring.  Cardio: Denies chest pain, palpitations, irregular  heartbeat, syncope, dyspnea, diaphoresis, orthopnea, PND, claudication or edema Respiratory: denies cough, dyspnea, DOE, pleurisy, hoarseness, laryngitis or wheezing.  Gastrointestinal: Denies dysphagia, heartburn, reflux, water brash, pain, cramps, nausea, vomiting, bloating, diarrhea, constipation, hematemesis, melena, hematochezia, jaundice or hemorrhoids Genitourinary: Denies dysuria, frequency, urgency, discharge, hematuria or flank pain. Has nocturia x 1-3 and occas  Hesitancy.  Musculoskeletal: Denies arthralgia, myalgia, stiffness, Jt. Swelling, pain, limp or strain/sprain. Denies Falls. Skin: Denies puritis, rash, hives, warts, acne, eczema or change in skin lesion Neuro: No weakness, tremor, incoordination, spasms, paresthesia or pain Psychiatric: Denies confusion, memory loss or sensory loss. Denies Depression. Endocrine: Denies change in weight, skin, hair change, nocturia, and paresthesia,  diabetic polys, visual blurring or hyper / hypo glycemic episodes.  Heme/Lymph: No excessive bleeding, bruising or enlarged lymph nodes.  Physical Exam  BP 124/84   Pulse 80   Temp (!) 97.3 F (36.3 C)   Resp 18   Ht 5' 10.5" (1.791 m)   Wt 219 lb 3.2 oz (99.4 kg)   BMI 31.01 kg/m   General Appearance: Well nourished and well groomed and in no apparent distress.  Eyes: PERRLA, EOMs, conjunctiva no swelling or erythema, normal fundi and vessels. Sinuses: No frontal/maxillary tenderness ENT/Mouth: EACs patent / TMs  nl. Nares clear without erythema, swelling, mucoid exudates. Oral hygiene is good. No erythema, swelling, or exudate. Tongue normal, non-obstructing. Tonsils not swollen or erythematous. Hearing normal.  Neck: Supple, thyroid normal. No bruits, nodes or JVD. Respiratory: Respiratory effort normal.  BS equal and clear bilateral without rales, rhonci, wheezing or stridor. Cardio: Heart sounds are normal with regular rate and rhythm and no murmurs, rubs or gallops. Peripheral  pulses are normal and equal bilaterally without edema. No aortic or femoral bruits. Chest: symmetric with normal excursions and percussion.  Abdomen: Soft, with Nl bowel sounds. Nontender, no guarding, rebound, hernias, masses, or organomegaly.  Lymphatics: Non tender without lymphadenopathy.  Genitourinary: No hernias.Testes nl. DRE - prostate nl for age - smooth & firm w/o nodules. Musculoskeletal: Full ROM all peripheral extremities, joint stability, 5/5 strength, and normal gait. Skin: Warm and dry without rashes, lesions, cyanosis, clubbing or  ecchymosis.  Neuro: Cranial nerves intact, reflexes equal bilaterally. Normal muscle tone, no cerebellar symptoms. Sensation intact.  Pysch: Alert and oriented X 3 with normal affect, insight and judgment appropriate.   Assessment and Plan  1. Annual Preventative/Screening Exam   2. Essential hypertension  - EKG 12-Lead - [place], RETROPERITNL ABD,  LTD - Urinalysis, Routine w reflex microscopic - Microalbumin / creatinine urine ratio - CBC with Differential/Platelet - BASIC METABOLIC PANEL WITH GFR - Magnesium - TSH  3. Hyperlipidemia, mixed  - EKG 12-Lead - [place], RETROPERITNL ABD,  LTD - Hepatic function panel - Lipid panel - TSH  4. Prediabetes  - EKG 12-Lead - [place], RETROPERITNL ABD,  LTD - Hemoglobin A1c - Insulin, random  5. Vitamin D deficiency  - VITAMIN D 25 Hydroxy  6. Abnormal glucose  - Hemoglobin A1c - Insulin, random  7. Coronary artery disease involving native coronary artery of native heart without angina pectoris  - EKG 12-Lead - Lipid panel  8. OSA (obstructive sleep apnea)  - EKG 12-Lead  9. Primary insomnia  -  Suvorexant (BELSOMRA) 10 MG TABS; Take 10 mg by mouth Nightly for 10 days.  Dispense: 10 tablet for free coupon ; Refill: 2 w/ coupons for #10tabs  20 mg  10. CKD (chronic kidney disease) stage 2, GFR 60-89 ml/min  - BASIC METABOLIC PANEL WITH GFR  11. Screening for ischemic heart  disease  - EKG 12-Lead  12. Screening for AAA (aortic abdominal aneurysm)  - [place], RETROPERITNL ABD,  LTD  13. Hypersomnia with sleep apnea   14. Screening for colorectal cancer  - POC Hemoccult Bld/Stl   15. Prostate cancer screening  - PSA  16. Benign localized prostatic hyperplasia with lower urinary tract symptoms (LUTS)  - PSA  17. Iron deficiency anemia, unspecified iron deficiency anemia type  - Iron,Total/Total Iron Binding Cap - Ferritin  18. Need for immunization against influenza  - Flu vaccine HIGH DOSE PF (Fluzone High dose)  19. Erectile dysfunction, unspecified erectile dysfunction type  -  tadalafil (CIALIS) 20 MG tablet; Take 1/2 to 1 tablet every 2 to 3 days as needed for XXXX  Dispense: 120 tablet; Refill: 3  20. Medication management  - Urinalysis, Routine w reflex microscopic - Microalbumin / creatinine urine ratio - CBC with Differential/Platelet - BASIC METABOLIC PANEL WITH GFR - Hepatic function panel - Magnesium - Lipid panel - TSH - Hemoglobin A1c - Insulin, random - VITAMIN D 25 Hydroxy         Patient was counseled in prudent diet, weight control to achieve/maintain BMI less than 25, BP monitoring, regular exercise and medications as discussed.  Discussed med effects and SE's. Routine screening labs and tests as requested with regular follow-up as recommended. Over 40 minutes of exam, counseling, chart review and high complex critical decision making was performed

## 2017-01-08 NOTE — Patient Instructions (Addendum)
Sleep Apnea  Sleep apnea is a condition in which breathing pauses or becomes shallow during sleep. Episodes of sleep apnea usually last 10 seconds or longer, and they may occur as many as 20 times an hour. Sleep apnea disrupts your sleep and keeps your body from getting the rest that it needs. This condition can increase your risk of certain health problems, including:  Heart attack.  Stroke.  Obesity.  Diabetes.  Heart failure.  Irregular heartbeat.  There are three kinds of sleep apnea:  Obstructive sleep apnea. This kind is caused by a blocked or collapsed airway.  Central sleep apnea. This kind happens when the part of the brain that controls breathing does not send the correct signals to the muscles that control breathing.  Mixed sleep apnea. This is a combination of obstructive and central sleep apnea.  What are the causes? The most common cause of this condition is a collapsed or blocked airway. An airway can collapse or become blocked if:  Your throat muscles are abnormally relaxed.  Your tongue and tonsils are larger than normal.  You are overweight.  Your airway is smaller than normal.  What increases the risk? This condition is more likely to develop in people who:  Are overweight.  Smoke.  Have a smaller than normal airway.  Are elderly.  Are male.  Drink alcohol.  Take sedatives or tranquilizers.  Have a family history of sleep apnea.  What are the signs or symptoms? Symptoms of this condition include:  Trouble staying asleep.  Daytime sleepiness and tiredness.  Irritability.  Loud snoring.  Morning headaches.  Trouble concentrating.  Forgetfulness.  Decreased interest in sex.  Unexplained sleepiness.  Mood swings.  Personality changes.  Feelings of depression.  Waking up often during the night to urinate.  Dry mouth.  Sore throat.  How is this diagnosed? This condition may be diagnosed with:  A medical  history.  A physical exam.  A series of tests that are done while you are sleeping (sleep study). These tests are usually done in a sleep lab, but they may also be done at home.  How is this treated? Treatment for this condition aims to restore normal breathing and to ease symptoms during sleep. It may involve managing health issues that can affect breathing, such as high blood pressure or obesity. Treatment may include:  Sleeping on your side.  Using a decongestant if you have nasal congestion.  Avoiding the use of depressants, including alcohol, sedatives, and narcotics.  Losing weight if you are overweight.  Making changes to your diet.  Quitting smoking.  Using a device to open your airway while you sleep, such as: ? An oral appliance. This is a custom-made mouthpiece that shifts your lower jaw forward. ? A continuous positive airway pressure (CPAP) device. This device delivers oxygen to your airway through a mask. ? A nasal expiratory positive airway pressure (EPAP) device. This device has valves that you put into each nostril. ? A bi-level positive airway pressure (BPAP) device. This device delivers oxygen to your airway through a mask.  Surgery if other treatments do not work. During surgery, excess tissue is removed to create a wider airway.  It is important to get treatment for sleep apnea. Without treatment, this condition can lead to:  High blood pressure.  Coronary artery disease.  (Men) An inability to achieve or maintain an erection (impotence).  Reduced thinking abilities.  Follow these instructions at home:  Make any lifestyle changes that your  health care provider recommends.  Eat a healthy, well-balanced diet.  Take over-the-counter and prescription medicines only as told by your health care provider.  Avoid using depressants, including alcohol, sedatives, and narcotics.  Take steps to lose weight if you are overweight.  If you were given a device  to open your airway while you sleep, use it only as told by your health care provider.  Do not use any tobacco products, such as cigarettes, chewing tobacco, and e-cigarettes. If you need help quitting, ask your health care provider.  Keep all follow-up visits as told by your health care provider. This is important. Contact a health care provider if:  The device that you received to open your airway during sleep is uncomfortable or does not seem to be working.  Your symptoms do not improve.  Your symptoms get worse. Get help right away if:  You develop chest pain.  You develop shortness of breath.  You develop discomfort in your back, arms, or stomach.  You have trouble speaking.  You have weakness on one side of your body.  You have drooping in your face.   These symptoms may represent a serious problem that is an emergency. Do not wait to see if the symptoms will go away. Get medical help right away. Call your local emergency services (911 in the [place] Do not drive yourself to the hospital.  ++++++++++++++++++++++++++ Preventive Care for Adults  A healthy lifestyle and preventive care can promote health and wellness. Preventive health guidelines for men include the following key practices:  A routine yearly physical is a good way to check with your health care provider about your health and preventative screening. It is a chance to share any concerns and updates on your health and to receive a thorough exam.  Visit your dentist for a routine exam and preventative care every 6 months. Brush your teeth twice a day and floss once a day. Good oral hygiene prevents tooth decay and gum disease.  The frequency of eye exams is based on your age, health, family medical history, use of contact lenses, and other factors. Follow your health care provider's recommendations for frequency of eye exams.  Eat a healthy diet. Foods such as vegetables, fruits, whole grains, low-fat dairy  products, and lean protein foods contain the nutrients you need without too many calories. Decrease your intake of foods high in solid fats, added sugars, and salt. Eat the right amount of calories for you.Get information about a proper diet from your health care provider, if necessary.  Regular physical exercise is one of the most important things you can do for your health. Most adults should get at least 150 minutes of moderate-intensity exercise (any activity that increases your heart rate and causes you to sweat) each week. In addition, most adults need muscle-strengthening exercises on 2 or more days a week.  Maintain a healthy weight. The body mass index (BMI) is a screening tool to identify possible weight problems. It provides an estimate of body fat based on height and weight. Your health care provider can find your BMI and can help you achieve or maintain a healthy weight.For adults 20 years and older:  A BMI below 18.5 is considered underweight.  A BMI of 18.5 to 24.9 is normal.  A BMI of 25 to 29.9 is considered overweight.  A BMI of 30 and above is considered obese.  Maintain normal blood lipids and cholesterol levels by exercising and minimizing your intake of saturated fat.  Eat a balanced diet with plenty of fruit and vegetables. Blood tests for lipids and cholesterol should begin at age 97 and be repeated every 5 years. If your lipid or cholesterol levels are high, you are over 50, or you are at high risk for heart disease, you may need your cholesterol levels checked more frequently.Ongoing high lipid and cholesterol levels should be treated with medicines if diet and exercise are not working.  If you smoke, find out from your health care provider how to quit. If you do not use tobacco, do not start.  Lung cancer screening is recommended for adults aged 16-80 years who are at high risk for developing lung cancer because of a history of smoking. A yearly low-dose CT scan of the  lungs is recommended for people who have at least a 30-pack-year history of smoking and are a current smoker or have quit within the past 15 years. A pack year of smoking is smoking an average of 1 pack of cigarettes a day for 1 year (for example: 1 pack a day for 30 years or 2 packs a day for 15 years). Yearly screening should continue until the smoker has stopped smoking for at least 15 years. Yearly screening should be stopped for people who develop a health problem that would prevent them from having lung cancer treatment.  If you choose to drink alcohol, do not have more than 2 drinks per day. One drink is considered to be 12 ounces (355 mL) of beer, 5 ounces (148 mL) of wine, or 1.5 ounces (44 mL) of liquor.  Avoid use of street drugs. Do not share needles with anyone. Ask for help if you need support or instructions about stopping the use of drugs.  High blood pressure causes heart disease and increases the risk of stroke. Your blood pressure should be checked at least every 1-2 years. Ongoing high blood pressure should be treated with medicines, if weight loss and exercise are not effective.  If you are 23-41 years old, ask your health care provider if you should take aspirin to prevent heart disease.  Diabetes screening involves taking a blood sample to check your fasting blood sugar level. Testing should be considered at a younger age or be carried out more frequently if you are overweight and have at least 1 risk factor for diabetes.  Colorectal cancer can be detected and often prevented. Most routine colorectal cancer screening begins at the age of 53 and continues through age 38. However, your health care provider may recommend screening at an earlier age if you have risk factors for colon cancer. On a yearly basis, your health care provider may provide home test kits to check for hidden blood in the stool. Use of a small camera at the end of a tube to directly examine the colon  (sigmoidoscopy or colonoscopy) can detect the earliest forms of colorectal cancer. Talk to your health care provider about this at age 96, when routine screening begins. Direct exam of the colon should be repeated every 5-10 years through age 93, unless early forms of precancerous polyps or small growths are found.  Hepatitis C blood testing is recommended for all people born from [birth year] through 1965 and any individual with known risks for hepatitis C.  Screening for abdominal aortic aneurysm (AAA)  by ultrasound is recommended for people who have history of high blood pressure or who are current or former smokers.  Healthy men should  receive prostate-specific antigen (PSA) blood tests as  part of routine cancer screening. Talk with your health care provider about prostate cancer screening.  Testicular cancer screening is  recommended for adult males. Screening includes self-exam, a health care provider exam, and other screening tests. Consult with your health care provider about any symptoms you have or any concerns you have about testicular cancer.  Use sunscreen. Apply sunscreen liberally and repeatedly throughout the day. You should seek shade when your shadow is shorter than you. Protect yourself by wearing long sleeves, pants, a wide-brimmed hat, and sunglasses year round, whenever you are outdoors.  Once a month, do a whole-body skin exam, using a mirror to look at the skin on your back. Tell your health care provider about new moles, moles that have irregular borders, moles that are larger than a pencil eraser, or moles that have changed in shape or color.  Stay current with required vaccines (immunizations).  Influenza vaccine. All adults should be immunized every year.  Tetanus, diphtheria, and acellular pertussis (Td, Tdap) vaccine. An adult who has not previously received Tdap or who does not know his vaccine status should receive 1 dose of Tdap. This initial dose should be followed by  tetanus and diphtheria toxoids (Td) booster doses every 10 years. Adults with an unknown or incomplete history of completing a 3-dose immunization series with Td-containing vaccines should begin or complete a primary immunization series including a Tdap dose. Adults should receive a Td booster every 10 years.  Zoster vaccine. One dose is recommended for adults aged 74 years or older unless certain conditions are present.    PREVNAR - Pneumococcal 13-valent conjugate (PCV13) vaccine. When indicated, a person who is uncertain of his immunization history and has no record of immunization should receive the PCV13 vaccine. An adult aged 1 years or older who has certain medical conditions and has not been previously immunized should receive 1 dose of PCV13 vaccine. This PCV13 should be followed with a dose of pneumococcal polysaccharide (PPSV23) vaccine. The PPSV23 vaccine dose should be obtained at least 8 weeks after the dose of PCV13 vaccine. An adult aged 69 years or older who has certain medical conditions and previously received 1 or more doses of PPSV23 vaccine should receive 1 dose of PCV13. The PCV13 vaccine dose should be obtained 1 or more years after the last PPSV23 vaccine dose.    PNEUMOVAX - Pneumococcal polysaccharide (PPSV23) vaccine. When PCV13 is also indicated, PCV13 should be obtained first. All adults aged 38 years and older should be immunized. An adult younger than age 44 years who has certain medical conditions should be immunized. Any person who resides in a nursing home or long-term care facility should be immunized. An adult smoker should be immunized. People with an immunocompromised condition and certain other conditions should receive both PCV13 and PPSV23 vaccines. People with human immunodeficiency virus (HIV) infection should be immunized as soon as possible after diagnosis. Immunization during chemotherapy or radiation therapy should be avoided. Routine use of PPSV23 vaccine  is not recommended for American Indians, [place] Natives, or people younger than 65 years unless there are medical conditions that require PPSV23 vaccine. When indicated, people who have unknown immunization and have no record of immunization should receive PPSV23 vaccine. One-time revaccination 5 years after the first dose of PPSV23 is recommended for people aged 19-64 years who have chronic kidney failure, nephrotic syndrome, asplenia, or immunocompromised conditions. People who received 1-2 doses of PPSV23 before age 44 years should receive another dose of PPSV23 vaccine at age  65 years or later if at least 5 years have passed since the previous dose. Doses of PPSV23 are not needed for people immunized with PPSV23 at or after age 30 years.    Hepatitis A vaccine. Adults who wish to be protected from this disease, have certain high-risk conditions, work with hepatitis A-infected animals, work in hepatitis A research labs, or travel to or work in countries with a high rate of hepatitis A should be immunized. Adults who were previously unvaccinated and who anticipate close contact with an international adoptee during the first 60 days after arrival in the [place] from a country with a high rate of hepatitis A should be immunized.    Hepatitis B vaccine. Adults should be immunized if they wish to be protected from this disease, have certain high-risk conditions, may be exposed to blood or other infectious body fluids, are household contacts or sex partners of hepatitis B positive people, are clients or workers in certain care facilities, or travel to or work in countries with a high rate of hepatitis B.   Preventive Service / Frequency   Ages 35 and over  Blood pressure check.  Lipid and cholesterol check.  Lung cancer screening. / Every year if you are aged 74-80 years and have a 30-pack-year history of smoking and currently smoke or have quit within the past 15 years. Yearly screening is  stopped once you have quit smoking for at least 15 years or develop a health problem that would prevent you from having lung cancer treatment.  Fecal occult blood test (FOBT) of stool. You may not have to do this test if you get a colonoscopy every 10 years.  Flexible sigmoidoscopy** or colonoscopy.** / Every 5 years for a flexible sigmoidoscopy or every 10 years for a colonoscopy beginning at age 62 and continuing until age 33.  Hepatitis C blood test.** / For all people born from [birth year] through 1965 and any individual with known risks for hepatitis C.  Abdominal aortic aneurysm (AAA) screening./ Screening current or former smokers or have Hypertension.  Skin self-exam. / Monthly.  Influenza vaccine. / Every year.  Tetanus, diphtheria, and acellular pertussis (Tdap/Td) vaccine.** / 1 dose of Td every 10 years.   Zoster vaccine.** / 1 dose for adults aged 57 years or older.         Pneumococcal 13-valent conjugate (PCV13) vaccine.    Pneumococcal polysaccharide (PPSV23) vaccine.     Hepatitis A vaccine.** / Consult your health care provider.  Hepatitis B vaccine.** / Consult your health care provider. Screening for abdominal aortic aneurysm (AAA)  by ultrasound is recommended for people who have history of high blood pressure or who are current or former smokers. ++++++++++ Recommend Adult Low Dose Aspirin or  coated  Aspirin 81 mg daily  To reduce risk of Colon Cancer 20 %,  Skin Cancer 26 % ,  Melanoma 46%  and  Pancreatic cancer 60% ++++++++++++++++++++++ Vitamin D goal  is between 70-100.  Please make sure that you are taking your Vitamin D as directed.  It is very important as a natural anti-inflammatory  helping hair, skin, and nails, as well as reducing stroke and heart attack risk.  It helps your bones and helps with mood. It also decreases numerous cancer risks so please take it as directed.  Low Vit D is associated with a 200-300% higher risk for CANCER  and  200-300% higher risk for HEART   ATTACK  &  STROKE.   .....................................Marland Kitchen  It is also associated with higher death rate at younger ages,  autoimmune diseases like Rheumatoid arthritis, Lupus, Multiple Sclerosis.    Also many other serious conditions, like depression, Alzheimer's Dementia, infertility, muscle aches, fatigue, fibromyalgia - just to name a few. ++++++++++++++++++++++ Recommend the book "The END of DIETING" by Dr Excell Seltzer  & the book "The END of DIABETES " by Dr Excell Seltzer At [email or web address] - get book & Audio CD's    Being diabetic has a  300% increased risk for heart attack, stroke, cancer, and alzheimer- type vascular dementia. It is very important that you work harder with diet by avoiding all foods that are white. Avoid white rice (brown & wild rice is OK), white potatoes (sweetpotatoes in moderation is OK), White bread or wheat bread or anything made out of white flour like bagels, donuts, rolls, buns, biscuits, cakes, pastries, cookies, pizza crust, and pasta (made from white flour & egg whites) - vegetarian pasta or spinach or wheat pasta is OK. Multigrain breads like Arnold's or [employer], or multigrain sandwich thins or flatbreads.  Diet, exercise and weight loss can reverse and cure diabetes in the early stages.  Diet, exercise and weight loss is very important in the control and prevention of complications of diabetes which affects every system in your body, ie. Brain - dementia/stroke, eyes - glaucoma/blindness, heart - heart attack/heart failure, kidneys - dialysis, stomach - gastric paralysis, intestines - malabsorption, nerves - severe painful neuritis, circulation - gangrene & loss of a leg(s), and finally cancer and Alzheimers.    I recommend avoid fried & greasy foods,  sweets/candy, white rice (brown or wild rice or Quinoa is OK), white potatoes (sweet potatoes are OK) - anything made from white flour - bagels, doughnuts, rolls, buns,  biscuits,white and wheat breads, pizza crust and traditional pasta made of white flour & egg white(vegetarian pasta or spinach or wheat pasta is OK).  Multi-grain bread is OK - like multi-grain flat bread or sandwich thins. Avoid alcohol in excess. Exercise is also important.    Eat all the vegetables you want - avoid meat, especially red meat and dairy - especially cheese.  Cheese is the most concentrated form of trans-fats which is the worst thing to clog up our arteries. Veggie cheese is OK which can be found in the fresh produce section at [employer] or [employer] or [employer]  ++++++++++++++++++++++ DASH Eating Plan  DASH stands for "Dietary Approaches to Stop Hypertension."   The DASH eating plan is a healthy eating plan that has been shown to reduce high blood pressure (hypertension). Additional health benefits may include reducing the risk of type 2 diabetes mellitus, heart disease, and stroke. The DASH eating plan may also help with weight loss. WHAT DO I NEED TO KNOW ABOUT THE DASH EATING PLAN? For the DASH eating plan, you will follow these general guidelines:  Choose foods with a percent daily value for sodium of less than 5% (as listed on the food label).  Use salt-free seasonings or herbs instead of table salt or sea salt.  Check with your health care provider or pharmacist before using salt substitutes.  Eat lower-sodium products, often labeled as "lower sodium" or "no salt added."  Eat fresh foods.  Eat more vegetables, fruits, and low-fat dairy products.  Choose whole grains. Look for the word "whole" as the first word in the ingredient list.  Choose fish   Limit sweets, desserts, sugars, and sugary drinks.  Choose heart-healthy fats.  Eat  veggie cheese   Eat more home-cooked food and less restaurant, buffet, and fast food.  Limit fried foods.  Cook foods using methods other than frying.  Limit canned vegetables. If you do use them, rinse them well to  decrease the sodium.  When eating at a restaurant, ask that your food be prepared with less salt, or no salt if possible.                      WHAT FOODS CAN I EAT? Read Dr Fara Olden Fuhrman's books on The End of Dieting & The End of Diabetes  Grains Whole grain or whole wheat bread. Brown rice. Whole grain or whole wheat pasta. Quinoa, bulgur, and whole grain cereals. Low-sodium cereals. Corn or whole wheat flour tortillas. Whole grain cornbread. Whole grain crackers. Low-sodium crackers.  Vegetables Fresh or frozen vegetables (raw, steamed, roasted, or grilled). Low-sodium or reduced-sodium tomato and vegetable juices. Low-sodium or reduced-sodium tomato sauce and paste. Low-sodium or reduced-sodium canned vegetables.   Fruits All fresh, canned (in natural juice), or frozen fruits.  Protein Products  All fish and seafood.  Dried beans, peas, or lentils. Unsalted nuts and seeds. Unsalted canned beans.  Dairy Low-fat dairy products, such as skim or 1% milk, 2% or reduced-fat cheeses, low-fat ricotta or cottage cheese, or plain low-fat yogurt. Low-sodium or reduced-sodium cheeses.  Fats and Oils Tub margarines without trans fats. Light or reduced-fat mayonnaise and salad dressings (reduced sodium). Avocado. Safflower, olive, or canola oils. Natural peanut or almond butter.  Other Unsalted popcorn and pretzels. The items listed above may not be a complete list of recommended foods or beverages. Contact your dietitian for more options.  ++++++++++++++++++++  WHAT FOODS ARE NOT RECOMMENDED? Grains/ White flour or wheat flour White bread. White pasta. White rice. Refined cornbread. Bagels and croissants. Crackers that contain trans fat.  Vegetables  Creamed or fried vegetables. Vegetables in a . Regular canned vegetables. Regular canned tomato sauce and paste. Regular tomato and vegetable juices.  Fruits Dried fruits. Canned fruit in light or heavy syrup. Fruit juice.  Meat and Other  Protein Products Meat in general - RED meat & White meat.  Fatty cuts of meat. Ribs, chicken wings, all processed meats as bacon, sausage, bologna, salami, fatback, hot dogs, bratwurst and packaged luncheon meats.  Dairy Whole or 2% milk, cream, half-and-half, and cream cheese. Whole-fat or sweetened yogurt. Full-fat cheeses or blue cheese. Non-dairy creamers and whipped toppings. Processed cheese, cheese spreads, or cheese curds.  Condiments Onion and garlic salt, seasoned salt, table salt, and sea salt. Canned and packaged gravies. Worcestershire sauce. Tartar sauce. Barbecue sauce. Teriyaki sauce. Soy sauce, including reduced sodium. Steak sauce. Fish sauce. Oyster sauce. Cocktail sauce. Horseradish. Ketchup and mustard. Meat flavorings and tenderizers. Bouillon cubes. Hot sauce. Tabasco sauce. Marinades. Taco seasonings. Relishes.  Fats and Oils Butter, stick margarine, lard, shortening and bacon fat. Coconut, palm kernel, or palm oils. Regular salad dressings.  Pickles and olives. Salted popcorn and pretzels.  The items listed above may not be a complete list of foods and beverages to avoid.

## 2017-01-09 ENCOUNTER — Encounter: Payer: Self-pay | Admitting: Gastroenterology

## 2017-01-09 ENCOUNTER — Ambulatory Visit (INDEPENDENT_AMBULATORY_CARE_PROVIDER_SITE_OTHER): Payer: Medicare Other | Admitting: Internal Medicine

## 2017-01-09 ENCOUNTER — Encounter: Payer: Self-pay | Admitting: Internal Medicine

## 2017-01-09 VITALS — BP 124/84 | HR 80 | Temp 97.3°F | Resp 18 | Ht 70.5 in | Wt 219.2 lb

## 2017-01-09 DIAGNOSIS — Z0001 Encounter for general adult medical examination with abnormal findings: Secondary | ICD-10-CM

## 2017-01-09 DIAGNOSIS — D509 Iron deficiency anemia, unspecified: Secondary | ICD-10-CM | POA: Diagnosis not present

## 2017-01-09 DIAGNOSIS — Z79899 Other long term (current) drug therapy: Secondary | ICD-10-CM

## 2017-01-09 DIAGNOSIS — Z23 Encounter for immunization: Secondary | ICD-10-CM | POA: Diagnosis not present

## 2017-01-09 DIAGNOSIS — I10 Essential (primary) hypertension: Secondary | ICD-10-CM | POA: Diagnosis not present

## 2017-01-09 DIAGNOSIS — Z136 Encounter for screening for cardiovascular disorders: Secondary | ICD-10-CM

## 2017-01-09 DIAGNOSIS — R7303 Prediabetes: Secondary | ICD-10-CM | POA: Diagnosis not present

## 2017-01-09 DIAGNOSIS — R7309 Other abnormal glucose: Secondary | ICD-10-CM

## 2017-01-09 DIAGNOSIS — N401 Enlarged prostate with lower urinary tract symptoms: Secondary | ICD-10-CM

## 2017-01-09 DIAGNOSIS — F5101 Primary insomnia: Secondary | ICD-10-CM

## 2017-01-09 DIAGNOSIS — Z125 Encounter for screening for malignant neoplasm of prostate: Secondary | ICD-10-CM

## 2017-01-09 DIAGNOSIS — E782 Mixed hyperlipidemia: Secondary | ICD-10-CM

## 2017-01-09 DIAGNOSIS — I2510 Atherosclerotic heart disease of native coronary artery without angina pectoris: Secondary | ICD-10-CM

## 2017-01-09 DIAGNOSIS — G4730 Sleep apnea, unspecified: Secondary | ICD-10-CM

## 2017-01-09 DIAGNOSIS — E559 Vitamin D deficiency, unspecified: Secondary | ICD-10-CM | POA: Diagnosis not present

## 2017-01-09 DIAGNOSIS — N529 Male erectile dysfunction, unspecified: Secondary | ICD-10-CM

## 2017-01-09 DIAGNOSIS — G4710 Hypersomnia, unspecified: Secondary | ICD-10-CM

## 2017-01-09 DIAGNOSIS — Z1211 Encounter for screening for malignant neoplasm of colon: Secondary | ICD-10-CM

## 2017-01-09 DIAGNOSIS — N182 Chronic kidney disease, stage 2 (mild): Secondary | ICD-10-CM

## 2017-01-09 DIAGNOSIS — G4733 Obstructive sleep apnea (adult) (pediatric): Secondary | ICD-10-CM

## 2017-01-09 DIAGNOSIS — Z1212 Encounter for screening for malignant neoplasm of rectum: Secondary | ICD-10-CM

## 2017-01-09 MED ORDER — SUVOREXANT 10 MG PO TABS: 10.0000 mg | ORAL_TABLET | Freq: Every evening | ORAL | 2 refills | Status: AC

## 2017-01-09 MED ORDER — TADALAFIL 20 MG PO TABS: ORAL_TABLET | ORAL | 3 refills | Status: DC

## 2017-01-10 LAB — MICROALBUMIN / CREATININE URINE RATIO
Creatinine, Urine: 135 mg/dL (ref 20–320)
MICROALB/CREAT RATIO: 2 ug/mg{creat} (ref <30)
Microalb, Ur: 0.3 mg/dL

## 2017-01-10 LAB — URINALYSIS, ROUTINE W REFLEX MICROSCOPIC
BILIRUBIN URINE: NEGATIVE
Glucose, UA: NEGATIVE
HGB URINE DIPSTICK: NEGATIVE
KETONES UR: NEGATIVE
Leukocytes, UA: NEGATIVE
NITRITE: NEGATIVE
Protein, ur: NEGATIVE
Specific Gravity, Urine: 1.022 (ref 1.001–1.03)
pH: 5.5 (ref 5.0–8.0)

## 2017-01-12 LAB — CBC WITH DIFFERENTIAL/PLATELET
BASOS ABS: 38 {cells}/uL (ref 0–200)
Basophils Relative: 0.7 %
EOS ABS: 243 {cells}/uL (ref 15–500)
Eosinophils Relative: 4.5 %
HCT: 48.5 % (ref 38.5–50.0)
HEMOGLOBIN: 16.5 g/dL (ref 13.2–17.1)
Lymphs Abs: 1517 cells/uL (ref 850–3900)
MCH: 29.1 pg (ref 27.0–33.0)
MCHC: 34 g/dL (ref 32.0–36.0)
MCV: 85.5 fL (ref 80.0–100.0)
MONOS PCT: 8.6 %
MPV: 10.4 fL (ref 7.5–12.5)
NEUTROS ABS: 3137 {cells}/uL (ref 1500–7800)
Neutrophils Relative %: 58.1 %
Platelets: 220 10*3/uL (ref 140–400)
RBC: 5.67 10*6/uL (ref 4.20–5.80)
RDW: 12.3 % (ref 11.0–15.0)
TOTAL LYMPHOCYTE: 28.1 %
WBC mixed population: 464 cells/uL (ref 200–950)
WBC: 5.4 10*3/uL (ref 3.8–10.8)

## 2017-01-12 LAB — HEPATIC FUNCTION PANEL
AG Ratio: 2 (calc) (ref 1.0–2.5)
ALKALINE PHOSPHATASE (APISO): 66 U/L (ref 40–115)
ALT: 33 U/L (ref 9–46)
AST: 24 U/L (ref 10–35)
Albumin: 4.5 g/dL (ref 3.6–5.1)
Bilirubin, Direct: 0.1 mg/dL (ref 0.0–0.2)
Globulin: 2.3 g/dL (calc) (ref 1.9–3.7)
Indirect Bilirubin: 0.5 mg/dL (calc) (ref 0.2–1.2)
TOTAL PROTEIN: 6.8 g/dL (ref 6.1–8.1)
Total Bilirubin: 0.6 mg/dL (ref 0.2–1.2)

## 2017-01-12 LAB — PSA: PSA: 1 ng/mL (ref <=4.0)

## 2017-01-12 LAB — BASIC METABOLIC PANEL WITH GFR
BUN: 15 mg/dL (ref 7–25)
CO2: 29 mmol/L (ref 20–32)
CREATININE: 0.94 mg/dL (ref 0.70–1.25)
Calcium: 9.2 mg/dL (ref 8.6–10.3)
Chloride: 103 mmol/L (ref 98–110)
GFR, Est African American: 98 mL/min/{1.73_m2} (ref >=60)
GFR, Est Non African American: 84 mL/min/{1.73_m2} (ref >=60)
GLUCOSE: 102 mg/dL — AB (ref 65–99)
POTASSIUM: 4.2 mmol/L (ref 3.5–5.3)
SODIUM: 138 mmol/L (ref 135–146)

## 2017-01-12 LAB — LIPID PANEL
CHOLESTEROL: 182 mg/dL (ref <200)
HDL: 37 mg/dL — AB (ref >40)
Non-HDL Cholesterol (Calc): 145 mg/dL (calc) — ABNORMAL HIGH (ref <130)
Total CHOL/HDL Ratio: 4.9 (calc) (ref <5.0)
Triglycerides: 596 mg/dL — ABNORMAL HIGH (ref <150)

## 2017-01-12 LAB — IRON, TOTAL/TOTAL IRON BINDING CAP
%SAT: 32 % (calc) (ref 15–60)
Iron: 102 ug/dL (ref 50–180)
TIBC: 319 mcg/dL (calc) (ref 250–425)

## 2017-01-12 LAB — INSULIN, RANDOM: INSULIN: 31.7 u[IU]/mL — AB (ref 2.0–19.6)

## 2017-01-12 LAB — MAGNESIUM: MAGNESIUM: 2 mg/dL (ref 1.5–2.5)

## 2017-01-12 LAB — HEMOGLOBIN A1C
HEMOGLOBIN A1C: 5.4 %{Hb} (ref <5.7)
Mean Plasma Glucose: 108 (calc)
eAG (mmol/L): 6 (calc)

## 2017-01-12 LAB — TSH: TSH: 1.19 mIU/L (ref 0.40–4.50)

## 2017-01-12 LAB — VITAMIN D 25 HYDROXY (VIT D DEFICIENCY, FRACTURES): Vit D, 25-Hydroxy: 40 ng/mL (ref 30–100)

## 2017-01-12 LAB — FERRITIN: Ferritin: 197 ng/mL (ref 20–380)

## 2017-02-02 ENCOUNTER — Other Ambulatory Visit: Payer: Self-pay | Admitting: Internal Medicine

## 2017-02-02 ENCOUNTER — Other Ambulatory Visit: Payer: Self-pay | Admitting: Neurology

## 2017-02-05 ENCOUNTER — Ambulatory Visit (INDEPENDENT_AMBULATORY_CARE_PROVIDER_SITE_OTHER): Payer: Medicare Other | Admitting: Neurology

## 2017-02-05 ENCOUNTER — Encounter: Payer: Self-pay | Admitting: Neurology

## 2017-02-05 VITALS — BP 137/85 | HR 73 | Ht 70.0 in | Wt 216.0 lb

## 2017-02-05 DIAGNOSIS — G4733 Obstructive sleep apnea (adult) (pediatric): Secondary | ICD-10-CM

## 2017-02-05 DIAGNOSIS — Z463 Encounter for fitting and adjustment of dental prosthetic device: Secondary | ICD-10-CM | POA: Diagnosis not present

## 2017-02-05 DIAGNOSIS — I2510 Atherosclerotic heart disease of native coronary artery without angina pectoris: Secondary | ICD-10-CM | POA: Diagnosis not present

## 2017-02-05 MED ORDER — ZOLPIDEM TARTRATE 10 MG PO TABS: 5.0000 mg | ORAL_TABLET | Freq: Every evening | ORAL | 1 refills | Status: DC | PRN

## 2017-02-05 NOTE — Patient Instructions (Signed)

## 2017-02-05 NOTE — Progress Notes (Signed)
[facility]EP [facility]INE CL[facility]IC   Provider:  [name], M D  Referring Provider: [name] Pinto, MD Primary Care Physician:  [name] Pinto, MD  Chief Complaint  Patient presents with  . Follow-up    rm 11. pt alone, pt started dental device in january and he has had it adjusted.snoring is better. he is having body jerks in sleep.     HPI: Interval history from 05 February 2017.  I have the pleasure of meeting with [name] today who has started using a mandibular advancement device for the treatment of obstructive sleep apnea.  The device has been adjusted through his dentist and his snoring has been reduced.  Has not had a home sleep test yet, but this is usually provided through Dr. Ron Parker office. He still  has a lot of PLMs, movements in sleep reported by spouse.  He endorsed an Epworth sleepiness score at 6 points and fatigue severity was still elevated at 48 points.  But overall he feels that his sleep has been better since he does not struggle with CPAP any longer. Insomnia - Ambien works , usually 3-4 days a week. Marland Kitchen  He owns a CPAP and he still thinks that in some situations he may use it, but he would like a different interface.   I will give him a prescription for the new ResMed nasal pillow mask, the N30 I, but he needs a fitting.    I have the pleasure of seeing [name] today on 10/27/2016, he reports he has struggled with CPAP use in spite of trying Klonopin, Lunesta and even Ambien to help him overcome the insomnia that he developed. His first week compliant with CPAP was rather easy but something changed and he would lay awake for an hour before initiating sleep. The CPAP setting of 11 cm water with 3 cm EPR his residual AHI is excellent at only 2.6 / hour . He only used the machine for 4 out of the last 30 days because of the bothersome problem of sleep initiation. He feels tired and fatigued during the day but he has also name several stressors that have culminated over  the last 6 month and may be the cause of his insomnia. I would like for him to try an SSRi, but he just started on Zoloft. He wakes with a puffy uvula and parched mouth.  He may be a good candidate for a dental device/    [name], [name], [name]  He went to [place] in January when he needed a wheelchair to get around ! [name] presents today for his first revisit after a sleep study from 01/10/2016.His sleep study showed moderate sleep apnea with an AHI of 21.6 per hour, REM sleep had not been recorded but his supine positional sleep accentuated the apneas too 49.6 per hour. Avoiding the supine position was one of the recommendations along side with CPAP use. He returned for CPAP titration on 02/05/2016 and was titrated to 11 cm water pressure with a reduction of the AHI to 0.0 and was significant REM sleep rebound. He had some periodic limb movements but few arousals at 1.2 per hour, and his heart rate stayed in normal sinus rhythm. Sleep efficiency was 92% he did not have oxygen desaturations of significance.  In the meantime he has been using CPAP at 11 cm water pressure but his compliance has been poor he has  only use it for 47% of the time for over 4 hours with an average user time of 3 hours and 1 minute. His AHI is significantly reduced when he uses the machine at 1.3 per hour. Over the last week he has made a concerted effort to use CPAP at 5 hours and 15 minutes daily.  He has undergone hip and nerve-root injections, with severe pain being only gradually relieved he sleeps often in a recliner. Since early April he had finally enough pain control and is less insomnic. He explained that he does only take tylenol 3 when needed, less than twice a week.  His left leg still jerks a lot when in bed or reclined. He has noted a right hand tremor.     [name] is a 67 y.o. male , seen here as a referral  from Dr. Melford Aase for snoring, witnessed apnea and needs an evaluation. Last week he was in [place] , [place], and didn't snore. His wife was amazed. He was skiing every day and slept well. Over the last couple of years his snoring has become louder, to a level where his wife often has left the marital bedroom. His past medical history and medical history is positive for coronary artery disease in the native coronary artery, an 80% stenosis of the distal LAD, 99% stenosis proximal and. A stent was placed. He also has diverticulosis, gastroesophageal reflux disease, hernia, he had supraventricular tachycardia and was treated with an ablation in 2004, hyperlipidemia, hypertension, symptomatic palpitations, PVCs, vitamin D deficiency. I reviewed his medication; he has silenor/ doxepin  available at bedtime as a sleep aid, takes Zoloft, Crestor, Zofran in case of nausea, lisinopril, gabapentin 3 times a day Zetia by systolic, Plavix, Flexeril 10 mg for muscle spasms as needed. He has noted a very dry mouth and his dentist noted a dry mouth as well, likely related to doxepin and Lunesta failed- metal taste. Best on klonopin. Sleep habits are as follows: The patient is usually in bed by 10 PM but struggles to fall asleep without medication. He is taking doxepin. He sometimes has myoclonic tics at night. He averages 4-6 hours of nocturnal sleep, the bedroom is cool, quiet and dark. He prefers to sleep on his side. 2 pillows for head support usually. He will have one nocturia, during the night , sometimes has trouble to go back to sleep. The bathroom break is usually between 2 and 3 AM . He has also 3-4 times a week dreams that he can recall, and sometimes tries to enact. These wake him up- arm and leg movements.  His dreams are not nightmarish in character and he usually does not feel that he has to defend himself or is under threat  He wakes up with a very dry mouth. No headaches, rarely palpitations. Sleep medical  history and family sleep history:  No tonsillectomy, TBI, and no sinus ENT surgery.    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   Never smoked, caffeine , 2 cups of coffee in AM, sometimes Soda, rare tea.  Drinks 2-4 glasses of wine .   Review of Systems: Out of a complete 14 system review, the patient complains of only the following symptoms, and all other reviewed systems are negative.  tinnitus,  Diplopia., PLMs, OSA not longer on CPAP, mandibular device.  Epworth score 6 ,  Fatigue severity score 48  , depression score 2/15 .   Social History   Socioeconomic  History  . Marital status: Married    Spouse name: Not on file  . Number of children: 3  . Years of education: Not on file  . Highest education level: Not on file  Social Needs  . Financial resource strain: Not on file  . Food insecurity - worry: Not on file  . Food insecurity - inability: Not on file  . Transportation needs - medical: Not on file  . Transportation needs - non-medical: Not on file  Occupational History  . Not on file  Tobacco Use  . Smoking status: Never Smoker  . Smokeless tobacco: Never Used  Substance and Sexual Activity  . Alcohol use: Yes    Comment: rarely  . Drug use: No  . Sexual activity: Not on file  Other Topics Concern  . Not on file  Social History Narrative   Drinks 2 cups of coffee a day     Family History  Problem Relation Age of Onset  . Cancer Father        brain  . Colon cancer Mother        malignant polyp of rectosigmoid colon  . Hypothyroidism Mother   . Irritable bowel syndrome Mother     Past Medical History:  Diagnosis Date  . CAD (coronary artery disease), native coronary artery    Cath 04/19/2001  normal Left main, moderate proximal disease, 80% stenosis distal LAD, 99% stenosis proximal OM 1, 60% stenosis mid RCA, 70% stenosis ostial mid PDA  PCI with bare metal stent of OM1 same date 3.5 x 18 mm Multilink    . Diverticulosis   . GERD  (gastroesophageal reflux disease)   . Hernia   . History of PSVT    Ablation done in 2004 at [facility]   . Hyperlipidemia   . Hypertension   . Symptomatic PVCs   . Vitamin D deficiency     Past Surgical History:  Procedure Laterality Date  . ACHILLES TENDON REPAIR     left  . APPENDECTOMY    . CARDIAC ELECTROPHYSIOLOGY MAPPING AND ABLATION      Current Outpatient Medications  Medication Sig Dispense Refill  . BYSTOLIC 10 MG tablet take 1 tablet by mouth once daily 90 tablet 1  . clopidogrel (PLAVIX) 75 MG tablet take 1 tablet by mouth once daily 90 tablet 1  . ezetimibe (ZETIA) 10 MG tablet take 1 tablet by mouth once daily 90 tablet 1  . losartan (COZAAR) 100 MG tablet take 1/2 to 1 tablet by mouth once daily for blood pressure and HEART 90 tablet 1  . nebivolol (BYSTOLIC) 10 MG tablet Take 1 tablet (10 mg total) by mouth daily. 90 tablet 1  . rosuvastatin (CRESTOR) 10 MG tablet take 1 tablet by mouth once daily 90 tablet 1  . sertraline (ZOLOFT) 50 MG tablet take 1 tablet by mouth once daily 90 tablet 1  . tadalafil (CIALIS) 20 MG tablet Take 1/2 to 1 tablet every 2 to 3 days as needed for XXXX 120 tablet 3  . zolpidem (AMBIEN) 10 MG tablet Take 0.5-1 tablets (5-10 mg total) by mouth at bedtime as needed for sleep. 30 tablet 0   No current facility-administered medications for this visit.     Allergies as of 02/05/2017 - Review Complete 02/05/2017  Allergen Reaction Noted  . Lisinopril Cough 06/05/2016  . Amoxicillin Hives and Swelling 11/16/2012  . Lunesta [eszopiclone]  11/16/2012  . Penicillins Swelling 12/10/2011    Vitals: BP 137/85  Pulse 73   Ht 5\' 10"  (1.778 m)   Wt 216 lb (98 kg)   BMI 30.99 kg/m  Last Weight:  Wt Readings from Last 1 Encounters:  02/05/17 216 lb (98 kg)   IOX:BDZH mass index is 30.99 kg/m.     Last Height:   Ht Readings from Last 1 Encounters:  02/05/17 5\' 10"  (1.778 m)    Physical exam:  General: The patient is awake,  alert and appears not in acute distress. The patient is well groomed. Head: Normocephalic, atraumatic. Neck is supple. Mallampati 3.  neck circumference: 17. Nasal airflow congested and present with a dry mouth. All natural teeth. No palor,  Cardiovascular:  Regular rate and rhythm, without  murmurs or carotid bruit, and without distended neck veins. Respiratory: Lungs are clear to auscultation. Skin:  Facial puffiness,, erythema   Trunk: BMI is 31, mildly arthralgic gait.  Neurologic exam : The patient is awake and alert, oriented to place and time.  Good mood.   Cranial nerves: No change of taste and smell. Pupils are equal and briskly reactive to light. No evidence of cataract, he reports diplopia. Horizontal diplopia. Only in evening.  Extraocular movements  in vertical and horizontal planes intact and without nystagmus. Visual fields by finger perimetry are intact. Hearing to finger rub intact. Facial sensation intact to fine touch. Facial motor strength is symmetric and tongue and uvula move midline. Shoulder shrug was symmetrical.  Motor exam:  Deferred Sensory:  Deferred  Deep tendon reflexes: in the upper and lower extremities are symmetric.     Assessment:  After physical and neurologic examination, review of laboratory studies,  Personal review of imaging studies, reports of other /same  Imaging studies,   Results of polysomnography/ neurophysiology testing and pre-existing records as far as provided in visit., my assessment is :  The patient was advised of the nature of the diagnosed sleep disorders- 1) OSA on mandibular device. 2) Insomnia,  3) PLMs. The treatment options and risks for general a health and wellness arising from not treating the condition.  I spent more than 30 minutes of face to face time with the patient. Greater than 50% of time was spent in counseling and coordination of care. We have discussed the diagnosis and differential and I answered the patient's  questions.  1)  His sleep study confirmed OSA at AHI 21/hr but CPAP was poorly tolerated. However in today's meeting be established that the patient's insomnia which has been chronic at this point is interfering with his ability to tolerate CPAP. His referral to a sleep dentist for a mandibular advancement device was sucessfull, treated snoring, but we do not yet have a follow up HST from Dr. Ron Parker.   This type of apnea may well respond to a mandibular device alone. I 2) Insomnia - chronic , failedKlonopin, Lunesta. He now does Ok on Ambien 3-4 times a week, and none on vacation.  I recommend cognitive behavior therapy, possibly some medication. There is several smart phone applications that would play meditative music or give breathing direction.   His comorbidities, including hypertension, coronary artery disease, hyperlipidemia, and chronic pain - all affect sleep as well as the medications.     Plan:  Treatment plan and additional workup :RV in 3-6 month after dental device. I will order the HST by apnea link on device.   [name], M.D.   CC: [name] Pinto, [address] [place], [place] [address]

## 2017-03-05 DIAGNOSIS — M545 Low back pain: Secondary | ICD-10-CM | POA: Diagnosis not present

## 2017-03-05 DIAGNOSIS — M9905 Segmental and somatic dysfunction of pelvic region: Secondary | ICD-10-CM | POA: Diagnosis not present

## 2017-03-05 DIAGNOSIS — M9904 Segmental and somatic dysfunction of sacral region: Secondary | ICD-10-CM | POA: Diagnosis not present

## 2017-03-05 DIAGNOSIS — M9903 Segmental and somatic dysfunction of lumbar region: Secondary | ICD-10-CM | POA: Diagnosis not present

## 2017-03-09 ENCOUNTER — Encounter: Payer: Self-pay | Admitting: Physician Assistant

## 2017-03-09 ENCOUNTER — Other Ambulatory Visit: Payer: Self-pay | Admitting: Physician Assistant

## 2017-03-09 MED ORDER — ALBUTEROL SULFATE HFA 108 (90 BASE) MCG/ACT IN AERS: 2.0000 | INHALATION_SPRAY | Freq: Four times a day (QID) | RESPIRATORY_TRACT | 1 refills | Status: DC | PRN

## 2017-03-16 ENCOUNTER — Encounter: Payer: Self-pay | Admitting: Internal Medicine

## 2017-03-16 ENCOUNTER — Ambulatory Visit (INDEPENDENT_AMBULATORY_CARE_PROVIDER_SITE_OTHER): Payer: Medicare Other | Admitting: Internal Medicine

## 2017-03-16 VITALS — BP 122/76 | HR 72 | Temp 97.9°F | Resp 16 | Ht 70.0 in | Wt 215.0 lb

## 2017-03-16 DIAGNOSIS — I2510 Atherosclerotic heart disease of native coronary artery without angina pectoris: Secondary | ICD-10-CM | POA: Diagnosis not present

## 2017-03-16 DIAGNOSIS — J4531 Mild persistent asthma with (acute) exacerbation: Secondary | ICD-10-CM | POA: Diagnosis not present

## 2017-03-16 DIAGNOSIS — J042 Acute laryngotracheitis: Secondary | ICD-10-CM | POA: Diagnosis not present

## 2017-03-16 MED ORDER — PREDNISONE 20 MG PO TABS: ORAL_TABLET | ORAL | 0 refills | Status: DC

## 2017-03-16 MED ORDER — BENZONATATE 200 MG PO CAPS: ORAL_CAPSULE | ORAL | 1 refills | Status: DC

## 2017-03-16 MED ORDER — AZITHROMYCIN 250 MG PO TABS: ORAL_TABLET | ORAL | 1 refills | Status: DC

## 2017-03-16 NOTE — Progress Notes (Signed)
Subjective:    Patient ID: Bryan [name], male    DOB: [birth year], 67 y.o.   MRN: [id number]  HPI  This very nice 67 yo [name] non-smoker w/ HTN, HLD, ASCAD, PreDM presents with a 2 week hx/o persisting upper/lower respirtatory sx's including wheezing. Denies fever, chills, sweats & sputum has been scant.    Medication Sig  . albuterol HFA  inhaler 2 puffs  every 6  hours as needed  . clopidogrel  75 MG tablet take 1 tab  daily  . ezetimibe10 MG tablet take 1 tab  daily  . losartan 100 MG tablet take 1/2-1 tab daily   . nebivolol 10 MG tablet Take 1 tab  daily.  . rosuvastatin10 MG tablet take 1 tab  daily  . sertraline  50 MG tablet take 1 tab  daily  . tadalafil (CIALIS) 20 MG tablet Take 1/2 to 1 tab every 2 to 3 days as needed for XXXX  . zolpidem10 MG tablet Take 0.5-1 tab at bedtime as needed  . BYSTOLIC 10 MG tablet take 1 tab daily   Allergies  Allergen Reactions  . Lisinopril Cough    ACEi cough  . Amoxicillin Hives and Swelling  . Lunesta [Eszopiclone]     Restless legs  . Penicillins Swelling   Past Medical History:  Diagnosis Date  . CAD (coronary artery disease), native coronary artery    Cath 04/19/2001  normal Left main, moderate proximal disease, 80% stenosis distal LAD, 99% stenosis proximal OM 1, 60% stenosis mid RCA, 70% stenosis ostial mid PDA  PCI with bare metal stent of OM1 same date 3.5 x 18 mm Multilink    . Diverticulosis   . GERD (gastroesophageal reflux disease)   . Hernia   . History of PSVT    Ablation done in 2004 at [facility]   . Hyperlipidemia   . Hypertension   . Symptomatic PVCs   . Vitamin D deficiency    Past Surgical History:  Procedure Laterality Date  . ACHILLES TENDON REPAIR     left  . APPENDECTOMY    . CARDIAC ELECTROPHYSIOLOGY MAPPING AND ABLATION     Review of Systems  10 point systems review negative except as above.    Objective:   Physical Exam  BP 122/76   Pulse 72   Temp 97.9 F (36.6 C)   Resp 16   Ht 5'  10" (1.778 m)   Wt 215 lb (97.5 kg)   BMI 30.85 kg/m   O2 sat 96%  In No Distress  HEENT - Eac's patent. TM's Nl. EOM's full. PERRLA. NasoOroPharynx 2+ injected w/o exudates. Neck - supple. Nl Thyroid. Carotids 2+ & No bruits, nodes, JVD Chest - Bilat scattered rales & wheezes . No rhonchi. Cor - Nl HS. RRR w/o sig m. MS- FROM w/o deformities. Muscle power, tone and bulk Nl. Gait Nl. Neuro - Nl w/o focal abnormalities. Skin - exposed clear w/o rash cyanosis or icterus.    Assessment & Plan:   1. Laryngotracheitis    2. Mild persistent asthma with acute exacerbation  - Sx - Anoro # 7 doses & instructed in use   - predniSONE (DELTASONE) 20 MG tablet; 1 tab 3 x day for 3 days, then 1 tab 2 x day for 3 days, then 1 tab 1 x day for 5 days  Dispense: 20 tablet  - benzonatate (TESSALON) 200 MG capsule; Take 1 perle 3 x / day to prevent cough  Dispense: 30 capsule;  Refill: 1  - azithromycin (ZITHROMAX) 250 MG tablet; Take 2 tablets (500 mg) on  Day 1,  followed by 1 tablet (250 mg) once daily on Days 2 through 5.  Dispense: 6 each; Refill: 1  - Discussed meds/SE's & ROV 7-8 days to recheck

## 2017-03-22 ENCOUNTER — Other Ambulatory Visit: Payer: Self-pay | Admitting: Internal Medicine

## 2017-03-22 ENCOUNTER — Other Ambulatory Visit: Payer: Self-pay | Admitting: Neurology

## 2017-03-23 ENCOUNTER — Other Ambulatory Visit: Payer: Self-pay | Admitting: Neurology

## 2017-03-23 ENCOUNTER — Ambulatory Visit: Payer: Self-pay | Admitting: Internal Medicine

## 2017-03-23 DIAGNOSIS — F5101 Primary insomnia: Secondary | ICD-10-CM

## 2017-03-23 NOTE — Progress Notes (Signed)
NO SHOW

## 2017-03-23 NOTE — Progress Notes (Signed)
Subjective:    Patient ID: Bryan [name], male    DOB: 1950-02-19, 67 y.o.   MRN: [id number]  HPI     This nice 67 yo [name] w/ HTN, HLD, ASCAD, PreDM  returns for 1 week f/u treatment of Asthmatic Tracheobronchitis w/ Z-Pak, Pred taper, tessalon & sx's Anoro.     Patient has recovered from his recent respiratory infection and has concerns re: ongoing sx's of "Restless Legs" as reported by his partner.  Patient also has hx/o OSA w/intolerance to a variety of CPAP masks and apparently is doing well with an oral appliance 'curing' his snoring. Iron studies have been normal and he is taking as recommended an iron supplement.   Medication Sig  . clopidogrel 75 MG  take 1 tablet by mouth once daily  . ezetimibe  10 MG take 1 tablet by mouth once daily  . losartan  100 MG  take 1/2 to 1 tablet by mouth once daily   . nebivolol  10 MG Take 1 tablet (10 mg total) by mouth daily.  . rosuvastatin  10 MG  take 1 tablet by mouth once daily  . sertraline  50 MG  take 1 tablet by mouth once daily  . tadalafil  20 MG  Take 1/2 to 1 tablet every 2 to 3 days as needed for XXXX  . zolpidem 10 MG tablet take 1/2 to 1 tablet by mouth at bedtime if needed for sleep   Allergies  Allergen Reactions  . Lisinopril Cough    ACEi cough  . Amoxicillin Hives and Swelling  . Lunesta [Eszopiclone]     Restless legs  . Penicillins Swelling   Past Medical History:  Diagnosis Date  . CAD (coronary artery disease), native coronary artery    Cath 04/19/2001  normal Left main, moderate proximal disease, 80% stenosis distal LAD, 99% stenosis proximal OM 1, 60% stenosis mid RCA, 70% stenosis ostial mid PDA  PCI with bare metal stent of OM1 same date 3.5 x 18 mm Multilink    . Diverticulosis   . GERD (gastroesophageal reflux disease)   . Hernia   . History of PSVT    Ablation done in 2004 at [facility]   . Hyperlipidemia   . Hypertension   . Symptomatic PVCs   . Vitamin D deficiency    Past Surgical History:   Procedure Laterality Date  . ACHILLES TENDON REPAIR     left  . APPENDECTOMY    . CARDIAC ELECTROPHYSIOLOGY MAPPING AND ABLATION     Review of Systems  10 point systems review negative except as above.    Objective:   Physical Exam  BP (!) 144/92   Pulse 76   Temp 97.8 F (36.6 C)   Resp 16   Ht 5\' 10"  (1.778 m)   Wt 217 lb 3.2 oz (98.5 kg)   BMI 31.16 kg/m   HEENT - WNL. Neck - supple.  Chest - Clear equal BS. No wheezes. Cor - Nl HS. RRR w/o sig MGR. PP 1(+). No edema. MS- FROM w/o deformities.  Gait Nl. Neuro -  Nl w/o focal abnormalities.    Assessment & Plan:     1. Restless legs syndrome (RLS)  - rOPINIRole (REQUIP) 1 MG tablet; Take 1 to 3 tablets 1 to 3 hours before sleep for restless legs  Dispense: 90 tablet; Refill: 2  - to start out taking 1/2 tablet at suppertime and the other 1/2 at bedtime is feeling figidity.  2. OSA (obstructive sleep apnea)

## 2017-03-24 ENCOUNTER — Encounter: Payer: Self-pay | Admitting: Internal Medicine

## 2017-03-24 ENCOUNTER — Telehealth: Payer: Self-pay

## 2017-03-24 ENCOUNTER — Ambulatory Visit (INDEPENDENT_AMBULATORY_CARE_PROVIDER_SITE_OTHER): Payer: Medicare Other | Admitting: Internal Medicine

## 2017-03-24 VITALS — BP 144/92 | HR 76 | Temp 97.8°F | Resp 16 | Ht 70.0 in | Wt 217.2 lb

## 2017-03-24 DIAGNOSIS — I10 Essential (primary) hypertension: Secondary | ICD-10-CM

## 2017-03-24 DIAGNOSIS — G4733 Obstructive sleep apnea (adult) (pediatric): Secondary | ICD-10-CM | POA: Diagnosis not present

## 2017-03-24 DIAGNOSIS — I2510 Atherosclerotic heart disease of native coronary artery without angina pectoris: Secondary | ICD-10-CM

## 2017-03-24 DIAGNOSIS — G2581 Restless legs syndrome: Secondary | ICD-10-CM

## 2017-03-24 DIAGNOSIS — N182 Chronic kidney disease, stage 2 (mild): Secondary | ICD-10-CM

## 2017-03-24 DIAGNOSIS — G4700 Insomnia, unspecified: Secondary | ICD-10-CM

## 2017-03-24 MED ORDER — ROPINIROLE HCL 1 MG PO TABS: ORAL_TABLET | ORAL | 2 refills | Status: DC

## 2017-03-24 NOTE — Telephone Encounter (Signed)
Medicare will not cover a home sleep study if patient has comorbidities such as CAD. Do you want him to come for a PSG?

## 2017-03-24 NOTE — Patient Instructions (Signed)

## 2017-03-25 NOTE — Telephone Encounter (Signed)
Patient called to inquire about his medication and I was able to talk with him at this time and I went over that his insurance required him coming in to have a sleep study comleted in the lab vs the HST. Pt verbalized understanding. I informed him that the sleep lab will contact him to get him set up with this test. His medication was sent to the pharmacy on file and I have also made him aware of this. Pt verbalized understanding and was appreciative.

## 2017-03-25 NOTE — Telephone Encounter (Signed)
Please explain this to him and I will order PSG for him.  RLS, insomnia, CAD, depression.

## 2017-04-16 NOTE — Progress Notes (Signed)
MEDICARE ANNUAL WELLNESS VISIT AND FOLLOW UP Assessment:   Diagnoses and all orders for this visit:  Encounter for Medicare annual wellness exam  Symptomatic PVCs Has very rarely; symptoms stable recently.  Continue with medications, followed by cardiology.   Essential hypertension Losartan recalled - will switch to telmisartan 80 mg - starting with 1/2 tab daily for 1 week and evaluate need for full tab for goal <130/80 discussed  Monitor blood pressure at home; call if consistently over 130/80 Continue DASH diet.   Reminder to go to the ER if any CP, SOB, nausea, dizziness, severe HA, changes vision/speech, left arm numbness and tingling and jaw pain.  History of PSVT Followed by cardiology  Coronary artery disease involving native coronary artery of native heart, angina presence unspecified Control blood pressure, cholesterol, glucose, increase exercise.  Followed by cardiology  OSA (obstructive sleep apnea) Followed by Dr. Brett [name]; intolerant of CPAP but doing well with oral appliance Having a split sleep study done next week with Dr. Brett [name]  CKD (chronic kidney disease) stage 2, GFR 60-89 ml/min Increase fluids, avoid NSAIDS, monitor sugars, will monitor  Vitamin D deficiency Continue supplementation for goal of 70-100 Check vitamin D level  Other abnormal glucose Recent A1Cs at goal Discussed diet/exercise, weight management  Defer A1C; check BMP  Obesity (BMI 30.0-34.9) Long discussion about weight loss, diet, and exercise Recommended diet heavy in fruits and veggies and low in animal meats, cheeses, and dairy products, appropriate calorie intake Discussed appropriate weight for height - weight goal for next visit 210 lb  Follow up at next visit  Medication management CBC, BMP/GFR, LFTs  Primary insomnia Followed by Dr. Brett [name], pending sleep study Good sleep hygiene discussed, increase day time activity Continue with careful use of sleep aid  agents  Mixed hyperlipidemia Triglycerides remain significantly elevated; discussed diet, alcohol, has recently added omega 3, will add fenofibrate if continues to be significantly elevated - risks of pancreatitis discussed Continue statin/zetia Continue low cholesterol diet and exercise.  Check lipid panel.    Over 30 minutes of exam, counseling, chart review, and critical decision making was performed  Future Appointments  Date Time Provider [place]  04/21/2017  8:00 PM [facility] None  07/24/2017  9:30 AM [name] Pinto, MD [facility] None  01/28/2018  9:00 AM [name] Pinto, MD [facility] None  02/08/2018 10:30 AM Dohmeier, Asencion Partridge, MD [facility] None     Plan:   During the course of the visit the patient was educated and counseled about appropriate screening and preventive services including:    Pneumococcal vaccine   Influenza vaccine  Prevnar 13  Td vaccine  Screening electrocardiogram  Colorectal cancer screening  Diabetes screening  Glaucoma screening  Nutrition counseling    Subjective:  Bryan [name] is a 67 y.o. male who presents for Medicare Annual Wellness Visit and 3 month follow up for HTN, hyperlipidemia, glucose management, and vitamin D Def. Patient has hx/o ASCAD with ACS/acute Anterior NSTEMI and PCA w/Stent implantation in 2003 . In 2004 , he had an Ablation for pAfib. Stress Myoview was negative in 2010.  He has ongoing concerns regarding ongoing symptoms of "Restless Legs" as reported by his partner despite normal iron levels/on supplement.  Patient also has hx/o OSA w/intolerance to a variety of CPAP masks and apparently is doing well with an oral appliance 'curing' his snoring. He is pending a PSG by Dr. Brett [name] to investigate ongoing ?RLS symptoms.   BMI is Body mass index is 31.71  kg/m., he has been working on diet and exercise. Wt Readings from Last 3 Encounters:  04/17/17 221 lb (100.2 kg)  03/24/17 217 lb 3.2 oz  (98.5 kg)  03/16/17 215 lb (97.5 kg)   His blood pressure has been controlled at home, today their BP is BP: 112/80 He does workout. He denies chest pain, shortness of breath, dizziness.   He is on cholesterol medication (zetia 10 mg daily, crestor 10 mg daily) and denies myalgias. His cholesterol is not at goal. The cholesterol last visit was:   Lab Results  Component Value Date   CHOL 182 01/09/2017   HDL 37 (L) 01/09/2017   [place]  01/09/2017     Comment:     . LDL cholesterol not calculated. Triglyceride levels greater than 400 mg/dL invalidate calculated LDL results. . Reference range: <100 . Desirable range <100 mg/dL for primary prevention;   <70 mg/dL for patients with CHD or diabetic patients  with > or = 2 CHD risk factors. Marland Kitchen LDL-C is now calculated using the Martin-Hopkins  calculation, which is a validated novel method providing  better accuracy than the Friedewald equation in the  estimation of LDL-C.  Cresenciano Genre et al. Annamaria Helling. 2536;644(03): 2061-2068  (http://education.[facility].com/faq/FAQ164)    TRIG 596 (H) 01/09/2017   CHOLHDL 4.9 01/09/2017   He has been working on diet and exercise for glucose management, and denies foot ulcerations, increased appetite, nausea, paresthesia of the feet, polydipsia, polyuria, visual disturbances, vomiting and weight loss. Last A1C in the office was:  Lab Results  Component Value Date   HGBA1C 5.4 01/09/2017   Last GFR Lab Results  Component Value Date   GFRNONAA 84 01/09/2017    Patient is on Vitamin D supplement but remains below goal of 70:    Lab Results  Component Value Date   VD25OH 40 01/09/2017      Medication Review: Current Outpatient Medications (Cardiovascular):  .  ezetimibe (ZETIA) 10 MG tablet, take 1 tablet by mouth once daily .  losartan (COZAAR) 100 MG tablet, take 1/2 to 1 tablet by mouth once daily for blood pressure and HEART .  nebivolol (BYSTOLIC) 10 MG tablet, Take 1 tablet (10 mg  total) by mouth daily. .  rosuvastatin (CRESTOR) 10 MG tablet, take 1 tablet by mouth once daily .  tadalafil (CIALIS) 20 MG tablet, Take 1/2 to 1 tablet every 2 to 3 days as needed for XXXX  Current Outpatient Medications (Hematological):  .  clopidogrel (PLAVIX) 75 MG tablet, take 1 tablet by mouth once daily  Current Outpatient Medications (Other):  .  rOPINIRole (REQUIP) 1 MG tablet, Take 1 to 3 tablets 1 to 3 hours before sleep for restless legs .  sertraline (ZOLOFT) 50 MG tablet, take 1 tablet by mouth once daily .  zolpidem (AMBIEN) 10 MG tablet, take 1/2 to 1 tablet by mouth at bedtime if needed for sleep  Allergies: Allergies  Allergen Reactions  . Lisinopril Cough    ACEi cough  . Amoxicillin Hives and Swelling  . Lunesta [Eszopiclone]     Restless legs  . Penicillins Swelling    Current Problems (verified) has Hyperlipidemia; Hypertension; Vitamin D deficiency; Other abnormal glucose; Medication management; CAD (coronary artery disease), native coronary artery; History of PSVT; Symptomatic PVCs; CKD (chronic kidney disease) stage 2, GFR 60-89 ml/min; Obesity (BMI 30.0-34.9); Insomnia; and OSA (obstructive sleep apnea) on their problem list.  Screening Tests Immunization History  Administered Date(s) Administered  . Influenza Split 10/13/2013, 10/23/2014  .  Influenza, High Dose Seasonal PF 01/09/2017  . Influenza,inj,quad, With Preservative 01/13/2013  . Influenza-Unspecified 10/07/2011  . PPD Test 10/13/2013, 10/23/2014  . Pneumococcal Conjugate-13 10/13/2013  . Pneumococcal Polysaccharide-23 10/23/2014  . Td 01/06/2006  . Tdap 03/06/2016  . Zoster 09/22/2011   Preventative care: Last colonoscopy: 2013   CXR 2019 MRI lumbar/hip 2017  Prior vaccinations: TD or Tdap: 2018  Influenza: 2016 Pneumococcal: 2016 Prevnar13: 2015 Shingles/Zostavax: 2013  Names of Other Physician/Practitioners you currently use: 1. [facility] here for primary care 2. Lens, eye doctor, last visit 2018 3. Turner, dentist, last visit 2019  Patient Care Team: [name] Pinto, MD as PCP - General (Internal Medicine)  Surgical: He  has a past surgical history that includes Appendectomy; Achilles tendon repair; and Cardiac electrophysiology mapping and ablation. Family His family history includes Cancer in his father; Colon cancer in his mother; Hypothyroidism in his mother; Irritable bowel syndrome in his mother. Social history  He reports that he has never smoked. He has never used smokeless tobacco. He reports that he drinks alcohol. He reports that he does not use drugs.  MEDICARE WELLNESS OBJECTIVES: Physical activity: Current Exercise Habits: Home exercise routine, Type of exercise: treadmill;strength training/weights, Time (Minutes): 40, Frequency (Times/Week): 5, Weekly Exercise (Minutes/Week): 200, Intensity: Moderate, Exercise limited by: None identified Cardiac risk factors: Cardiac Risk Factors include: advanced age (>56men, >51 women);hypertension;male gender;dyslipidemia Depression/mood screen:   Depression screen [facility] 2/9 04/17/2017  Decreased Interest 0  Down, Depressed, Hopeless 0  PHQ - 2 Score 0    ADLs:  In your present state of health, do you have any difficulty performing the following activities: 04/17/2017 01/09/2017  Hearing? N N  Vision? N N  Difficulty concentrating or making decisions? N N  Walking or climbing stairs? N N  Dressing or bathing? N N  Doing errands, shopping? N N  Some recent data might be hidden     Cognitive Testing  Alert? Yes  Normal Appearance?Yes  Oriented to person? Yes  Place? Yes   Time? Yes  Recall of three objects?  Yes  Can perform simple calculations? Yes  Displays appropriate judgment?Yes  Can read the correct time from a watch face?Yes  EOL planning: Does Patient Have a Medical Advance Directive?: Yes Type of Advance Directive: Healthcare Power of Attorney,  Living will Does patient want to make changes to medical advance directive?: No - Patient declined Copy of [place] in Chart?: No - copy requested   Objective:   Today's Vitals   04/17/17 0901  BP: 112/80  Pulse: 76  Temp: 97.9 F (36.6 C)  SpO2: 95%  Weight: 221 lb (100.2 kg)  Height: 5\' 10"  (1.778 m)   Body mass index is 31.71 kg/m.  General appearance: alert, no distress, WD/WN, male HEENT: normocephalic, sclerae anicteric, TMs pearly, nares patent, no discharge or erythema, pharynx normal Oral cavity: MMM, no lesions Neck: supple, no lymphadenopathy, no thyromegaly, no masses Heart: RRR, normal S1, S2, no murmurs Lungs: CTA bilaterally, no wheezes, rhonchi, or rales Abdomen: +bs, soft, non tender, non distended, no masses, no hepatomegaly, no splenomegaly Musculoskeletal: nontender, no swelling, no obvious deformity Extremities: no edema, no cyanosis, no clubbing Pulses: 2+ symmetric, upper and lower extremities, normal cap refill Neurological: alert, oriented x 3, CN2-12 intact, strength normal upper extremities and lower extremities, sensation normal throughout, DTRs 2+ throughout, no cerebellar signs, gait normal Psychiatric: normal affect, behavior normal, pleasant   Medicare Attestation I have personally  reviewed: The patient's medical and social history Their use of alcohol, tobacco or illicit drugs Their current medications and supplements The patient's functional ability including ADLs,fall risks, home safety risks, cognitive, and hearing and visual impairment Diet and physical activities Evidence for depression or mood disorders  The patient's weight, height, BMI, and visual acuity have been recorded in the chart.  I have made referrals, counseling, and provided education to the patient based on review of the above and I have provided the patient with a written personalized care plan for preventive services.     Izora Ribas,  NP   04/17/2017

## 2017-04-17 ENCOUNTER — Ambulatory Visit (INDEPENDENT_AMBULATORY_CARE_PROVIDER_SITE_OTHER): Payer: Medicare Other | Admitting: Adult Health

## 2017-04-17 ENCOUNTER — Encounter: Payer: Self-pay | Admitting: Adult Health

## 2017-04-17 VITALS — BP 112/80 | HR 76 | Temp 97.9°F | Ht 70.0 in | Wt 221.0 lb

## 2017-04-17 DIAGNOSIS — E782 Mixed hyperlipidemia: Secondary | ICD-10-CM

## 2017-04-17 DIAGNOSIS — R7309 Other abnormal glucose: Secondary | ICD-10-CM | POA: Diagnosis not present

## 2017-04-17 DIAGNOSIS — G4733 Obstructive sleep apnea (adult) (pediatric): Secondary | ICD-10-CM | POA: Diagnosis not present

## 2017-04-17 DIAGNOSIS — Z0001 Encounter for general adult medical examination with abnormal findings: Secondary | ICD-10-CM | POA: Diagnosis not present

## 2017-04-17 DIAGNOSIS — R6889 Other general symptoms and signs: Secondary | ICD-10-CM | POA: Diagnosis not present

## 2017-04-17 DIAGNOSIS — E559 Vitamin D deficiency, unspecified: Secondary | ICD-10-CM | POA: Diagnosis not present

## 2017-04-17 DIAGNOSIS — I10 Essential (primary) hypertension: Secondary | ICD-10-CM | POA: Diagnosis not present

## 2017-04-17 DIAGNOSIS — Z79899 Other long term (current) drug therapy: Secondary | ICD-10-CM | POA: Diagnosis not present

## 2017-04-17 DIAGNOSIS — F5101 Primary insomnia: Secondary | ICD-10-CM | POA: Diagnosis not present

## 2017-04-17 DIAGNOSIS — I2510 Atherosclerotic heart disease of native coronary artery without angina pectoris: Secondary | ICD-10-CM

## 2017-04-17 DIAGNOSIS — N182 Chronic kidney disease, stage 2 (mild): Secondary | ICD-10-CM

## 2017-04-17 DIAGNOSIS — E669 Obesity, unspecified: Secondary | ICD-10-CM | POA: Diagnosis not present

## 2017-04-17 DIAGNOSIS — I471 Supraventricular tachycardia: Secondary | ICD-10-CM | POA: Diagnosis not present

## 2017-04-17 DIAGNOSIS — I493 Ventricular premature depolarization: Secondary | ICD-10-CM

## 2017-04-17 DIAGNOSIS — Z0000 Encounter for general adult medical examination without abnormal findings: Secondary | ICD-10-CM

## 2017-04-17 MED ORDER — TELMISARTAN 80 MG PO TABS: 80.0000 mg | ORAL_TABLET | Freq: Every day | ORAL | 1 refills | Status: DC

## 2017-04-17 NOTE — Patient Instructions (Signed)
Sending in telmisartan 80 mg tabs - please start by taking 1/2 tab (40 mg daily) for a week to evaluate blood pressure control (goal under 130/80) - increase to full tab only if needed.    Food Choices to Lower Your Triglycerides Triglycerides are a type of fat in your blood. High levels of triglycerides can increase the risk of heart disease and stroke. If your triglyceride levels are high, the foods you eat and your eating habits are very important. Choosing the right foods can help lower your triglycerides. What general guidelines do I need to follow?  Lose weight if you are overweight.  Limit or avoid alcohol.  Fill one half of your plate with vegetables and green salads.  Limit fruit to two servings a day. Choose fruit instead of juice.  Make one fourth of your plate whole grains. Look for the word "whole" as the first word in the ingredient list.  Fill one fourth of your plate with lean protein foods.  Enjoy fatty fish (such as salmon, mackerel, sardines, and tuna) three times a week.  Choose healthy fats.  Limit foods high in starch and sugar.  Eat more home-cooked food and less restaurant, buffet, and fast food.  Limit fried foods.  Cook foods using methods other than frying.  Limit saturated fats.  Check ingredient lists to avoid foods with partially hydrogenated oils (trans fats) in them. What foods can I eat? Grains Whole grains, such as whole wheat or whole grain breads, crackers, cereals, and pasta. Unsweetened oatmeal, bulgur, barley, quinoa, or brown rice. Corn or whole wheat flour tortillas. Vegetables Fresh or frozen vegetables (raw, steamed, roasted, or grilled). Green salads. Fruits All fresh, canned (in natural juice), or frozen fruits. Meat and Other Protein Products Ground beef (85% or leaner), grass-fed beef, or beef trimmed of fat. Skinless chicken or [place]. Ground chicken or [place]. Pork trimmed of fat. All fish and seafood. Eggs. Dried beans, peas,  or lentils. Unsalted nuts or seeds. Unsalted canned or dry beans. Dairy Low-fat dairy products, such as skim or 1% milk, 2% or reduced-fat cheeses, low-fat ricotta or cottage cheese, or plain low-fat yogurt. Fats and Oils Tub margarines without trans fats. Light or reduced-fat mayonnaise and salad dressings. Avocado. Safflower, olive, or canola oils. Natural peanut or almond butter. The items listed above may not be a complete list of recommended foods or beverages. Contact your dietitian for more options. What foods are not recommended? Grains White bread. White pasta. White rice. Cornbread. Bagels, pastries, and croissants. Crackers that contain trans fat. Vegetables White potatoes. Corn. Creamed or fried vegetables. Vegetables in a cheese sauce. Fruits Dried fruits. Canned fruit in light or heavy syrup. Fruit juice. Meat and Other Protein Products Fatty cuts of meat. Ribs, chicken wings, bacon, sausage, bologna, salami, chitterlings, fatback, hot dogs, bratwurst, and packaged luncheon meats. Dairy Whole or 2% milk, cream, half-and-half, and cream cheese. Whole-fat or sweetened yogurt. Full-fat cheeses. Nondairy creamers and whipped toppings. Processed cheese, cheese spreads, or cheese curds. Sweets and Desserts Corn syrup, sugars, honey, and molasses. Candy. Jam and jelly. Syrup. Sweetened cereals. Cookies, pies, cakes, donuts, muffins, and ice cream. Fats and Oils Butter, stick margarine, lard, shortening, ghee, or bacon fat. Coconut, palm kernel, or palm oils. Beverages Alcohol. Sweetened drinks (such as sodas, lemonade, and fruit drinks or punches). The items listed above may not be a complete list of foods and beverages to avoid. Contact your dietitian for more information. This information is not intended to replace advice  given to you by your health care provider. Make sure you discuss any questions you have with your health care provider. Document Released: 10/11/2003 Document  Revised: 05/31/2015 Document Reviewed: 10/27/2012 Elsevier Interactive Patient Education  2017 [employer].

## 2017-04-18 LAB — CBC WITH DIFFERENTIAL/PLATELET
BASOS ABS: 30 {cells}/uL (ref 0–200)
Basophils Relative: 0.6 %
Eosinophils Absolute: 260 cells/uL (ref 15–500)
Eosinophils Relative: 5.2 %
HCT: 46.9 % (ref 38.5–50.0)
HEMOGLOBIN: 16.1 g/dL (ref 13.2–17.1)
Lymphs Abs: 1415 cells/uL (ref 850–3900)
MCH: 29.5 pg (ref 27.0–33.0)
MCHC: 34.3 g/dL (ref 32.0–36.0)
MCV: 85.9 fL (ref 80.0–100.0)
MONOS PCT: 10.6 %
MPV: 10.3 fL (ref 7.5–12.5)
NEUTROS ABS: 2765 {cells}/uL (ref 1500–7800)
Neutrophils Relative %: 55.3 %
Platelets: 239 10*3/uL (ref 140–400)
RBC: 5.46 10*6/uL (ref 4.20–5.80)
RDW: 13 % (ref 11.0–15.0)
TOTAL LYMPHOCYTE: 28.3 %
WBC mixed population: 530 cells/uL (ref 200–950)
WBC: 5 10*3/uL (ref 3.8–10.8)

## 2017-04-18 LAB — BASIC METABOLIC PANEL WITH GFR
BUN: 14 mg/dL (ref 7–25)
CO2: 31 mmol/L (ref 20–32)
CREATININE: 1.03 mg/dL (ref 0.70–1.25)
Calcium: 9.4 mg/dL (ref 8.6–10.3)
Chloride: 104 mmol/L (ref 98–110)
GFR, Est African American: 87 mL/min/{1.73_m2} (ref >=60)
GFR, Est Non African American: 75 mL/min/{1.73_m2} (ref >=60)
GLUCOSE: 104 mg/dL — AB (ref 65–99)
Potassium: 4.7 mmol/L (ref 3.5–5.3)
SODIUM: 139 mmol/L (ref 135–146)

## 2017-04-18 LAB — HEPATIC FUNCTION PANEL
AG RATIO: 1.8 (calc) (ref 1.0–2.5)
ALKALINE PHOSPHATASE (APISO): 58 U/L (ref 40–115)
ALT: 31 U/L (ref 9–46)
AST: 26 U/L (ref 10–35)
Albumin: 4.3 g/dL (ref 3.6–5.1)
BILIRUBIN DIRECT: 0.2 mg/dL (ref 0.0–0.2)
BILIRUBIN TOTAL: 0.8 mg/dL (ref 0.2–1.2)
Globulin: 2.4 g/dL (calc) (ref 1.9–3.7)
Indirect Bilirubin: 0.6 mg/dL (calc) (ref 0.2–1.2)
TOTAL PROTEIN: 6.7 g/dL (ref 6.1–8.1)

## 2017-04-18 LAB — VITAMIN D 25 HYDROXY (VIT D DEFICIENCY, FRACTURES): Vit D, 25-Hydroxy: 58 ng/mL (ref 30–100)

## 2017-04-18 LAB — LIPID PANEL
Cholesterol: 158 mg/dL (ref <200)
HDL: 48 mg/dL (ref >40)
LDL Cholesterol (Calc): 73 mg/dL (calc)
NON-HDL CHOLESTEROL (CALC): 110 mg/dL (ref <130)
Total CHOL/HDL Ratio: 3.3 (calc) (ref <5.0)
Triglycerides: 306 mg/dL — ABNORMAL HIGH (ref <150)

## 2017-04-18 LAB — TSH: TSH: 1.33 mIU/L (ref 0.40–4.50)

## 2017-05-14 NOTE — Progress Notes (Deleted)
Assessment and Plan:  There are no diagnoses linked to this encounter.    Further disposition pending results of labs. Discussed med's effects and SE's.   Over 15 minutes of exam, counseling, chart review, and critical decision making was performed.   Future Appointments  Date Time Provider [place]  05/18/2017  8:45 AM [name], NP [facility] None  07/24/2017  9:30 AM [name] Pinto, MD [facility] None  01/28/2018  9:00 AM [name] Pinto, MD [facility] None  02/08/2018 10:30 AM Dohmeier, Asencion Partridge, MD [facility] None  04/23/2018  9:30 AM [name], NP [facility] None    ------------------------------------------------------------------------------------------------------------------   HPI There were no vitals taken for this visit.  66 y.o.male presents for  zoloft 50 mg   Ambien 10 mg     Past Medical History:  Diagnosis Date  . CAD (coronary artery disease), native coronary artery    Cath 04/19/2001  normal Left main, moderate proximal disease, 80% stenosis distal LAD, 99% stenosis proximal OM 1, 60% stenosis mid RCA, 70% stenosis ostial mid PDA  PCI with bare metal stent of OM1 same date 3.5 x 18 mm Multilink    . Diverticulosis   . GERD (gastroesophageal reflux disease)   . Hernia   . History of PSVT    Ablation done in 2004 at [facility]   . Hyperlipidemia   . Hypertension   . Symptomatic PVCs   . Vitamin D deficiency      Allergies  Allergen Reactions  . Lisinopril Cough    ACEi cough  . Amoxicillin Hives and Swelling  . Lunesta [Eszopiclone]     Restless legs  . Penicillins Swelling    Current Outpatient Medications on File Prior to Visit  Medication Sig  . clopidogrel (PLAVIX) 75 MG tablet take 1 tablet by mouth once daily  . ezetimibe (ZETIA) 10 MG tablet take 1 tablet by mouth once daily  . nebivolol (BYSTOLIC) 10 MG tablet Take 1 tablet (10 mg total) by mouth daily.  . Omega-3 Fatty Acids (OMEGA 3 PO) Take 1 capsule by mouth  daily.  Marland Kitchen rOPINIRole (REQUIP) 1 MG tablet Take 1 to 3 tablets 1 to 3 hours before sleep for restless legs  . rosuvastatin (CRESTOR) 10 MG tablet take 1 tablet by mouth once daily  . sertraline (ZOLOFT) 50 MG tablet take 1 tablet by mouth once daily  . tadalafil (CIALIS) 20 MG tablet Take 1/2 to 1 tablet every 2 to 3 days as needed for XXXX  . telmisartan (MICARDIS) 80 MG tablet Take 1 tablet (80 mg total) by mouth daily.  Marland Kitchen zolpidem (AMBIEN) 10 MG tablet take 1/2 to 1 tablet by mouth at bedtime if needed for sleep   No current facility-administered medications on file prior to visit.     ROS: all negative except above.   Physical Exam:  There were no vitals taken for this visit.  General Appearance: Well nourished, in no apparent distress. Eyes: PERRLA, EOMs, conjunctiva no swelling or erythema Sinuses: No Frontal/maxillary tenderness ENT/Mouth: Ext aud canals clear, TMs without erythema, bulging. No erythema, swelling, or exudate on post pharynx.  Tonsils not swollen or erythematous. Hearing normal.  Neck: Supple, thyroid normal.  Respiratory: Respiratory effort normal, BS equal bilaterally without rales, rhonchi, wheezing or stridor.  Cardio: RRR with no MRGs. Brisk peripheral pulses without edema.  Abdomen: Soft, + BS.  Non tender, no guarding, rebound, hernias, masses. Lymphatics: Non tender without lymphadenopathy.  Musculoskeletal: Full ROM, 5/5 strength, normal gait.  Skin: Warm,  dry without rashes, lesions, ecchymosis.  Neuro: Cranial nerves intact. Normal muscle tone, no cerebellar symptoms. Sensation intact.  Psych: Awake and oriented X 3, normal affect, Insight and Judgment appropriate.     Izora Ribas, NP 1:12 PM [facility]

## 2017-05-18 ENCOUNTER — Ambulatory Visit: Payer: Self-pay | Admitting: Adult Health

## 2017-06-04 ENCOUNTER — Ambulatory Visit (INDEPENDENT_AMBULATORY_CARE_PROVIDER_SITE_OTHER): Payer: Medicare Other | Admitting: Adult Health

## 2017-06-04 ENCOUNTER — Encounter: Payer: Self-pay | Admitting: Adult Health

## 2017-06-04 VITALS — BP 120/72 | HR 78 | Temp 97.5°F | Ht 70.0 in | Wt 213.0 lb

## 2017-06-04 DIAGNOSIS — B370 Candidal stomatitis: Secondary | ICD-10-CM | POA: Diagnosis not present

## 2017-06-04 DIAGNOSIS — J209 Acute bronchitis, unspecified: Secondary | ICD-10-CM

## 2017-06-04 DIAGNOSIS — I2510 Atherosclerotic heart disease of native coronary artery without angina pectoris: Secondary | ICD-10-CM | POA: Diagnosis not present

## 2017-06-04 MED ORDER — FLUCONAZOLE 100 MG PO TABS: ORAL_TABLET | ORAL | 1 refills | Status: DC

## 2017-06-04 MED ORDER — DOXYCYCLINE HYCLATE 100 MG PO TABS: 100.0000 mg | ORAL_TABLET | Freq: Two times a day (BID) | ORAL | 0 refills | Status: AC

## 2017-06-04 MED ORDER — PREDNISONE 20 MG PO TABS
ORAL_TABLET | ORAL | 0 refills | Status: DC
Start: 2017-06-04 — End: 2018-01-28

## 2017-06-04 NOTE — Progress Notes (Signed)
Assessment and Plan:  Acute bronchitis, unspecified organism Suggested symptomatic OTC remedies. Nasal saline spray for congestion. Nasal steroids, allergy pill, oral steroids Follow up as needed. -     doxycycline (VIBRA-TABS) 100 MG tablet; Take 1 tablet (100 mg total) by mouth 2 (two) times daily for 7 days. -     predniSONE (DELTASONE) 20 MG tablet; 2 tablets daily for 3 days, 1 tablet daily for 4 days.  Oral thrush Stop trellegy, spiriva sample provided alternatively Do salt water gargles, continue with probiotic -     fluconazole (DIFLUCAN) 100 MG tablet; Take 2 tabs (200 mg) first day then once daily. Continue until 2 days after symptoms resolve.  Further disposition pending results of labs. Discussed med's effects and SE's.   Over 15 minutes of exam, counseling, chart review, and critical decision making was performed.   Future Appointments  Date Time Provider [place]  06/04/2017  9:45 AM [name], NP [facility] None  07/21/2017  4:30 PM [name] Pinto, MD [facility] None  01/28/2018  9:00 AM [name] Pinto, MD [facility] None  02/08/2018 10:30 AM Dohmeier, Asencion Partridge, MD [facility] None  04/23/2018  9:30 AM [name], NP [facility] None    ------------------------------------------------------------------------------------------------------------------  HPI BP 120/72   Pulse 78   Temp (!) 97.5 F (36.4 C)   Ht 5\' 10"  (1.778 m)   Wt 213 lb (96.6 kg)   SpO2 94%   BMI 30.56 kg/m   67 y.o.male presents for evaluation of URI (productive cough, hoarseness) x 3 weeks and thrush in his mouth x 2 weeks. He reports he was feeling unwell a few weeks ago and took a zpak, and restarted using a sample of trellegy ellipta for 7 doses until he ran out. He has also been continuing with zyrtec for allergies and mucinex. He additionally tried old prescription for magic mouthwash but ran out after 3-4 days; reports thrush was improving, but that seems to be getting worse  since yesterday.    Past Medical History:  Diagnosis Date  . CAD (coronary artery disease), native coronary artery    Cath 04/19/2001  normal Left main, moderate proximal disease, 80% stenosis distal LAD, 99% stenosis proximal OM 1, 60% stenosis mid RCA, 70% stenosis ostial mid PDA  PCI with bare metal stent of OM1 same date 3.5 x 18 mm Multilink    . Diverticulosis   . GERD (gastroesophageal reflux disease)   . Hernia   . History of PSVT    Abl[facility] done in 2004 at [facility]   . Hyperlipidemia   . Hypertension   . Symptomatic PVCs   . Vitamin D deficiency      Allergies  Allergen Reactions  . Lisinopril Cough    ACEi cough  . Amoxicillin Hives and Swelling  . Lunesta [Eszopiclone]     Restless legs  . Penicillins Swelling    Current Outpatient Medications on File Prior to Visit  Medication Sig  . albuterol (VENTOLIN HFA) 108 (90 Base) MCG/ACT inhaler Inhale into the lungs every 6 (six) hours as needed for wheezing or shortness of breath.  . clopidogrel (PLAVIX) 75 MG tablet take 1 tablet by mouth once daily  . ezetimibe (ZETIA) 10 MG tablet take 1 tablet by mouth once daily  . nebivolol (BYSTOLIC) 10 MG tablet Take 1 tablet (10 mg total) by mouth daily.  . Omega-3 Fatty Acids (OMEGA 3 PO) Take 1 capsule by mouth daily.  Marland Kitchen rOPINIRole (REQUIP) 1 MG tablet Take 1 to 3 tablets  1 to 3 hours before sleep for restless legs  . rosuvastatin (CRESTOR) 10 MG tablet take 1 tablet by mouth once daily  . sertraline (ZOLOFT) 50 MG tablet take 1 tablet by mouth once daily  . tadalafil (CIALIS) 20 MG tablet Take 1/2 to 1 tablet every 2 to 3 days as needed for XXXX  . telmisartan (MICARDIS) 80 MG tablet Take 1 tablet (80 mg total) by mouth daily.  Marland Kitchen zolpidem (AMBIEN) 10 MG tablet take 1/2 to 1 tablet by mouth at bedtime if needed for sleep   No current facility-administered medications on file prior to visit.     ROS: Review of Systems  Constitutional: Negative for chills,  diaphoresis, fever and malaise/fatigue.  HENT: Positive for congestion and sore throat. Negative for ear discharge, ear pain, hearing loss, sinus pain and tinnitus.   Eyes: Negative for blurred vision, pain, discharge and redness.  Respiratory: Positive for cough, sputum production and wheezing. Negative for hemoptysis, shortness of breath and stridor.   Cardiovascular: Negative for chest pain, palpitations and orthopnea.  Gastrointestinal: Negative for abdominal pain, diarrhea, nausea and vomiting.  Genitourinary: Negative.   Musculoskeletal: Negative for joint pain and myalgias.  Skin: Negative for rash.  Neurological: Negative for dizziness, sensory change, weakness and headaches.  Endo/Heme/Allergies: Negative for environmental allergies.  Psychiatric/Behavioral: Negative.   All other systems reviewed and are negative.    Physical Exam:  BP 120/72   Pulse 78   Temp (!) 97.5 F (36.4 C)   Ht 5\' 10"  (1.778 m)   Wt 213 lb (96.6 kg)   SpO2 94%   BMI 30.56 kg/m   General Appearance: Well nourished, in no apparent distress. Eyes: PERRLA, EOMs, conjunctiva no swelling or erythema Sinuses: No Frontal/maxillary tenderness ENT/Mouth: Ext aud canals clear, TMs without erythema, bulging. No erythema, swelling, on posterior pharynx, tongue covered with white coat.  Tonsils not swollen or erythematous. Hearing normal.  Neck: Supple, thyroid normal.  Respiratory: Respiratory effort normal, BS equal bilaterally with scattered rhonchi throughout without rales, wheezing or stridor.  Cardio: RRR with no MRGs. Brisk peripheral pulses without edema.  Abdomen: Soft, + BS.  Non tender, no guarding, rebound, hernias, masses. Lymphatics: Non tender without lymphadenopathy.  Musculoskeletal: Full ROM, 5/5 strength, normal gait.  Skin: Warm, dry without rashes, lesions, ecchymosis.  Neuro: Cranial nerves intact. Normal muscle tone, no cerebellar symptoms. Sensation intact.  Psych: Awake and oriented  X 3, normal affect, Insight and Judgment appropriate.     Izora Ribas, NP 9:44 AM [facility]

## 2017-06-04 NOTE — Patient Instructions (Signed)
Take 2 puffs of spiriva daily while having URI symptoms   Oral Thrush, Adult Oral thrush, also called oral candidiasis, is a fungal infection that develops in the mouth and throat and on the tongue. It causes white patches to form on the mouth and tongue. Ritta Slot is most common in older adults, but it can occur at any age. Many cases of thrush are mild, but this infection can also be serious. Ritta Slot can be a repeated (recurrent) problem for certain people who have a weak body defense system (immune system). The weakness can be caused by chronic illnesses, or by taking medicines that limit the body's ability to fight infection. If a person has difficulty fighting infection, the fungus that causes thrush can spread through the body. This can cause life-threatening blood or organ infections. What are the causes? This condition is caused by a fungus (yeast) called Candida albicans.  This fungus is normally present in small amounts in the mouth and on other mucous membranes. It usually causes no harm.  If conditions are present that allow the fungus to grow without control, it invades surrounding tissues and becomes an infection.  Other Candida species can also lead to thrush (rare).  What increases the risk? This condition is more likely to develop in:  People with a weakened immune system.  Older adults.  People with HIV (human immunodeficiency virus).  People with diabetes.  People with dry mouth (xerostomia).  Pregnant women.  People with poor dental care, especially people who have false teeth.  People who use antibiotic medicines.  What are the signs or symptoms? Symptoms of this condition can vary from mild and moderate to severe and persistent. Symptoms may include:  A burning feeling in the mouth and throat. This can occur at the start of a thrush infection.  White patches that stick to the mouth and tongue. The tissue around the patches may be red, raw, and painful. If  rubbed (during tooth brushing, for example), the patches and the tissue of the mouth may bleed easily.  A bad taste in the mouth or difficulty tasting foods.  A cottony feeling in the mouth.  Pain during eating and swallowing.  Poor appetite.  Cracking at the corners of the mouth.  How is this diagnosed? This condition is diagnosed based on:  Physical exam. Your health care provider will look in your mouth.  Health history. Your health care provider will ask you questions about your health.  How is this treated? This condition is treated with medicines called antifungals, which prevent the growth of fungi. These medicines are either applied directly to the affected area (topical) or swallowed (oral). The treatment will depend on the severity of the condition. Mild thrush Mild cases of thrush may clear up with the use of an antifungal mouth rinse or lozenges. Treatment usually lasts about 14 days. Moderate to severe thrush  More severe thrush infections that have spread to the esophagus are treated with an oral antifungal medicine. A topical antifungal medicine may also be used.  For some severe infections, treatment may need to continue for more than 14 days.  Oral antifungal medicines are rarely used during pregnancy because they may be harmful to the unborn child. If you are pregnant, talk with your health care provider about options for treatment. Persistent or recurrent thrush For cases of thrush that do not go away or keep coming back:  Treatment may be needed twice as long as the symptoms last.  Treatment will include  both oral and topical antifungal medicines.  People with a weakened immune system can take an antifungal medicine on a continuous basis to prevent thrush infections.  It is important to treat conditions that make a person more likely to get thrush, such as diabetes or HIV. Follow these instructions at home: Medicines  Take over-the-counter and  prescription medicines only as told by your health care provider.  Talk with your health care provider about an over-the-counter medicine called gentian violet, which kills bacteria and fungi. Relieving soreness and discomfort To help reduce the discomfort of thrush:  Drink cold liquids such as water or iced tea.  Try flavored ice treats or frozen juices.  Eat foods that are easy to swallow, such as gelatin, ice cream, or custard.  Try drinking from a straw if the patches in your mouth are painful.  General instructions  Eat plain, unflavored yogurt as directed by your health care provider. Check the label to make sure the yogurt contains live cultures. This yogurt can help healthy bacteria to grow in the mouth and can stop the growth of the fungus that causes thrush.  If you wear dentures, remove the dentures before going to bed, brush them vigorously, and soak them in a cleaning solution as directed by your health care provider.  Rinse your mouth with a warm salt-water mixture several times a day. To make a salt-water mixture, completely dissolve 1/2-1 tsp of salt in 1 cup of warm water. Contact a health care provider if:  Your symptoms are getting worse or are not improving within 7 days of starting treatment.  You have symptoms of a spreading infection, such as white patches on the skin outside of the mouth. This information is not intended to replace advice given to you by your health care provider. Make sure you discuss any questions you have with your health care provider. Document Released: 09/18/2003 Document Revised: 09/17/2015 Document Reviewed: 09/17/2015 Elsevier Interactive Patient Education  2017 [employer].

## 2017-06-09 ENCOUNTER — Other Ambulatory Visit: Payer: Self-pay | Admitting: Internal Medicine

## 2017-06-09 ENCOUNTER — Other Ambulatory Visit: Payer: Self-pay | Admitting: Neurology

## 2017-06-09 ENCOUNTER — Other Ambulatory Visit: Payer: Self-pay | Admitting: Physician Assistant

## 2017-06-09 NOTE — Telephone Encounter (Signed)
Rx sent electronically by Dr. Brett Fairy.

## 2017-06-09 NOTE — Telephone Encounter (Signed)
Rx registry checked. Last fill date 04/28/17 for #30. Last OV 02/05/17 and next OV 02/08/2018.

## 2017-06-10 ENCOUNTER — Telehealth: Payer: Self-pay | Admitting: *Deleted

## 2017-06-10 NOTE — Telephone Encounter (Signed)
Per Dr Melford Aase, the patient was informed he received a refill for his Ambien 10 mg #30 from Dr Brett Fairy on 06/09/2017 and he cannot refill his Clonazepam 2 mg tablet.  He was informed he cannot take both medications and he should get the Clonazepam from Dr Brett Fairy, if she thinks he needs both medications.

## 2017-06-19 ENCOUNTER — Other Ambulatory Visit: Payer: Self-pay | Admitting: Physician Assistant

## 2017-06-30 IMAGING — CR DG HIP (WITH OR WITHOUT PELVIS) 2-3V*L*
3 series · 3 of 3 positions shown · non-contrast
Comparison: None.

CLINICAL DATA: Remote history of fall. Continued left posterior hip
pain.

EXAM:
DG HIP (WITH OR WITHOUT PELVIS) 2-3V LEFT

[pelvis ap]
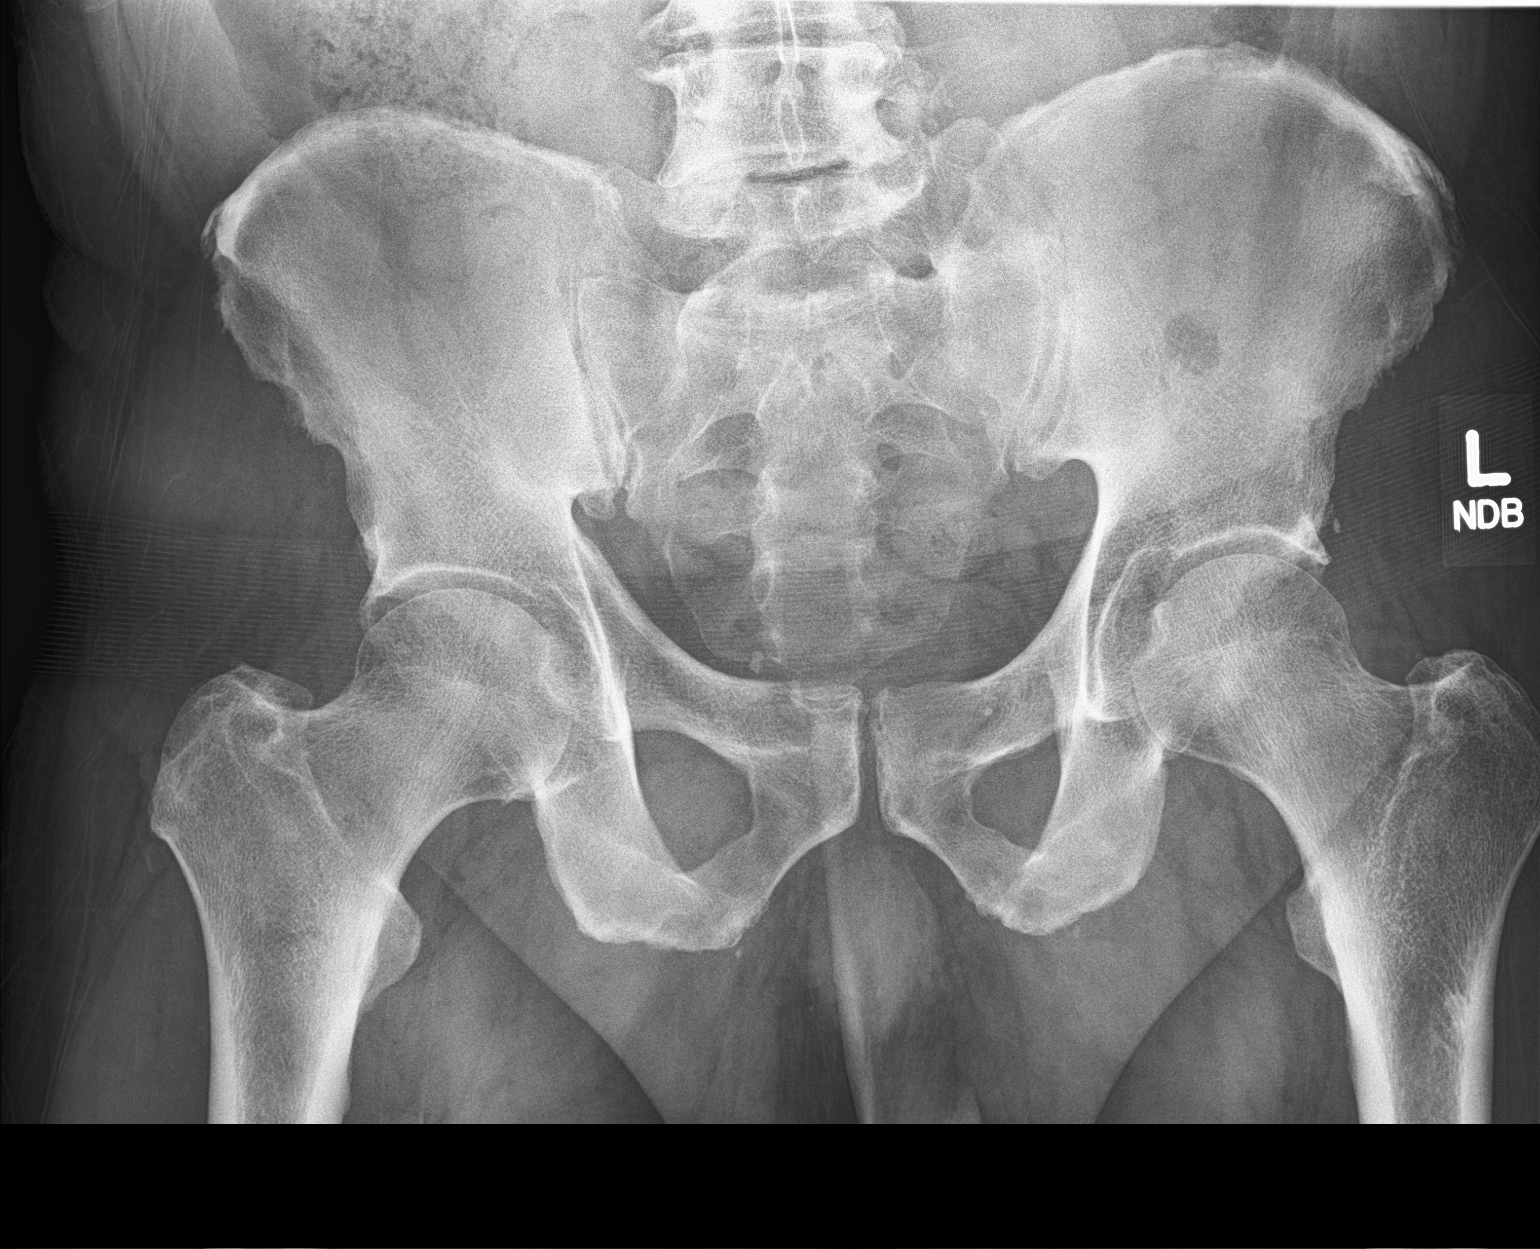

[hip ap]
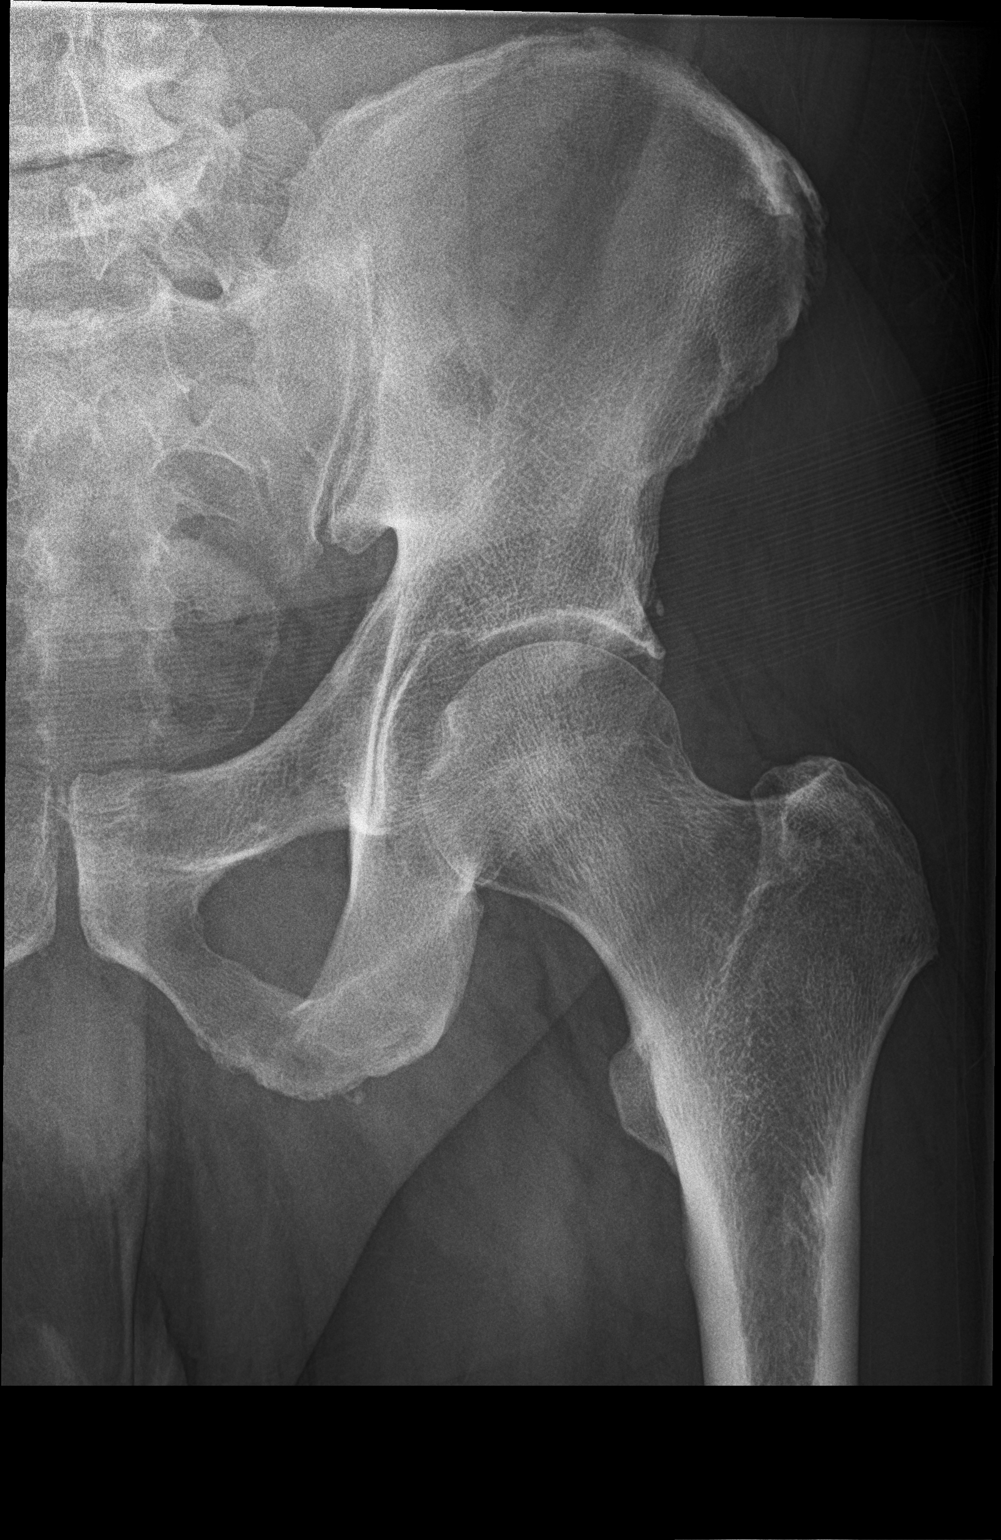

[hip lat]
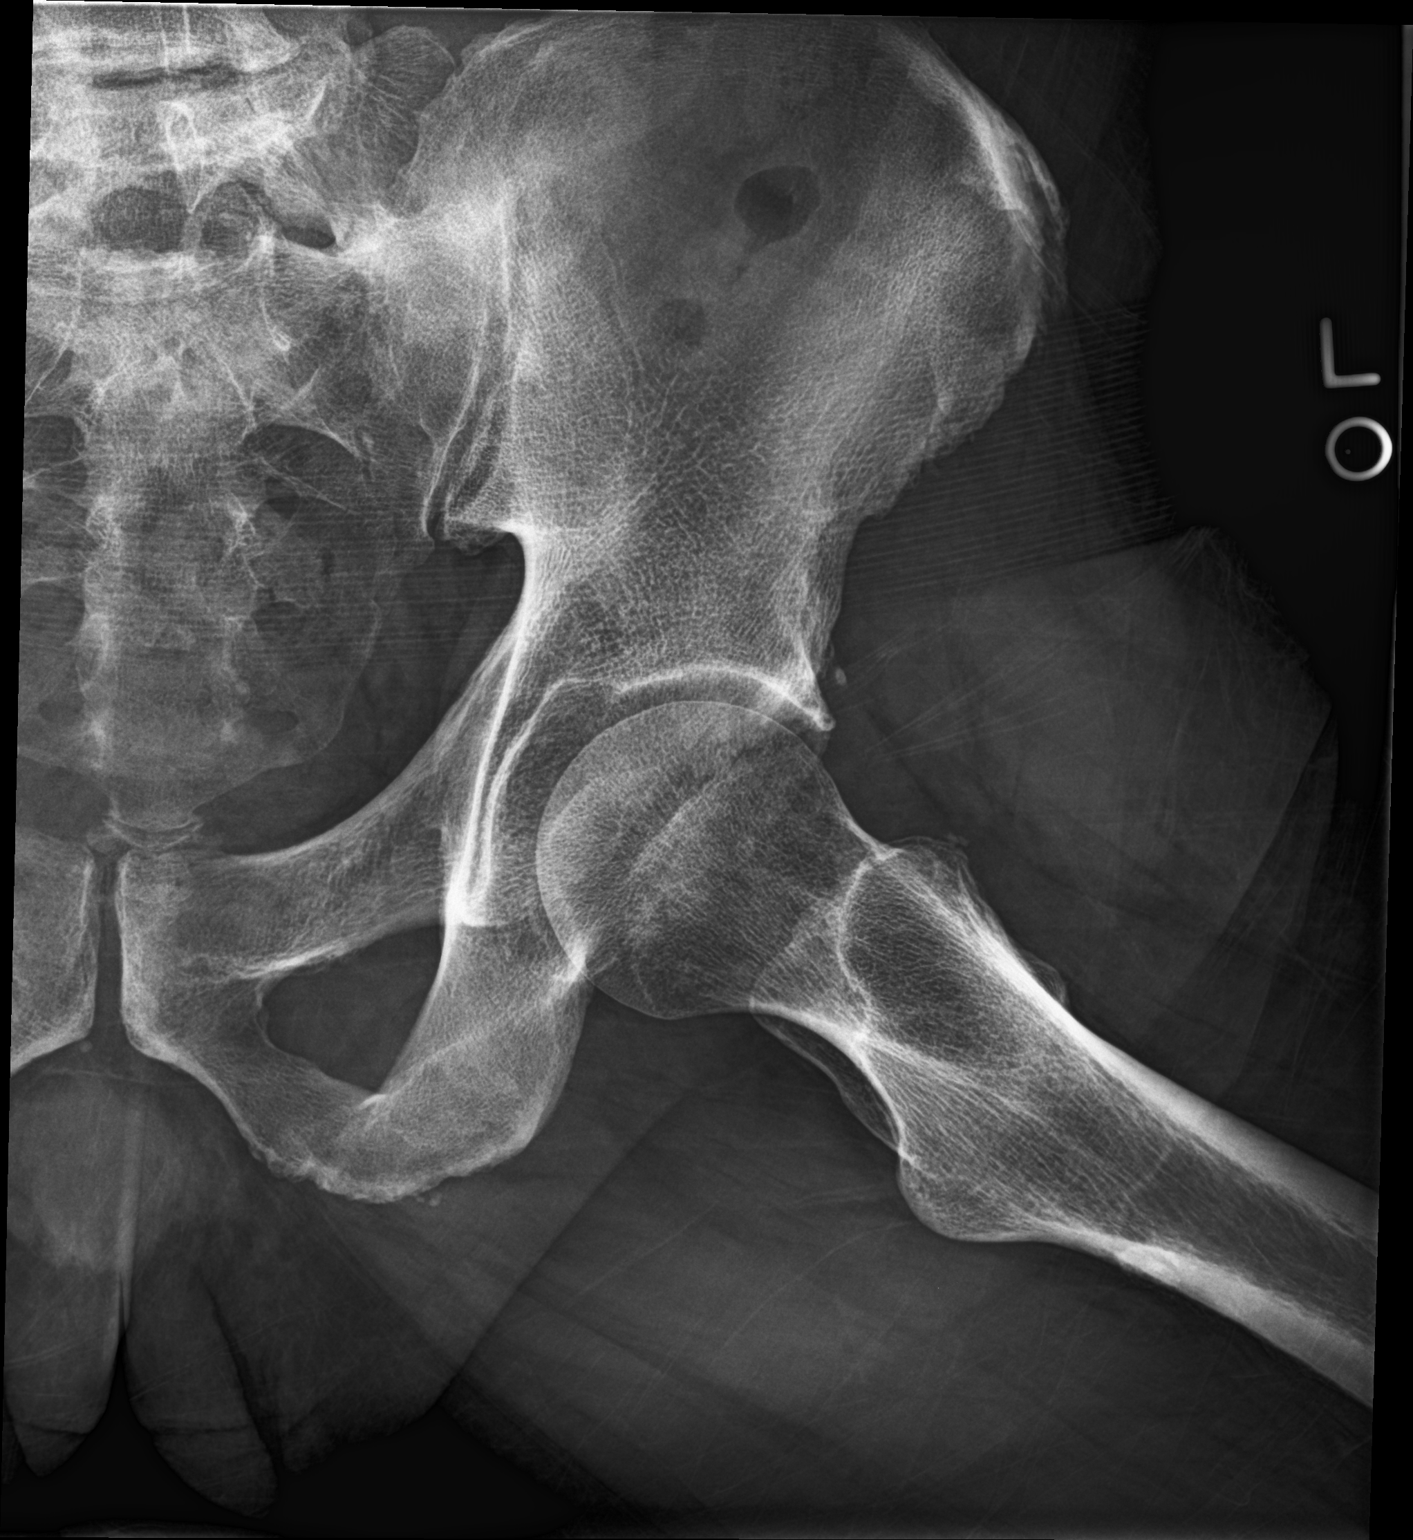

[3 of 3 positions shown; findings below may reference images not displayed]

FINDINGS: There is no evidence of hip fracture or dislocation. There is no
evidence of arthropathy or other focal bone abnormality. Hip joints
and SI joints are symmetric and unremarkable.
IMPRESSION: Negative.

## 2017-07-06 ENCOUNTER — Other Ambulatory Visit: Payer: Self-pay | Admitting: Neurology

## 2017-07-07 NOTE — Telephone Encounter (Signed)
Rx registry checked. Last fill date is 06/09/17 for #30. Last OV was 02/05/17 and next OV is 02/08/18. Ok to refill in Dr. Edwena Felty absence?

## 2017-07-07 NOTE — Telephone Encounter (Signed)
Rx sent electronically.  

## 2017-07-20 ENCOUNTER — Other Ambulatory Visit: Payer: Self-pay | Admitting: Adult Health

## 2017-07-21 ENCOUNTER — Encounter: Payer: Self-pay | Admitting: Internal Medicine

## 2017-07-21 ENCOUNTER — Ambulatory Visit (INDEPENDENT_AMBULATORY_CARE_PROVIDER_SITE_OTHER): Payer: Medicare Other | Admitting: Adult Health

## 2017-07-21 VITALS — BP 118/76 | HR 73 | Temp 97.5°F | Ht 70.0 in | Wt 218.0 lb

## 2017-07-21 DIAGNOSIS — R7309 Other abnormal glucose: Secondary | ICD-10-CM | POA: Diagnosis not present

## 2017-07-21 DIAGNOSIS — E559 Vitamin D deficiency, unspecified: Secondary | ICD-10-CM | POA: Diagnosis not present

## 2017-07-21 DIAGNOSIS — E669 Obesity, unspecified: Secondary | ICD-10-CM

## 2017-07-21 DIAGNOSIS — I10 Essential (primary) hypertension: Secondary | ICD-10-CM | POA: Diagnosis not present

## 2017-07-21 DIAGNOSIS — E782 Mixed hyperlipidemia: Secondary | ICD-10-CM

## 2017-07-21 DIAGNOSIS — R7303 Prediabetes: Secondary | ICD-10-CM | POA: Diagnosis not present

## 2017-07-21 DIAGNOSIS — M5416 Radiculopathy, lumbar region: Secondary | ICD-10-CM

## 2017-07-21 DIAGNOSIS — Z79899 Other long term (current) drug therapy: Secondary | ICD-10-CM | POA: Diagnosis not present

## 2017-07-21 DIAGNOSIS — I2510 Atherosclerotic heart disease of native coronary artery without angina pectoris: Secondary | ICD-10-CM | POA: Diagnosis not present

## 2017-07-21 DIAGNOSIS — M5412 Radiculopathy, cervical region: Secondary | ICD-10-CM

## 2017-07-21 NOTE — Patient Instructions (Addendum)
Aim for 7+ servings of fruits and vegetables daily  80+ fluid ounces of water or unsweet tea for healthy kidneys  Limit alcohol intake, avoid smoking  Limit animal fats in diet for cholesterol and heart health - choose grass fed whenever available  Aim for low stress - take time to unwind and care for your mental health  Aim for 150 min of moderate intensity exercise weekly for heart health, and weights twice weekly for bone health  Aim for 7-9 hours of sleep daily   Food Choices to Lower Your Triglycerides Triglycerides are a type of fat in your blood. High levels of triglycerides can increase the risk of heart disease and stroke. If your triglyceride levels are high, the foods you eat and your eating habits are very important. Choosing the right foods can help lower your triglycerides. What general guidelines do I need to follow?  Lose weight if you are overweight.  Limit or avoid alcohol.  Fill one half of your plate with vegetables and green salads.  Limit fruit to two servings a day. Choose fruit instead of juice.  Make one fourth of your plate whole grains. Look for the word "whole" as the first word in the ingredient list.  Fill one fourth of your plate with lean protein foods.  Enjoy fatty fish (such as salmon, mackerel, sardines, and tuna) three times a week.  Choose healthy fats.  Limit foods high in starch and sugar.  Eat more home-cooked food and less restaurant, buffet, and fast food.  Limit fried foods.  Cook foods using methods other than frying.  Limit saturated fats.  Check ingredient lists to avoid foods with partially hydrogenated oils (trans fats) in them. What foods can I eat? Grains Whole grains, such as whole wheat or whole grain breads, crackers, cereals, and pasta. Unsweetened oatmeal, bulgur, barley, quinoa, or brown rice. Corn or whole wheat flour tortillas. Vegetables Fresh or frozen vegetables (raw, steamed, roasted, or grilled). Green  salads. Fruits All fresh, canned (in natural juice), or frozen fruits. Meat and Other Protein Products Ground beef (85% or leaner), grass-fed beef, or beef trimmed of fat. Skinless chicken or [place]. Ground chicken or [place]. Pork trimmed of fat. All fish and seafood. Eggs. Dried beans, peas, or lentils. Unsalted nuts or seeds. Unsalted canned or dry beans. Dairy Low-fat dairy products, such as skim or 1% milk, 2% or reduced-fat cheeses, low-fat ricotta or cottage cheese, or plain low-fat yogurt. Fats and Oils Tub margarines without trans fats. Light or reduced-fat mayonnaise and salad dressings. Avocado. Safflower, olive, or canola oils. Natural peanut or almond butter. The items listed above may not be a complete list of recommended foods or beverages. Contact your dietitian for more options. What foods are not recommended? Grains White bread. White pasta. White rice. Cornbread. Bagels, pastries, and croissants. Crackers that contain trans fat. Vegetables White potatoes. Corn. Creamed or fried vegetables. Vegetables in a cheese sauce. Fruits Dried fruits. Canned fruit in light or heavy syrup. Fruit juice. Meat and Other Protein Products Fatty cuts of meat. Ribs, chicken wings, bacon, sausage, bologna, salami, chitterlings, fatback, hot dogs, bratwurst, and packaged luncheon meats. Dairy Whole or 2% milk, cream, half-and-half, and cream cheese. Whole-fat or sweetened yogurt. Full-fat cheeses. Nondairy creamers and whipped toppings. Processed cheese, cheese spreads, or cheese curds. Sweets and Desserts Corn syrup, sugars, honey, and molasses. Candy. Jam and jelly. Syrup. Sweetened cereals. Cookies, pies, cakes, donuts, muffins, and ice cream. Fats and Oils Butter, stick margarine, lard, shortening, ghee,  or bacon fat. Coconut, palm kernel, or palm oils. Beverages Alcohol. Sweetened drinks (such as sodas, lemonade, and fruit drinks or punches). The items listed above may not be a complete  list of foods and beverages to avoid. Contact your dietitian for more information. This information is not intended to replace advice given to you by your health care provider. Make sure you discuss any questions you have with your health care provider. Document Released: 10/11/2003 Document Revised: 05/31/2015 Document Reviewed: 10/27/2012 Elsevier Interactive Patient Education  2017 [place].   Fenofibrate capsules What is this medicine? FENOFIBRATE (fen oh FYE brate) capsules can help lower blood fats and cholesterol for people who are at risk of getting inflammation of the pancreas (pancreatitis) from having very high amounts of fats in their blood. This medicine is only for patients whose blood fats are not controlled by diet. This medicine may be used for other purposes; ask your health care provider or pharmacist if you have questions. COMMON BRAND NAME(S): Lipofen What should I tell my health care provider before I take this medicine? They need to know if you have any of these conditions: -gallbladder disease -heart disease -kidney disease -liver disease -an unusual or allergic reaction to fenofibrate, gemfibrozil, other medicines, foods, dyes, or preservatives -pregnant or trying to get pregnant -breast-feeding How should I use this medicine? Take this medicine by mouth with a glass of water. Follow the directions on the prescription label. Take with food. Take your medicine at regular intervals. Do not take it more often than directed. Do not stop taking except on your doctor's advice. Talk to your pediatrician regarding the use of this medicine in children. Special care may be needed. Overdosage: If you think you have taken too much of this medicine contact a poison control center or emergency room at once. NOTE: This medicine is only for you. Do not share this medicine with others. What if I miss a dose? If you miss a dose, take it as soon as you can. If it is almost time  for your next dose, take only that dose. Do not take double or extra doses. What may interact with this medicine? This medicine may interact with the following medications: -bile acid resins like cholestyramine, colesevelam, and colestipol -certain medicines for cholesterol like atorvastatin, lovastatin, and simvastatin -certain medicines for diabetes, like glipizide or glyburide -certain medicines that suppress the body's immune response like cyclosporine and tacrolimus -certain medicines that treat or prevent blood clots like warfarin -colchicine -ezetimibe -supplements like red yeast rice This list may not describe all possible interactions. Give your health care provider a list of all the medicines, herbs, non-prescription drugs, or dietary supplements you use. Also tell them if you smoke, drink alcohol, or use illegal drugs. Some items may interact with your medicine. What should I watch for while using this medicine? Visit your doctor or health care professional for regular checks on your progress. Your blood fat levels and other tests will be measured from time to time. Do not stop taking this medicine except on the advice of your doctor or health care professional. This medicine is only part of a total cholesterol-lowering program. Your health care professional or dietician can suggest a low-cholesterol and low-fat diet that will reduce your risk of getting heart and blood vessel disease. Avoid alcohol and smoking, and keep a proper exercise schedule. If you are diabetic, close regulation and monitoring of your blood sugars can help your blood fat levels. This medicine may change  the way your diabetic medicine works, and sometimes will require that your dosages be adjusted. Check with your doctor or health care professional. This medicine can make you more sensitive to the sun. Keep out of the sun. If you cannot avoid being in the sun, wear protective clothing and use sunscreen. Do not use  sun lamps or tanning beds/booths. What side effects may I notice from receiving this medicine? Side effects that you should report to your doctor or health care professional as soon as possible: -allergic reactions like skin rash, itching or hives, swelling of the face, lips, or tongue -dark urine -lower back or side pain -muscle pain, tenderness, or weakness -skin-bruising -stomach pain -trouble passing urine or change in the amount of urine -unusually weak or tired -yellowing of the eyes or skin Side effects that usually do not require medical attention (report to your doctor or health care professional if they continue or are bothersome): -constipation -headache -nausea This list may not describe all possible side effects. Call your doctor for medical advice about side effects. You may report side effects to FDA at 1-800-FDA-1088. Where should I keep my medicine? Keep out of the reach of children. Store at room temperature between 15 and 30 degrees C (59 and 86 degrees F). Keep container tightly closed. Throw away any unused medicine after the expiration date. NOTE: This sheet is a summary. It may not cover all possible information. If you have questions about this medicine, talk to your doctor, pharmacist, or health care provider.  2018 Elsevier/Gold Standard (2015-07-16 09:17:03)

## 2017-07-21 NOTE — Progress Notes (Deleted)
This very nice 67 y.o.male presents for 3 month follow up with HTN, HLD, Pre-Diabetes and Vitamin D Deficiency.      Patient is treated for HTN & BP has been controlled at home. Today's  . Patient has had no complaints of any cardiac type chest pain, palpitations, dyspnea / orthopnea / PND, dizziness, claudication, or dependent edema.     Hyperlipidemia is controlled with diet & meds. Patient denies myalgias or other med SE's. Last Lipids were  Lab Results  Component Value Date   CHOL 158 04/17/2017   HDL 48 04/17/2017   LDLCALC 73 04/17/2017   TRIG 306 (H) 04/17/2017   CHOLHDL 3.3 04/17/2017      Also, the patient has history of T2_NIDDM PreDiabetes and has had no symptoms of reactive hypoglycemia, diabetic polys, paresthesias or visual blurring.  Last A1c was  Lab Results  Component Value Date   HGBA1C 5.4 01/09/2017      Further, the patient also has history of Vitamin D Deficiency and supplements vitamin D without any suspected side-effects. Last vitamin D was   Lab Results  Component Value Date   VD25OH 41 04/17/2017   Current Outpatient Medications on File Prior to Visit  Medication Sig  . albuterol (VENTOLIN HFA) 108 (90 Base) MCG/ACT inhaler Inhale into the lungs every 6 (six) hours as needed for wheezing or shortness of breath.  . clopidogrel (PLAVIX) 75 MG tablet take 1 tablet by mouth once daily  . ezetimibe (ZETIA) 10 MG tablet TAKE 1 TABLET BY MOUTH ONCE DAILY  . fluconazole (DIFLUCAN) 100 MG tablet Take 2 tabs (200 mg) first day then once daily. Continue until 2 days after symptoms resolve.  . nebivolol (BYSTOLIC) 10 MG tablet Take 1 tablet (10 mg total) by mouth daily.  . Omega-3 Fatty Acids (OMEGA 3 PO) Take 1 capsule by mouth daily.  . predniSONE (DELTASONE) 20 MG tablet 2 tablets daily for 3 days, 1 tablet daily for 4 days.  Marland Kitchen rOPINIRole (REQUIP) 1 MG tablet Take 1 to 3 tablets 1 to 3 hours before sleep for restless legs  . rosuvastatin (CRESTOR) 10 MG tablet  TAKE 1 TABLET BY MOUTH ONCE DAILY  . sertraline (ZOLOFT) 50 MG tablet take 1 tablet by mouth once daily  . tadalafil (CIALIS) 20 MG tablet Take 1/2 to 1 tablet every 2 to 3 days as needed for XXXX  . telmisartan (MICARDIS) 80 MG tablet Take 1 tablet (80 mg total) by mouth daily.  Marland Kitchen zolpidem (AMBIEN) 10 MG tablet TAKE 1/2 TO 1 TABLET BY MOUTH AT BEDTIME AS NEEDED FOR SLEEP   No current facility-administered medications on file prior to visit.    Allergies  Allergen Reactions  . Lisinopril Cough    ACEi cough  . Amoxicillin Hives and Swelling  . Lunesta [Eszopiclone]     Restless legs  . Penicillins Swelling   PMHx:   Past Medical History:  Diagnosis Date  . CAD (coronary artery disease), native coronary artery    Cath 04/19/2001  normal Left main, moderate proximal disease, 80% stenosis distal LAD, 99% stenosis proximal OM 1, 60% stenosis mid RCA, 70% stenosis ostial mid PDA  PCI with bare metal stent of OM1 same date 3.5 x 18 mm Multilink    . Diverticulosis   . GERD (gastroesophageal reflux disease)   . Hernia   . History of PSVT    Ablation done in 2004 at [facility]   . Hyperlipidemia   .  Hypertension   . Symptomatic PVCs   . Vitamin D deficiency    Immunization History  Administered Date(s) Administered  . Influenza Split 10/13/2013, 10/23/2014  . Influenza, High Dose Seasonal PF 01/09/2017  . Influenza,inj,quad, With Preservative 01/13/2013  . Influenza-Unspecified 10/07/2011  . PPD Test 10/13/2013, 10/23/2014  . Pneumococcal Conjugate-13 10/13/2013  . Pneumococcal Polysaccharide-23 10/23/2014  . Td 01/06/2006  . Tdap 03/06/2016  . Zoster 09/22/2011   Past Surgical History:  Procedure Laterality Date  . ACHILLES TENDON REPAIR     left  . APPENDECTOMY    . CARDIAC ELECTROPHYSIOLOGY MAPPING AND ABLATION     FHx:    Reviewed / unchanged  SHx:    Reviewed / unchanged   Systems Review:  Constitutional: Denies fever, chills, wt changes, headaches,  insomnia, fatigue, night sweats, change in appetite. Eyes: Denies redness, blurred vision, diplopia, discharge, itchy, watery eyes.  ENT: Denies discharge, congestion, post nasal drip, epistaxis, sore throat, earache, hearing loss, dental pain, tinnitus, vertigo, sinus pain, snoring.  CV: Denies chest pain, palpitations, irregular heartbeat, syncope, dyspnea, diaphoresis, orthopnea, PND, claudication or edema. Respiratory: denies cough, dyspnea, DOE, pleurisy, hoarseness, laryngitis, wheezing.  Gastrointestinal: Denies dysphagia, odynophagia, heartburn, reflux, water brash, abdominal pain or cramps, nausea, vomiting, bloating, diarrhea, constipation, hematemesis, melena, hematochezia  or hemorrhoids. Genitourinary: Denies dysuria, frequency, urgency, nocturia, hesitancy, discharge, hematuria or flank pain. Musculoskeletal: Denies arthralgias, myalgias, stiffness, jt. swelling, pain, limping or strain/sprain.  Skin: Denies pruritus, rash, hives, warts, acne, eczema or change in skin lesion(s). Neuro: No weakness, tremor, incoordination, spasms, paresthesia or pain. Psychiatric: Denies confusion, memory loss or sensory loss. Endo: Denies change in weight, skin or hair change.  Heme/Lymph: No excessive bleeding, bruising or enlarged lymph nodes.  Physical Exam  There were no vitals taken for this visit.  Appears  well nourished, well groomed  and in no distress.  Eyes: PERRLA, EOMs, conjunctiva no swelling or erythema. Sinuses: No frontal/maxillary tenderness ENT/Mouth: EAC's clear, TM's nl w/o erythema, bulging. Nares clear w/o erythema, swelling, exudates. Oropharynx clear without erythema or exudates. Oral hygiene is good. Tongue normal, non obstructing. Hearing intact.  Neck: Supple. Thyroid not palpable. Car 2+/2+ without bruits, nodes or JVD. Chest: Respirations nl with BS clear & equal w/o rales, rhonchi, wheezing or stridor.  Cor: Heart sounds normal w/ regular rate and rhythm without  sig. murmurs, gallops, clicks or rubs. Peripheral pulses normal and equal  without edema.  Abdomen: Soft & bowel sounds normal. Non-tender w/o guarding, rebound, hernias, masses or organomegaly.  Lymphatics: Unremarkable.  Musculoskeletal: Full ROM all peripheral extremities, joint stability, 5/5 strength and normal gait.  Skin: Warm, dry without exposed rashes, lesions or ecchymosis apparent.  Neuro: Cranial nerves intact, reflexes equal bilaterally. Sensory-motor testing grossly intact. Tendon reflexes grossly intact.  Pysch: Alert & oriented x 3.  Insight and judgement nl & appropriate. No ideations.  Assessment and Plan:  - Continue medication, monitor blood pressure at home.  - Continue DASH diet.  Reminder to go to the ER if any CP,  SOB, nausea, dizziness, severe HA, changes vision/speech.  - Continue diet/meds, exercise,& lifestyle modifications.  - Continue monitor periodic cholesterol/liver & renal functions   - Continue diet, exercise, lifestyle modifications.  - Monitor appropriate labs. - Continue supplementation.      Discussed  regular exercise, BP monitoring, weight control to achieve/maintain BMI less than 25 and discussed med and SE's. Recommended labs to assess and monitor clinical status with further disposition pending results of labs. Over 30  minutes of exam, counseling, chart review was performed.

## 2017-07-21 NOTE — Progress Notes (Signed)
FOLLOW UP  Assessment and Plan:   CAD Control blood pressure, cholesterol, glucose, increase exercise.  Followed by cardiology  Hypertension Well controlled with current medications  Monitor blood pressure at home; patient to call if consistently greater than 130/80 Continue DASH diet.   Reminder to go to the ER if any CP, SOB, nausea, dizziness, severe HA, changes vision/speech, left arm numbness and tingling and jaw pain.  Cholesterol Currently at LDL goal; diet for trigs discussed, on omega 3, if remains severely elevated discussed will add fenofibrate Continue low cholesterol diet and exercise.  Check lipid panel.   Other abnormal glucose Continue diet and exercise.  Perform daily foot/skin check, notify office of any concerning changes.  Check A1C, insulin levels  Obesity with co morbidities Long discussion about weight loss, diet, and exercise Recommended diet heavy in fruits and veggies and low in animal meats, cheeses, and dairy products, appropriate calorie intake Discussed ideal weight for height Will follow up in 3 months  Vitamin D Def Below goal at last visit; continue supplementation to maintain goal of 70-100 Check Vit D level  Chronic lumbar pain with radicular pain to left extremity Worse in last 2 months, significantly limiting activities, not responding to conservative measures Straight leg neg + MRI changes noted from 2017, has had significant fall while skiing since then Refer to neurosurgery for possible repeat injections vs alternative options after discussion with patient   Continue diet and meds as discussed. Further disposition pending results of labs. Discussed med's effects and SE's.   Over 30 minutes of exam, counseling, chart review, and critical decision making was performed.   Future Appointments  Date Time Provider [place]  01/28/2018  9:00 AM [name] Pinto, MD [facility] None  02/08/2018 10:30 AM Dohmeier, Asencion Partridge, MD [facility]  None  04/23/2018  9:30 AM [name], NP [facility] None    ----------------------------------------------------------------------------------------------------------------------  HPI 67 y.o. male  presents for 3 month follow up on hypertension, cholesterol, glucose management, obesity and vitamin D deficiency. Patient has hx/o ASCAD with ACS/acute Anterior NSTEMI and PCA w/Stent implantation in 2003 . In 2004 , he had an Ablation for pAfib. Stress Myoview was negative in 2010. He is pending sleep study next week for RLS.   BMI is Body mass index is 31.28 kg/m., he has not been working on diet and admits to limited exercise due to back and hip pain.  Wt Readings from Last 3 Encounters:  07/21/17 218 lb (98.9 kg)  06/04/17 213 lb (96.6 kg)  04/17/17 221 lb (100.2 kg)   His blood pressure has been controlled at home, today their BP is BP: 118/76  He does not workout. He denies chest pain, shortness of breath, dizziness.   He is on cholesterol medication (rosuvastatin 10 mg daily, zetia 10 mg daily) and denies myalgias. His LDL cholesterol is at goal. Trigs remain elevated despite omega 3 supplement. The cholesterol last visit was:   Lab Results  Component Value Date   CHOL 158 04/17/2017   HDL 48 04/17/2017   LDLCALC 73 04/17/2017   TRIG 306 (H) 04/17/2017   CHOLHDL 3.3 04/17/2017    He has not been working on diet and exercise for glucose management, and denies foot ulcerations, increased appetite, nausea, paresthesia of the feet, polydipsia, polyuria, visual disturbances, vomiting and weight loss. Last A1C in the office was:  Lab Results  Component Value Date   HGBA1C 5.4 01/09/2017   Patient is on Vitamin D supplement.   Lab Results  Component Value Date   VD25OH 40 04/17/2017     He reports ongoing lower back pain, with radiating pain to left hip, reports very limiting, has sensation of grinding through left SI joint. Pain seems worse since May, reports pain is  intermittent, but when standing or walking pain is constant and worsens as he remains on his feet.   He was seeing Dr. Berenice Primas for this and was recommended L hip total arthroplasty despite apparent intact joint space and he declined this. He reports he did get injections to back which were helpful for some time.   He also reports in the last 3 weeks he has also been waking up with neck pain, numbness of upper extremity and pain of hand with cramping in fingers that resolves with repositioning. Having bilaterally.   He does report significant fall last year while skiing in [facility]  He has tried aleve/ibuprofen but reports benefit is limited as pain resolves once he sits down. Main concern is significantly limited activity due to pain with ambulation/standing.   He would like a referral to a new provider for management.   MRI lumbar from 10/20/2015 demonstrates:   S1 is considered a transitional segment and is partially lumbarized.  Advanced disc degeneration L2-3 with mild foraminal narrowing bilaterally  Advanced disc degeneration L3-4 with spurring and mild spinal stenosis. Mild subarticular and foraminal narrowing bilaterally.  Advanced disc degeneration and spurring L4-5. Mild spinal stenosis. Mild subarticular and foraminal stenosis bilaterally.  Advanced disc degeneration and spurring L5-S1. Marked left foraminal encroachment with compression of the left L5 nerve root.    Current Medications:  Current Outpatient Medications on File Prior to Visit  Medication Sig  . albuterol (VENTOLIN HFA) 108 (90 Base) MCG/ACT inhaler Inhale into the lungs every 6 (six) hours as needed for wheezing or shortness of breath.  . clopidogrel (PLAVIX) 75 MG tablet take 1 tablet by mouth once daily  . ezetimibe (ZETIA) 10 MG tablet TAKE 1 TABLET BY MOUTH ONCE DAILY  . nebivolol (BYSTOLIC) 10 MG tablet Take 1 tablet (10 mg total) by mouth daily.  Marland Kitchen rOPINIRole (REQUIP) 1 MG tablet Take 1 to 3  tablets 1 to 3 hours before sleep for restless legs  . rosuvastatin (CRESTOR) 10 MG tablet TAKE 1 TABLET BY MOUTH ONCE DAILY  . sertraline (ZOLOFT) 50 MG tablet take 1 tablet by mouth once daily  . telmisartan (MICARDIS) 80 MG tablet Take 1 tablet (80 mg total) by mouth daily.  Marland Kitchen zolpidem (AMBIEN) 10 MG tablet TAKE 1/2 TO 1 TABLET BY MOUTH AT BEDTIME AS NEEDED FOR SLEEP  . fluconazole (DIFLUCAN) 100 MG tablet Take 2 tabs (200 mg) first day then once daily. Continue until 2 days after symptoms resolve.  . Omega-3 Fatty Acids (OMEGA 3 PO) Take 1 capsule by mouth daily.  . predniSONE (DELTASONE) 20 MG tablet 2 tablets daily for 3 days, 1 tablet daily for 4 days.  . tadalafil (CIALIS) 20 MG tablet Take 1/2 to 1 tablet every 2 to 3 days as needed for XXXX (Patient not taking: Reported on 07/21/2017)   No current facility-administered medications on file prior to visit.      Allergies:  Allergies  Allergen Reactions  . Lisinopril Cough    ACEi cough  . Amoxicillin Hives and Swelling  . Lunesta [Eszopiclone]     Restless legs  . Penicillins Swelling     Medical History:  Past Medical History:  Diagnosis Date  . CAD (coronary artery disease), native  coronary artery    Cath 04/19/2001  normal Left main, moderate proximal disease, 80% stenosis distal LAD, 99% stenosis proximal OM 1, 60% stenosis mid RCA, 70% stenosis ostial mid PDA  PCI with bare metal stent of OM1 same date 3.5 x 18 mm Multilink    . Diverticulosis   . GERD (gastroesophageal reflux disease)   . Hernia   . History of PSVT    Ablation done in 2004 at [facility]   . Hyperlipidemia   . Hypertension   . Symptomatic PVCs   . Vitamin D deficiency    Family history- Reviewed and unchanged Social history- Reviewed and unchanged   Review of Systems:  Review of Systems  Constitutional: Negative for malaise/fatigue and weight loss.  HENT: Negative for hearing loss and tinnitus.   Eyes: Negative for blurred vision and  double vision.  Respiratory: Negative for cough, shortness of breath and wheezing.   Cardiovascular: Negative for chest pain, palpitations, orthopnea, claudication and leg swelling.  Gastrointestinal: Negative for abdominal pain, blood in stool, constipation, diarrhea, heartburn, melena, nausea and vomiting.  Genitourinary: Negative.   Musculoskeletal: Positive for back pain (Lumbar with radiation to hip), falls and neck pain (At night). Negative for joint pain and myalgias.  Skin: Negative for rash.  Neurological: Positive for sensory change (bilateral upper extremities numb at night with hand cramping). Negative for dizziness, tingling, weakness and headaches.  Endo/Heme/Allergies: Negative for polydipsia.  Psychiatric/Behavioral: Negative.   All other systems reviewed and are negative.   Physical Exam: BP 118/76   Pulse 73   Temp (!) 97.5 F (36.4 C)   Ht 5\' 10"  (1.778 m)   Wt 218 lb (98.9 kg)   SpO2 97%   BMI 31.28 kg/m  Wt Readings from Last 3 Encounters:  07/21/17 218 lb (98.9 kg)  06/04/17 213 lb (96.6 kg)  04/17/17 221 lb (100.2 kg)   General Appearance: Well nourished, in no apparent distress. Eyes: PERRLA, EOMs, conjunctiva no swelling or erythema Sinuses: No Frontal/maxillary tenderness ENT/Mouth: Ext aud canals clear, TMs without erythema, bulging. No erythema, swelling, or exudate on post pharynx.  Tonsils not swollen or erythematous. Hearing normal.  Neck: Supple, thyroid normal.  Respiratory: Respiratory effort normal, BS equal bilaterally without rales, rhonchi, wheezing or stridor.  Cardio: RRR with no MRGs. Brisk peripheral pulses without edema.  Abdomen: Soft, + BS.  Non tender, no guarding, rebound, hernias, masses. Lymphatics: Non tender without lymphadenopathy.  Musculoskeletal: Full ROM, 5/5 strength, Normal gait. He does have left SI joint tenderness, neg straight leg raise, no spinous tenderness. Hip ROM intact. Phalen's negative.  Skin: Warm, dry  without rashes, lesions, ecchymosis.  Neuro: Cranial nerves intact. No cerebellar symptoms.  Psych: Awake and oriented X 3, normal affect, Insight and Judgment appropriate.   Izora Ribas, NP 5:23 PM [facility]

## 2017-07-22 ENCOUNTER — Encounter: Payer: Self-pay | Admitting: Adult Health

## 2017-07-22 LAB — CBC WITH DIFFERENTIAL/PLATELET
BASOS PCT: 0.6 %
Basophils Absolute: 32 cells/uL (ref 0–200)
Eosinophils Absolute: 221 cells/uL (ref 15–500)
Eosinophils Relative: 4.1 %
HCT: 44.1 % (ref 38.5–50.0)
HEMOGLOBIN: 15.3 g/dL (ref 13.2–17.1)
Lymphs Abs: 1728 cells/uL (ref 850–3900)
MCH: 29.8 pg (ref 27.0–33.0)
MCHC: 34.7 g/dL (ref 32.0–36.0)
MCV: 85.8 fL (ref 80.0–100.0)
MONOS PCT: 9.7 %
MPV: 10.6 fL (ref 7.5–12.5)
NEUTROS ABS: 2894 {cells}/uL (ref 1500–7800)
Neutrophils Relative %: 53.6 %
Platelets: 212 10*3/uL (ref 140–400)
RBC: 5.14 10*6/uL (ref 4.20–5.80)
RDW: 12.3 % (ref 11.0–15.0)
Total Lymphocyte: 32 %
WBC mixed population: 524 cells/uL (ref 200–950)
WBC: 5.4 10*3/uL (ref 3.8–10.8)

## 2017-07-22 LAB — HEMOGLOBIN A1C
HEMOGLOBIN A1C: 5.5 %{Hb} (ref <5.7)
MEAN PLASMA GLUCOSE: 111 (calc)
eAG (mmol/L): 6.2 (calc)

## 2017-07-22 LAB — INSULIN, RANDOM: Insulin: 61.7 u[IU]/mL — ABNORMAL HIGH (ref 2.0–19.6)

## 2017-07-22 LAB — COMPLETE METABOLIC PANEL WITH GFR
AG RATIO: 2 (calc) (ref 1.0–2.5)
ALBUMIN MSPROF: 4.4 g/dL (ref 3.6–5.1)
ALT: 30 U/L (ref 9–46)
AST: 22 U/L (ref 10–35)
Alkaline phosphatase (APISO): 61 U/L (ref 40–115)
BUN: 11 mg/dL (ref 7–25)
CALCIUM: 9.2 mg/dL (ref 8.6–10.3)
CO2: 27 mmol/L (ref 20–32)
CREATININE: 1.01 mg/dL (ref 0.70–1.25)
Chloride: 104 mmol/L (ref 98–110)
GFR, EST NON AFRICAN AMERICAN: 77 mL/min/{1.73_m2} (ref >=60)
GFR, Est African American: 89 mL/min/{1.73_m2} (ref >=60)
GLOBULIN: 2.2 g/dL (ref 1.9–3.7)
Glucose, Bld: 100 mg/dL — ABNORMAL HIGH (ref 65–99)
POTASSIUM: 4 mmol/L (ref 3.5–5.3)
SODIUM: 139 mmol/L (ref 135–146)
Total Bilirubin: 0.5 mg/dL (ref 0.2–1.2)
Total Protein: 6.6 g/dL (ref 6.1–8.1)

## 2017-07-22 LAB — LIPID PANEL
CHOLESTEROL: 159 mg/dL (ref <200)
HDL: 39 mg/dL — AB (ref >40)
NON-HDL CHOLESTEROL (CALC): 120 mg/dL (ref <130)
Total CHOL/HDL Ratio: 4.1 (calc) (ref <5.0)
Triglycerides: 525 mg/dL — ABNORMAL HIGH (ref <150)

## 2017-07-22 LAB — VITAMIN D 25 HYDROXY (VIT D DEFICIENCY, FRACTURES): VIT D 25 HYDROXY: 44 ng/mL (ref 30–100)

## 2017-07-22 LAB — TSH: TSH: 1.59 mIU/L (ref 0.40–4.50)

## 2017-07-22 LAB — MAGNESIUM: Magnesium: 2 mg/dL (ref 1.5–2.5)

## 2017-07-24 ENCOUNTER — Ambulatory Visit: Payer: Self-pay | Admitting: Internal Medicine

## 2017-08-02 ENCOUNTER — Other Ambulatory Visit: Payer: Self-pay | Admitting: Internal Medicine

## 2017-08-02 ENCOUNTER — Other Ambulatory Visit: Payer: Self-pay | Admitting: Physician Assistant

## 2017-08-15 ENCOUNTER — Other Ambulatory Visit: Payer: Self-pay | Admitting: Internal Medicine

## 2017-08-17 ENCOUNTER — Ambulatory Visit: Payer: Self-pay | Admitting: Adult Health

## 2017-08-17 ENCOUNTER — Other Ambulatory Visit: Payer: Self-pay | Admitting: Diagnostic Neuroimaging

## 2017-08-21 ENCOUNTER — Other Ambulatory Visit: Payer: Self-pay | Admitting: Adult Health

## 2017-08-21 NOTE — Addendum Note (Signed)
Addended by: Izora Ribas on: 08/21/2017 01:19 PM   Modules accepted: Orders

## 2017-09-02 ENCOUNTER — Ambulatory Visit
Admission: RE | Admit: 2017-09-02 | Discharge: 2017-09-02 | Disposition: A | Discharge status: Home / Self Care | Payer: Medicare Other | Source: Ambulatory Visit | Attending: Adult Health | Admitting: Adult Health

## 2017-09-02 DIAGNOSIS — M48061 Spinal stenosis, lumbar region without neurogenic claudication: Secondary | ICD-10-CM | POA: Diagnosis not present

## 2017-09-02 DIAGNOSIS — M542 Cervicalgia: Secondary | ICD-10-CM | POA: Diagnosis not present

## 2017-09-02 DIAGNOSIS — M5416 Radiculopathy, lumbar region: Secondary | ICD-10-CM

## 2017-09-02 DIAGNOSIS — M5412 Radiculopathy, cervical region: Secondary | ICD-10-CM

## 2017-09-03 DIAGNOSIS — I10 Essential (primary) hypertension: Secondary | ICD-10-CM | POA: Diagnosis not present

## 2017-09-03 DIAGNOSIS — Z6830 Body mass index (BMI) 30.0-30.9, adult: Secondary | ICD-10-CM | POA: Diagnosis not present

## 2017-09-03 DIAGNOSIS — M48062 Spinal stenosis, lumbar region with neurogenic claudication: Secondary | ICD-10-CM | POA: Diagnosis not present

## 2017-09-04 ENCOUNTER — Other Ambulatory Visit: Payer: Self-pay | Admitting: Internal Medicine

## 2017-09-16 ENCOUNTER — Other Ambulatory Visit: Payer: Self-pay | Admitting: Physician Assistant

## 2017-09-16 ENCOUNTER — Other Ambulatory Visit: Payer: Self-pay | Admitting: Internal Medicine

## 2017-09-16 DIAGNOSIS — G2581 Restless legs syndrome: Secondary | ICD-10-CM

## 2017-09-18 DIAGNOSIS — M48062 Spinal stenosis, lumbar region with neurogenic claudication: Secondary | ICD-10-CM | POA: Diagnosis not present

## 2017-09-18 DIAGNOSIS — M5416 Radiculopathy, lumbar region: Secondary | ICD-10-CM | POA: Diagnosis not present

## 2017-09-18 DIAGNOSIS — M4726 Other spondylosis with radiculopathy, lumbar region: Secondary | ICD-10-CM | POA: Diagnosis not present

## 2017-09-23 IMAGING — CT CT PELVIS W/O CM
2 of 3 series · 11 of 36 positions shown, 18 images · non-contrast
Comparison: MR pelvis on left hip October 30, 2015

CLINICAL DATA: Left-sided pelvic pain

EXAM:
CT PELVIS WITHOUT CONTRAST
TECHNIQUE: Multidetector CT imaging of the pelvis was performed following the
standard protocol without intravenous contrast.

[Series 4: pelvis soft · axial · 0.85mm/px · z∈[-199,+6]mm · 10 of 102 slices shown, 16 images]
[im 10/102  soft-tissue]
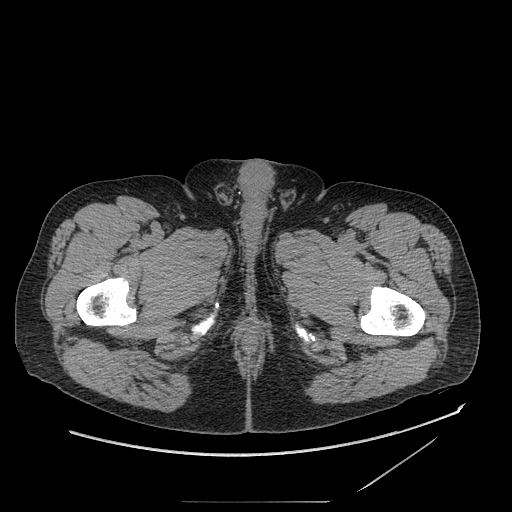
[im 10/102  bone]
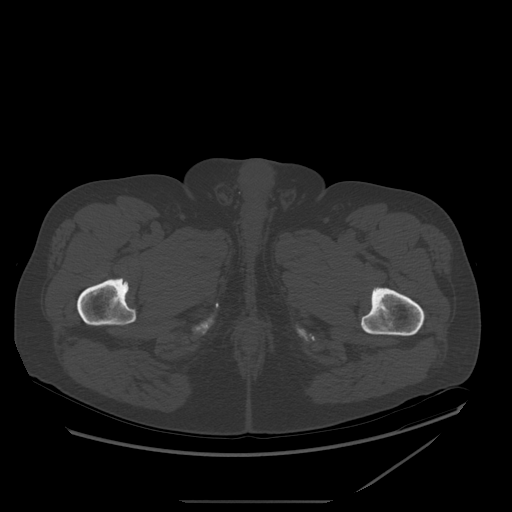
[im 19/102  soft-tissue]
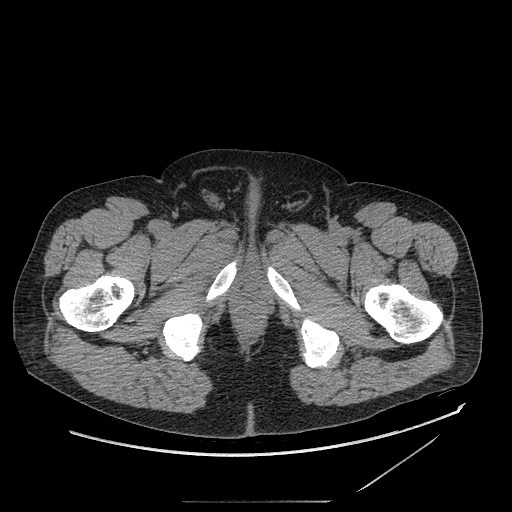
[im 28/102  soft-tissue]
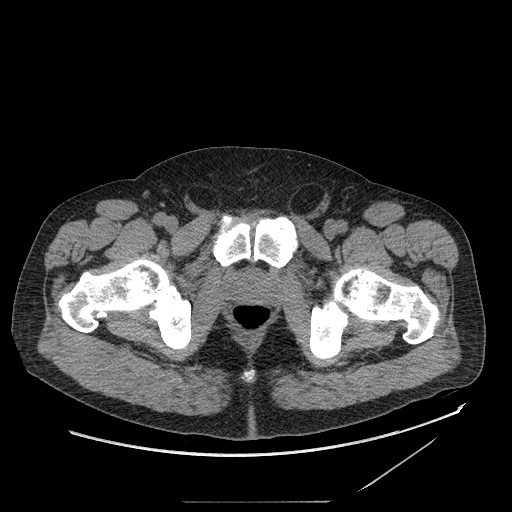
[im 37/102  soft-tissue]
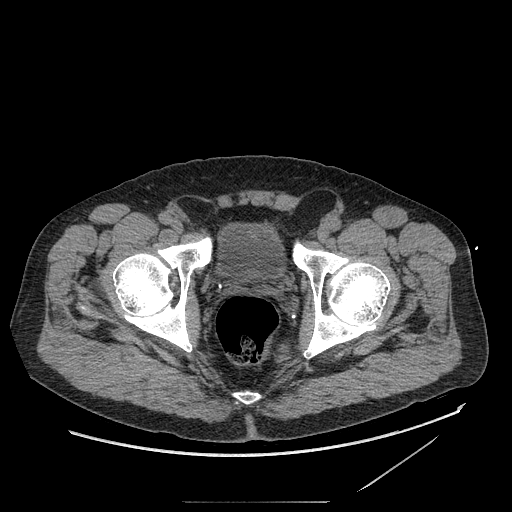
[im 46/102  soft-tissue]
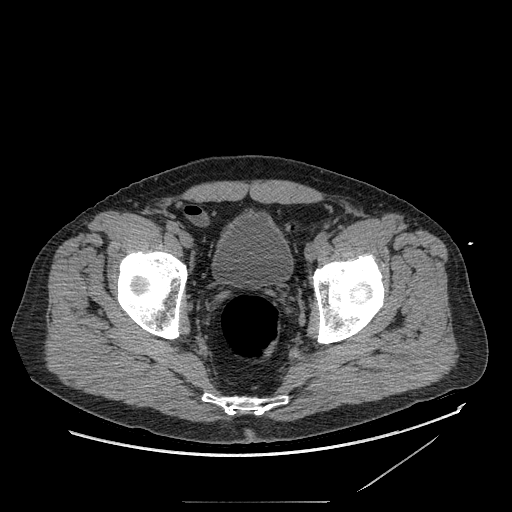
[im 56/102  soft-tissue]
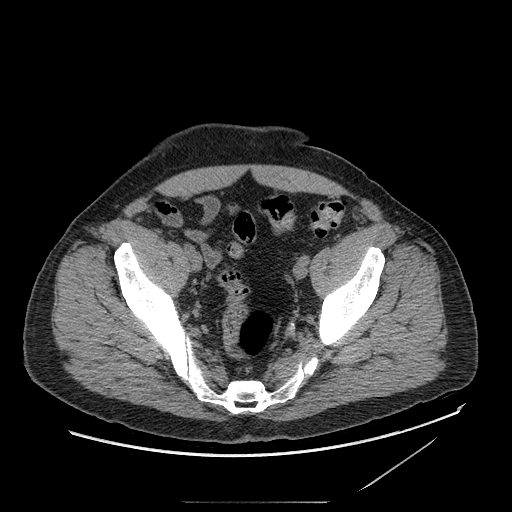
[im 65/102  soft-tissue]
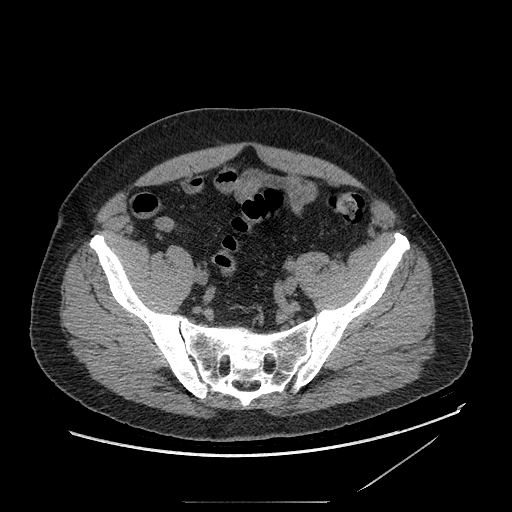
[im 65/102  lung]
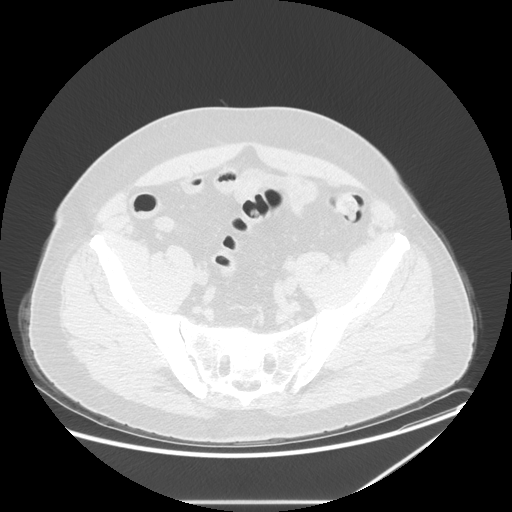
[im 74/102  soft-tissue]
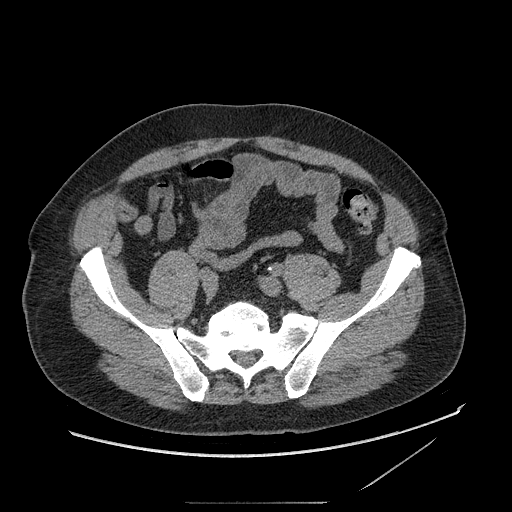
[im 74/102  lung]
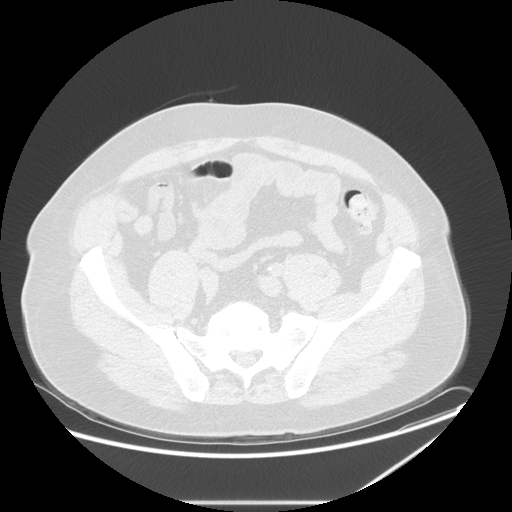
[im 83/102  soft-tissue]
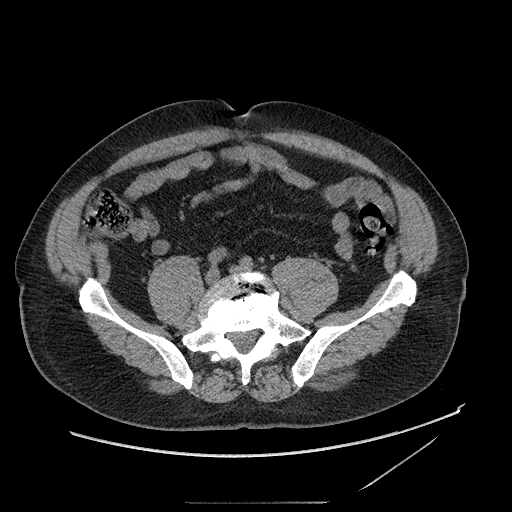
[im 83/102  lung]
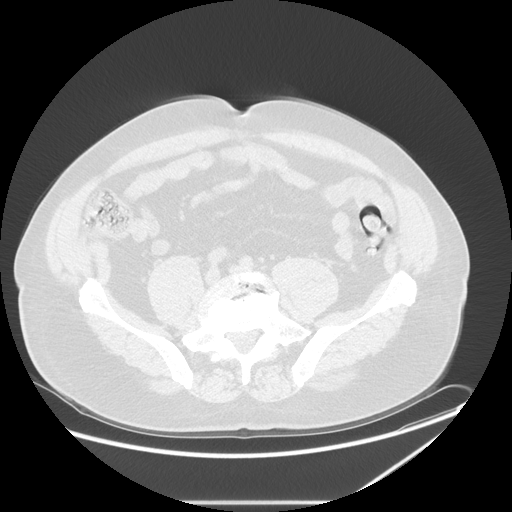
[im 83/102  bone]
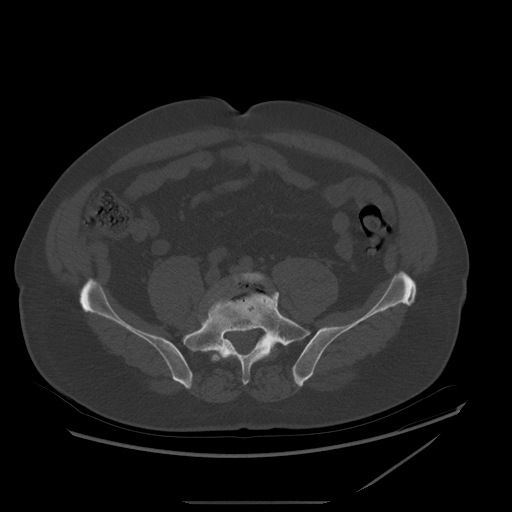
[im 92/102  soft-tissue]
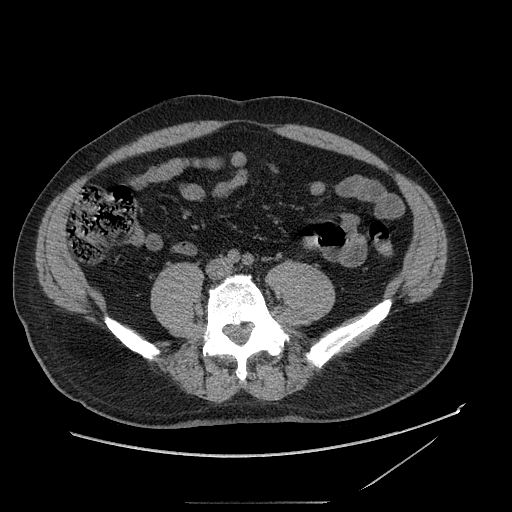
[im 92/102  lung]
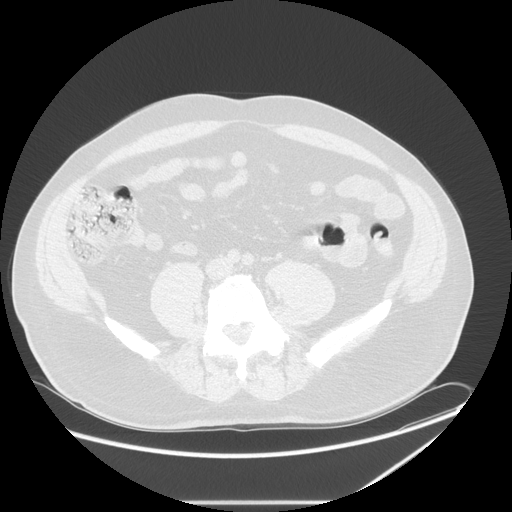

[Series 604: coronal body · coronal · 0.85mm/px · 1 of 124 slices shown, 2 images]
[im 42/124  soft-tissue]
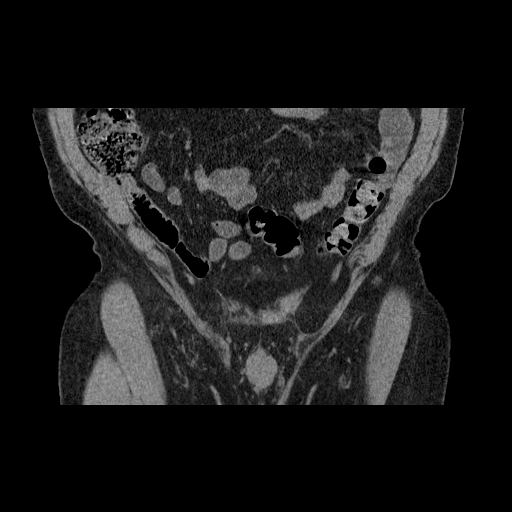
[im 42/124  bone]
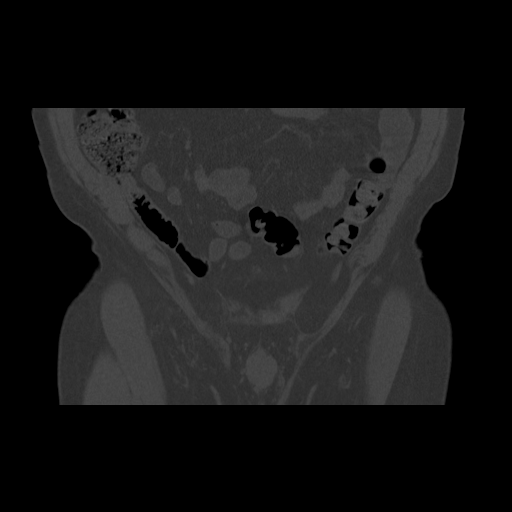

[11 of 36 positions shown; findings below may reference images not displayed]

FINDINGS: Urinary Tract: Urinary bladder is midline. No distal ureteral
calculi or ureterectasis.

Bowel: Rectum is mildly distended with air. No bowel wall thickening
or mesenteric thickening noted in the pelvis. No evident bowel
obstruction. No free air.

Vascular/Lymphatic: There are scattered foci of atherosclerotic
calcification in the right internal iliac and left external iliac
artery regions. No aneurysm noted in major pelvic arterial vessels.
No pelvic adenopathy is evident.

Reproductive: There are several prostatic calculi. No enlargement of
the prostate or seminal vesicles is seen. No pelvic mass evident.

Other: There is fat in each inguinal ring. There is a small ventral
hernia containing only fat. Appendix absent. No abscess or ascites
is present in the abdomen or pelvis.

Musculoskeletal: There is osteoarthritic change in the visualized
lumbar spine as well as narrowing of both hip joints. There is no
fracture or dislocation. No blastic or lytic bone lesions. No
intramuscular or pelvic wall lesions are evident.
IMPRESSION: No fracture or dislocation. There is mild symmetric narrowing of
both hip joints. There is degenerative change in the lower lumbar
spine. Note that prior MR of the hip showed extensive cartilaginous
abnormality in the left hip joint with reactive marrow edema in the
proximal left femur. These are findings that would not be expected
to be seen by CT but may well be persistent since prior MR. It may
be reasonable to consider follow-up with MRI of the pelvis and hips
given previous abnormality in this area.

Fat is noted in each inguinal ring. There is a small ventral hernia
containing only fat.

There are several prostatic calculi. Prostate does not appear
enlarged. Urinary bladder wall is not thickened.

Visualized bowel appears unremarkable. No demonstrable bowel
obstruction. No abscess. No ascites. No adenopathy.

## 2017-09-24 ENCOUNTER — Other Ambulatory Visit: Payer: Self-pay | Admitting: Neurology

## 2017-09-25 ENCOUNTER — Other Ambulatory Visit: Payer: Self-pay | Admitting: Neurology

## 2017-09-25 MED ORDER — ZOLPIDEM TARTRATE 10 MG PO TABS: 5.0000 mg | ORAL_TABLET | Freq: Every evening | ORAL | 0 refills | Status: DC | PRN

## 2017-09-29 ENCOUNTER — Other Ambulatory Visit: Payer: Self-pay | Admitting: Internal Medicine

## 2017-10-17 ENCOUNTER — Other Ambulatory Visit: Payer: Self-pay | Admitting: Adult Health

## 2017-10-17 DIAGNOSIS — I10 Essential (primary) hypertension: Secondary | ICD-10-CM

## 2017-10-22 NOTE — Progress Notes (Signed)
FOLLOW UP  Assessment and Plan:   CAD Control blood pressure, cholesterol, glucose, increase exercise.  Followed by cardiology  Hypertension Well controlled with current medications after recheck, continue to monitor at home Monitor blood pressure at home; patient to call if consistently greater than 130/80 Continue DASH diet.   Reminder to go to the ER if any CP, SOB, nausea, dizziness, severe HA, changes vision/speech, left arm numbness and tingling and jaw pain.  Cholesterol LDL NOT AT GOAL OF LESS THAN 70, MAY INCREASE CRESTOR TO HELP BOTH LDL AND TRIGS Continue low cholesterol diet and exercise.  Check lipid panel.   Other abnormal glucose Continue diet and exercise.  Perform daily foot/skin check, notify office of any concerning changes.  Check A1C, insulin levels  Obesity with co morbidities Long discussion about weight loss, diet, and exercise Recommended diet heavy in fruits and veggies and low in animal meats, cheeses, and dairy products, appropriate calorie intake Discussed ideal weight for height Will follow up in 3 months  Fatigue Check testosterone and labs  Vitamin D Def Below goal at last visit; continue supplementation to maintain goal of 70-100 Check Vit D level    Continue diet and meds as discussed. Further disposition pending results of labs. Discussed med's effects and SE's.   Over 30 minutes of exam, counseling, chart review, and critical decision making was performed.   Future Appointments  Date Time Provider Hunter  01/28/2018  9:00 AM [name] Pinto, MD [facility] None  02/08/2018 10:30 AM Dohmeier, Asencion Partridge, MD [facility] None  04/23/2018  9:30 AM [name], NP [facility] None    ----------------------------------------------------------------------------------------------------------------------  HPI 67 y.o. male  presents for 3 month follow up on hypertension, cholesterol, glucose management, obesity and vitamin D  deficiency.   Patient has hx/o ASCAD with ACS/acute Anterior NSTEMI and PCA w/Stent implantation in 2003 . In 2004 , he had an Ablation for pAfib. Stress Myoview was negative in 2010.   BMI is Body mass index is 30.71 kg/m., he has not been working on diet and admits to limited exercise due to back and hip pain.  Wt Readings from Last 3 Encounters:  10/27/17 214 lb (97.1 kg)  07/21/17 218 lb (98.9 kg)  06/04/17 213 lb (96.6 kg)   His blood pressure has been controlled at home, he states it is good at home, he was rushing here and had a large coffee, today their BP is BP: (!) 140/100, recheck 130/70  He does not workout. He denies chest pain, shortness of breath, dizziness.   He is on cholesterol medication (rosuvastatin 10 mg daily, zetia 10 mg daily) and denies myalgias. His LDL cholesterol is at goal. Trigs remain elevated despite omega 3 supplement. The cholesterol last visit was:   Lab Results  Component Value Date   CHOL 159 07/21/2017   HDL 39 (L) 07/21/2017   [place]  07/21/2017     Comment:     . LDL cholesterol not calculated. Triglyceride levels greater than 400 mg/dL invalidate calculated LDL results. . Reference range: <100 . Desirable range <100 mg/dL for primary prevention;   <70 mg/dL for patients with CHD or diabetic patients  with > or = 2 CHD risk factors. Marland Kitchen LDL-C is now calculated using the Martin-Hopkins  calculation, which is a validated novel method providing  better accuracy than the Friedewald equation in the  estimation of LDL-C.  Cresenciano Genre et al. Annamaria Helling. 4742;595(63): 2061-2068  (http://education.[employer].com/faq/FAQ164)    TRIG 525 (H) 07/21/2017   CHOLHDL  4.1 07/21/2017    He has not been working on diet and exercise for glucose management, and denies foot ulcerations, increased appetite, nausea, paresthesia of the feet, polydipsia, polyuria, visual disturbances, vomiting and weight loss. Last A1C in the office was:  Lab Results  Component  Value Date   HGBA1C 5.5 07/21/2017   Patient is on Vitamin D supplement.   Lab Results  Component Value Date   VD25OH 44 07/21/2017     He reports back and hip pain has improved with injection with Dr. Ellene Route and Dr. Berenice Primas.  He had a root canal yesterday.   Current Medications:  Current Outpatient Medications on File Prior to Visit  Medication Sig  . albuterol (VENTOLIN HFA) 108 (90 Base) MCG/ACT inhaler Inhale into the lungs every 6 (six) hours as needed for wheezing or shortness of breath.  . BYSTOLIC 10 MG tablet TAKE 1 TABLET(10 MG) BY MOUTH DAILY  . clopidogrel (PLAVIX) 75 MG tablet take 1 tablet by mouth once daily  . ezetimibe (ZETIA) 10 MG tablet TAKE 1 TABLET BY MOUTH EVERY DAY  . fluconazole (DIFLUCAN) 100 MG tablet TAKE 2 TABLETS(200 MG) BY MOUTH EVERY DAY. CONTINUE UNTIL 2 DAYS AFTER SYMPTOMS RESOLVE  . Omega-3 Fatty Acids (OMEGA 3 PO) Take 1 capsule by mouth daily.  . predniSONE (DELTASONE) 20 MG tablet 2 tablets daily for 3 days, 1 tablet daily for 4 days.  Marland Kitchen rOPINIRole (REQUIP) 1 MG tablet TAKE 1 TO 3 TABLETS BY MOUTH 1 TO 3 HOURS BEFORE SLEEP FOR RESTLESS LEGS  . rosuvastatin (CRESTOR) 10 MG tablet TAKE 1 TABLET BY MOUTH ONCE DAILY  . sertraline (ZOLOFT) 50 MG tablet TAKE 1 TABLET BY MOUTH ONCE DAILY  . tadalafil (CIALIS) 20 MG tablet Take 1/2 to 1 tablet every 2 to 3 days as needed for XXXX  . telmisartan (MICARDIS) 80 MG tablet TAKE 1 TABLET(80 MG) BY MOUTH ONCE A DAY  . zolpidem (AMBIEN) 10 MG tablet Take 0.5-1 tablets (5-10 mg total) by mouth at bedtime as needed. for sleep   No current facility-administered medications on file prior to visit.      Allergies:  Allergies  Allergen Reactions  . Lisinopril Cough    ACEi cough  . Amoxicillin Hives and Swelling  . Lunesta [Eszopiclone]     Restless legs  . Penicillins Swelling     Medical History:  Past Medical History:  Diagnosis Date  . CAD (coronary artery disease), native coronary artery    Cath  04/19/2001  normal Left main, moderate proximal disease, 80% stenosis distal LAD, 99% stenosis proximal OM 1, 60% stenosis mid RCA, 70% stenosis ostial mid PDA  PCI with bare metal stent of OM1 same date 3.5 x 18 mm Multilink    . Diverticulosis   . GERD (gastroesophageal reflux disease)   . Hernia   . History of PSVT    Ablation done in 2004 at [facility]   . Hyperlipidemia   . Hypertension   . Symptomatic PVCs   . Vitamin D deficiency    Family history- Reviewed and unchanged Social history- Reviewed and unchanged   Review of Systems:  Review of Systems  Constitutional: Negative for malaise/fatigue and weight loss.  HENT: Negative for hearing loss and tinnitus.   Eyes: Negative for blurred vision and double vision.  Respiratory: Negative for cough, shortness of breath and wheezing.   Cardiovascular: Negative for chest pain, palpitations, orthopnea, claudication and leg swelling.  Gastrointestinal: Negative for abdominal pain, blood in stool,  constipation, diarrhea, heartburn, melena, nausea and vomiting.  Genitourinary: Negative.   Musculoskeletal: Positive for back pain (Lumbar with radiation to hip), falls and neck pain (At night). Negative for joint pain and myalgias.  Skin: Negative for rash.  Neurological: Positive for sensory change (bilateral upper extremities numb at night with hand cramping). Negative for dizziness, tingling, weakness and headaches.  Endo/Heme/Allergies: Negative for polydipsia.  Psychiatric/Behavioral: Negative.   All other systems reviewed and are negative.   Physical Exam: BP (!) 140/100   Pulse 76   Temp 98.1 F (36.7 C)   Resp 16   Ht 5\' 10"  (1.778 m)   Wt 214 lb (97.1 kg)   SpO2 96%   BMI 30.71 kg/m  Wt Readings from Last 3 Encounters:  10/27/17 214 lb (97.1 kg)  07/21/17 218 lb (98.9 kg)  06/04/17 213 lb (96.6 kg)   General Appearance: Well nourished, in no apparent distress. Eyes: PERRLA, EOMs, conjunctiva no swelling or  erythema Sinuses: No Frontal/maxillary tenderness ENT/Mouth: Ext aud canals clear, TMs without erythema, bulging. No erythema, swelling, or exudate on post pharynx.  Tonsils not swollen or erythematous. Hearing normal.  Neck: Supple, thyroid normal.  Respiratory: Respiratory effort normal, BS equal bilaterally without rales, rhonchi, wheezing or stridor.  Cardio: RRR with no MRGs. Brisk peripheral pulses without edema.  Abdomen: Soft, + BS.  Non tender, no guarding, rebound, hernias, masses. Lymphatics: Non tender without lymphadenopathy.  Musculoskeletal: Full ROM, 5/5 strength, Normal gait. He does have left SI joint tenderness, neg straight leg raise, no spinous tenderness. Hip ROM intact. Phalen's negative.  Skin: Warm, dry without rashes, lesions, ecchymosis.  Neuro: Cranial nerves intact. No cerebellar symptoms.  Psych: Awake and oriented X 3, normal affect, Insight and Judgment appropriate.   Vicie Mutters, PA-C 4:24 PM [facility]

## 2017-10-25 ENCOUNTER — Other Ambulatory Visit: Payer: Self-pay | Admitting: Adult Health

## 2017-10-27 ENCOUNTER — Encounter: Payer: Self-pay | Admitting: Physician Assistant

## 2017-10-27 ENCOUNTER — Ambulatory Visit (INDEPENDENT_AMBULATORY_CARE_PROVIDER_SITE_OTHER): Payer: Medicare Other | Admitting: Physician Assistant

## 2017-10-27 VITALS — BP 130/70 | HR 76 | Temp 98.1°F | Resp 16 | Ht 70.0 in | Wt 214.0 lb

## 2017-10-27 DIAGNOSIS — R5383 Other fatigue: Secondary | ICD-10-CM | POA: Diagnosis not present

## 2017-10-27 DIAGNOSIS — E782 Mixed hyperlipidemia: Secondary | ICD-10-CM | POA: Diagnosis not present

## 2017-10-27 DIAGNOSIS — Z23 Encounter for immunization: Secondary | ICD-10-CM | POA: Diagnosis not present

## 2017-10-27 DIAGNOSIS — I2510 Atherosclerotic heart disease of native coronary artery without angina pectoris: Secondary | ICD-10-CM | POA: Diagnosis not present

## 2017-10-27 DIAGNOSIS — R7309 Other abnormal glucose: Secondary | ICD-10-CM

## 2017-10-27 DIAGNOSIS — I10 Essential (primary) hypertension: Secondary | ICD-10-CM | POA: Diagnosis not present

## 2017-10-27 DIAGNOSIS — N182 Chronic kidney disease, stage 2 (mild): Secondary | ICD-10-CM

## 2017-10-27 DIAGNOSIS — Z79899 Other long term (current) drug therapy: Secondary | ICD-10-CM | POA: Diagnosis not present

## 2017-10-27 MED ORDER — LIDOCAINE HCL URETHRAL/MUCOSAL 2 % EX GEL: CUTANEOUS | 0 refills | Status: DC

## 2017-10-27 NOTE — Patient Instructions (Addendum)
HYPERTENSION INFORMATION  Monitor your blood pressure at home, please keep a record and bring that in with you to your next office visit.   Go to the ER if any CP, SOB, nausea, dizziness, severe HA, changes vision/speech  Due to a recent study, SPRINT, we have changed our goal for the systolic or top blood pressure number. Ideally we want your top number at 120.  In the [facility] Trial, 5000 people were randomized to a goal BP of 120 and 5000 people were randomized to a goal BP of less than 140. The patients with the goal BP at 120 had LESS DEMENTIA, LESS HEART ATTACKS, AND LESS STROKES, AS WELL AS OVERALL DECREASED MORTALITY OR DEATH RATE.   If you are willing, our goal BP is the top number of 120.  Your most recent BP: BP: (!) 140/100   Take your medications faithfully as instructed. Maintain a healthy weight. Get at least 150 minutes of aerobic exercise per week. Minimize salt intake. Minimize alcohol intake  DASH Eating Plan DASH stands for "Dietary Approaches to Stop Hypertension." The DASH eating plan is a healthy eating plan that has been shown to reduce high blood pressure (hypertension). Additional health benefits may include reducing the risk of type 2 diabetes mellitus, heart disease, and stroke. The DASH eating plan may also help with weight loss. WHAT DO I NEED TO KNOW ABOUT THE DASH EATING PLAN? For the DASH eating plan, you will follow these general guidelines:  Choose foods with a percent daily value for sodium of less than 5% (as listed on the food label).  Use salt-free seasonings or herbs instead of table salt or sea salt.  Check with your health care provider or pharmacist before using salt substitutes.  Eat lower-sodium products, often labeled as "lower sodium" or "no salt added."  Eat fresh foods.  Eat more vegetables, fruits, and low-fat dairy products.  Choose whole grains. Look for the word "whole" as the first word in the ingredient list.  Choose fish and  skinless chicken or [place] more often than red meat. Limit fish, poultry, and meat to 6 oz (170 g) each day.  Limit sweets, desserts, sugars, and sugary drinks.  Choose heart-healthy fats.  Limit cheese to 1 oz (28 g) per day.  Eat more home-cooked food and less restaurant, buffet, and fast food.  Limit fried foods.  Cook foods using methods other than frying.  Limit canned vegetables. If you do use them, rinse them well to decrease the sodium.  When eating at a restaurant, ask that your food be prepared with less salt, or no salt if possible. WHAT FOODS CAN I EAT? Seek help from a dietitian for individual calorie needs. Grains Whole grain or whole wheat bread. Brown rice. Whole grain or whole wheat pasta. Quinoa, bulgur, and whole grain cereals. Low-sodium cereals. Corn or whole wheat flour tortillas. Whole grain cornbread. Whole grain crackers. Low-sodium crackers. Vegetables Fresh or frozen vegetables (raw, steamed, roasted, or grilled). Low-sodium or reduced-sodium tomato and vegetable juices. Low-sodium or reduced-sodium tomato sauce and paste. Low-sodium or reduced-sodium canned vegetables.  Fruits All fresh, canned (in natural juice), or frozen fruits. Meat and Other Protein Products Ground beef (85% or leaner), grass-fed beef, or beef trimmed of fat. Skinless chicken or [place]. Ground chicken or [place]. Pork trimmed of fat. All fish and seafood. Eggs. Dried beans, peas, or lentils. Unsalted nuts and seeds. Unsalted canned beans. Dairy Low-fat dairy products, such as skim or 1% milk, 2% or reduced-fat  cheeses, low-fat ricotta or cottage cheese, or plain low-fat yogurt. Low-sodium or reduced-sodium cheeses. Fats and Oils Tub margarines without trans fats. Light or reduced-fat mayonnaise and salad dressings (reduced sodium). Avocado. Safflower, olive, or canola oils. Natural peanut or almond butter. Other Unsalted popcorn and pretzels. The items listed above may not be a  complete list of recommended foods or beverages. Contact your dietitian for more options. WHAT FOODS ARE NOT RECOMMENDED? Grains White bread. White pasta. White rice. Refined cornbread. Bagels and croissants. Crackers that contain trans fat. Vegetables Creamed or fried vegetables. Vegetables in a cheese sauce. Regular canned vegetables. Regular canned tomato sauce and paste. Regular tomato and vegetable juices. Fruits Dried fruits. Canned fruit in light or heavy syrup. Fruit juice. Meat and Other Protein Products Fatty cuts of meat. Ribs, chicken wings, bacon, sausage, bologna, salami, chitterlings, fatback, hot dogs, bratwurst, and packaged luncheon meats. Salted nuts and seeds. Canned beans with salt. Dairy Whole or 2% milk, cream, half-and-half, and cream cheese. Whole-fat or sweetened yogurt. Full-fat cheeses or blue cheese. Nondairy creamers and whipped toppings. Processed cheese, cheese spreads, or cheese curds. Condiments Onion and garlic salt, seasoned salt, table salt, and sea salt. Canned and packaged gravies. Worcestershire sauce. Tartar sauce. Barbecue sauce. Teriyaki sauce. Soy sauce, including reduced sodium. Steak sauce. Fish sauce. Oyster sauce. Cocktail sauce. Horseradish. Ketchup and mustard. Meat flavorings and tenderizers. Bouillon cubes. Hot sauce. Tabasco sauce. Marinades. Taco seasonings. Relishes. Fats and Oils Butter, stick margarine, lard, shortening, ghee, and bacon fat. Coconut, palm kernel, or palm oils. Regular salad dressings. Other Pickles and olives. Salted popcorn and pretzels. The items listed above may not be a complete list of foods and beverages to avoid. Contact your dietitian for more information. WHERE CAN I FIND MORE INFORMATION? National Heart, Lung, and Blood Institute: travelstabloid.com Document Released: 12/12/2010 Document Revised: 05/09/2013 Document Reviewed: 10/27/2012 [facility] Patient Information 2015  [place], [place]. This information is not intended to replace advice given to you by your health care provider. Make sure you discuss any questions you have with your health care provider.   Can do zinc 40-50 mg a day Can try shake that is whey protein, almond milk, avocado oil, and spinach and strawberries in the morning  9 Ways to Naturally Increase Testosterone Levels  1.   Lose Weight If you're overweight, shedding the excess pounds may increase your testosterone levels, according to research presented at the Endocrine Society's 2012 meeting. Overweight men are more likely to have low testosterone levels to begin with, so this is an important trick to increase your body's testosterone production when you need it most.  2.   High-Intensity Exercise like Peak Fitness  Short intense exercise has a proven positive effect on increasing testosterone levels and preventing its decline. That's unlike aerobics or prolonged moderate exercise, which have shown to have negative or no effect on testosterone levels. Having a whey protein meal after exercise can further enhance the satiety/testosterone-boosting impact (hunger hormones cause the opposite effect on your testosterone and libido). Here's a summary of what a typical high-intensity Peak Fitness routine might look like: " Warm up for three minutes  " Exercise as hard and fast as you can for 30 seconds. You should feel like you couldn't possibly go on another few seconds  " Recover at a slow to moderate pace for 90 seconds  " Repeat the high intensity exercise and recovery 7 more times .  3.   Consume Plenty of Zinc The mineral zinc is important  for testosterone production, and supplementing your diet for as little as six weeks has been shown to cause a marked improvement in testosterone among men with low levels.1 Likewise, research has shown that restricting dietary sources of zinc leads to a significant decrease in testosterone, while zinc  supplementation increases it2 -- and even protects men from exercised-induced reductions in testosterone levels.3 It's estimated that up to 17 percent of adults over the age of 60 may have lower than recommended zinc intakes; even when dietary supplements were added in, an estimated 20-25 percent of older adults still had inadequate zinc intakes, according to a [employer] and Nutrition Examination Survey.4 Your diet is the best source of zinc; along with protein-rich foods like meats and fish, other good dietary sources of zinc include raw milk, raw cheese, beans, and yogurt or kefir made from raw milk. It can be difficult to obtain enough dietary zinc if you're a vegetarian, and also for meat-eaters as well, largely because of conventional farming methods that rely heavily on chemical fertilizers and pesticides. These chemicals deplete the soil of nutrients ... nutrients like zinc that must be absorbed by plants in order to be passed on to you. In many cases, you may further deplete the nutrients in your food by the way you prepare it. For most food, cooking it will drastically reduce its levels of nutrients like zinc ... particularly over-cooking, which many people do. If you decide to use a zinc supplement, stick to a dosage of less than 40 mg a day, as this is the recommended adult upper limit. Taking too much zinc can interfere with your body's ability to absorb other minerals, especially copper, and may cause nausea as a side effect.  4.   Strength Training In addition to Peak Fitness, strength training is also known to boost testosterone levels, provided you are doing so intensely enough. When strength training to boost testosterone, you'll want to increase the weight and lower your number of reps, and then focus on exercises that work a large number of muscles, such as dead lifts or squats.  You can "turbo-charge" your weight training by going slower. By slowing down your movement, you're  actually turning it into a high-intensity exercise. Super Slow movement allows your muscle, at the microscopic level, to access the maximum number of cross-bridges between the protein filaments that produce movement in the muscle.   5.   Optimize Your Vitamin D Levels Vitamin D, a steroid hormone, is essential for the healthy development of the nucleus of the sperm cell, and helps maintain semen quality and sperm count. Vitamin D also increases levels of testosterone, which may boost libido. In one study, overweight men who were given vitamin D supplements had a significant increase in testosterone levels after one year.5   6.   Reduce Stress When you're under a lot of stress, your body releases high levels of the stress hormone cortisol. This hormone actually blocks the effects of testosterone,6 presumably because, from a biological standpoint, testosterone-associated behaviors (mating, competing, aggression) may have lowered your chances of survival in an emergency (hence, the "fight or flight" response is dominant, courtesy of cortisol).  7.   Limit or Eliminate Sugar from Your Diet Testosterone levels decrease after you eat sugar, which is likely because the sugar leads to a high insulin level, another factor leading to low testosterone.7 Based on USDA estimates, the average American consumes 12 teaspoons of sugar a day, which equates to about TWO TONS of sugar during  a lifetime.  8.   Eat Healthy Fats By healthy, this means not only mon- and polyunsaturated fats, like that found in avocadoes and nuts, but also saturated, as these are essential for building testosterone. Research shows that a diet with less than 40 percent of energy as fat (and that mainly from animal sources, i.e. saturated) lead to a decrease in testosterone levels.8 My personal diet is about 60-70 percent healthy fat, and other experts agree that the ideal diet includes somewhere between 50-70 percent fat.  It's important to  understand that your body requires saturated fats from animal and vegetable sources (such as meat, dairy, certain oils, and tropical plants like coconut) for optimal functioning, and if you neglect this important food group in favor of sugar, grains and other starchy carbs, your health and weight are almost guaranteed to suffer. Examples of healthy fats you can eat more of to give your testosterone levels a boost include: Olives and Olive oil  Coconuts and coconut oil Butter made from raw grass-fed organic milk Raw nuts, such as, almonds or pecans Organic pastured egg yolks Avocados Grass-fed meats Palm oil Unheated organic nut oils   9.   Boost Your Intake of Branch Chain Amino Acids (BCAA) from Foods Like [place] suggests that BCAAs result in higher testosterone levels, particularly when taken along with resistance training.9 While BCAAs are available in supplement form, you'll find the highest concentrations of BCAAs like leucine in dairy products - especially quality cheeses and whey protein. Even when getting leucine from your natural food supply, it's often wasted or used as a building block instead of an anabolic agent. So to create the correct anabolic environment, you need to boost leucine consumption way beyond mere maintenance levels. That said, keep in mind that using leucine as a free form amino acid can be highly counterproductive as when free form amino acids are artificially administrated, they rapidly enter your circulation while disrupting insulin function, and impairing your body's glycemic control. Food-based leucine is really the ideal form that can benefit your muscles without side effects.

## 2017-10-28 LAB — HEMOGLOBIN A1C
Hgb A1c MFr Bld: 5.8 % of total Hgb — ABNORMAL HIGH (ref <5.7)
Mean Plasma Glucose: 120 (calc)
eAG (mmol/L): 6.6 (calc)

## 2017-10-28 LAB — CBC WITH DIFFERENTIAL/PLATELET
BASOS PCT: 0.3 %
Basophils Absolute: 36 cells/uL (ref 0–200)
EOS ABS: 48 {cells}/uL (ref 15–500)
Eosinophils Relative: 0.4 %
HCT: 47.1 % (ref 38.5–50.0)
Hemoglobin: 16.2 g/dL (ref 13.2–17.1)
Lymphs Abs: 2273 cells/uL (ref 850–3900)
MCH: 29.8 pg (ref 27.0–33.0)
MCHC: 34.4 g/dL (ref 32.0–36.0)
MCV: 86.7 fL (ref 80.0–100.0)
MONOS PCT: 6.4 %
MPV: 10.2 fL (ref 7.5–12.5)
NEUTROS ABS: 8782 {cells}/uL — AB (ref 1500–7800)
Neutrophils Relative %: 73.8 %
PLATELETS: 266 10*3/uL (ref 140–400)
RBC: 5.43 10*6/uL (ref 4.20–5.80)
RDW: 12 % (ref 11.0–15.0)
TOTAL LYMPHOCYTE: 19.1 %
WBC: 11.9 10*3/uL — ABNORMAL HIGH (ref 3.8–10.8)
WBCMIX: 762 {cells}/uL (ref 200–950)

## 2017-10-28 LAB — LIPID PANEL
CHOL/HDL RATIO: 2.9 (calc) (ref <5.0)
Cholesterol: 176 mg/dL (ref <200)
HDL: 61 mg/dL (ref >40)
LDL CHOLESTEROL (CALC): 82 mg/dL
Non-HDL Cholesterol (Calc): 115 mg/dL (calc) (ref <130)
Triglycerides: 237 mg/dL — ABNORMAL HIGH (ref <150)

## 2017-10-28 LAB — COMPLETE METABOLIC PANEL WITH GFR
AG Ratio: 2 (calc) (ref 1.0–2.5)
ALBUMIN MSPROF: 4.7 g/dL (ref 3.6–5.1)
ALT: 36 U/L (ref 9–46)
AST: 28 U/L (ref 10–35)
Alkaline phosphatase (APISO): 63 U/L (ref 40–115)
BUN: 13 mg/dL (ref 7–25)
CALCIUM: 9.8 mg/dL (ref 8.6–10.3)
CO2: 26 mmol/L (ref 20–32)
CREATININE: 1.06 mg/dL (ref 0.70–1.25)
Chloride: 100 mmol/L (ref 98–110)
GFR, EST NON AFRICAN AMERICAN: 72 mL/min/{1.73_m2} (ref >=60)
GFR, Est African American: 84 mL/min/{1.73_m2} (ref >=60)
GLUCOSE: 81 mg/dL (ref 65–99)
Globulin: 2.3 g/dL (calc) (ref 1.9–3.7)
Potassium: 4.2 mmol/L (ref 3.5–5.3)
SODIUM: 138 mmol/L (ref 135–146)
TOTAL PROTEIN: 7 g/dL (ref 6.1–8.1)
Total Bilirubin: 0.8 mg/dL (ref 0.2–1.2)

## 2017-10-28 LAB — MAGNESIUM: MAGNESIUM: 2 mg/dL (ref 1.5–2.5)

## 2017-10-28 LAB — TESTOSTERONE: TESTOSTERONE: 486 ng/dL (ref 250–827)

## 2017-10-28 LAB — TSH: TSH: 1.54 mIU/L (ref 0.40–4.50)

## 2017-10-29 ENCOUNTER — Other Ambulatory Visit: Payer: Self-pay | Admitting: Adult Health

## 2017-11-03 ENCOUNTER — Ambulatory Visit: Payer: Self-pay | Admitting: Physician Assistant

## 2017-11-12 ENCOUNTER — Other Ambulatory Visit: Payer: Self-pay | Admitting: Neurology

## 2017-11-27 ENCOUNTER — Other Ambulatory Visit: Payer: Self-pay | Admitting: Internal Medicine

## 2017-12-10 ENCOUNTER — Other Ambulatory Visit: Payer: Self-pay

## 2017-12-10 DIAGNOSIS — G2581 Restless legs syndrome: Secondary | ICD-10-CM

## 2017-12-10 MED ORDER — ROPINIROLE HCL 1 MG PO TABS: ORAL_TABLET | ORAL | 1 refills | Status: DC

## 2017-12-22 ENCOUNTER — Other Ambulatory Visit: Payer: Self-pay | Admitting: Internal Medicine

## 2018-01-03 ENCOUNTER — Other Ambulatory Visit: Payer: Self-pay | Admitting: Neurology

## 2018-01-22 ENCOUNTER — Encounter: Payer: Self-pay | Admitting: Internal Medicine

## 2018-01-22 ENCOUNTER — Ambulatory Visit (INDEPENDENT_AMBULATORY_CARE_PROVIDER_SITE_OTHER): Payer: Medicare Other | Admitting: Internal Medicine

## 2018-01-22 VITALS — BP 132/78 | HR 81 | Temp 98.1°F | Ht 70.0 in | Wt 223.6 lb

## 2018-01-22 DIAGNOSIS — I471 Supraventricular tachycardia: Secondary | ICD-10-CM | POA: Diagnosis not present

## 2018-01-22 DIAGNOSIS — I10 Essential (primary) hypertension: Secondary | ICD-10-CM

## 2018-01-22 DIAGNOSIS — L820 Inflamed seborrheic keratosis: Secondary | ICD-10-CM | POA: Diagnosis not present

## 2018-01-22 DIAGNOSIS — Z6834 Body mass index (BMI) 34.0-34.9, adult: Secondary | ICD-10-CM

## 2018-01-22 DIAGNOSIS — E6609 Other obesity due to excess calories: Secondary | ICD-10-CM | POA: Diagnosis not present

## 2018-01-22 MED ORDER — TOPIRAMATE 50 MG PO TABS: ORAL_TABLET | ORAL | 1 refills | Status: DC

## 2018-01-22 MED ORDER — PHENTERMINE HCL 37.5 MG PO TABS: ORAL_TABLET | ORAL | 5 refills | Status: DC

## 2018-01-22 NOTE — Progress Notes (Signed)
Subjective:    Patient ID: Bryan [name], male    DOB: [birth year], 68 y.o.   MRN: [id number]  HPI       This very nice 68 yo [name] presents for BP follow -up. He denies any c/o HA's, dizziness, CP, palpitations, dyspnea or dependent edema. Patient also expresses about an unintended 9# weight gain over the 2-3 months despite his best efforts at weight loss. He expresses interest in meds to help "jumpstart " weight loss. Lastly, he has concerns re: numerous pruritic raised scaly lesions of his posterior trunk - a number of which are scabbed.                                                                  Wt Readings from Last 3 Encounters:  01/22/18 223 lb 9.6 oz (101.4 kg)  10/27/17 214 lb (97.1 kg)  07/21/17 218 lb (98.9 kg)   Medication Sig  . albuterol HFAnhaler Use 1-2 inhalations every 6 hrs as needed for wheezing  . BYSTOLIC 10 MG tablet TAKE 1 TABLET DAILY  . clopidogrel 75 MG tablet take 1 tablet  once daily  . ezetimibe  10 MG tablet TAKE 1 TABLET  EVERY DAY  . Omega-3 Fatty Acids Take 1 capsule - daily.  . REQUIP 1 MG tablet Take 1-3 tablets  1-3 hrs before sleep for restless legs  . CRESTOR 10 MG tablet TAKE 1 TABLET  DAILY  . sertraline  50 MG tablet TAKE 1 TABLET  EVERY DAY  . CIALIS 20 MG tablet Take 1/2 to 1 tablet every 2 to 3 days as needed for XXXX  . MICARDIS 80 MG tablet TAKE 1 TABLET ONCE A DAY  . AMBIEN 10 MG tablet TAKE 1/2-1 TABLET AT BEDTIME AS NEEDED FOR SLEEP   Allergies  Allergen Reactions  . Lisinopril Cough    ACEi cough  . Amoxicillin Hives and Swelling  . Lunesta [Eszopiclone]     Restless legs  . Penicillins Swelling   Past Medical History:  Diagnosis Date  . CAD (coronary artery disease), native coronary artery    Cath 04/19/2001  normal Left main, moderate proximal disease, 80% stenosis distal LAD, 99% stenosis proximal OM 1, 60% stenosis mid RCA, 70% stenosis ostial mid PDA  PCI with bare metal stent of OM1 same date 3.5 x 18 mm Multilink    .  Diverticulosis   . GERD (gastroesophageal reflux disease)   . Hernia   . History of PSVT    Ablation done in 2004 at [facility]   . Hyperlipidemia   . Hypertension   . Symptomatic PVCs   . Vitamin D deficiency    Review of Systems   10 point systems review negative except as above.    Objective:   Physical Exam  BP 132/78   Pulse 81   Temp 98.1 F (36.7 C)   Ht 5\' 10"  (1.778 m)   Wt 223 lb 9.6 oz (101.4 kg)   SpO2 97%   BMI 32.08 kg/m   HEENT - WNL. Neck - supple.  Chest - Clear equal BS. Cor - Nl HS. RRR w/o sig MGR. PP 1(+). No edema. MS- FROM w/o deformities.  Gait Nl. Neuro -  Nl w/o focal abnormalities. Skin -  Multiple excoriated lesions of the posterior trunk,               raised light to medium brown &  ranging in size from 10-18 mm cross diameter.  Procedures (CPT - 17000 and CPT 17003 x 10)   After informed consent,  # 15 lesions of the posterior trunk were treated with liquid Nitrogen by double & triple freeze/thaw technique. Patient tolerated the procedure well. Patient was instructed in post-op care.     Assessment & Plan:   1. Essential hypertension  2. Class 1 obesity due to excess calories with serious comorbidity and body mass index (BMI) of 34.0 to 34.9 in adult  - phentermine (ADIPEX-P) 37.5 MG tablet; Take 1/2 to 1 tablet every morning for Dieting & Weight Loss  Dispense: 30 tablet; Refill: 5  - topiramate (TOPAMAX) 50 MG tablet; Take 1 tablet 2 x / day at Suppertime & Bedtime for Dieting & Weight Loss  Dispense: 180 tablet; Refill: 1  3. Seborrheic keratoses, inflamed  - Discussed meds & SE's

## 2018-01-28 ENCOUNTER — Ambulatory Visit (INDEPENDENT_AMBULATORY_CARE_PROVIDER_SITE_OTHER): Payer: Medicare Other | Admitting: Internal Medicine

## 2018-01-28 ENCOUNTER — Encounter: Payer: Self-pay | Admitting: Internal Medicine

## 2018-01-28 VITALS — BP 130/80 | HR 68 | Temp 97.5°F | Ht 70.0 in | Wt 216.8 lb

## 2018-01-28 DIAGNOSIS — N401 Enlarged prostate with lower urinary tract symptoms: Secondary | ICD-10-CM

## 2018-01-28 DIAGNOSIS — N138 Other obstructive and reflux uropathy: Secondary | ICD-10-CM | POA: Diagnosis not present

## 2018-01-28 DIAGNOSIS — Z8249 Family history of ischemic heart disease and other diseases of the circulatory system: Secondary | ICD-10-CM | POA: Diagnosis not present

## 2018-01-28 DIAGNOSIS — Z125 Encounter for screening for malignant neoplasm of prostate: Secondary | ICD-10-CM | POA: Diagnosis not present

## 2018-01-28 DIAGNOSIS — E559 Vitamin D deficiency, unspecified: Secondary | ICD-10-CM

## 2018-01-28 DIAGNOSIS — Z1211 Encounter for screening for malignant neoplasm of colon: Secondary | ICD-10-CM

## 2018-01-28 DIAGNOSIS — Z1212 Encounter for screening for malignant neoplasm of rectum: Secondary | ICD-10-CM

## 2018-01-28 DIAGNOSIS — I2510 Atherosclerotic heart disease of native coronary artery without angina pectoris: Secondary | ICD-10-CM

## 2018-01-28 DIAGNOSIS — E782 Mixed hyperlipidemia: Secondary | ICD-10-CM

## 2018-01-28 DIAGNOSIS — G4733 Obstructive sleep apnea (adult) (pediatric): Secondary | ICD-10-CM

## 2018-01-28 DIAGNOSIS — Z79899 Other long term (current) drug therapy: Secondary | ICD-10-CM

## 2018-01-28 DIAGNOSIS — I10 Essential (primary) hypertension: Secondary | ICD-10-CM

## 2018-01-28 DIAGNOSIS — Z136 Encounter for screening for cardiovascular disorders: Secondary | ICD-10-CM

## 2018-01-28 DIAGNOSIS — R7309 Other abnormal glucose: Secondary | ICD-10-CM | POA: Diagnosis not present

## 2018-01-28 DIAGNOSIS — Z800 Family history of malignant neoplasm of digestive organs: Secondary | ICD-10-CM

## 2018-01-28 DIAGNOSIS — R7303 Prediabetes: Secondary | ICD-10-CM | POA: Diagnosis not present

## 2018-01-28 DIAGNOSIS — N529 Male erectile dysfunction, unspecified: Secondary | ICD-10-CM

## 2018-01-28 MED ORDER — TADALAFIL 20 MG PO TABS: ORAL_TABLET | ORAL | 11 refills | Status: DC

## 2018-01-28 NOTE — Patient Instructions (Signed)

## 2018-01-28 NOTE — Progress Notes (Signed)
[facility]   [name] Pinto, M.D.     [name], [name] [name], [place]                [address] [place], [place] [address] Telephone [phone] Telefax [phone] Comprehensive Evaluation & Examination     This very nice 68 y.o.  [name] presents for a  comprehensive evaluation and management of multiple medical co-morbidities.  Patient has been followed for HTN, HLD, ASCAD/Stents,  Prediabetes and Vitamin D Deficiency.    Patient's mother has hx/o a malignant Colon polyp and patient was due 5 year f/u Colon in May 2018.    Patient has OSA and has been intolerant to CPAP masks& historically has hx/o poor sleep hygiene with RLS and frequent awakenings. He alleges averaging 3 hr sleep /night.      HTN predates circa 1990. Patient's BP has been controlled at home.  Today's BP is at goal - 130/80.  In 2003, patient had ACS/acute NSTEMI w PCA / Stent rescue. He had an Ablation for pAfib in 2004 and in 2010, he had a negative Stress Myoview. Patient denies any cardiac symptoms as chest pain, palpitations, shortness of breath, dizziness or ankle swelling.     Patient's hyperlipidemia is controlled with diet and medications. Patient denies myalgias or other medication SE's. Last lipids were at goal albeit elevated Trig's:  Lab Results  Component Value Date   CHOL 176 10/27/2017   HDL 61 10/27/2017   LDLCALC 82 10/27/2017   TRIG 237 (H) 10/27/2017   CHOLHDL 2.9 10/27/2017      Patient has hx/o Obesity (BMI 31+) and is monitored proactively for prediabetes.  Patient denies reactive hypoglycemic symptoms, visual blurring, diabetic polys or paresthesias. Last A1c was not at goal: Lab Results  Component Value Date   HGBA1C 5.8 (H) 10/27/2017       Finally, patient has history of Vitamin D Deficiency ("26" / 2008)  and last vitamin D was still low: Lab Results  Component Value Date    VD25OH 89 07/21/2017   Current Outpatient Medications on File Prior to Visit  Medication Sig  . BYSTOLIC 10 MG tablet TAKE 1 TABLET(10 MG) BY MOUTH DAILY  . clopidogrel (PLAVIX) 75 MG tablet take 1 tablet by mouth once daily  . ezetimibe (ZETIA) 10 MG tablet TAKE 1 TABLET BY MOUTH EVERY DAY  . Omega-3 Fatty Acids (OMEGA 3 PO) Take 1 capsule by mouth daily.  . phentermine (ADIPEX-P) 37.5 MG tablet Take 1/2 to 1 tablet every morning for Dieting & Weight Loss  . rosuvastatin (CRESTOR) 10 MG tablet TAKE 1 TABLET BY MOUTH ONCE DAILY  . sertraline (ZOLOFT) 50 MG tablet TAKE 1 TABLET BY MOUTH EVERY DAY  . telmisartan (MICARDIS) 80 MG tablet TAKE 1 TABLET(80 MG) BY MOUTH ONCE A DAY  . topiramate (TOPAMAX) 50 MG tablet Take 1 tablet 2 x / day at Suppertime & Bedtime for Dieting & Weight Loss   No current facility-administered medications on file prior to visit.    Allergies  Allergen Reactions  . Lisinopril Cough    ACEi cough  . Amoxicillin Hives and Swelling  . Lunesta [Eszopiclone]     Restless legs  . Penicillins Swelling   Past Medical History:  Diagnosis Date  . CAD (coronary artery disease), native coronary artery    Cath 04/19/2001  normal Left main, moderate proximal disease, 80% stenosis distal LAD, 99% stenosis proximal OM 1, 60% stenosis mid RCA, 70% stenosis ostial mid PDA  PCI with bare metal stent of OM1 same date 3.5 x 18 mm Multilink    . Diverticulosis   . GERD (gastroesophageal reflux disease)   . Hernia   . History of PSVT    Ablation done in 2004 at [facility]   . Hyperlipidemia   . Hypertension   . Symptomatic PVCs   . Vitamin D deficiency    Health Maintenance  Topic Date Due  . PNA vac Low Risk Adult (2 of 2 - PPSV23) 10/23/2019  . COLONOSCOPY  12/24/2021  . TETANUS/TDAP  03/07/2026  . INFLUENZA VACCINE  Completed  . Hepatitis C Screening  Completed   Immunization History  Administered Date(s) Administered  . Influenza Split 10/13/2013, 10/23/2014   . Influenza, High Dose Seasonal PF 01/09/2017, 10/27/2017  . Influenza,inj,quad, With Preservative 01/13/2013  . Influenza-Unspecified 10/07/2011  . PPD Test 10/13/2013, 10/23/2014  . Pneumococcal Conjugate-13 10/13/2013  . Pneumococcal Polysaccharide-23 10/23/2014  . Td 01/06/2006  . Tdap 03/06/2016  . Zoster 09/22/2011   Last Colon - 12/25/2011 - Dr Delfin Edis - Normal with (+) FHx Colon Ca in mother - recc 5 yr f/u overdue May 2018.   Past Surgical History:  Procedure Laterality Date  . ACHILLES TENDON REPAIR     left  . APPENDECTOMY    . CARDIAC ELECTROPHYSIOLOGY MAPPING AND ABLATION     Family History  Problem Relation Age of Onset  . Cancer Father        brain  . Colon cancer Mother        malignant polyp of rectosigmoid colon  . Hypothyroidism Mother   . Irritable bowel syndrome Mother    Social History   Socioeconomic History  . Marital status: Married    Spouse name: Not on file  . Number of children: 3  . Years of education: Not on file  . Highest education level: Not on file  Occupational History  . Sales / marketing  Tobacco Use  . Smoking status: Never Smoker  . Smokeless tobacco: Never Used  Substance and Sexual Activity  . Alcohol use: Yes    Comment: rarely  . Drug use: No  . Sexual activity: Not on file  Social History Narrative   Drinks 2 cups of coffee a day     ROS Constitutional: Denies fever, chills, weight loss/gain, headaches, insomnia,  night sweats or change in appetite. Does c/o fatigue. Eyes: Denies redness, blurred vision, diplopia, discharge, itchy or watery eyes.  ENT: Denies discharge, congestion, post nasal drip, epistaxis, sore throat, earache, hearing loss, dental pain, Tinnitus, Vertigo, Sinus pain or snoring.  Cardio: Denies chest pain, palpitations, irregular heartbeat, syncope, dyspnea, diaphoresis, orthopnea, PND, claudication or edema Respiratory: denies cough, dyspnea, DOE, pleurisy, hoarseness, laryngitis or wheezing.   Gastrointestinal: Denies dysphagia, heartburn, reflux, water brash, pain, cramps, nausea, vomiting, bloating, diarrhea, constipation, hematemesis, melena, hematochezia, jaundice or hemorrhoids Genitourinary: Denies dysuria, frequency, urgency, nocturia, hesitancy, discharge, hematuria or flank pain Musculoskeletal: Denies arthralgia, myalgia, stiffness, Jt. Swelling, pain, limp or strain/sprain. Denies Falls. Skin: Denies puritis, rash, hives, warts, acne, eczema or change in skin lesion Neuro: No weakness, tremor, incoordination, spasms, paresthesia or pain Psychiatric: Denies confusion, memory loss or sensory loss. Denies Depression. Endocrine: Denies change in weight, skin, hair change, nocturia, and paresthesia, diabetic polys, visual blurring or hyper / hypo glycemic episodes.  Heme/Lymph: No excessive  bleeding, bruising or enlarged lymph nodes.  Physical Exam  BP 130/80   Pulse 68   Temp (!) 97.5 F (36.4 C)   Ht 5\' 10"  (1.778 m)   Wt 216 lb 12.8 oz (98.3 kg)   SpO2 99%   BMI 31.11 kg/m   General Appearance: Over nourished and well groomed and in no apparent distress.  Eyes: PERRLA, EOMs, conjunctiva no swelling or erythema, normal fundi and vessels. Sinuses: No frontal/maxillary tenderness ENT/Mouth: EACs patent / TMs  nl. Nares clear without erythema, swelling, mucoid exudates. Oral hygiene is good. No erythema, swelling, or exudate. Tongue normal, non-obstructing. Tonsils not swollen or erythematous. Hearing normal.  Neck: Supple, thyroid not palpable. No bruits, nodes or JVD. Respiratory: Respiratory effort normal.  BS equal and clear bilateral without rales, rhonci, wheezing or stridor. Cardio: Heart sounds are normal with regular rate and rhythm and no murmurs, rubs or gallops. Peripheral pulses are normal and equal bilaterally without edema. No aortic or femoral bruits. Chest: symmetric with normal excursions and percussion.  Abdomen: Soft, with Nl bowel sounds.  Nontender, no guarding, rebound, hernias, masses, or organomegaly.  Lymphatics: Non tender without lymphadenopathy.  Genitourinary: No hernias.Testes nl. DRE - prostate nl for age - smooth & firm w/o nodules. Musculoskeletal: Full ROM all peripheral extremities, joint stability, 5/5 strength, and normal gait. Skin: Warm and dry without rashes, lesions, cyanosis, clubbing or  ecchymosis.  Neuro: Cranial nerves intact, reflexes equal bilaterally. Normal muscle tone, no cerebellar symptoms. Sensation intact.  Pysch: Alert and oriented X 3 with normal affect, insight and judgment appropriate.   Assessment and Plan  1. Essential hypertension  - EKG 12-Lead - [place], RETROPERITNL ABD,  LTD - Urinalysis, Routine w reflex microscopic - Microalbumin / creatinine urine ratio - CBC with Differential/Platelet - COMPLETE METABOLIC PANEL WITH GFR - Magnesium - TSH  2. Hyperlipidemia, mixed  - EKG 12-Lead - [place], RETROPERITNL ABD,  LTD - Lipid panel - TSH  3. Abnormal glucose  - EKG 12-Lead - [place], RETROPERITNL ABD,  LTD - Hemoglobin A1c - Insulin, random  4. Vitamin D deficiency  - VITAMIN D 25 Hydroxyl  5. Prediabetes  - EKG 12-Lead - [place], RETROPERITNL ABD,  LTD - Hemoglobin A1c - Insulin, random  6. Coronary artery disease involving native coronary artery of native heart without angina pectoris  - EKG 12-Lead - Lipid panel  7. OSA (obstructive sleep apnea)   8. Screening for colorectal cancer  - POC Hemoccult Bld/Stl  - Ambulatory referral to Gastroenterology  9. BPH with obstruction/lower urinary tract symptoms  - PSA  10. Prostate cancer screening  - PSA  11. Screening for ischemic heart disease  - EKG 12-Lead  12. FH: hypertension  - EKG 12-Lead - [place], RETROPERITNL ABD,  LTD  13. Screening for AAA (aortic abdominal aneurysm)  - [place], RETROPERITNL ABD,  LTD  14. Medication management  - Urinalysis, Routine w reflex microscopic - Microalbumin / creatinine  urine ratio - CBC with Differential/Platelet - COMPLETE METABOLIC PANEL WITH GFR - Magnesium - Lipid panel - TSH - Hemoglobin A1c - Insulin, random - VITAMIN D 25 Hydroxyl  15. Erectile dysfunction, unspecified erectile dysfunction type  - tadalafil (CIALIS) 20 MG tablet; Take 1/2 to 1 tablet every 2 to 3 days as needed for XXXX  Dispense: 30 tablet; Refill: 11  16. FHx: colon cancer  - Ambulatory referral to Gastroenterology          Patient was counseled in prudent diet, weight control to  achieve/maintain BMI less than 25, BP monitoring, regular exercise and medications as discussed.  Discussed med effects and SE's. Routine screening labs and tests as requested with regular follow-up as recommended. Over 40 minutes of exam, counseling, chart review and high complex critical decision making was performed

## 2018-01-29 ENCOUNTER — Telehealth: Payer: Self-pay | Admitting: Internal Medicine

## 2018-01-29 LAB — URINALYSIS, ROUTINE W REFLEX MICROSCOPIC
Bilirubin Urine: NEGATIVE
Glucose, UA: NEGATIVE
HGB URINE DIPSTICK: NEGATIVE
Ketones, ur: NEGATIVE
LEUKOCYTES UA: NEGATIVE
NITRITE: NEGATIVE
PROTEIN: NEGATIVE
Specific Gravity, Urine: 1.019 (ref 1.001–1.03)
pH: <=5 (ref 5.0–8.0)

## 2018-01-29 LAB — VITAMIN D 25 HYDROXY (VIT D DEFICIENCY, FRACTURES): VIT D 25 HYDROXY: 52 ng/mL (ref 30–100)

## 2018-01-29 LAB — COMPLETE METABOLIC PANEL WITH GFR
AG RATIO: 1.9 (calc) (ref 1.0–2.5)
ALBUMIN MSPROF: 5 g/dL (ref 3.6–5.1)
ALT: 33 U/L (ref 9–46)
AST: 24 U/L (ref 10–35)
Alkaline phosphatase (APISO): 71 U/L (ref 40–115)
BUN: 16 mg/dL (ref 7–25)
CO2: 28 mmol/L (ref 20–32)
Calcium: 10 mg/dL (ref 8.6–10.3)
Chloride: 105 mmol/L (ref 98–110)
Creat: 1.14 mg/dL (ref 0.70–1.25)
GFR, EST NON AFRICAN AMERICAN: 66 mL/min/{1.73_m2} (ref >=60)
GFR, Est African American: 77 mL/min/{1.73_m2} (ref >=60)
GLOBULIN: 2.7 g/dL (ref 1.9–3.7)
Glucose, Bld: 108 mg/dL — ABNORMAL HIGH (ref 65–99)
POTASSIUM: 4.3 mmol/L (ref 3.5–5.3)
SODIUM: 140 mmol/L (ref 135–146)
Total Bilirubin: 0.8 mg/dL (ref 0.2–1.2)
Total Protein: 7.7 g/dL (ref 6.1–8.1)

## 2018-01-29 LAB — MICROALBUMIN / CREATININE URINE RATIO
Creatinine, Urine: 174 mg/dL (ref 20–320)
MICROALB UR: 0.4 mg/dL
MICROALB/CREAT RATIO: 2 ug/mg{creat} (ref <30)

## 2018-01-29 LAB — CBC WITH DIFFERENTIAL/PLATELET
Absolute Monocytes: 577 cells/uL (ref 200–950)
Basophils Absolute: 52 cells/uL (ref 0–200)
Basophils Relative: 0.7 %
EOS ABS: 259 {cells}/uL (ref 15–500)
EOS PCT: 3.5 %
HEMATOCRIT: 52.1 % — AB (ref 38.5–50.0)
Hemoglobin: 18 g/dL — ABNORMAL HIGH (ref 13.2–17.1)
LYMPHS ABS: 1850 {cells}/uL (ref 850–3900)
MCH: 30.4 pg (ref 27.0–33.0)
MCHC: 34.5 g/dL (ref 32.0–36.0)
MCV: 88 fL (ref 80.0–100.0)
MPV: 10.4 fL (ref 7.5–12.5)
Monocytes Relative: 7.8 %
NEUTROS PCT: 63 %
Neutro Abs: 4662 cells/uL (ref 1500–7800)
Platelets: 284 10*3/uL (ref 140–400)
RBC: 5.92 10*6/uL — ABNORMAL HIGH (ref 4.20–5.80)
RDW: 11.7 % (ref 11.0–15.0)
Total Lymphocyte: 25 %
WBC: 7.4 10*3/uL (ref 3.8–10.8)

## 2018-01-29 LAB — TSH: TSH: 2.1 mIU/L (ref 0.40–4.50)

## 2018-01-29 LAB — LIPID PANEL
CHOLESTEROL: 145 mg/dL (ref <200)
HDL: 51 mg/dL (ref >40)
LDL CHOLESTEROL (CALC): 71 mg/dL
Non-HDL Cholesterol (Calc): 94 mg/dL (calc) (ref <130)
Total CHOL/HDL Ratio: 2.8 (calc) (ref <5.0)
Triglycerides: 155 mg/dL — ABNORMAL HIGH (ref <150)

## 2018-01-29 LAB — HEMOGLOBIN A1C
Hgb A1c MFr Bld: 5.2 % of total Hgb (ref <5.7)
MEAN PLASMA GLUCOSE: 103 (calc)
eAG (mmol/L): 5.7 (calc)

## 2018-01-29 LAB — INSULIN, RANDOM: INSULIN: 23.7 u[IU]/mL — AB (ref 2.0–19.6)

## 2018-01-29 LAB — MAGNESIUM: Magnesium: 2.3 mg/dL (ref 1.5–2.5)

## 2018-01-29 LAB — PSA: PSA: 1 ng/mL (ref <=4.0)

## 2018-01-29 NOTE — Telephone Encounter (Signed)
See note below and schedule with Dr. Hilarie Fredrickson.

## 2018-01-29 NOTE — Telephone Encounter (Signed)
Referral received for patient to have recall colon. Former Dr.Brodie pt with recall in system for 12.2018. Patient requesting Dr.Pyrtle due to his spouse and brother seeing him. Is it okay to schedule recall colon with Dr.Pyrtle?

## 2018-01-29 NOTE — Telephone Encounter (Signed)
Ok with me 

## 2018-01-29 NOTE — Telephone Encounter (Signed)
Dr. Pyrtle will you accept this pt? Please advise. 

## 2018-02-02 ENCOUNTER — Other Ambulatory Visit: Payer: Self-pay | Admitting: Adult Health

## 2018-02-03 NOTE — Telephone Encounter (Signed)
Left voicemail for patient to call back and schedule with Dr.Pyrtle. Referral in system as well.

## 2018-02-04 ENCOUNTER — Other Ambulatory Visit: Payer: Self-pay | Admitting: Adult Health

## 2018-02-08 ENCOUNTER — Ambulatory Visit: Payer: Medicare Other | Admitting: Neurology

## 2018-02-09 ENCOUNTER — Ambulatory Visit: Payer: Self-pay | Admitting: Internal Medicine

## 2018-02-09 ENCOUNTER — Encounter: Payer: Self-pay | Admitting: Neurology

## 2018-03-11 ENCOUNTER — Encounter: Payer: Self-pay | Admitting: Internal Medicine

## 2018-03-17 ENCOUNTER — Encounter: Payer: Self-pay | Admitting: Internal Medicine

## 2018-03-21 ENCOUNTER — Other Ambulatory Visit: Payer: Self-pay | Admitting: Physician Assistant

## 2018-04-13 ENCOUNTER — Other Ambulatory Visit: Payer: Self-pay | Admitting: Internal Medicine

## 2018-04-13 DIAGNOSIS — I10 Essential (primary) hypertension: Secondary | ICD-10-CM

## 2018-04-19 ENCOUNTER — Other Ambulatory Visit: Payer: Self-pay | Admitting: *Deleted

## 2018-04-19 MED ORDER — CLOPIDOGREL BISULFATE 75 MG PO TABS: 75.0000 mg | ORAL_TABLET | Freq: Every day | ORAL | 3 refills | Status: DC

## 2018-04-22 DIAGNOSIS — N529 Male erectile dysfunction, unspecified: Secondary | ICD-10-CM | POA: Insufficient documentation

## 2018-04-22 NOTE — Progress Notes (Signed)
Virtual Visit via Telephone Note  I connected with Bryan [name] on 04/23/18 at  9:30 AM EDT by telephone and verified that I am speaking with the correct person using two identifiers.   I discussed the limitations, risks, security and privacy concerns of performing an evaluation and management service by telephone and the availability of in person appointments. I also discussed with the patient that there may be a patient responsible charge related to this service. The patient expressed understanding and agreed to proceed.    I discussed the assessment and treatment plan with the patient. The patient was provided an opportunity to ask questions and all were answered. The patient agreed with the plan and demonstrated an understanding of the instructions.   The patient was advised to call back or seek an in-person evaluation if the symptoms worsen or if the condition fails to improve as anticipated.  I provided 30 minutes of non-face-to-face time during this encounter.   Izora Ribas, NP    MEDICARE ANNUAL WELLNESS VISIT AND FOLLOW UP Assessment:   Diagnoses and all orders for this visit:  Encounter for Medicare annual wellness exam  Symptomatic PVCs Has very rarely; symptoms stable recently.  Continue with medications, followed by cardiology.   Essential hypertension BPs at goal  Monitor blood pressure at home; call if consistently over 130/80 Continue DASH diet.   Reminder to go to the ER if any CP, SOB, nausea, dizziness, severe HA, changes vision/speech, left arm numbness and tingling and jaw pain.  History of PSVT Followed by cardiology  Coronary artery disease involving native coronary artery of native heart, angina presence unspecified Control blood pressure, cholesterol, glucose, increase exercise.  Followed by cardiology  OSA (obstructive sleep apnea) Followed by Dr. Brett Fairy; intolerant of CPAP but doing well with oral appliance Having a split sleep study done  next week with Dr. Brett Fairy  CKD (chronic kidney disease) stage 2, GFR 60-89 ml/min Increase fluids, avoid NSAIDS, monitor sugars, will monitor  Vitamin D deficiency Continue supplementation for goal of 70-100 Check vitamin D level  Other abnormal glucose Recent A1Cs at goal Discussed diet/exercise, weight management  Defer A1C; check BMP  Obesity (BMI 30.0-34.9) Long discussion about weight loss, diet, and exercise Recommended diet heavy in fruits and veggies and low in animal meats, cheeses, and dairy products, appropriate calorie intake Discussed appropriate weight for height  Patient on phentermine with benefit and no SE, taking drug breaks; continue close f Follow up at next visit  Medication management CBC, CMP/GFR, LFTs  Primary insomnia Followed by Dr. Brett Fairy, needs to schedule follow up Good sleep hygiene discussed, increase day time activity Continue with careful use of sleep aid agents Would like to retry lunestra; discussed and refilled from previous  Mixed hyperlipidemia Triglycerides remain slightly elevated; discussed diet, alcohol, has recently added omega 3 Continue statin/zetia Continue low cholesterol diet and exercise.  Check lipid panel.   Defer labs until next in-office OV  Over 30 minutes of exam, counseling, chart review, and critical decision making was performed  Future Appointments  Date Time Provider [place]  05/07/2018 10:30 AM Pyrtle, Lajuan Lines, MD [facility]  08/05/2018  9:30 AM [name] Pinto, MD [facility] None  02/07/2019  9:00 AM [name] Pinto, MD [facility] None    Plan:   During the course of the visit the patient was educated and counseled about appropriate screening and preventive services including:    Pneumococcal vaccine   Influenza vaccine  Prevnar 13  Td vaccine  Screening electrocardiogram  Colorectal cancer screening  Diabetes screening  Glaucoma screening  Nutrition counseling     Subjective:  Bryan [name] is a 68 y.o. male who presents for Medicare Annual Wellness Visit and 3 month follow up for HTN, hyperlipidemia, glucose management, and vitamin D Def.   He has hx of PSVT/PVCs but recently improved without episodes in several years.   He has ongoing concerns regarding ongoing symptoms of "Restless Legs" as reported by his partner despite normal iron levels/on supplement.  Patient also has hx/o OSA w/intolerance to a variety of CPAP masks and apparently is doing well with an oral appliance 'curing' his snoring.   He is not currently taking anything for sleep; reports he hasn't been able to sleep more than a few hours for the last several weeks; he has tried [place] in the past. He reports this did help him sleep, stopped due to odd taste in mouth but would like to restart.   BMI is Body mass index is 28.44 kg/m., he has been working on diet and exercise. Wt Readings from Last 3 Encounters:  04/23/18 198 lb 3.2 oz (89.9 kg)  01/28/18 216 lb 12.8 oz (98.3 kg)  01/22/18 223 lb 9.6 oz (101.4 kg)   His blood pressure has been controlled at home, today their BP is BP: 115/75 He does workout. He denies chest pain, shortness of breath, dizziness.   He is on cholesterol medication (zetia 10 mg daily, crestor 10 mg daily, omega 3) and denies myalgias. His cholesterol is at goal. The cholesterol last visit was:   Lab Results  Component Value Date   CHOL 145 01/28/2018   HDL 51 01/28/2018   LDLCALC 71 01/28/2018   TRIG 155 (H) 01/28/2018   CHOLHDL 2.8 01/28/2018   He has been working on diet and exercise for glucose management, and denies foot ulcerations, increased appetite, nausea, paresthesia of the feet, polydipsia, polyuria, visual disturbances, vomiting and weight loss. Last A1C in the office was:  Lab Results  Component Value Date   HGBA1C 5.2 01/28/2018   Last GFR Lab Results  Component Value Date   GFRNONAA 66 01/28/2018    Patient is on Vitamin D  supplement but remains below goal of 60:    Lab Results  Component Value Date   VD25OH 52 01/28/2018        Current Outpatient Medications on File Prior to Visit  Medication Sig Dispense Refill  . BYSTOLIC 10 MG tablet TAKE 1 TABLET(10 MG) BY MOUTH DAILY 90 tablet 0  . ezetimibe (ZETIA) 10 MG tablet TAKE 1 TABLET BY MOUTH EVERY DAY 90 tablet 1  . Omega-3 Fatty Acids (OMEGA 3 PO) Take 1 capsule by mouth daily.    . rosuvastatin (CRESTOR) 10 MG tablet Take 1 tablet Daily for Cholesterol 90 tablet 3  . tadalafil (CIALIS) 20 MG tablet Take 1/2 to 1 tablet every 2 to 3 days as needed for XXXX 30 tablet 11  . telmisartan (MICARDIS) 80 MG tablet TAKE 1 TABLET(80 MG) BY MOUTH EVERY DAY 90 tablet 1  . topiramate (TOPAMAX) 50 MG tablet Take 1 tablet 2 x / day at Suppertime & Bedtime for Dieting & Weight Loss 180 tablet 1   No current facility-administered medications on file prior to visit.      Allergies: Allergies  Allergen Reactions  . Lisinopril Cough    ACEi cough  . Amoxicillin Hives and Swelling  . Penicillins Swelling    Current Problems (verified) has Hyperlipidemia;  Hypertension; Vitamin D deficiency; Other abnormal glucose; Medication management; CAD (coronary artery disease), native coronary artery; History of PSVT; Symptomatic PVCs; CKD (chronic kidney disease) stage 2, GFR 60-89 ml/min; Obesity (BMI 30.0-34.9); Insomnia; OSA (obstructive sleep apnea); and ED (erectile dysfunction) on their problem list.  Screening Tests Immunization History  Administered Date(s) Administered  . Influenza Split 10/13/2013, 10/23/2014  . Influenza, High Dose Seasonal PF 01/09/2017, 10/27/2017  . Influenza,inj,quad, With Preservative 01/13/2013  . Influenza-Unspecified 10/07/2011  . PPD Test 10/13/2013, 10/23/2014  . Pneumococcal Conjugate-13 10/13/2013  . Pneumococcal Polysaccharide-23 10/23/2014  . Td 01/06/2006  . Tdap 03/06/2016  . Zoster 09/22/2011   Preventative care: Last  colonoscopy: 2013   CXR 2019 MRI lumbar/hip 2017  Prior vaccinations: TD or Tdap: 2018  Influenza: 2019 Pneumococcal: 2016 Prevnar13: 2015 Shingles/Zostavax: 2013  Names of Other Physician/Practitioners you currently use: 1. [facility] here for primary care 2. Lens, eye doctor, last visit 2019 3. Turner, dentist, last visit 2020  Patient Care Team: [name] Pinto, MD as PCP - General (Internal Medicine)  Surgical: He  has a past surgical history that includes Appendectomy; Achilles tendon repair; and Cardiac electrophysiology mapping and ablation. Family His family history includes Cancer in his father; Colon cancer in his mother; Hypothyroidism in his mother; Irritable bowel syndrome in his mother. Social history  He reports that he has never smoked. He has never used smokeless tobacco. He reports current alcohol use. He reports that he does not use drugs.  MEDICARE WELLNESS OBJECTIVES: Physical activity: Current Exercise Habits: Home exercise routine, Type of exercise: walking, Time (Minutes): 15, Frequency (Times/Week): 7, Weekly Exercise (Minutes/Week): 105, Intensity: Mild, Exercise limited by: None identified Cardiac risk factors: Cardiac Risk Factors include: dyslipidemia;male gender;advanced age (>21men, >66 women);hypertension Depression/mood screen:   Depression screen [facility] 2/9 04/23/2018  Decreased Interest 0  Down, Depressed, Hopeless 1  PHQ - 2 Score 1    ADLs:  In your present state of health, do you have any difficulty performing the following activities: 04/23/2018 01/28/2018  Hearing? N N  Vision? N N  Difficulty concentrating or making decisions? N N  Walking or climbing stairs? N N  Dressing or bathing? N N  Doing errands, shopping? N N  Some recent data might be hidden     Cognitive Testing  Alert? Yes  Normal Appearance?Yes  Oriented to person? Yes  Place? Yes   Time? Yes  Recall of three objects?  Yes  Can  perform simple calculations? Yes  Displays appropriate judgment?Yes  Can read the correct time from a watch face?Yes  EOL planning: Does Patient Have a Medical Advance Directive?: Yes Type of Advance Directive: Healthcare Power of Attorney, Living will Does patient want to make changes to medical advance directive?: No - Patient declined Copy of  in Chart?: No - copy requested   Objective:   Today's Vitals   04/23/18 0936  BP: 115/75  Pulse: 73  Temp: 98.1 F (36.7 C)  Weight: 198 lb 3.2 oz (89.9 kg)   Body mass index is 28.44 kg/m.  General : Well sounding patient in no apparent distress HEENT: no hoarseness, no cough for duration of visit Lungs: speaks in complete sentences, no audible wheezing, no apparent distress Neurological: alert, oriented x 3 Psychiatric: pleasant, judgement appropriate    Medicare Attestation I have personally reviewed: The patient's medical and social history Their use of alcohol, tobacco or illicit drugs Their current medications and supplements The patient's functional  ability including ADLs,fall risks, home safety risks, cognitive, and hearing and visual impairment Diet and physical activities Evidence for depression or mood disorders  The patient's weight, height, BMI, and visual acuity have been recorded in the chart.  I have made referrals, counseling, and provided education to the patient based on review of the above and I have provided the patient with a written personalized care plan for preventive services.     Izora Ribas, NP   04/23/2018

## 2018-04-23 ENCOUNTER — Other Ambulatory Visit: Payer: Self-pay

## 2018-04-23 ENCOUNTER — Encounter: Payer: Self-pay | Admitting: Adult Health

## 2018-04-23 ENCOUNTER — Other Ambulatory Visit: Payer: Self-pay | Admitting: Internal Medicine

## 2018-04-23 ENCOUNTER — Ambulatory Visit: Payer: Self-pay | Admitting: Adult Health

## 2018-04-23 ENCOUNTER — Ambulatory Visit: Payer: Medicare Other | Admitting: Adult Health

## 2018-04-23 VITALS — BP 115/75 | HR 73 | Temp 98.1°F | Wt 198.2 lb

## 2018-04-23 DIAGNOSIS — F5101 Primary insomnia: Secondary | ICD-10-CM

## 2018-04-23 DIAGNOSIS — Z79899 Other long term (current) drug therapy: Secondary | ICD-10-CM | POA: Diagnosis not present

## 2018-04-23 DIAGNOSIS — G4733 Obstructive sleep apnea (adult) (pediatric): Secondary | ICD-10-CM | POA: Diagnosis not present

## 2018-04-23 DIAGNOSIS — I471 Supraventricular tachycardia: Secondary | ICD-10-CM | POA: Diagnosis not present

## 2018-04-23 DIAGNOSIS — E669 Obesity, unspecified: Secondary | ICD-10-CM | POA: Diagnosis not present

## 2018-04-23 DIAGNOSIS — N529 Male erectile dysfunction, unspecified: Secondary | ICD-10-CM | POA: Diagnosis not present

## 2018-04-23 DIAGNOSIS — Z0000 Encounter for general adult medical examination without abnormal findings: Secondary | ICD-10-CM

## 2018-04-23 DIAGNOSIS — N182 Chronic kidney disease, stage 2 (mild): Secondary | ICD-10-CM | POA: Diagnosis not present

## 2018-04-23 DIAGNOSIS — I10 Essential (primary) hypertension: Secondary | ICD-10-CM

## 2018-04-23 DIAGNOSIS — R7309 Other abnormal glucose: Secondary | ICD-10-CM

## 2018-04-23 DIAGNOSIS — E782 Mixed hyperlipidemia: Secondary | ICD-10-CM | POA: Diagnosis not present

## 2018-04-23 DIAGNOSIS — J4520 Mild intermittent asthma, uncomplicated: Secondary | ICD-10-CM

## 2018-04-23 DIAGNOSIS — F329 Major depressive disorder, single episode, unspecified: Secondary | ICD-10-CM

## 2018-04-23 DIAGNOSIS — I2510 Atherosclerotic heart disease of native coronary artery without angina pectoris: Secondary | ICD-10-CM

## 2018-04-23 DIAGNOSIS — E6609 Other obesity due to excess calories: Secondary | ICD-10-CM

## 2018-04-23 DIAGNOSIS — Z0001 Encounter for general adult medical examination with abnormal findings: Secondary | ICD-10-CM | POA: Diagnosis not present

## 2018-04-23 DIAGNOSIS — R6889 Other general symptoms and signs: Secondary | ICD-10-CM | POA: Diagnosis not present

## 2018-04-23 DIAGNOSIS — E559 Vitamin D deficiency, unspecified: Secondary | ICD-10-CM | POA: Diagnosis not present

## 2018-04-23 DIAGNOSIS — I493 Ventricular premature depolarization: Secondary | ICD-10-CM

## 2018-04-23 DIAGNOSIS — Z6834 Body mass index (BMI) 34.0-34.9, adult: Secondary | ICD-10-CM

## 2018-04-23 DIAGNOSIS — F32A Depression, unspecified: Secondary | ICD-10-CM

## 2018-04-23 MED ORDER — SERTRALINE HCL 50 MG PO TABS: ORAL_TABLET | ORAL | 1 refills | Status: DC

## 2018-04-23 MED ORDER — ESZOPICLONE 3 MG PO TABS: 3.0000 mg | ORAL_TABLET | Freq: Every evening | ORAL | 2 refills | Status: DC | PRN

## 2018-04-23 MED ORDER — PHENTERMINE HCL 37.5 MG PO TABS: ORAL_TABLET | ORAL | 2 refills | Status: DC

## 2018-04-23 MED ORDER — CLOPIDOGREL BISULFATE 75 MG PO TABS: 75.0000 mg | ORAL_TABLET | Freq: Every day | ORAL | 3 refills | Status: DC

## 2018-04-23 MED ORDER — ALBUTEROL SULFATE HFA 108 (90 BASE) MCG/ACT IN AERS: INHALATION_SPRAY | RESPIRATORY_TRACT | 3 refills | Status: DC

## 2018-04-26 ENCOUNTER — Other Ambulatory Visit: Payer: Self-pay

## 2018-04-29 ENCOUNTER — Other Ambulatory Visit: Payer: Self-pay | Admitting: *Deleted

## 2018-04-29 ENCOUNTER — Ambulatory Visit: Payer: Self-pay | Admitting: Adult Health

## 2018-04-29 DIAGNOSIS — F32A Depression, unspecified: Secondary | ICD-10-CM

## 2018-04-29 DIAGNOSIS — F329 Major depressive disorder, single episode, unspecified: Secondary | ICD-10-CM

## 2018-04-29 MED ORDER — SERTRALINE HCL 50 MG PO TABS: ORAL_TABLET | ORAL | 1 refills | Status: DC

## 2018-05-05 ENCOUNTER — Encounter: Payer: Self-pay | Admitting: *Deleted

## 2018-05-06 ENCOUNTER — Encounter: Payer: Self-pay | Admitting: Internal Medicine

## 2018-05-06 ENCOUNTER — Ambulatory Visit: Payer: Medicare Other | Admitting: Internal Medicine

## 2018-05-06 ENCOUNTER — Other Ambulatory Visit: Payer: Self-pay

## 2018-05-06 VITALS — Ht 71.0 in | Wt 200.0 lb

## 2018-05-06 DIAGNOSIS — Z800 Family history of malignant neoplasm of digestive organs: Secondary | ICD-10-CM

## 2018-05-06 NOTE — Progress Notes (Signed)
Patient was called for a virtual visit to discuss colonoscopy given his family history of colon cancer No answer at either his cell number or his wife's cell number I left a message asking him to call back to reschedule appointment

## 2018-05-07 ENCOUNTER — Ambulatory Visit: Payer: Medicare Other | Admitting: Internal Medicine

## 2018-05-12 ENCOUNTER — Telehealth: Payer: Self-pay | Admitting: *Deleted

## 2018-05-12 ENCOUNTER — Encounter: Payer: Self-pay | Admitting: *Deleted

## 2018-05-12 NOTE — Telephone Encounter (Signed)
   Bryan [name] 1950/03/07 [id number]  Dear [name], NP/Dr Melford Aase:  We will be scheduling the above named patient for a(n) colonoscopy procedure. Our records show that (s)he is on anticoagulation therapy.  Please advise as to whether the patient may come off their therapy of Plavix 5 days prior to their procedure which is to be scheduled.  Please route your response to Dixon Boos, [place] or fax response to [phone].  Sincerely,    [facility]

## 2018-05-12 NOTE — Telephone Encounter (Signed)
[name], NP  Larina Bras, CMA  Caller: Unspecified (Today, 2:29 PM)        Hello Ms. Bryan [name],   Ok to stop plavix 5-7 days prior to colonoscopy procedure. Thanks -   Sincerely,   [name], NP-C

## 2018-05-13 ENCOUNTER — Other Ambulatory Visit: Payer: Self-pay

## 2018-05-13 ENCOUNTER — Ambulatory Visit (INDEPENDENT_AMBULATORY_CARE_PROVIDER_SITE_OTHER): Payer: Medicare Other | Admitting: Internal Medicine

## 2018-05-13 ENCOUNTER — Other Ambulatory Visit: Payer: Self-pay | Admitting: Adult Health

## 2018-05-13 ENCOUNTER — Encounter: Payer: Self-pay | Admitting: Internal Medicine

## 2018-05-13 ENCOUNTER — Telehealth: Payer: Self-pay | Admitting: *Deleted

## 2018-05-13 VITALS — Ht 71.0 in | Wt 200.0 lb

## 2018-05-13 DIAGNOSIS — R1031 Right lower quadrant pain: Secondary | ICD-10-CM | POA: Diagnosis not present

## 2018-05-13 DIAGNOSIS — Z7902 Long term (current) use of antithrombotics/antiplatelets: Secondary | ICD-10-CM

## 2018-05-13 DIAGNOSIS — Z8371 Family history of colonic polyps: Secondary | ICD-10-CM

## 2018-05-13 DIAGNOSIS — G8929 Other chronic pain: Secondary | ICD-10-CM

## 2018-05-13 DIAGNOSIS — I2510 Atherosclerotic heart disease of native coronary artery without angina pectoris: Secondary | ICD-10-CM

## 2018-05-13 NOTE — Progress Notes (Signed)
Patient ID: IMANOL BIHL, male   DOB: 02-21-1950, 68 y.o.   MRN: [id number]  This service was provided via telemedicine.  Telephone call. The patient was located at home The provider was located in provider's GI office. The patient did consent to this telephone visit and is aware of possible charges through their insurance for this visit.   The persons participating in this telemedicine service were the patient and I. Time spent on call: 13 min  HPI: Chrisean Kloth is a 68 year old male with a past medical history of CAD status post PCI on Plavix, GERD, colonic diverticulosis, hypertension, hyperlipidemia and family history of advanced colon polyp in his mother who is seen to discuss repeat screening colonoscopy.  Virtual visit today in the setting of COVID-19  His last colonoscopy was on 12/25/2011 with Dr. Olevia Perches.  This was normal.  He reports that he has been feeling well.  He has had no change in his bowel habits.  He has had a right sided abdominal pain which has come and gone for years.  He reports that this is near where his appendix used to be.  He describes this as at times a significant pain that he is wondered whether he should have evaluated but most times it is a mild pain.  He can feel the mild discomfort there fairly often.  It seems to release and get better with bowel movement or even passing gas.  His bowel habits are regular he reports 90% of the time.  He has a bowel movement daily.  No blood in stool or melena.  He denies constipation and diarrhea.  No weight loss.  No upper GI or hepatobiliary complaint.    Past Medical History:  Diagnosis Date  . CAD (coronary artery disease), native coronary artery    Cath 04/19/2001  normal Left main, moderate proximal disease, 80% stenosis distal LAD, 99% stenosis proximal OM 1, 60% stenosis mid RCA, 70% stenosis ostial mid PDA  PCI with bare metal stent of OM1 same date 3.5 x 18 mm Multilink    . Diverticulosis   . GERD (gastroesophageal  reflux disease)   . Hernia   . History of PSVT    [facility]blation done in 2004 at [facility]   . Hyperlipidemia   . Hypertension   . Symptomatic PVCs   . Vitamin D deficiency     Past Surgical History:  Procedure Laterality Date  . ACHILLES TENDON REPAIR     left  . APPENDECTOMY    . CARDIAC ELECTROPHYSIOLOGY MAPPING AND ABLATION    . CORONARY ANGIOPLASTY WITH STENT PLACEMENT     years ago    Outpatient Medications Prior to Visit  Medication Sig Dispense Refill  . albuterol (PROAIR HFA) 108 (90 Base) MCG/ACT inhaler Use 2 inhalations 15 minutes apart every 4 hours if need to rescue Asthma (Patient not taking: Reported on 05/05/2018) 48 g 3  . BYSTOLIC 10 MG tablet TAKE 1 TABLET(10 MG) BY MOUTH DAILY 90 tablet 0  . clopidogrel (PLAVIX) 75 MG tablet Take 1 tablet (75 mg total) by mouth daily. 90 tablet 3  . eszopiclone 3 MG TABS Take 1 tablet (3 mg total) by mouth at bedtime as needed for sleep. Take immediately before bedtime 30 tablet 2  . ezetimibe (ZETIA) 10 MG tablet TAKE 1 TABLET BY MOUTH EVERY DAY 90 tablet 1  . Omega-3 Fatty Acids (OMEGA 3 PO) Take 1 capsule by mouth daily.    . phentermine (ADIPEX-P) 37.5 MG tablet  Take 1/2 to 1 tablet every morning for Dieting & Weight Loss 30 tablet 2  . rosuvastatin (CRESTOR) 10 MG tablet Take 1 tablet Daily for Cholesterol 90 tablet 3  . sertraline (ZOLOFT) 50 MG tablet Take 1 tablet daily for Mood 90 tablet 1  . tadalafil (CIALIS) 20 MG tablet Take 1/2 to 1 tablet every 2 to 3 days as needed for XXXX (Patient not taking: Reported on 05/05/2018) 30 tablet 11  . telmisartan (MICARDIS) 80 MG tablet TAKE 1 TABLET(80 MG) BY MOUTH EVERY DAY 90 tablet 1  . topiramate (TOPAMAX) 50 MG tablet Take 1 tablet 2 x / day at Suppertime & Bedtime for Dieting & Weight Loss 180 tablet 1   No facility-administered medications prior to visit.     Allergies  Allergen Reactions  . Lisinopril Cough    ACEi cough  . Amoxicillin Hives and Swelling  .  Penicillins Swelling    Family History  Problem Relation Age of Onset  . Brain cancer Father   . Hypothyroidism Mother   . Irritable bowel syndrome Mother   . Colonic polyp Mother        PRE-malignant polyp of rectosigmoid colon  . Esophageal cancer Neg Hx   . Stomach cancer Neg Hx   . Pancreatic cancer Neg Hx   . Liver disease Neg Hx     Social History   Tobacco Use  . Smoking status: Never Smoker  . Smokeless tobacco: Never Used  Substance Use Topics  . Alcohol use: Yes    Comment: rarely  . Drug use: No    ROS: As per history of present illness, otherwise negative  There were no vitals taken for this visit. No PE, virtual visit  RELEVANT LABS AND IMAGING: CBC    Component Value Date/Time   WBC 7.4 01/28/2018 0940   RBC 5.92 (H) 01/28/2018 0940   HGB 18.0 (H) 01/28/2018 0940   HCT 52.1 (H) 01/28/2018 0940   PLT 284 01/28/2018 0940   MCV 88.0 01/28/2018 0940   MCH 30.4 01/28/2018 0940   MCHC 34.5 01/28/2018 0940   RDW 11.7 01/28/2018 0940   LYMPHSABS 1,850 01/28/2018 0940   MONOABS 400 06/05/2016 0938   EOSABS 259 01/28/2018 0940   BASOSABS 52 01/28/2018 0940    CMP     Component Value Date/Time   NA 140 01/28/2018 0940   K 4.3 01/28/2018 0940   CL 105 01/28/2018 0940   CO2 28 01/28/2018 0940   GLUCOSE 108 (H) 01/28/2018 0940   BUN 16 01/28/2018 0940   CREATININE 1.14 01/28/2018 0940   CALCIUM 10.0 01/28/2018 0940   PROT 7.7 01/28/2018 0940   ALBUMIN 4.2 06/05/2016 0938   AST 24 01/28/2018 0940   ALT 33 01/28/2018 0940   ALKPHOS 49 06/05/2016 0938   BILITOT 0.8 01/28/2018 0940   GFRNONAA 66 01/28/2018 0940   GFRAA 77 01/28/2018 0940    ASSESSMENT/PLAN: 68 year old male with a past medical history of CAD status post PCI on Plavix, GERD, colonic diverticulosis, hypertension, hyperlipidemia and family history of advanced colon polyp in his mother who is seen to discuss repeat screening colonoscopy.    1.  Family history of advanced colon  polyps --his last screening exam was 6+ years ago.  Repeat colonoscopy is indicated given his family history.  We discussed the risk, benefits and alternatives and he is agreeable and wishes to proceed.  2.  Right-sided abdominal pain --prior appendectomy.  Possibly scar tissue.  Query whether he could  have some mild ileitis, will plan attempted intubation of the terminal ileum at upcoming screening colonoscopy.  Further evaluation by ultrasound in 2017 and pelvic CT in 2018 were unremarkable.  3.  Plavix use -- Hold Plavix 5 days before procedure - will instruct when and how to resume after procedure. Risks and benefits of procedure including bleeding, perforation, infection, missed lesions, medication reactions and possible hospitalization or surgery if complications occur explained. Additional rare but real risk of cardiovascular event such as heart attack or ischemia/infarct of other organs off Plavix explained and need to seek urgent help if this occurs. Will communicate by phone or EMR with patient's prescribing provider that to confirm holding Plavix is reasonable in this case.      [id number], Gwyndolyn Saxon, [place], [place] [address]

## 2018-05-13 NOTE — Patient Instructions (Addendum)
Please schedule colonoscopy due to family history of colon cancer.  You may hold plavix 5 days prior to your procedure per [name], NP.

## 2018-05-13 NOTE — Telephone Encounter (Signed)
Left voicemail for patient to call back. We need to set him up for colonoscopy (due to family hx colon cancer) and previsit.

## 2018-05-17 NOTE — Telephone Encounter (Signed)
Patient has been scheduled for colonoscopy on 05/26/18 at 2:30 pm with 1:30 pm arrival [place]. He is also scheduled for telephone previsit on 05/19/18 at 2:00 pm for instruction. Patient verbalizes understanding.

## 2018-05-19 ENCOUNTER — Other Ambulatory Visit: Payer: Self-pay

## 2018-05-19 ENCOUNTER — Ambulatory Visit (AMBULATORY_SURGERY_CENTER): Payer: Self-pay | Admitting: *Deleted

## 2018-05-19 VITALS — Ht 71.0 in | Wt 197.0 lb

## 2018-05-19 DIAGNOSIS — Z8371 Family history of colonic polyps: Secondary | ICD-10-CM

## 2018-05-19 MED ORDER — PLENVU 140 G PO SOLR: 1.0000 | Freq: Once | ORAL | 0 refills | Status: AC

## 2018-05-19 NOTE — Progress Notes (Signed)
No egg or soy allergy known to patient  No issues with past sedation with any surgeries  or procedures, no intubation problems PATIENT STATING HE STOPPED PHENTERMINE OVER 10 DAYS AGO. No home 02 use per patient  PATIENT STATING HE STOPPED PLAVIX YESTERDAY ON HIS OWN STATING HE KNEW THAT HE WOULD NEED TO STOP HIS BLOOD THINNER,INSFORMED THE PATIENT THAT HE COULD CONTINUE WITH LAST DAY ON 14. Pt denies issues with constipation  EMMI video sent to pt's e mail  [place] [facility] [place]. ot # [id number], exp 07/2018 Patient's interview per telephone, patient identified with name dob and address. Patient's hx , medication and instructions reviewed.Patient verbalizing understanding

## 2018-05-24 ENCOUNTER — Telehealth: Payer: Self-pay

## 2018-05-24 NOTE — Telephone Encounter (Signed)
Covid-19 travel screening questions  Have you traveled in the last 14 days? If yes where?no  Do you now or have you had a fever in the last 14 days?no  Do you have any respiratory symptoms of shortness of breath or cough now or in the last 14 days?no  Do you have a medical history of Congestive Heart Failure?  Do you have a medical history of lung disease?  Do you have any family members or close contacts with diagnosed or suspected Covid-19?no   Pt advised that he will need to know his care partner's cell phone # and to wear a mask into the bldg for his procedure.  He agreed

## 2018-05-26 ENCOUNTER — Encounter: Payer: Self-pay | Admitting: Internal Medicine

## 2018-05-26 ENCOUNTER — Ambulatory Visit (AMBULATORY_SURGERY_CENTER): Payer: Medicare Other | Admitting: Internal Medicine

## 2018-05-26 ENCOUNTER — Other Ambulatory Visit: Payer: Self-pay

## 2018-05-26 VITALS — BP 104/64 | HR 69 | Temp 99.0°F | Resp 16 | Ht 71.0 in | Wt 200.0 lb

## 2018-05-26 DIAGNOSIS — Z1211 Encounter for screening for malignant neoplasm of colon: Secondary | ICD-10-CM

## 2018-05-26 DIAGNOSIS — D120 Benign neoplasm of cecum: Secondary | ICD-10-CM

## 2018-05-26 DIAGNOSIS — D122 Benign neoplasm of ascending colon: Secondary | ICD-10-CM | POA: Diagnosis not present

## 2018-05-26 DIAGNOSIS — Z8371 Family history of colonic polyps: Secondary | ICD-10-CM | POA: Diagnosis not present

## 2018-05-26 DIAGNOSIS — D123 Benign neoplasm of transverse colon: Secondary | ICD-10-CM | POA: Diagnosis not present

## 2018-05-26 DIAGNOSIS — Z800 Family history of malignant neoplasm of digestive organs: Secondary | ICD-10-CM | POA: Diagnosis not present

## 2018-05-26 MED ORDER — SODIUM CHLORIDE 0.9 % IV SOLN: 500.0000 mL | Freq: Once | INTRAVENOUS | Status: DC

## 2018-05-26 NOTE — Patient Instructions (Signed)
YOU HAD AN ENDOSCOPIC PROCEDURE TODAY AT [facility]:   Refer to the procedure report that was given to you for any specific questions about what was found during the examination.  If the procedure report does not answer your questions, please call your gastroenterologist to clarify.  If you requested that your care partner not be given the details of your procedure findings, then the procedure report has been included in a sealed envelope for you to review at your convenience later.  YOU SHOULD EXPECT: Some feelings of bloating in the abdomen. Passage of more gas than usual.  Walking can help get rid of the air that was put into your GI tract during the procedure and reduce the bloating. If you had a lower endoscopy (such as a colonoscopy or flexible sigmoidoscopy) you may notice spotting of blood in your stool or on the toilet paper. If you underwent a bowel prep for your procedure, you may not have a normal bowel movement for a few days.  Please Note:  You might notice some irritation and congestion in your nose or some drainage.  This is from the oxygen used during your procedure.  There is no need for concern and it should clear up in a day or so.  SYMPTOMS TO REPORT IMMEDIATELY:   Following lower endoscopy (colonoscopy or flexible sigmoidoscopy):  Excessive amounts of blood in the stool  Significant tenderness or worsening of abdominal pains  Swelling of the abdomen that is new, acute  Fever of 100F or higher  For urgent or emergent issues, a gastroenterologist can be reached at any hour by calling [phone].   DIET:  We do recommend a small meal at first, but then you may proceed to your regular diet.  Drink plenty of fluids but you should avoid alcoholic beverages for 24 hours.  ACTIVITY:  You should plan to take it easy for the rest of today and you should NOT DRIVE or use heavy machinery until tomorrow (because of the sedation medicines used during the test).     FOLLOW UP: Our staff will call the number listed on your records 48-72 hours following your procedure to check on you and address any questions or concerns that you may have regarding the information given to you following your procedure. If we do not reach you, we will leave a message.  We will attempt to reach you two times.  During this call, we will ask if you have developed any symptoms of COVID 19. If you develop any symptoms (for example fever, flu-like symptoms, shortness of breath, cough etc.) before then, please call [phone].  If any biopsies were taken you will be contacted by phone or by letter within the next 1-3 weeks.  Please call us at [phone] if you have not heard about the biopsies in 3 weeks.   Await for biopsy results Polyps (handout given) Hemorrhoids (handout given)  Resume Plavix prior dose tomorrow  SIGNATURES/CONFIDENTIALITY: You and/or your care partner have signed paperwork which will be entered into your electronic medical record.  These signatures attest to the fact that that the information above on your After Visit Summary has been reviewed and is understood.  Full responsibility of the confidentiality of this discharge information lies with you and/or your care-partner.

## 2018-05-26 NOTE — Progress Notes (Signed)
Called to room to assist during endoscopic procedure.  Patient ID and intended procedure confirmed with present staff. Received instructions for my participation in the procedure from the performing physician.  

## 2018-05-26 NOTE — Progress Notes (Signed)
To PACU, VSS. Report to [email or web address] 

## 2018-05-26 NOTE — Progress Notes (Signed)
Pt's states no medical or surgical changes since previsit or office visit. Courtney Washington-Temps,Judy Branson-vital signs. 

## 2018-05-26 NOTE — Op Note (Signed)
[facility] Patient Name: Bryan [name] Procedure Date: 05/26/2018 3:11 PM MRN: [id number] Endoscopist: Jerene Bears , MD Age: 68 Referring MD:  Date of Birth: 03-08-1950 Gender: Male Account #: [id number] Procedure:                Colonoscopy Indications:              Colon cancer screening in patient with 1st-degree                            relative having advanced adenoma of the colon, Last                            colonoscopy: December 2013 Medicines:                Monitored Anesthesia Care Procedure:                Pre-Anesthesia Assessment:                           - Prior to the procedure, a History and Physical                            was performed, and patient medications and                            allergies were reviewed. The patient's tolerance of                            previous anesthesia was also reviewed. The risks                            and benefits of the procedure and the sedation                            options and risks were discussed with the patient.                            All questions were answered, and informed consent                            was obtained. Prior Anticoagulants: The patient has                            taken Plavix (clopidogrel), last dose was 7 days                            prior to procedure. ASA Grade Assessment: II - A                            patient with mild systemic disease. After reviewing                            the risks and benefits, the patient was deemed in  satisfactory condition to undergo the procedure.                           After obtaining informed consent, the colonoscope                            was passed under direct vision. Throughout the                            procedure, the patient's blood pressure, pulse, and                            oxygen saturations were monitored continuously. The                            Colonoscope was  introduced through the anus and                            advanced to the terminal ileum. The colonoscopy was                            performed without difficulty. The patient tolerated                            the procedure well. The quality of the bowel                            preparation was good. The terminal ileum, ileocecal                            valve, appendiceal orifice, and rectum were                            photographed. Scope In: 3:20:19 PM Scope Out: 3:36:33 PM Scope Withdrawal Time: 0 hours 14 minutes 55 seconds  Total Procedure Duration: 0 hours 16 minutes 14 seconds  Findings:                 The digital rectal exam was normal.                           The terminal ileum appeared normal.                           A 3 mm polyp was found in the cecum. The polyp was                            sessile. The polyp was removed with a cold snare.                            Resection and retrieval were complete.                           Two sessile polyps were found in the ascending  colon. The polyps were 3 to 5 mm in size. These                            polyps were removed with a cold snare. Resection                            and retrieval were complete.                           Four sessile polyps were found in the transverse                            colon. The polyps were 2 to 4 mm in size. These                            polyps were removed with a cold snare. Resection                            and retrieval were complete.                           A few small-mouthed diverticula were found in the                            sigmoid colon.                           Internal hemorrhoids were found during                            retroflexion. The hemorrhoids were small. Complications:            No immediate complications. Estimated Blood Loss:     Estimated blood loss was minimal. Impression:               - The examined  portion of the ileum was normal.                           - One 3 mm polyp in the cecum, removed with a cold                            snare. Resected and retrieved.                           - Two 3 to 5 mm polyps in the ascending colon,                            removed with a cold snare. Resected and retrieved.                           - Four 2 to 4 mm polyps in the transverse colon,                            removed with  a cold snare. Resected and retrieved.                           - Diverticulosis in the sigmoid colon.                           - Internal hemorrhoids. Recommendation:           - Patient has a contact number available for                            emergencies. The signs and symptoms of potential                            delayed complications were discussed with the                            patient. Return to normal activities tomorrow.                            Written discharge instructions were provided to the                            patient.                           - Resume previous diet.                           - Continue present medications.                           - Resume Plavix (clopidogrel) at prior dose                            tomorrow. Refer to managing physician for further                            adjustment of therapy.                           - Await pathology results.                           - Repeat colonoscopy is recommended. The                            colonoscopy date will be determined after pathology                            results from today's exam become available for                            review. Jerene Bears, MD 05/26/2018 3:40:59 PM This report has been signed electronically.

## 2018-05-28 ENCOUNTER — Telehealth: Payer: Self-pay

## 2018-05-28 NOTE — Telephone Encounter (Signed)
  Follow up Call-  Call back number 05/26/2018  Post procedure Call Back phone  # [phone]  Permission to leave phone message Yes  Some recent data might be hidden     Patient questions:  Do you have a fever, pain , or abdominal swelling? No. Pain Score  0 *  Have you tolerated food without any problems? Yes.    Have you been able to return to your normal activities? Yes.    Do you have any questions about your discharge instructions: Diet   No. Medications  No. Follow up visit  No.  Do you have questions or concerns about your Care? No.  Actions: * If pain score is 4 or above: No action needed, pain <4. 1. Have you developed a fever since your procedure? no  2.   Have you had an respiratory symptoms (SOB or cough) since your procedure? no  3.   Have you tested positive for COVID 19 since your procedure no  3.   Have you had any family members/close contacts diagnosed with the COVID 19 since your procedure?  no   If any of these questions are a yes, please inquire if patient has been seen by family doctor and route this note to Joylene John, Therapist, sports.

## 2018-06-01 ENCOUNTER — Encounter: Payer: Self-pay | Admitting: Internal Medicine

## 2018-06-02 ENCOUNTER — Other Ambulatory Visit: Payer: Self-pay | Admitting: Adult Health

## 2018-06-02 ENCOUNTER — Other Ambulatory Visit: Payer: Self-pay

## 2018-06-02 ENCOUNTER — Ambulatory Visit (INDEPENDENT_AMBULATORY_CARE_PROVIDER_SITE_OTHER): Payer: Medicare Other | Admitting: Adult Health

## 2018-06-02 ENCOUNTER — Encounter: Payer: Self-pay | Admitting: Adult Health

## 2018-06-02 VITALS — BP 110/70 | HR 80 | Temp 97.5°F | Ht 71.0 in | Wt 204.0 lb

## 2018-06-02 DIAGNOSIS — M25541 Pain in joints of right hand: Secondary | ICD-10-CM

## 2018-06-02 DIAGNOSIS — M79641 Pain in right hand: Secondary | ICD-10-CM | POA: Diagnosis not present

## 2018-06-02 DIAGNOSIS — I2510 Atherosclerotic heart disease of native coronary artery without angina pectoris: Secondary | ICD-10-CM | POA: Diagnosis not present

## 2018-06-02 DIAGNOSIS — E782 Mixed hyperlipidemia: Secondary | ICD-10-CM

## 2018-06-02 MED ORDER — EZETIMIBE 10 MG PO TABS: 10.0000 mg | ORAL_TABLET | Freq: Every day | ORAL | 1 refills | Status: DC

## 2018-06-02 NOTE — Patient Instructions (Signed)
Will get xray to check for fractures, obvious arthritis   Get on ibuprofen or aleve regularly x 2 weeks then as needed  Get a soft brace to remind you to protect the hand     Hand Pain Many things can cause hand pain. Some common causes are:  An injury.  Repeating the same movement with your hand over and over (overuse).  Osteoporosis.  Arthritis.  Lumps in the tendons or joints of the hand and wrist (ganglion cysts).  Infection. Follow these instructions at home: Pay attention to any changes in your symptoms. Take these actions to help with your discomfort:  If directed, put ice on the affected area: ? Put ice in a plastic bag. ? Place a towel between your skin and the bag. ? Leave the ice on for 15-20 minutes, 3?4 times a day for 2 days.  Take over-the-counter and prescription medicines only as told by your health care provider.  Minimize stress on your hands and wrists as much as possible.  Take breaks from repetitive activity often.  Do stretches as told by your health care provider.  Do not do activities that make your pain worse. Contact a health care provider if:  Your pain does not get better after a few days of self-care.  Your pain gets worse.  Your pain affects your ability to do your daily activities. Get help right away if:  Your hand becomes warm, red, or swollen.  Your hand is numb or tingling.  Your hand is extremely swollen or deformed.  Your hand or fingers turn white or blue.  You cannot move your hand, wrist, or fingers. This information is not intended to replace advice given to you by your health care provider. Make sure you discuss any questions you have with your health care provider. Document Released: 01/19/2015 Document Revised: 05/31/2015 Document Reviewed: 01/18/2014 Elsevier Interactive Patient Education  2019 [employer].

## 2018-06-02 NOTE — Progress Notes (Signed)
Assessment and Plan:  Bryan [name] was seen today for hand pain.  Diagnoses and all orders for this visit:  Right hand pain NO appreciable edema; obvious bony enlargement of joints Some reduced ROM with flexion in 2nd-4th MCPs; get xray to r/o acute abnormality, suspect underlying arthritic changes as well Recommended continue RICE; ibuprofen/aleve regularly x 2 weeks then PRN; can get soft brace to remind to rest hand for 2-4 weeks Follow up if not resolving, can refer to hand specialist if needed -     DG Hand Complete Right; Future  Further disposition pending results of labs. Discussed med's effects and SE's.   Over 15 minutes of exam, counseling, chart review, and critical decision making was performed.   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 Reports max pain is 3/10 at worst, non-radiating.   He has been icing, he did ibuprofen for initial 2-3 days, helped significantly but then stopped.   He has hx of ?fracture or severe strain of 3rd digit; never had surgery, was in brace extensively.   Concerned due to persistent mild edema.   Past  Medical History:  Diagnosis Date  . Allergy   . CAD (coronary artery disease), native coronary artery    Cath 04/19/2001  normal Left main, moderate proximal disease, 80% stenosis distal LAD, 99% stenosis proximal OM 1, 60% stenosis mid RCA, 70% stenosis ostial mid PDA  PCI with bare metal stent of OM1 same date 3.5 x 18 mm Multilink    . Diverticulosis   . GERD (gastroesophageal reflux disease)   . Hernia   . History of PSVT    Ablation done in 2004 at [facility]   . Hyperlipidemia   . Hypertension   . Sleep apnea   . Symptomatic PVCs   . Vitamin D deficiency      Allergies  Allergen Reactions  . Lisinopril Cough    ACEi cough  . Amoxicillin Hives and Swelling  . Penicillins Swelling    Current Outpatient Medications on File Prior to Visit  Medication Sig  . BYSTOLIC 10 MG tablet TAKE 1 TABLET(10 MG) BY MOUTH DAILY  . clopidogrel (PLAVIX) 75 MG tablet Take 1 tablet (75 mg total) by mouth daily.  . eszopiclone 3 MG TABS Take 1 tablet (3 mg total) by mouth at bedtime as needed for sleep. Take immediately before bedtime  . Omega-3 Fatty Acids (OMEGA 3 PO) Take 1 capsule by mouth daily.  . phentermine (ADIPEX-P) 37.5 MG tablet Take 1/2 to 1 tablet every morning for Dieting & Weight Loss (Patient taking differently: as needed. Take 1/2 to 1 tablet every  morning for Dieting & Weight Loss)  . rosuvastatin (CRESTOR) 10 MG tablet Take 1 tablet Daily for Cholesterol  . sertraline (ZOLOFT) 50 MG tablet Take 1 tablet daily for Mood  . telmisartan (MICARDIS) 80 MG tablet TAKE 1 TABLET(80 MG) BY MOUTH EVERY DAY  . topiramate (TOPAMAX) 50 MG tablet Take 1 tablet 2 x / day at Suppertime & Bedtime for Dieting & Weight Loss (Patient taking differently: as needed. Take 1 tablet 2 x / day at Suppertime & Bedtime for Dieting & Weight Loss)  . albuterol (PROAIR HFA) 108 (90 Base) MCG/ACT inhaler Use 2 inhalations 15 minutes apart every 4 hours if need to rescue Asthma (Patient not taking:  Reported on 05/05/2018)  . tadalafil (CIALIS) 20 MG tablet Take 1/2 to 1 tablet every 2 to 3 days as needed for XXXX (Patient not taking: Reported on 05/05/2018)   Current Facility-Administered Medications on File Prior to Visit  Medication  . 0.9 %  sodium chloride infusion    ROS: all negative except above.   Physical Exam:  BP 110/70   Pulse 80   Temp (!) 97.5 F (36.4 C)   Ht 5\' 11"  (1.803 m)   Wt 204 lb (92.5 kg)   SpO2 96%   BMI 28.45 kg/m   General Appearance: Well nourished, in no apparent distress. Eyes: conjunctiva no swelling or erythema ENT/Mouth: Hearing normal.  Neck: Supple Respiratory: Respiratory effort normal, BS equal bilaterally without rales, rhonchi, wheezing or stridor.  Cardio: RRR with no MRGs. Brisk peripheral pulses without edema.  Lymphatics: Non tender without lymphadenopathy.  Musculoskeletal: R hand; obvious bony enlargement of 2nd and 3rd digit MCP, no appreciable edema, full extension maintained; some reduction with flexion through joints in 2nd, 3rd and 4th MCP, no bony tenderness with palpation Skin: Warm, dry without rashes, lesions, ecchymosis.  Neuro: Sensation intact.  Psych: Awake and oriented X 3, normal affect, Insight and Judgment appropriate.     Bryan Ribas, NP 3:37 PM [facility]

## 2018-06-03 ENCOUNTER — Ambulatory Visit
Admission: RE | Admit: 2018-06-03 | Discharge: 2018-06-03 | Disposition: A | Discharge status: Home / Self Care | Payer: Medicare Other | Source: Ambulatory Visit | Attending: Adult Health | Admitting: Adult Health

## 2018-06-03 DIAGNOSIS — M25541 Pain in joints of right hand: Secondary | ICD-10-CM

## 2018-06-03 DIAGNOSIS — M79641 Pain in right hand: Secondary | ICD-10-CM | POA: Diagnosis not present

## 2018-08-05 ENCOUNTER — Encounter: Payer: Self-pay | Admitting: Internal Medicine

## 2018-08-05 ENCOUNTER — Other Ambulatory Visit: Payer: Self-pay

## 2018-08-05 ENCOUNTER — Ambulatory Visit (INDEPENDENT_AMBULATORY_CARE_PROVIDER_SITE_OTHER): Payer: Medicare Other | Admitting: Internal Medicine

## 2018-08-05 VITALS — BP 132/84 | HR 72 | Temp 97.5°F | Resp 16 | Ht 70.0 in | Wt 205.2 lb

## 2018-08-05 DIAGNOSIS — E6609 Other obesity due to excess calories: Secondary | ICD-10-CM

## 2018-08-05 DIAGNOSIS — F329 Major depressive disorder, single episode, unspecified: Secondary | ICD-10-CM | POA: Diagnosis not present

## 2018-08-05 DIAGNOSIS — E559 Vitamin D deficiency, unspecified: Secondary | ICD-10-CM | POA: Diagnosis not present

## 2018-08-05 DIAGNOSIS — R7303 Prediabetes: Secondary | ICD-10-CM | POA: Diagnosis not present

## 2018-08-05 DIAGNOSIS — E349 Endocrine disorder, unspecified: Secondary | ICD-10-CM

## 2018-08-05 DIAGNOSIS — Z79899 Other long term (current) drug therapy: Secondary | ICD-10-CM | POA: Diagnosis not present

## 2018-08-05 DIAGNOSIS — I2510 Atherosclerotic heart disease of native coronary artery without angina pectoris: Secondary | ICD-10-CM

## 2018-08-05 DIAGNOSIS — I10 Essential (primary) hypertension: Secondary | ICD-10-CM

## 2018-08-05 DIAGNOSIS — Z6834 Body mass index (BMI) 34.0-34.9, adult: Secondary | ICD-10-CM

## 2018-08-05 DIAGNOSIS — R5383 Other fatigue: Secondary | ICD-10-CM

## 2018-08-05 DIAGNOSIS — R7309 Other abnormal glucose: Secondary | ICD-10-CM | POA: Diagnosis not present

## 2018-08-05 DIAGNOSIS — F32A Depression, unspecified: Secondary | ICD-10-CM

## 2018-08-05 DIAGNOSIS — E782 Mixed hyperlipidemia: Secondary | ICD-10-CM | POA: Diagnosis not present

## 2018-08-05 MED ORDER — TOPIRAMATE 50 MG PO TABS: ORAL_TABLET | ORAL | 1 refills | Status: DC

## 2018-08-05 MED ORDER — SERTRALINE HCL 100 MG PO TABS: ORAL_TABLET | ORAL | 3 refills | Status: DC

## 2018-08-05 NOTE — Progress Notes (Signed)
History of Present Illness:      This very nice 68 y.o. [name] presents for 6 month follow up with HTN, HLD, Pre-Diabetes and Vitamin D Deficiency.       Patient is treated for HTN & BP has been controlled at home. Today's BP is at goal - 132/84. Patient has had no complaints of any cardiac type chest pain, palpitations, dyspnea / orthopnea / PND, dizziness, claudication, or dependent edema.      Hyperlipidemia is controlled with diet & meds. Patient denies myalgias or other med SE's. Last Lipids were at goal: Lab Results  Component Value Date   CHOL 145 01/28/2018   HDL 51 01/28/2018   LDLCALC 71 01/28/2018   TRIG 155 (H) 01/28/2018   CHOLHDL 2.8 01/28/2018       Also, the patient has history of PreDiabetes and has had no symptoms of reactive hypoglycemia, diabetic polys, paresthesias or visual blurring.  Last A1c was Normal & at goal: Lab Results  Component Value Date   HGBA1C 5.2 01/28/2018       Further, the patient also has history of Vitamin D Deficiency and supplements vitamin D without any suspected side-effects. Last vitamin D was sl low (goal 70-100): Lab Results  Component Value Date   VD25OH 52 01/28/2018   Current Outpatient Medications on File Prior to Visit  Medication Sig  . albuterol (PROAIR HFA) 108 (90 Base) MCG/ACT inhaler Use 2 inhalations 15 minutes apart every 4 hours if need to rescue Asthma  . BYSTOLIC 10 MG tablet TAKE 1 TABLET(10 MG) BY MOUTH DAILY  . clopidogrel (PLAVIX) 75 MG tablet Take 1 tablet (75 mg total) by mouth daily.  . eszopiclone 3 MG TABS Take 1 tablet (3 mg total) by mouth at bedtime as needed for sleep. Take immediately before bedtime  . ezetimibe (ZETIA) 10 MG tablet Take 1 tablet (10 mg total) by mouth daily.  . phentermine (ADIPEX-P) 37.5 MG tablet Take 1/2 to 1 tablet every morning for Dieting & Weight Loss (Patient taking differently: as needed. Take 1/2 to 1 tablet every morning for Dieting & Weight Loss)  . rosuvastatin  (CRESTOR) 10 MG tablet Take 1 tablet Daily for Cholesterol  . sertraline (ZOLOFT) 50 MG tablet Take 1 tablet daily for Mood  . tadalafil (CIALIS) 20 MG tablet Take 1/2 to 1 tablet every 2 to 3 days as needed for XXXX  . telmisartan (MICARDIS) 80 MG tablet TAKE 1 TABLET(80 MG) BY MOUTH EVERY DAY   No current facility-administered medications on file prior to visit.    Allergies  Allergen Reactions  . Lisinopril Cough    ACEi cough  . Amoxicillin Hives and Swelling  . Penicillins Swelling   PMHx:   Past Medical History:  Diagnosis Date  . Allergy   . CAD (coronary artery disease), native coronary artery    Cath 04/19/2001  normal Left main, moderate proximal disease, 80% stenosis distal LAD, 99% stenosis proximal OM 1, 60% stenosis mid RCA, 70% stenosis ostial mid PDA  PCI with bare metal stent of OM1 same date 3.5 x 18 mm Multilink    . Diverticulosis   . GERD (gastroesophageal reflux disease)   . Hernia   . History of PSVT    Ablation done in 2004 at [facility]   . Hyperlipidemia   . Hypertension   . Sleep apnea   . Symptomatic PVCs   . Vitamin D deficiency    Immunization History  Administered  Date(s) Administered  . Influenza Split 10/13/2013, 10/23/2014  . Influenza, High Dose Seasonal PF 01/09/2017, 10/27/2017  . Influenza,inj,quad, With Preservative 01/13/2013  . Influenza-Unspecified 10/07/2011  . PPD Test 10/13/2013, 10/23/2014  . Pneumococcal Conjugate-13 10/13/2013  . Pneumococcal Polysaccharide-23 10/23/2014  . Td 01/06/2006  . Tdap 03/06/2016  . Zoster 09/22/2011   Past Surgical History:  Procedure Laterality Date  . ACHILLES TENDON REPAIR     left  . APPENDECTOMY    . CARDIAC ELECTROPHYSIOLOGY MAPPING AND ABLATION    . COLONOSCOPY    . CORONARY ANGIOPLASTY WITH STENT PLACEMENT     years ago    FHx:    Reviewed / unchanged  SHx:    Reviewed / unchanged   Systems Review:  Constitutional: Denies fever, chills, wt changes, headaches, insomnia,  fatigue, night sweats, change in appetite. Eyes: Denies redness, blurred vision, diplopia, discharge, itchy, watery eyes.  ENT: Denies discharge, congestion, post nasal drip, epistaxis, sore throat, earache, hearing loss, dental pain, tinnitus, vertigo, sinus pain, snoring.  CV: Denies chest pain, palpitations, irregular heartbeat, syncope, dyspnea, diaphoresis, orthopnea, PND, claudication or edema. Respiratory: denies cough, dyspnea, DOE, pleurisy, hoarseness, laryngitis, wheezing.  Gastrointestinal: Denies dysphagia, odynophagia, heartburn, reflux, water brash, abdominal pain or cramps, nausea, vomiting, bloating, diarrhea, constipation, hematemesis, melena, hematochezia  or hemorrhoids. Genitourinary: Denies dysuria, frequency, urgency, nocturia, hesitancy, discharge, hematuria or flank pain. Musculoskeletal: Denies arthralgias, myalgias, stiffness, jt. swelling, pain, limping or strain/sprain.  Skin: Denies pruritus, rash, hives, warts, acne, eczema or change in skin lesion(s). Neuro: No weakness, tremor, incoordination, spasms, paresthesia or pain. Psychiatric: Denies confusion, memory loss or sensory loss. Endo: Denies change in weight, skin or hair change.  Heme/Lymph: No excessive bleeding, bruising or enlarged lymph nodes.  Physical Exam  BP 132/84   Pulse 72   Temp (!) 97.5 F (36.4 C)   Resp 16   Ht 5\' 10"  (1.778 m)   Wt 205 lb 3.2 oz (93.1 kg)   BMI 29.44 kg/m   Appears  well nourished, well groomed  and in no distress.  Eyes: PERRLA, EOMs, conjunctiva no swelling or erythema. Sinuses: No frontal/maxillary tenderness ENT/Mouth: EAC's clear, TM's nl w/o erythema, bulging. Nares clear w/o erythema, swelling, exudates. Oropharynx clear without erythema or exudates. Oral hygiene is good. Tongue normal, non obstructing. Hearing intact.  Neck: Supple. Thyroid not palpable. Car 2+/2+ without bruits, nodes or JVD. Chest: Respirations nl with BS clear & equal w/o rales, rhonchi,  wheezing or stridor.  Cor: Heart sounds normal w/ regular rate and rhythm without sig. murmurs, gallops, clicks or rubs. Peripheral pulses normal and equal  without edema.  Abdomen: Soft & bowel sounds normal. Non-tender w/o guarding, rebound, hernias, masses or organomegaly.  Lymphatics: Unremarkable.  Musculoskeletal: Full ROM all peripheral extremities, joint stability, 5/5 strength and normal gait.  Skin: Warm, dry without exposed rashes, lesions or ecchymosis apparent.  Neuro: Cranial nerves intact, reflexes equal bilaterally. Sensory-motor testing grossly intact. Tendon reflexes grossly intact.  Pysch: Alert & oriented x 3.  Insight and judgement nl & appropriate. No ideations.  Assessment and Plan:  1. Essential hypertension  - Continue medication, monitor blood pressure at home.  - Continue DASH diet.  Reminder to go to the ER if any CP,  SOB, nausea, dizziness, severe HA, changes vision/speech.  - CBC with Differential/Platelet - COMPLETE METABOLIC PANEL WITH GFR - Magnesium - TSH  2. Hyperlipidemia, mixed  - Continue diet/meds, exercise,& lifestyle modifications.  - Continue monitor periodic cholesterol/liver & renal functions   -  Lipid panel - TSH  3. Abnormal glucose  - Continue diet, exercise  - Lifestyle modifications.  - Monitor appropriate labs.  - Hemoglobin A1c - Insulin, random  4. Vitamin D deficiency  - Continue supplementation.  - VITAMIN D 25 Hydroxyl  5. Prediabetes  - Hemoglobin A1c - Insulin, random  6. Coronary artery disease involving native coronary artery of native heart without angina pectoris  - Lipid panel  7. Medication management  - CBC with Differential/Platelet - COMPLETE METABOLIC PANEL WITH GFR - Magnesium - Lipid panel - TSH - Hemoglobin A1c - Insulin, random - VITAMIN D 25 Hydroxy (Vit-D Deficiency, Fractures)  8. Depression  - sertraline (ZOLOFT) 100 MG tablet; Take 1 tablet daily for Mood  Dispense: 90  tablet; Refill: 3  9. Class 1 obesity due to excess calories with serious comorbidity and body mass index (BMI) of 34.0 to 34.9 in adult  - topiramate (TOPAMAX) 50 MG tablet; Take 1 tablet 2 x / day at Suppertime & Bedtime for Dieting & Weight Loss  Dispense: 180 tablet; Refill: 1  10. Testosterone deficiency  - Testosterone  11. Fatigue, unspecified type  - Testosterone       Discussed  regular exercise, BP monitoring, weight control to achieve/maintain BMI less than 25 and discussed med and SE's. Recommended labs to assess and monitor clinical status with further disposition pending results of labs.  I discussed the assessment and treatment plan with the patient. The patient was provided an opportunity to ask questions and all were answered. The patient agreed with the plan and demonstrated an understanding of the instructions.   I provided over 30 minutes of exam, counseling, chart review and  complex critical decision making.  Kirtland Bouchard, MD

## 2018-08-05 NOTE — Patient Instructions (Signed)

## 2018-08-06 LAB — COMPLETE METABOLIC PANEL WITH GFR
AG Ratio: 2 (calc) (ref 1.0–2.5)
ALT: 29 U/L (ref 9–46)
AST: 25 U/L (ref 10–35)
Albumin: 4.7 g/dL (ref 3.6–5.1)
Alkaline phosphatase (APISO): 64 U/L (ref 35–144)
BUN: 16 mg/dL (ref 7–25)
CO2: 26 mmol/L (ref 20–32)
Calcium: 9.9 mg/dL (ref 8.6–10.3)
Chloride: 103 mmol/L (ref 98–110)
Creat: 0.99 mg/dL (ref 0.70–1.25)
GFR, Est African American: 90 mL/min/{1.73_m2} (ref >=60)
GFR, Est Non African American: 78 mL/min/{1.73_m2} (ref >=60)
Globulin: 2.3 g/dL (calc) (ref 1.9–3.7)
Glucose, Bld: 104 mg/dL — ABNORMAL HIGH (ref 65–99)
Potassium: 4 mmol/L (ref 3.5–5.3)
Sodium: 138 mmol/L (ref 135–146)
Total Bilirubin: 0.6 mg/dL (ref 0.2–1.2)
Total Protein: 7 g/dL (ref 6.1–8.1)

## 2018-08-06 LAB — CBC WITH DIFFERENTIAL/PLATELET
Absolute Monocytes: 468 cells/uL (ref 200–950)
Basophils Absolute: 30 cells/uL (ref 0–200)
Basophils Relative: 0.5 %
Eosinophils Absolute: 348 cells/uL (ref 15–500)
Eosinophils Relative: 5.8 %
HCT: 47.8 % (ref 38.5–50.0)
Hemoglobin: 15.9 g/dL (ref 13.2–17.1)
Lymphs Abs: 1782 cells/uL (ref 850–3900)
MCH: 29.2 pg (ref 27.0–33.0)
MCHC: 33.3 g/dL (ref 32.0–36.0)
MCV: 87.9 fL (ref 80.0–100.0)
MPV: 10.6 fL (ref 7.5–12.5)
Monocytes Relative: 7.8 %
Neutro Abs: 3372 cells/uL (ref 1500–7800)
Neutrophils Relative %: 56.2 %
Platelets: 220 10*3/uL (ref 140–400)
RBC: 5.44 10*6/uL (ref 4.20–5.80)
RDW: 11.8 % (ref 11.0–15.0)
Total Lymphocyte: 29.7 %
WBC: 6 10*3/uL (ref 3.8–10.8)

## 2018-08-06 LAB — LIPID PANEL
Cholesterol: 159 mg/dL (ref <200)
HDL: 46 mg/dL (ref >=40)
LDL Cholesterol (Calc): 84 mg/dL (calc)
Non-HDL Cholesterol (Calc): 113 mg/dL (calc) (ref <130)
Total CHOL/HDL Ratio: 3.5 (calc) (ref <5.0)
Triglycerides: 195 mg/dL — ABNORMAL HIGH (ref <150)

## 2018-08-06 LAB — TESTOSTERONE: Testosterone: 675 ng/dL (ref 250–827)

## 2018-08-06 LAB — HEMOGLOBIN A1C
Hgb A1c MFr Bld: 5.4 % of total Hgb (ref <5.7)
Mean Plasma Glucose: 108 (calc)
eAG (mmol/L): 6 (calc)

## 2018-08-06 LAB — VITAMIN D 25 HYDROXY (VIT D DEFICIENCY, FRACTURES): Vit D, 25-Hydroxy: 63 ng/mL (ref 30–100)

## 2018-08-06 LAB — MAGNESIUM: Magnesium: 1.9 mg/dL (ref 1.5–2.5)

## 2018-08-06 LAB — TSH: TSH: 2.75 mIU/L (ref 0.40–4.50)

## 2018-08-06 LAB — INSULIN, RANDOM: Insulin: 22.9 u[IU]/mL — ABNORMAL HIGH

## 2018-08-10 ENCOUNTER — Other Ambulatory Visit: Payer: Self-pay | Admitting: Internal Medicine

## 2018-08-10 DIAGNOSIS — I2510 Atherosclerotic heart disease of native coronary artery without angina pectoris: Secondary | ICD-10-CM

## 2018-08-10 DIAGNOSIS — I10 Essential (primary) hypertension: Secondary | ICD-10-CM

## 2018-08-10 MED ORDER — NEBIVOLOL HCL 10 MG PO TABS: ORAL_TABLET | ORAL | 3 refills | Status: DC

## 2018-08-12 ENCOUNTER — Other Ambulatory Visit: Payer: Self-pay | Admitting: Internal Medicine

## 2018-08-12 DIAGNOSIS — E6609 Other obesity due to excess calories: Secondary | ICD-10-CM

## 2018-08-12 DIAGNOSIS — Z6834 Body mass index (BMI) 34.0-34.9, adult: Secondary | ICD-10-CM

## 2018-08-12 DIAGNOSIS — E669 Obesity, unspecified: Secondary | ICD-10-CM

## 2018-08-18 ENCOUNTER — Other Ambulatory Visit: Payer: Self-pay | Admitting: Internal Medicine

## 2018-09-01 ENCOUNTER — Other Ambulatory Visit: Payer: Self-pay | Admitting: Adult Health

## 2018-09-01 DIAGNOSIS — F5101 Primary insomnia: Secondary | ICD-10-CM

## 2018-09-14 ENCOUNTER — Other Ambulatory Visit: Payer: Self-pay | Admitting: Internal Medicine

## 2018-09-14 MED ORDER — VALACYCLOVIR HCL 500 MG PO TABS: ORAL_TABLET | ORAL | 1 refills | Status: DC

## 2018-09-19 NOTE — Progress Notes (Signed)
   Subjective:    Patient ID: Bryan [name], male    DOB: 08/13/1950, 68 y.o.   MRN: [id number]  HPI      Patient is a very nice 68 yo [name] with hx/o HTN, ASCAD, HLD, Pre DM, Vit D Deficiency & Moderate exogenous obesity (BMI 29.44) who was been on Phentermine sporadically and had Topamax added at last OV 6 weeks ago along with added Sertraline for some issues with depressed mood.       He reports he increased his Sertraline to 100 mg, but felt it made him more fatigued, so he decreased his dose back down to 1/2 tab (50 mg).       Also, he cut back on his Crestor dose to every 2 to 3 days & feels his fatigue improved.  He still endorses a tendency toward depressed mood, but denies any SI.   Medication Sig  . albuterol  HFA inhaler Use 2 inhalations 15 minutes apart every 4 hours if need to rescue Asthma  . Clopidogrel 75 MG  Take 1 tablet  daily.  . Eszopiclone 3 MG  Take 1/2 to 1 tablet at Bedtime Only if needed for Sleep  . ezetimibe 10 MG  Take 1 tablet  daily.  . nebivolol  10 MG  Take 1 tablet Daily for BP & Heart  . phentermine 37.5 MG  Take 1/2 to 1 tablet every Morning for Dieting & Weight Loss  . rosuvastatin 10 MG Take 1 tablet Daily for Cholesterol  . sertraline  100 MG  Take 1 tablet daily for Mood  . Tadalafil 20 MG  Take 1/2 to 1 tablet every 2 to 3 days as needed for XXXX  . telmisartan  80 MG TAKE 1 TABLET EVERY DAY  . topiramate  50 MG Take 1 tablet 2 x / day at Suppertime & Bedtime  . valACYclovir  500 MG  Take 1 tablet 2 x /day for Fever Blisters / Cold Sores   Allergies  Allergen Reactions  . Lisinopril Cough    ACEi cough  . Amoxicillin Hives and Swelling  . Penicillins Swelling   Review of Systems   10 point systems review negative except as above.    Objective:   Physical Exam  BP 122/76   Pulse 68   Temp (!) 97.5 F (36.4 C)   Resp 16   Ht 5\' 10"  (1.778 m)   Wt 203 lb 6.4 oz (92.3 kg)   BMI 29.18 kg/m   HEENT - WNL. Neck - supple.  Chest - Clear  equal BS. Cor - Nl HS. RRR w/o sig MGR. PP 1(+). No edema. MS- FROM w/o deformities.  Gait Nl. Neuro -  Nl w/o focal abnormalities. Psych - flat affect- forces smiles. A & O x 3. Judgement / Insight good.     Assessment & Plan:   1. Essential hypertension  - controlled  2. Prediabetes  - stable   3. Obesity (BMI 30.0-34.9)   4. Depression  - Patient is agreeable to try Wellbutrin as a "boost" to his Sertraline  - buPROPion (WELLBUTRIN XL) 150 MG 24 hr tablet; Take 1 tablet every Morning for Mood, Focus & Concentration  Dispense: 90 tablet; Refill: 3  5. Medication management  - Recommended taper Crestor 10 mg to 1/2 tab (5 mg) 3 x /week - MWF  - Has f/u appt in 6 weeks to recheck labs.

## 2018-09-20 ENCOUNTER — Encounter: Payer: Self-pay | Admitting: Internal Medicine

## 2018-09-20 ENCOUNTER — Other Ambulatory Visit: Payer: Self-pay

## 2018-09-20 ENCOUNTER — Ambulatory Visit (INDEPENDENT_AMBULATORY_CARE_PROVIDER_SITE_OTHER): Payer: Medicare Other | Admitting: Internal Medicine

## 2018-09-20 VITALS — BP 122/76 | HR 68 | Temp 97.5°F | Resp 16 | Ht 70.0 in | Wt 203.4 lb

## 2018-09-20 DIAGNOSIS — R7303 Prediabetes: Secondary | ICD-10-CM | POA: Diagnosis not present

## 2018-09-20 DIAGNOSIS — I10 Essential (primary) hypertension: Secondary | ICD-10-CM

## 2018-09-20 DIAGNOSIS — Z79899 Other long term (current) drug therapy: Secondary | ICD-10-CM

## 2018-09-20 DIAGNOSIS — I2510 Atherosclerotic heart disease of native coronary artery without angina pectoris: Secondary | ICD-10-CM

## 2018-09-20 DIAGNOSIS — E669 Obesity, unspecified: Secondary | ICD-10-CM | POA: Diagnosis not present

## 2018-09-20 DIAGNOSIS — F329 Major depressive disorder, single episode, unspecified: Secondary | ICD-10-CM | POA: Diagnosis not present

## 2018-09-20 DIAGNOSIS — F32A Depression, unspecified: Secondary | ICD-10-CM

## 2018-09-20 MED ORDER — BUPROPION HCL ER (XL) 150 MG PO TB24: ORAL_TABLET | ORAL | 3 refills | Status: DC

## 2018-10-11 ENCOUNTER — Other Ambulatory Visit: Payer: Self-pay | Admitting: Internal Medicine

## 2018-10-11 DIAGNOSIS — F5101 Primary insomnia: Secondary | ICD-10-CM

## 2018-11-05 ENCOUNTER — Ambulatory Visit: Payer: Medicare Other | Admitting: Physician Assistant

## 2018-11-06 ENCOUNTER — Other Ambulatory Visit: Payer: Self-pay | Admitting: Internal Medicine

## 2018-11-09 NOTE — Progress Notes (Signed)
FOLLOW UP  Assessment and Plan:   CAD Control blood pressure, cholesterol, glucose, increase exercise.  Followed by cardiology  Hypertension Well controlled with current medications after recheck, continue to monitor at home Monitor blood pressure at home; patient to call if consistently greater than 130/80 Continue DASH diet.   Reminder to go to the ER if any CP, SOB, nausea, dizziness, severe HA, changes vision/speech, left arm numbness and tingling and jaw pain.  Cholesterol LDL NOT AT GOAL OF LESS THAN 70, however myalgias with rosuvastatin 10 mg every other day, is also on zetia 10 mg daily   Continue low cholesterol diet and exercise.  Check lipid panel.   Other abnormal glucose Continue diet and exercise.  Perform daily foot/skin check, notify office of any concerning changes.  Check A1C, insulin levels at CPE; monitor weight, serum glucose  Overweight (BMI 29) Long discussion about weight loss, diet, and exercise Recommended diet heavy in fruits and veggies and low in animal meats, cheeses, and dairy products, appropriate calorie intake Discussed ideal weight for height Successful weight loss phentermine/topiramate, now off due to patient achieving weight loss goal of <200 lb on home scale Continue with lifestyle modification Will follow up in 3 months  Vitamin D Def Below goal at last visit; continue supplementation to maintain goal of 70-100 Check Vit D level  Insomnia Gabriel Earing is working well for him good sleep hygiene discussed  Depressed mood Patient feels improved with current medications; Continue as currently prescribed Lifestyle discussed: diet/exerise, sleep hygiene, stress management, hydration    Continue diet and meds as discussed. Further disposition pending results of labs. Discussed med's effects and SE's.   Over 30 minutes of exam, counseling, chart review, and critical decision making was performed.   Future Appointments  Date Time  Provider [place]  02/07/2019  9:00 AM [name] Pinto, MD [facility] None  02/11/2019 10:00 AM [name] Pinto, MD [facility] None  05/12/2019 10:00 AM [name], NP [facility] None    ----------------------------------------------------------------------------------------------------------------------  HPI 68 y.o. male  presents for 3 month follow up on hypertension, cholesterol, glucose management, obesity and vitamin D deficiency.  He reports back and hip pain have improved with injection with Dr. Ellene Route and Dr. Berenice Primas.   He is prescribed lunestra 3 mg PRN for sleep and reports this is working well for him. He has oral appliance for OSA   He recently reported depressed mood, poor motivation and energy since covid 19 and not being able to see mother whom he previously saw frequently, reports and is taking zoloft 100 mg every other day per Dr. Idell Pickles recommendation and wellbutrin 150 mg daily and reports working on lifestyle and feels has improved significantly.   he is prescribed phentermine and topamax for weight loss though reports is no longer taking as has achieved his weight goal of <200 lb on home scale, down from 221lb.  He reports took phentermine only every 2-3 days and did well with lower frequency.   BMI is Body mass index is 29.3 kg/m., he is working on diet and exercise, walking/cycling and doing weights most days of the week.  Wt Readings from Last 3 Encounters:  11/10/18 204 lb 3.2 oz (92.6 kg)  09/20/18 203 lb 6.4 oz (92.3 kg)  08/05/18 205 lb 3.2 oz (93.1 kg)   Patient has hx/o ASCAD with ACS/acute Anterior NSTEMI and PCA w/Stent implantation in 2003. He is on plavix. In 2004 , he had an Ablation for pAfib. Stress Myoview was negative in 2010.  His blood pressure has been controlled at home, he states it is good at home, today their BP is BP: 114/72  He does workout. He denies chest pain, shortness of breath, dizziness.   He is on cholesterol medication  (rosuvastatin 10 mg every other day due to fatigue, zetia 10 mg daily) and denies myalgias. His LDL cholesterol is not at goal of LDL <70. Trigs remain elevated despite omega 3 supplement. The cholesterol last visit was:   Lab Results  Component Value Date   CHOL 159 08/05/2018   HDL 46 08/05/2018   LDLCALC 84 08/05/2018   TRIG 195 (H) 08/05/2018   CHOLHDL 3.5 08/05/2018    He has not been working on diet and exercise for glucose management, and denies foot ulcerations, increased appetite, nausea, paresthesia of the feet, polydipsia, polyuria, visual disturbances, vomiting and weight loss. Last A1C in the office was:  Lab Results  Component Value Date   HGBA1C 5.4 08/05/2018   Patient is on Vitamin D supplement.   Lab Results  Component Value Date   VD25OH 63 08/05/2018        Current Medications:  Current Outpatient Medications on File Prior to Visit  Medication Sig  . albuterol (PROAIR HFA) 108 (90 Base) MCG/ACT inhaler Use 2 inhalations 15 minutes apart every 4 hours if need to rescue Asthma  . buPROPion (WELLBUTRIN XL) 150 MG 24 hr tablet Take 1 tablet every Morning for Mood, Focus & Concentration  . clopidogrel (PLAVIX) 75 MG tablet Take 1 tablet (75 mg total) by mouth daily.  . Eszopiclone 3 MG TABS Take 1/2-1 tablet at Bedtime ONLY if needed &  limit to 5 days /week to avoid addiction  . ezetimibe (ZETIA) 10 MG tablet Take 1 tablet (10 mg total) by mouth daily.  . nebivolol (BYSTOLIC) 10 MG tablet Take 1 tablet Daily for BP & Heart  . phentermine (ADIPEX-P) 37.5 MG tablet Take 1/2 to 1 tablet every Morning for Dieting & Weight Loss  . rosuvastatin (CRESTOR) 10 MG tablet Take 1 tablet Daily for Cholesterol  . sertraline (ZOLOFT) 100 MG tablet Take 1 tablet daily for Mood  . tadalafil (CIALIS) 20 MG tablet Take 1/2 to 1 tablet every 2 to 3 days as needed for XXXX  . telmisartan (MICARDIS) 80 MG tablet TAKE 1 TABLET(80 MG) BY MOUTH EVERY DAY  . topiramate (TOPAMAX) 50 MG tablet  Take 1 tablet 2 x / day at Suppertime & Bedtime for Dieting & Weight Loss  . valACYclovir (VALTREX) 500 MG tablet Take 1 tablet 2 x /day for Fever Blisters / Cold Sores   No current facility-administered medications on file prior to visit.      Allergies:  Allergies  Allergen Reactions  . Lisinopril Cough    ACEi cough  . Amoxicillin Hives and Swelling  . Penicillins Swelling     Medical History:  Past Medical History:  Diagnosis Date  . Allergy   . CAD (coronary artery disease), native coronary artery    Cath 04/19/2001  normal Left main, moderate proximal disease, 80% stenosis distal LAD, 99% stenosis proximal OM 1, 60% stenosis mid RCA, 70% stenosis ostial mid PDA  PCI with bare metal stent of OM1 same date 3.5 x 18 mm Multilink    . Diverticulosis   . GERD (gastroesophageal reflux disease)   . Hernia   . History of PSVT    Ablation done in 2004 at [facility]   . Hyperlipidemia   . Hypertension   .  Sleep apnea   . Symptomatic PVCs   . Vitamin D deficiency    Family history- Reviewed and unchanged Social history- Reviewed and unchanged   Review of Systems:  Review of Systems  Constitutional: Negative for malaise/fatigue and weight loss.  HENT: Negative for hearing loss and tinnitus.   Eyes: Negative for blurred vision and double vision.  Respiratory: Negative for cough, shortness of breath and wheezing.   Cardiovascular: Negative for chest pain, palpitations, orthopnea, claudication and leg swelling.  Gastrointestinal: Negative for abdominal pain, blood in stool, constipation, diarrhea, heartburn, melena, nausea and vomiting.  Genitourinary: Negative.   Musculoskeletal: Positive for back pain (Lumbar with radiation to hip, improved). Negative for falls, joint pain, myalgias and neck pain.  Skin: Negative for rash.  Neurological: Negative for dizziness, tingling, sensory change, weakness and headaches.  Endo/Heme/Allergies: Negative for polydipsia.   Psychiatric/Behavioral: Negative.   All other systems reviewed and are negative.   Physical Exam: BP 114/72   Pulse 65   Temp (!) 97.5 F (36.4 C)   Wt 204 lb 3.2 oz (92.6 kg)   SpO2 96%   BMI 29.30 kg/m  Wt Readings from Last 3 Encounters:  11/10/18 204 lb 3.2 oz (92.6 kg)  09/20/18 203 lb 6.4 oz (92.3 kg)  08/05/18 205 lb 3.2 oz (93.1 kg)   General Appearance: Well nourished, in no apparent distress. Eyes: PERRLA, EOMs, conjunctiva no swelling or erythema Sinuses: No Frontal/maxillary tenderness ENT/Mouth: Ext aud canals clear, TMs without erythema, bulging. No erythema, swelling, or exudate on post pharynx.  Tonsils not swollen or erythematous. Hearing normal.  Neck: Supple, thyroid normal.  Respiratory: Respiratory effort normal, BS equal bilaterally without rales, rhonchi, wheezing or stridor.  Cardio: RRR with no MRGs. Brisk peripheral pulses without edema.  Abdomen: Soft, + BS.  Non tender, no guarding, rebound, hernias, masses. Lymphatics: Non tender without lymphadenopathy.  Musculoskeletal: Full ROM, 5/5 strength, Normal gait.  Skin: Warm, dry without rashes, lesions, ecchymosis.  Neuro: Cranial nerves intact. No cerebellar symptoms.  Psych: Awake and oriented X 3, normal affect, Insight and Judgment appropriate.   Izora Ribas, NP 5:06 PM [facility]

## 2018-11-10 ENCOUNTER — Encounter: Payer: Self-pay | Admitting: Adult Health

## 2018-11-10 ENCOUNTER — Other Ambulatory Visit: Payer: Self-pay

## 2018-11-10 ENCOUNTER — Ambulatory Visit (INDEPENDENT_AMBULATORY_CARE_PROVIDER_SITE_OTHER): Payer: Medicare Other | Admitting: Adult Health

## 2018-11-10 VITALS — BP 114/72 | HR 65 | Temp 97.5°F | Wt 204.2 lb

## 2018-11-10 DIAGNOSIS — I10 Essential (primary) hypertension: Secondary | ICD-10-CM | POA: Diagnosis not present

## 2018-11-10 DIAGNOSIS — F5101 Primary insomnia: Secondary | ICD-10-CM | POA: Diagnosis not present

## 2018-11-10 DIAGNOSIS — R7309 Other abnormal glucose: Secondary | ICD-10-CM | POA: Diagnosis not present

## 2018-11-10 DIAGNOSIS — I2510 Atherosclerotic heart disease of native coronary artery without angina pectoris: Secondary | ICD-10-CM | POA: Diagnosis not present

## 2018-11-10 DIAGNOSIS — Z79899 Other long term (current) drug therapy: Secondary | ICD-10-CM | POA: Diagnosis not present

## 2018-11-10 DIAGNOSIS — E559 Vitamin D deficiency, unspecified: Secondary | ICD-10-CM | POA: Diagnosis not present

## 2018-11-10 DIAGNOSIS — F329 Major depressive disorder, single episode, unspecified: Secondary | ICD-10-CM | POA: Diagnosis not present

## 2018-11-10 DIAGNOSIS — E669 Obesity, unspecified: Secondary | ICD-10-CM

## 2018-11-10 DIAGNOSIS — F32A Depression, unspecified: Secondary | ICD-10-CM

## 2018-11-10 DIAGNOSIS — E782 Mixed hyperlipidemia: Secondary | ICD-10-CM | POA: Diagnosis not present

## 2018-11-10 MED ORDER — SERTRALINE HCL 100 MG PO TABS: ORAL_TABLET | ORAL | 1 refills | Status: DC

## 2018-11-11 LAB — COMPLETE METABOLIC PANEL WITH GFR
AG Ratio: 2.2 (calc) (ref 1.0–2.5)
ALT: 26 U/L (ref 9–46)
AST: 21 U/L (ref 10–35)
Albumin: 4.7 g/dL (ref 3.6–5.1)
Alkaline phosphatase (APISO): 69 U/L (ref 35–144)
BUN: 13 mg/dL (ref 7–25)
CO2: 27 mmol/L (ref 20–32)
Calcium: 9.9 mg/dL (ref 8.6–10.3)
Chloride: 103 mmol/L (ref 98–110)
Creat: 1.02 mg/dL (ref 0.70–1.25)
GFR, Est African American: 87 mL/min/{1.73_m2} (ref >=60)
GFR, Est Non African American: 75 mL/min/{1.73_m2} (ref >=60)
Globulin: 2.1 g/dL (calc) (ref 1.9–3.7)
Glucose, Bld: 84 mg/dL (ref 65–99)
Potassium: 4.3 mmol/L (ref 3.5–5.3)
Sodium: 139 mmol/L (ref 135–146)
Total Bilirubin: 0.5 mg/dL (ref 0.2–1.2)
Total Protein: 6.8 g/dL (ref 6.1–8.1)

## 2018-11-11 LAB — CBC WITH DIFFERENTIAL/PLATELET
Absolute Monocytes: 679 cells/uL (ref 200–950)
Basophils Absolute: 37 cells/uL (ref 0–200)
Basophils Relative: 0.5 %
Eosinophils Absolute: 299 cells/uL (ref 15–500)
Eosinophils Relative: 4.1 %
HCT: 48 % (ref 38.5–50.0)
Hemoglobin: 16 g/dL (ref 13.2–17.1)
Lymphs Abs: 2051 cells/uL (ref 850–3900)
MCH: 29.4 pg (ref 27.0–33.0)
MCHC: 33.3 g/dL (ref 32.0–36.0)
MCV: 88.1 fL (ref 80.0–100.0)
MPV: 10.6 fL (ref 7.5–12.5)
Monocytes Relative: 9.3 %
Neutro Abs: 4234 cells/uL (ref 1500–7800)
Neutrophils Relative %: 58 %
Platelets: 224 10*3/uL (ref 140–400)
RBC: 5.45 10*6/uL (ref 4.20–5.80)
RDW: 12.3 % (ref 11.0–15.0)
Total Lymphocyte: 28.1 %
WBC: 7.3 10*3/uL (ref 3.8–10.8)

## 2018-11-11 LAB — LIPID PANEL
Cholesterol: 170 mg/dL (ref <200)
HDL: 42 mg/dL (ref >=40)
Non-HDL Cholesterol (Calc): 128 mg/dL (calc) (ref <130)
Total CHOL/HDL Ratio: 4 (calc) (ref <5.0)
Triglycerides: 433 mg/dL — ABNORMAL HIGH (ref <150)

## 2018-11-11 LAB — MAGNESIUM: Magnesium: 2.1 mg/dL (ref 1.5–2.5)

## 2018-11-11 LAB — TSH: TSH: 3.64 mIU/L (ref 0.40–4.50)

## 2018-11-17 ENCOUNTER — Other Ambulatory Visit: Payer: Self-pay | Admitting: Internal Medicine

## 2018-11-17 DIAGNOSIS — F5101 Primary insomnia: Secondary | ICD-10-CM

## 2018-11-17 DIAGNOSIS — H2513 Age-related nuclear cataract, bilateral: Secondary | ICD-10-CM | POA: Diagnosis not present

## 2018-11-17 DIAGNOSIS — H00025 Hordeolum internum left lower eyelid: Secondary | ICD-10-CM | POA: Diagnosis not present

## 2018-11-17 DIAGNOSIS — H0102B Squamous blepharitis left eye, upper and lower eyelids: Secondary | ICD-10-CM | POA: Diagnosis not present

## 2018-11-17 DIAGNOSIS — H1012 Acute atopic conjunctivitis, left eye: Secondary | ICD-10-CM | POA: Diagnosis not present

## 2018-11-17 DIAGNOSIS — H0102A Squamous blepharitis right eye, upper and lower eyelids: Secondary | ICD-10-CM | POA: Diagnosis not present

## 2018-11-17 DIAGNOSIS — L719 Rosacea, unspecified: Secondary | ICD-10-CM | POA: Diagnosis not present

## 2018-12-21 ENCOUNTER — Other Ambulatory Visit: Payer: Self-pay | Admitting: Internal Medicine

## 2018-12-21 DIAGNOSIS — F5101 Primary insomnia: Secondary | ICD-10-CM

## 2019-01-14 ENCOUNTER — Other Ambulatory Visit: Payer: Self-pay | Admitting: Internal Medicine

## 2019-01-14 ENCOUNTER — Other Ambulatory Visit: Payer: Self-pay | Admitting: Adult Health

## 2019-01-14 DIAGNOSIS — E782 Mixed hyperlipidemia: Secondary | ICD-10-CM

## 2019-01-14 DIAGNOSIS — I10 Essential (primary) hypertension: Secondary | ICD-10-CM

## 2019-02-06 ENCOUNTER — Encounter: Payer: Self-pay | Admitting: Internal Medicine

## 2019-02-06 DIAGNOSIS — I708 Atherosclerosis of other arteries: Secondary | ICD-10-CM | POA: Insufficient documentation

## 2019-02-06 DIAGNOSIS — I700 Atherosclerosis of aorta: Secondary | ICD-10-CM | POA: Insufficient documentation

## 2019-02-06 NOTE — Progress Notes (Signed)
Annual  Screening/Preventative Visit  & Comprehensive Evaluation & Examination     This very nice 69 y.o. [name] presents for a Screening /Preventative Visit & comprehensive evaluation and management of multiple medical co-morbidities.  Patient has been followed for HTN, ASCAD/Stents, HLD, Prediabetes and Vitamin D Deficiency. Patient has atherosclerosis of the Iliac Aa by Abd/pelvic in 2018.      Patient has OSA and  hx/o chronic poor sleep hygiene also with RLS and has hx/o CPAP mask intolerance. Patient had an oral appliance which he feels dislodged a left upper 1st premolar or bicuspid tooth and desires a  re-evaluation sleep study to assess his need for CPAP.     In Aug 2019, patient was seen by Dr Ellene Route for Cx DDD and has EDSI for Cx radiculitis. More recently , he reports a 2-3 month prodrome of neck pains with flexion/extension and also pains to the Left hand & fingers.     His HTN predates since 51.  In 2003, patient presented with ACS and NSTEMI& has PCA/Stent rescue. Patient's BP has been controlled at home.  Today's BP is at goal - 122/82.   In 2004 & 2010, he had ablations for pAfib.  Patient denies any recent  cardiac symptoms as chest pain, palpitations, shortness of breath, dizziness or ankle swelling.     Patient's hyperlipidemia is controlled with diet and medications. Patient denies myalgias or other medication SE's. Last lipids were at goal except elevated Triglycerides:  Lab Results  Component Value Date   CHOL 170 11/10/2018   HDL 42 11/10/2018   LDLCALC not calculated 11/10/2018   TRIG 433 (H) 11/10/2018   CHOLHDL 4.0 11/10/2018      Patient has hx/o prediabetes (A1c 5.8% / Oct 2019)  and patient denies reactive hypoglycemic symptoms, visual blurring, diabetic polys or paresthesias. Last A1c was Normal & at goal:  Lab Results  Component Value Date   HGBA1C 5.4 08/05/2018    Wt Readings from Last 3 Encounters:  02/07/19 213 lb (96.6 kg)  11/10/18 204 lb 3.2 oz  (92.6 kg)  09/20/18 203 lb 6.4 oz (92.3 kg)       Finally, patient has history of Vitamin D Deficiency ("26" / 2008)  and last vitamin D was at goal:  Lab Results  Component Value Date   VD25OH 63 08/05/2018   Current Outpatient Medications on File Prior to Visit  Medication Sig  . albuterol (PROAIR HFA) 108 (90 Base) MCG/ACT inhaler Use 2 inhalations 15 minutes apart every 4 hours if need to rescue Asthma  . buPROPion (WELLBUTRIN XL) 150 MG 24 hr tablet Take 1 tablet every Morning for Mood, Focus & Concentration  . clopidogrel (PLAVIX) 75 MG tablet Take 1 tablet (75 mg total) by mouth daily.  . Eszopiclone 3 MG TABS Take 1/2-1 tablet at Bedtime ONLY if needed for Sleep &  limit to 5 days /week to avoid addiction  . ezetimibe (ZETIA) 10 MG tablet Take 1 tablet Daily for Cholesterol  . nebivolol (BYSTOLIC) 10 MG tablet Take 1 tablet Daily for BP & Heart  . rosuvastatin (CRESTOR) 10 MG tablet Take 1 tablet Daily for Cholesterol  . sertraline (ZOLOFT) 100 MG tablet Take 0.5 tablet daily for Mood  . tadalafil (CIALIS) 20 MG tablet Take 1/2 to 1 tablet every 2 to 3 days as needed for XXXX  . telmisartan (MICARDIS) 80 MG tablet Take 1 tablet Daily for BP  . valACYclovir (VALTREX) 500 MG tablet Take 1 tablet 2  x /day for Fever Blisters / Cold Sores   No current facility-administered medications on file prior to visit.   Allergies  Allergen Reactions  . Lisinopril Cough    ACEi cough  . Amoxicillin Hives and Swelling  . Penicillins Swelling   Past Medical History:  Diagnosis Date  . Allergy   . CAD (coronary artery disease), native coronary artery    Cath 04/19/2001  normal Left main, moderate proximal disease, 80% stenosis distal LAD, 99% stenosis proximal OM 1, 60% stenosis mid RCA, 70% stenosis ostial mid PDA  PCI with bare metal stent of OM1 same date 3.5 x 18 mm Multilink    . Diverticulosis   . GERD (gastroesophageal reflux disease)   . Hernia   . History of PSVT    Ablation  done in 2004 at [facility]   . Hyperlipidemia   . Hypertension   . Sleep apnea   . Symptomatic PVCs   . Vitamin D deficiency    Health Maintenance  Topic Date Due  . INFLUENZA VACCINE  08/07/2018  . PNA vac Low Risk Adult (2 of 2 - PPSV23) 10/23/2019  . COLONOSCOPY  05/25/2021  . TETANUS/TDAP  03/07/2026  . Hepatitis C Screening  Completed   Immunization History  Administered Date(s) Administered  . Influenza Split 10/13/2013, 10/23/2014  . Influenza, High Dose Seasonal PF 01/09/2017, 10/27/2017  . Influenza,inj,quad, With Preservative 01/13/2013  . Influenza-Unspecified 10/07/2011  . PPD Test 10/13/2013, 10/23/2014  . Pneumococcal Conjugate-13 10/13/2013  . Pneumococcal Polysaccharide-23 10/23/2014  . Td 01/06/2006  . Tdap 03/06/2016  . Zoster 09/22/2011   Last Colon - 05/26/2018 - Dr Hilarie Fredrickson - Recc 3 yr f/u Colon - due June 2023  Past Surgical History:  Procedure Laterality Date  . ACHILLES TENDON REPAIR     left  . APPENDECTOMY    . CARDIAC ELECTROPHYSIOLOGY MAPPING AND ABLATION    . COLONOSCOPY    . CORONARY ANGIOPLASTY WITH STENT PLACEMENT     years ago   Family History  Problem Relation Age of Onset  . Brain cancer Father   . Hypothyroidism Mother   . Irritable bowel syndrome Mother   . Colonic polyp Mother        PRE-malignant polyp of rectosigmoid colon  . Colon polyps Mother   . Esophageal cancer Neg Hx   . Stomach cancer Neg Hx   . Pancreatic cancer Neg Hx   . Liver disease Neg Hx   . Colon cancer Neg Hx   . Rectal cancer Neg Hx    Social History   Socioeconomic History  . Marital status: Married    Spouse name: Katrina  . Number of children: 3  Occupational History  . Retired  Tobacco Use  . Smoking status: Never Smoker  . Smokeless tobacco: Never Used  Substance and Sexual Activity  . Alcohol use: Yes    Comment: rarely  . Drug use: No  . Sexual activity: Not on file  Social History Narrative   Drinks 2 cups of coffee a day       ROS Constitutional: Denies fever, chills, weight loss/gain, headaches, insomnia,  night sweats or change in appetite. Does c/o fatigue. Eyes: Denies redness, blurred vision, diplopia, discharge, itchy or watery eyes.  ENT: Denies discharge, congestion, post nasal drip, epistaxis, sore throat, earache, hearing loss, dental pain, Tinnitus, Vertigo, Sinus pain or snoring.  Cardio: Denies chest pain, palpitations, irregular heartbeat, syncope, dyspnea, diaphoresis, orthopnea, PND, claudication or edema Respiratory: denies cough, dyspnea, DOE,  pleurisy, hoarseness, laryngitis or wheezing.  Gastrointestinal: Denies dysphagia, heartburn, reflux, water brash, pain, cramps, nausea, vomiting, bloating, diarrhea, constipation, hematemesis, melena, hematochezia, jaundice or hemorrhoids Genitourinary: Denies dysuria, frequency, discharge, hematuria or flank pain. Has urgency, slow urinary stream, nocturia x 2-3 & occasional hesitancy. Musculoskeletal: Denies arthralgia, myalgia, stiffness, Jt. Swelling, pain, limp or strain/sprain. Denies Falls. Skin: Denies puritis, rash, hives, warts, acne, eczema or change in skin lesion Neuro: No weakness, tremor, incoordination, spasms, paresthesia or pain Psychiatric: Denies confusion, memory loss or sensory loss. Denies Depression. Endocrine: Denies change in weight, skin, hair change, nocturia, and paresthesia, diabetic polys, visual blurring or hyper / hypo glycemic episodes.  Heme/Lymph: No excessive bleeding, bruising or enlarged lymph nodes.  Physical Exam  BP 122/82   Pulse 80   Temp (!) 97.5 F (36.4 C)   Resp 16   Ht 5\' 9"  (1.753 m)   Wt 213 lb (96.6 kg)   BMI 31.45 kg/m   General Appearance: Well nourished and well groomed and in no apparent distress.  Eyes: PERRLA, EOMs, conjunctiva no swelling or erythema, normal fundi and vessels. Sinuses: No frontal/maxillary tenderness ENT/Mouth: EACs patent / TMs  nl. Nares clear without erythema,  swelling, mucoid exudates. Oral hygiene is good. No erythema, swelling, or exudate. Tongue normal, non-obstructing. Tonsils not swollen or erythematous. Hearing normal.  Neck: Supple, thyroid not palpable. No bruits, nodes or JVD. Respiratory: Respiratory effort normal.  BS equal and clear bilateral without rales, rhonci, wheezing or stridor. Cardio: Heart sounds are normal with regular rate and rhythm and no murmurs, rubs or gallops. Peripheral pulses are normal and equal bilaterally without edema. No aortic or femoral bruits. Chest: symmetric with normal excursions and percussion.  Abdomen: Soft, with Nl bowel sounds. Nontender, no guarding, rebound, hernias, masses, or organomegaly.  Lymphatics: Non tender without lymphadenopathy.  Musculoskeletal: Full ROM all peripheral extremities, joint stability, 5/5 strength, and normal gait. Skin: Warm and dry without rashes, lesions, cyanosis, clubbing or  ecchymosis.  Neuro: Cranial nerves intact, reflexes equal bilaterally. Normal muscle tone, no cerebellar symptoms. Sensation intact.  Pysch: Alert and oriented X 3 with normal affect, insight and judgment appropriate.   Assessment and Plan  1. Essential hypertension  - [place], RETROPERITNL ABD,  LTD - Urinalysis, Routine w reflex microscopic - Microalbumin / creatinine urine ratio - CBC with Differential/Platelet - COMPLETE METABOLIC PANEL WITH GFR - Magnesium - TSH - EKG 12-Lead  2. Hyperlipidemia, mixed  - [place], RETROPERITNL ABD,  LTD - Lipid panel - TSH - EKG 12-Lead  3. Abnormal glucose  - [place], RETROPERITNL ABD,  LTD - Hemoglobin A1c - Insulin, random - EKG 12-Lead  4. Vitamin D deficiency  - VITAMIN D 25 Hydroxy   5. Coronary artery disease involving native coronary artery of native heart without angina pectoris  - Lipid panel - EKG 12-Lead  6. BPH with obstruction/lower urinary tract symptoms  - PSA  7. OSA (obstructive sleep apnea)   8. Primary insomnia   9.  Obesity (BMI 30.0-34.9)   10. Prostate cancer screening  - PSA  11. Screening for ischemic heart disease  - EKG 12-Lead  12. FH: hypertension  - [place], RETROPERITNL ABD,  LTD - EKG 12-Lead  13. Aorto-iliac atherosclerosis (HCC)  - [place], RETROPERITNL ABD,  LTD - EKG 12-Lead  14. Screening for AAA (aortic abdominal aneurysm)  - [place], RETROPERITNL ABD,  LTD  15. Medication management  - Urinalysis, Routine w reflex microscopic - Microalbumin / creatinine urine ratio - CBC with  Differential/Platelet - COMPLETE METABOLIC PANEL WITH GFR - Magnesium - Lipid panel - TSH - Hemoglobin A1c - Insulin, random - VITAMIN D 25 Hydroxy          Patient was counseled in prudent diet, weight control to achieve/maintain BMI less than 25, BP monitoring, regular exercise and medications as discussed.  Sent new Rx's for Finasteride & Tamsulosin for his LUTS.  Discussed med effects and SE's. Routine screening labs and tests as requested with regular follow-up as recommended. Over 40 minutes of exam, counseling, chart review and high complex critical decision making was performed   Kirtland Bouchard, MD

## 2019-02-06 NOTE — Patient Instructions (Signed)

## 2019-02-07 ENCOUNTER — Other Ambulatory Visit: Payer: Self-pay

## 2019-02-07 ENCOUNTER — Ambulatory Visit (INDEPENDENT_AMBULATORY_CARE_PROVIDER_SITE_OTHER): Payer: Medicare Other | Admitting: Internal Medicine

## 2019-02-07 VITALS — BP 122/82 | HR 80 | Temp 97.5°F | Resp 16 | Ht 69.0 in | Wt 213.0 lb

## 2019-02-07 DIAGNOSIS — I2510 Atherosclerotic heart disease of native coronary artery without angina pectoris: Secondary | ICD-10-CM

## 2019-02-07 DIAGNOSIS — M5412 Radiculopathy, cervical region: Secondary | ICD-10-CM | POA: Diagnosis not present

## 2019-02-07 DIAGNOSIS — E669 Obesity, unspecified: Secondary | ICD-10-CM | POA: Diagnosis not present

## 2019-02-07 DIAGNOSIS — Z125 Encounter for screening for malignant neoplasm of prostate: Secondary | ICD-10-CM

## 2019-02-07 DIAGNOSIS — F5101 Primary insomnia: Secondary | ICD-10-CM

## 2019-02-07 DIAGNOSIS — G4733 Obstructive sleep apnea (adult) (pediatric): Secondary | ICD-10-CM

## 2019-02-07 DIAGNOSIS — R7309 Other abnormal glucose: Secondary | ICD-10-CM

## 2019-02-07 DIAGNOSIS — N401 Enlarged prostate with lower urinary tract symptoms: Secondary | ICD-10-CM | POA: Diagnosis not present

## 2019-02-07 DIAGNOSIS — I700 Atherosclerosis of aorta: Secondary | ICD-10-CM

## 2019-02-07 DIAGNOSIS — N138 Other obstructive and reflux uropathy: Secondary | ICD-10-CM

## 2019-02-07 DIAGNOSIS — Z136 Encounter for screening for cardiovascular disorders: Secondary | ICD-10-CM

## 2019-02-07 DIAGNOSIS — E782 Mixed hyperlipidemia: Secondary | ICD-10-CM | POA: Diagnosis not present

## 2019-02-07 DIAGNOSIS — I708 Atherosclerosis of other arteries: Secondary | ICD-10-CM

## 2019-02-07 DIAGNOSIS — Z79899 Other long term (current) drug therapy: Secondary | ICD-10-CM

## 2019-02-07 DIAGNOSIS — E559 Vitamin D deficiency, unspecified: Secondary | ICD-10-CM

## 2019-02-07 DIAGNOSIS — I10 Essential (primary) hypertension: Secondary | ICD-10-CM | POA: Diagnosis not present

## 2019-02-07 DIAGNOSIS — Z8249 Family history of ischemic heart disease and other diseases of the circulatory system: Secondary | ICD-10-CM | POA: Diagnosis not present

## 2019-02-07 MED ORDER — FINASTERIDE 5 MG PO TABS: ORAL_TABLET | ORAL | 3 refills | Status: DC

## 2019-02-07 MED ORDER — TAMSULOSIN HCL 0.4 MG PO CAPS: ORAL_CAPSULE | ORAL | 3 refills | Status: DC

## 2019-02-08 LAB — URINALYSIS, ROUTINE W REFLEX MICROSCOPIC
Bilirubin Urine: NEGATIVE
Glucose, UA: NEGATIVE
Hgb urine dipstick: NEGATIVE
Ketones, ur: NEGATIVE
Leukocytes,Ua: NEGATIVE
Nitrite: NEGATIVE
Protein, ur: NEGATIVE
Specific Gravity, Urine: 1.018 (ref 1.001–1.03)
pH: <=5 (ref 5.0–8.0)

## 2019-02-08 LAB — COMPLETE METABOLIC PANEL WITH GFR
AG Ratio: 1.9 (calc) (ref 1.0–2.5)
ALT: 27 U/L (ref 9–46)
AST: 27 U/L (ref 10–35)
Albumin: 4.3 g/dL (ref 3.6–5.1)
Alkaline phosphatase (APISO): 66 U/L (ref 35–144)
BUN: 14 mg/dL (ref 7–25)
CO2: 27 mmol/L (ref 20–32)
Calcium: 9.5 mg/dL (ref 8.6–10.3)
Chloride: 105 mmol/L (ref 98–110)
Creat: 1.02 mg/dL (ref 0.70–1.25)
GFR, Est African American: 87 mL/min/{1.73_m2} (ref >=60)
GFR, Est Non African American: 75 mL/min/{1.73_m2} (ref >=60)
Globulin: 2.3 g/dL (calc) (ref 1.9–3.7)
Glucose, Bld: 103 mg/dL — ABNORMAL HIGH (ref 65–99)
Potassium: 4.5 mmol/L (ref 3.5–5.3)
Sodium: 139 mmol/L (ref 135–146)
Total Bilirubin: 0.5 mg/dL (ref 0.2–1.2)
Total Protein: 6.6 g/dL (ref 6.1–8.1)

## 2019-02-08 LAB — CBC WITH DIFFERENTIAL/PLATELET
Absolute Monocytes: 458 cells/uL (ref 200–950)
Basophils Absolute: 42 cells/uL (ref 0–200)
Basophils Relative: 0.8 %
Eosinophils Absolute: 390 cells/uL (ref 15–500)
Eosinophils Relative: 7.5 %
HCT: 46.4 % (ref 38.5–50.0)
Hemoglobin: 15.8 g/dL (ref 13.2–17.1)
Lymphs Abs: 1602 cells/uL (ref 850–3900)
MCH: 29.5 pg (ref 27.0–33.0)
MCHC: 34.1 g/dL (ref 32.0–36.0)
MCV: 86.7 fL (ref 80.0–100.0)
MPV: 10.2 fL (ref 7.5–12.5)
Monocytes Relative: 8.8 %
Neutro Abs: 2709 cells/uL (ref 1500–7800)
Neutrophils Relative %: 52.1 %
Platelets: 204 10*3/uL (ref 140–400)
RBC: 5.35 10*6/uL (ref 4.20–5.80)
RDW: 12.1 % (ref 11.0–15.0)
Total Lymphocyte: 30.8 %
WBC: 5.2 10*3/uL (ref 3.8–10.8)

## 2019-02-08 LAB — MICROALBUMIN / CREATININE URINE RATIO
Creatinine, Urine: 121 mg/dL (ref 20–320)
Microalb Creat Ratio: 2 mcg/mg creat (ref <30)
Microalb, Ur: 0.2 mg/dL

## 2019-02-08 LAB — LIPID PANEL
Cholesterol: 153 mg/dL (ref <200)
HDL: 39 mg/dL — ABNORMAL LOW (ref >=40)
Non-HDL Cholesterol (Calc): 114 mg/dL (calc) (ref <130)
Total CHOL/HDL Ratio: 3.9 (calc) (ref <5.0)
Triglycerides: 452 mg/dL — ABNORMAL HIGH (ref <150)

## 2019-02-08 LAB — MAGNESIUM: Magnesium: 2.1 mg/dL (ref 1.5–2.5)

## 2019-02-08 LAB — INSULIN, RANDOM: Insulin: 28.8 u[IU]/mL — ABNORMAL HIGH

## 2019-02-08 LAB — TSH: TSH: 2.48 mIU/L (ref 0.40–4.50)

## 2019-02-08 LAB — HEMOGLOBIN A1C
Hgb A1c MFr Bld: 5.6 % of total Hgb (ref <5.7)
Mean Plasma Glucose: 114 (calc)
eAG (mmol/L): 6.3 (calc)

## 2019-02-08 LAB — PSA: PSA: 0.6 ng/mL (ref <=4.0)

## 2019-02-08 LAB — VITAMIN D 25 HYDROXY (VIT D DEFICIENCY, FRACTURES): Vit D, 25-Hydroxy: 54 ng/mL (ref 30–100)

## 2019-02-10 ENCOUNTER — Other Ambulatory Visit: Payer: Self-pay | Admitting: Internal Medicine

## 2019-02-10 DIAGNOSIS — F5101 Primary insomnia: Secondary | ICD-10-CM

## 2019-02-11 ENCOUNTER — Encounter: Payer: Medicare Other | Admitting: Internal Medicine

## 2019-02-14 ENCOUNTER — Other Ambulatory Visit: Payer: Self-pay | Admitting: Internal Medicine

## 2019-02-14 DIAGNOSIS — M5412 Radiculopathy, cervical region: Secondary | ICD-10-CM

## 2019-02-15 ENCOUNTER — Other Ambulatory Visit: Payer: Self-pay | Admitting: Internal Medicine

## 2019-02-15 DIAGNOSIS — M5030 Other cervical disc degeneration, unspecified cervical region: Secondary | ICD-10-CM

## 2019-02-20 ENCOUNTER — Other Ambulatory Visit: Payer: Self-pay | Admitting: Internal Medicine

## 2019-02-20 DIAGNOSIS — E6609 Other obesity due to excess calories: Secondary | ICD-10-CM

## 2019-02-20 DIAGNOSIS — E669 Obesity, unspecified: Secondary | ICD-10-CM

## 2019-02-23 ENCOUNTER — Other Ambulatory Visit: Payer: Self-pay | Admitting: Internal Medicine

## 2019-02-23 MED ORDER — TOPIRAMATE 50 MG PO TABS: ORAL_TABLET | ORAL | 1 refills | Status: DC

## 2019-02-23 MED ORDER — PHENTERMINE HCL 37.5 MG PO TABS: ORAL_TABLET | ORAL | 1 refills | Status: DC

## 2019-03-01 ENCOUNTER — Ambulatory Visit
Admission: RE | Admit: 2019-03-01 | Discharge: 2019-03-01 | Disposition: A | Discharge status: Home / Self Care | Payer: Medicare Other | Source: Ambulatory Visit | Attending: Internal Medicine | Admitting: Internal Medicine

## 2019-03-01 ENCOUNTER — Other Ambulatory Visit: Payer: Self-pay

## 2019-03-01 DIAGNOSIS — M4802 Spinal stenosis, cervical region: Secondary | ICD-10-CM | POA: Diagnosis not present

## 2019-03-01 DIAGNOSIS — M5030 Other cervical disc degeneration, unspecified cervical region: Secondary | ICD-10-CM

## 2019-03-01 NOTE — Progress Notes (Signed)
FYI   - Neck MRI shows changes likely consistent with the Left upper extremity / hand & finger radicular pains that you've been experiencing   - Keep scheduled appointment at Dr Clarice Pole office  for evaluation to determine the best treatment(s) - either steroid shots in the neck or possibly surgery

## 2019-03-09 ENCOUNTER — Encounter: Payer: Self-pay | Admitting: Neurology

## 2019-03-16 DIAGNOSIS — M48062 Spinal stenosis, lumbar region with neurogenic claudication: Secondary | ICD-10-CM | POA: Diagnosis not present

## 2019-03-16 DIAGNOSIS — M4722 Other spondylosis with radiculopathy, cervical region: Secondary | ICD-10-CM | POA: Diagnosis not present

## 2019-03-17 ENCOUNTER — Encounter: Payer: Self-pay | Admitting: Internal Medicine

## 2019-03-22 ENCOUNTER — Other Ambulatory Visit: Payer: Self-pay

## 2019-03-22 DIAGNOSIS — F5101 Primary insomnia: Secondary | ICD-10-CM

## 2019-03-24 ENCOUNTER — Other Ambulatory Visit: Payer: Self-pay | Admitting: Internal Medicine

## 2019-03-24 DIAGNOSIS — F5101 Primary insomnia: Secondary | ICD-10-CM

## 2019-03-24 MED ORDER — ESZOPICLONE 3 MG PO TABS: ORAL_TABLET | ORAL | 0 refills | Status: DC

## 2019-03-31 ENCOUNTER — Encounter: Payer: Self-pay | Admitting: Neurology

## 2019-03-31 ENCOUNTER — Ambulatory Visit (INDEPENDENT_AMBULATORY_CARE_PROVIDER_SITE_OTHER): Payer: Medicare Other | Admitting: Neurology

## 2019-03-31 ENCOUNTER — Other Ambulatory Visit: Payer: Self-pay

## 2019-03-31 VITALS — BP 112/72 | HR 94 | Temp 97.0°F | Ht 70.0 in | Wt 215.0 lb

## 2019-03-31 DIAGNOSIS — Z789 Other specified health status: Secondary | ICD-10-CM

## 2019-03-31 DIAGNOSIS — F5104 Psychophysiologic insomnia: Secondary | ICD-10-CM | POA: Diagnosis not present

## 2019-03-31 DIAGNOSIS — K009 Disorder of tooth development, unspecified: Secondary | ICD-10-CM | POA: Diagnosis not present

## 2019-03-31 DIAGNOSIS — I708 Atherosclerosis of other arteries: Secondary | ICD-10-CM

## 2019-03-31 DIAGNOSIS — G4733 Obstructive sleep apnea (adult) (pediatric): Secondary | ICD-10-CM

## 2019-03-31 DIAGNOSIS — I700 Atherosclerosis of aorta: Secondary | ICD-10-CM | POA: Diagnosis not present

## 2019-03-31 MED ORDER — TRAZODONE HCL 50 MG PO TABS: 50.0000 mg | ORAL_TABLET | Freq: Every evening | ORAL | 2 refills | Status: DC | PRN

## 2019-03-31 NOTE — Progress Notes (Signed)
SLEEP MED[facility]INE CLINIC   Provider:  [name], M D  Referring Provider: [name] Pinto, MD Primary Care Physician:  [name] Pinto, MD  Chief Complaint  Patient presents with  . Follow-up    pt alone, rm 11. pt presents today, he had attempted CPAP but was unable to tolerate, then was set up on a dental device and it was uncomfortable, causing jaw pain as well as not treating the apnea effectively per Dr Ron Parker study. pt is wanting to discuss alternatives  . Other    pt also has concerns over not getting good sleep, he was wanting to discuss possibly sleep aid. states on a good night he averages 5 hrs. stated tried generic lunesta and doesnt help    RV on 03-31-2019, Bryan [name], 69 years of age, and a patient with OSA and failed PAP therapy- he has been vaccinated for Covid, both shots, Moderna. Dr Ron Parker made a dental device that has only been partially effective as there is REM dependent apnea. We meet to re cap and look for alternatives. Bryan [name] has had trouble with his front teeth especially his left canine, and he relates the breakage of the tooth to the difficulties and resistance of the dental device he wore.  So he is reluctant to get back on it but also because it can cause jaw pain as well as not treating his apnea effectively.  Dr. Ron Parker' had shown that there was still a high residual AHI. Dr. Ron Parker allowed me to review his diagnostic report this was a ResMed device, his so home sleep test was done by [facility].  Supine AHI 30.8 of which apneas were 17.9, he slept 1 hour 10 minutes in supine so this was a big part of the sleep study.  Nonsupine sleep AHI 1.92 apneas 0.3/h.  Total events all obstructive no Cheyne-Stokes respiration no prolonged oxygen desaturation heart rate variability was great between 45 bpm and 148 bpm but this is notoriously artifact prone in any home sleep test.  Bryan [name] also endorsed the fatigue severity scale today at 53 out  of 63 points which is high, his sleep has not been refreshing or restorative and is highly fragmented.  He endorsed the Epworth Sleepiness Scale at 8 points out of 24.  I would like to recap his sleep study from 10 January 2016 at the time he only slept 68% of the recorded time in the lab his AHI was 21.6 and he did not reach REM sleep.  The problem is that we could not evaluate REM sleep dependent apnea in a patient who never entered REM sleep in the first place.  He returned for full night titration on 30 January the same year his sleep efficiency the first night was 91.6% his AHI reduced to 0.9 this time REM sleep was entered and during REM sleep he still has a 3.8 apneas per hour  He often lost the CPAP interface in the night- the snoring was gone while he used the CPAP and the dental device.  Still had REM dependent apneas- he has heard of the inspire device but wasmn't aware of the implant nature- it's a hypoglossal CN stimulator. It still opens the airway but does not assist the air flow to the ribcage.    He reports insomnia. He has tried [place] -its giving him a metal taste and stopped working. Has had ambien but not ER form .never tried trazodone.  HPI: I have the pleasure of seeing Bryan [name] today on 10/27/2016, he reports he has struggled with CPAP use in spite of trying Klonopin, Lunesta and even Ambien to help him overcome the insomnia that he developed. His first week compliant with CPAP was rather easy but something changed and he would lay awake for an hour before initiating sleep. The CPAP setting of 11 cm water with 3 cm EPR his residual AHI is excellent at only 2.6 apneas per hour but he only used the machine for 4 out of the last 30 days because of the bothersome problem of sleep initiation. He feels tired and fatigued during the day but he has also name several stressors that have culminated over the last 6 month and may be the cause of his insomnia. I would like for him to try  an SSRi, but he just started on Zoloft. He wakes with a puffy uvula and parched mouth.  He may be a good candidate for a dental device/    [name] returns today, after he  had spent several vacations also in [place], [name]  He went to [place] in January when he needed a wheelchair to get around !  Bryan [name] presents today for his first revisit after a sleep study from 01/10/2016.His sleep study showed moderate sleep apnea with an AHI of 21.6 per hour, REM sleep had not been recorded but his supine positional sleep accentuated the apneas too 49.6 per hour. Avoiding the supine position was one of the recommendations along side with CPAP use. He returned for CPAP titration on 02/05/2016 and was titrated to 11 cm water pressure with a reduction of the AHI to 0.0 and was significant REM sleep rebound. He had some periodic limb movements but few arousals at 1.2 per hour, and his heart rate stayed in normal sinus rhythm. Sleep efficiency was 92% he did not have oxygen desaturations of significance.  In the meantime he has been using CPAP at 11 cm water pressure but his compliance has been poor he has only use it for 47% of the time for over 4 hours with an average user time of 3 hours and 1 minute. His AHI is significantly reduced when he uses the machine at 1.3 per hour. Over the last week he has made a concerted effort to use CPAP at 5 hours and 15 minutes daily,  He has undergone hip and nerve-root injections, with severe pain being only gradually relieved he sleeps often in a recliner. Since early April he had finally enough pain control and is less insomnic. He explained that he does only take tylenol 3 when needed, less than twice a week.  His left leg still jerks a lot when in bed or reclined. He has noted a right hand tremor.     [name] is a 69 y.o. male , seen here as a referral from Dr. Melford Aase for snoring, witnessed apnea and needs an evaluation. Last week  he was in [place] , [place], and didn't snore. His wife was amazed. He was skiing every day and slept well. Over the last couple of years his snoring has become louder, to a level where his wife often has left the marital bedroom. His past medical history and medical history is positive for coronary artery disease in the native coronary artery, an 80% stenosis of the distal LAD, 99% stenosis proximal and. A stent was placed. He also has diverticulosis, gastroesophageal reflux  disease, hernia, he had supraventricular tachycardia and was treated with an ablation in 2004, hyperlipidemia, hypertension, symptomatic palpitations, PVCs, vitamin D deficiency. I reviewed his medication; he has silenor/ doxepin  available at bedtime as a sleep aid, takes Zoloft, Crestor, Zofran in case of nausea, lisinopril, gabapentin 3 times a day Zetia by systolic, Plavix, Flexeril 10 mg for muscle spasms as needed. He has noted a very dry mouth and his dentist noted a dry mouth as well, likely related to doxepin. lunesta failed- metal taste. Best on klonopin. Sleep habits are as follows: The patient is usually in bed by 10 PM but struggles to fall asleep without medication. He is taking doxepin. He sometimes has myoclonic tics at night. He averages 4-6 hours of nocturnal sleep, the bedroom is cool, quiet and dark. He prefers to sleep on his side. 2 pillows for head support usually. He will have one nocturia, during the night , sometimes has trouble to go back to sleep. The bathroom break is usually between 2 and 3 AM . He has also 3-4 times a week dreams that he can recall, and sometimes tries to enact. These wake him up- arm and leg movements.  His dreams are not nightmarish in character and he usually does not feel that he has to defend himself or is under threat  He wakes up with a very dry mouth. No headaches, rarely palpitations. Sleep medical history and family sleep history:  No tonsillectomy, TBI, and no sinus ENT surgery.      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   Never smoked, caffeine , 2 cups of coffee in AM, sometimes Soda, rare tea.  Drinks 2-4 glasses of wine .   Review of Systems: Out of a complete 14 system review, the patient complains of only the following symptoms, and all other reviewed systems are negative.  tinnitus,  Diplopia.  Epworth score 8 was 3/ 24 on CPAP , Fatigue severity score 39 up to 53 now   , depression score 2/15 last visit .  Ambien, [place], Lunesta.      Social History   Socioeconomic History  . Marital status: Married    Spouse name: Not on file  . Number of children: 3  . Years of education: Not on file  . Highest education level: Not on file  Occupational History  . Not on file  Tobacco Use  . Smoking status: Never Smoker  . Smokeless tobacco: Never Used  Substance and Sexual Activity  . Alcohol use: Yes    Comment: rarely  . Drug use: No  . Sexual activity: Not on file  Other Topics Concern  . Not on file  Social History Narrative   Drinks 2 cups of coffee a day    Social Determinants of Health   Financial Resource Strain:   . Difficulty of Paying Living Expenses:   Food Insecurity:   . Worried About Charity fundraiser in the Last Year:   . Arboriculturist in the Last Year:   Transportation Needs:   . Film/video editor (Medical):   Marland Kitchen Lack of Transportation (Non-Medical):   Physical Activity:   . Days of Exercise per Week:   . Minutes of Exercise per Session:   Stress:   . Feeling of Stress :   Social Connections:   . Frequency of Communication with Friends and Family:   . Frequency of Social Gatherings with Friends and Family:   . Attends Religious Services:   .  Active Member of Clubs or Organizations:   . Attends Archivist Meetings:   Marland Kitchen Marital Status:   Intimate Partner Violence:   . Fear of Current or Ex-Partner:   . Emotionally Abused:   Marland Kitchen Physically Abused:   . Sexually Abused:      Family History  Problem Relation Age of Onset  . [name] cancer Father   . Hypothyroidism Mother   . Irritable bowel syndrome Mother   . Colonic polyp Mother        PRE-malignant polyp of rectosigmoid colon  . Colon polyps Mother   . Esophageal cancer Neg Hx   . Stomach cancer Neg Hx   . Pancreatic cancer Neg Hx   . Liver disease Neg Hx   . Colon cancer Neg Hx   . Rectal cancer Neg Hx     Past Medical History:  Diagnosis Date  . Allergy   . CAD (coronary artery disease), native coronary artery    Cath 04/19/2001  normal Left main, moderate proximal disease, 80% stenosis distal LAD, 99% stenosis proximal OM 1, 60% stenosis mid RCA, 70% stenosis ostial mid PDA  PCI with bare metal stent of OM1 same date 3.5 x 18 mm Multilink    . Diverticulosis   . GERD (gastroesophageal reflux disease)   . Hernia   . History of PSVT    Ablation done in 2004 at [facility]   . Hyperlipidemia   . Hypertension   . Sleep apnea   . Symptomatic PVCs   . Vitamin D deficiency     Past Surgical History:  Procedure Laterality Date  . ACHILLES TENDON REPAIR     left  . APPENDECTOMY    . CARDIAC ELECTROPHYSIOLOGY MAPPING AND ABLATION    . COLONOSCOPY    . CORONARY ANGIOPLASTY WITH STENT PLACEMENT     years ago    Current Outpatient Medications  Medication Sig Dispense Refill  . albuterol (PROAIR HFA) 108 (90 Base) MCG/ACT inhaler Use 2 inhalations 15 minutes apart every 4 hours if need to rescue Asthma 48 g 3  . buPROPion (WELLBUTRIN XL) 150 MG 24 hr tablet Take 1 tablet every Morning for Mood, Focus & Concentration 90 tablet 3  . clopidogrel (PLAVIX) 75 MG tablet Take 1 tablet (75 mg total) by mouth daily. 90 tablet 3  . ezetimibe (ZETIA) 10 MG tablet Take 1 tablet Daily for Cholesterol 90 tablet 3  . nebivolol (BYSTOLIC) 10 MG tablet Take 1 tablet Daily for BP & Heart 90 tablet 3  . rosuvastatin (CRESTOR) 10 MG tablet Take 1 tablet Daily for Cholesterol 90 tablet 3  . telmisartan  (MICARDIS) 80 MG tablet Take 1 tablet Daily for BP 90 tablet 3  . topiramate (TOPAMAX) 50 MG tablet Take 1/2 to 1 tablet 2 x /day at Suppertime & Bedtime for Dieting & Weight Loss 180 tablet 1  . valACYclovir (VALTREX) 500 MG tablet Take 1 tablet 2 x /day for Fever Blisters / Cold Sores 180 tablet 1  . Eszopiclone 3 MG TABS Take 1/2-1 tablet at Bedtime ONLY if needed for Sleep &  limit to 5 days /wk to avoid Addiction & Dementia (6 week/42 day supply til Apr 29) (Patient not taking: Reported on 03/31/2019) 30 tablet 0  . tadalafil (CIALIS) 20 MG tablet Take 1/2 to 1 tablet every 2 to 3 days as needed for XXXX 30 tablet 11   No current facility-administered medications for this visit.    Allergies as of 03/31/2019 -  Review Complete 03/31/2019  Allergen Reaction Noted  . Lisinopril Cough 06/05/2016  . Amoxicillin Hives and Swelling 11/16/2012  . Penicillins Swelling 12/10/2011    Vitals: BP 112/72   Pulse 94   Temp (!) 97 F (36.1 C)   Ht 5\' 10"  (1.778 m)   Wt 215 lb (97.5 kg)   BMI 30.85 kg/m  Last Weight:  Wt Readings from Last 1 Encounters:  03/31/19 215 lb (97.5 kg)   [id number] mass index is 30.85 kg/m.     Last Height:   Ht Readings from Last 1 Encounters:  03/31/19 5\' 10"  (1.778 m)    Physical exam:  General: The patient is awake, alert and appears not in acute distress. The patient is well groomed. Head: Normocephalic, atraumatic. Neck is supple. Mallampati 3.  neck circumference: 17. Nasal airflow congested and present with a dry mouth. All natural teeth.  Cardiovascular:  Regular rate and rhythm, without  murmurs or carotid bruit, and without distended neck veins. Respiratory: Lungs are clear to auscultation. Skin:  Facial puffiness,  Trunk: BMI is 30-, has less arthralgic gait.  Neurologic exam :The patient is awake and alert, oriented to place and time.    Cranial nerves: Pupils are equal and briskly reactive to light. No evidence of cataract, but blured vision at  night is still present.  he reports  Resolution of diplopia.  Extraocular movements  in vertical and horizontal planes  without nystagmus. Visual fields by finger perimetry are intact. Hearing to finger rub intact. Facial sensation intact to fine touch.  Facial motor strength is symmetric and tongue and uvula move midline. Shoulder shrug was symmetrical.  Motor exam:  No restless legs, no Spasticity.  Sensory:  Numbness in right hand, not carpaltunnel, but cervical radiculopathy.   Deep tendon reflexes: in the upper and lower extremities are symmetric and intact. Babinski maneuver deferred. .     Assessment:  After physical and neurologic examination, review of laboratory studies,  Personal review of imaging studies, reports of other /same  Imaging studies,   Results of polysomnography/ neurophysiology testing and pre-existing records as far as provided in visit., my  38 minute assessment is :  1)  His sleep study confirmed OSA at AHI 21 and CPAP titration to 11 cm relieved this. He did not enjoy CPAP and stopped using it, switched to a dental device which stooped his snoring- it still allows for apneas in supine. ' There is no REM sleep apnea breakdown on this HST, I like to repeat to confirm if REM dependent. There is for sure positional dependence. And he is advices to stay off his back-   However in today's meeting be established that the patient's insomnia which has been chronic at this point is interfering with his ability to tolerate CPAP. Since his sleep study did not show significant hypoxemia and his AHI was largely supine position dependent I suggested to approaches #1 avoiding the supine sleep position by using a tennis ball. #  2) Insomnia - chronic , failed Ambien, Klonopin, Lunesta. failed doxepin, -   I recommend cognitive behavior therapy, possibly some medication. There is several smart phone applications that would play meditative music or give breathing direction.  I will  prescribe trazodone, of this fails ambien ER.     His comorbidities, including hypertension, coronary artery disease, hyperlipidemia, and chronic pain - all affect sleep as well as the medications.     Plan:  Treatment plan and additional workup : HST ordered,.RV in  1 month ,  [name], M.D.   CC: [name] Pinto, [place] [address] [place],  [place] [address]

## 2019-03-31 NOTE — Patient Instructions (Signed)

## 2019-04-20 ENCOUNTER — Other Ambulatory Visit: Payer: Self-pay

## 2019-04-20 ENCOUNTER — Ambulatory Visit (INDEPENDENT_AMBULATORY_CARE_PROVIDER_SITE_OTHER): Payer: Medicare Other | Admitting: Neurology

## 2019-04-20 DIAGNOSIS — G4733 Obstructive sleep apnea (adult) (pediatric): Secondary | ICD-10-CM | POA: Diagnosis not present

## 2019-04-20 DIAGNOSIS — G4734 Idiopathic sleep related nonobstructive alveolar hypoventilation: Secondary | ICD-10-CM

## 2019-04-20 DIAGNOSIS — Z789 Other specified health status: Secondary | ICD-10-CM

## 2019-04-20 DIAGNOSIS — F5104 Psychophysiologic insomnia: Secondary | ICD-10-CM

## 2019-04-20 DIAGNOSIS — K009 Disorder of tooth development, unspecified: Secondary | ICD-10-CM

## 2019-05-01 DIAGNOSIS — K009 Disorder of tooth development, unspecified: Secondary | ICD-10-CM | POA: Insufficient documentation

## 2019-05-01 DIAGNOSIS — G4733 Obstructive sleep apnea (adult) (pediatric): Secondary | ICD-10-CM | POA: Insufficient documentation

## 2019-05-01 NOTE — Addendum Note (Signed)
Addended by: [name] on: 05/01/2019 01:30 PM   Modules accepted: Orders

## 2019-05-01 NOTE — Procedures (Signed)
Patient Information     First Name: Bryan Last Name: [name] ID: [id number]  Birth Date: 18-Mar-1950 Age: 69 Gender: Male  Referring Provider: [name] Pinto, MD BMI: 30.9 (W=216 lb, H=5' 10'')  Neck Circ.:  17 '' Epworth:  8/24   Sleep Study Information    Study Date: Apr 20, 2019 S/H/A Version: 001.001.001.001 / 4.1.1528 / 77  History:    03-31-2019 Rv for Bryan [name], 69 years of age, and a patient with known OSA and failed PAP therapy- he has been vaccinated for Covid, both shots. Dr Ron Parker made a dental device that has only been partially effective as there is REM dependent apnea. We meet to re- cap and look for alternatives.  Bryan [name] has had trouble with his front teeth, especially his left canine, and he relates the breakage of the tooth to the difficulties and resistance of the dental device he wore.  So he is reluctant to get back on it but also because it was not treating his apnea effectively.  Dr. Ron Parker' HST had shown that there was still a high residual AHI.  Dr. Ron Parker allowed me to review his diagnostic report this was a ResMed device, his so home sleep test was done by [facility].  Supine AHI 30.8/h of which apneas were 17.9/h, he slept 1 hour 10 minutes in supine so this was a big part of the sleep study.  Non-supine sleep AHI 1.92 apneas 0.3/h.   Bryan [name] also endorsed the fatigue severity scale today at 45 out of 63 points which is high, his sleep has not been refreshing or restorative and is highly fragmented.  He endorsed the Epworth Sleepiness Scale at 8 points out of 24.   I would like to recap his sleep study from 10 January 2016 at the time he only slept 68% of the recorded time in the lab his AHI was 21.6 and he did not reach REM sleep.  The problem is that we could not evaluate REM sleep dependent apnea in a patient who never entered REM sleep in the first place.   He returned for full night titration on 30 January the same year his sleep efficiency the first night was 91.6% his AHI  reduced to 0.9 this time REM sleep was entered and during REM sleep he still has a 3.8 apneas per hour.  He often lost the CPAP interface in the night- the snoring was gone while he used the CPAP and the dental device.  Still had REM dependent apneas- he has heard of the inspire device but wasn't aware of the implant nature.   Summary & Diagnosis:    HST watch pat confirms an AHI of 30.9/h and much higher REM sleep AHI of 52.3/h. 95.8 minutes of total oxygen desaturation time and frequent desaturation events (ODI was 26.7 and in REM sleep rose to 44/h). there was a total sleep time exceeding 9 hours recorded. Tachy-bradycardia was also noted.  No positional data were available.    Recommendations:     Based on REM sleep dependent apnea data, this patient has only positive Airway therapy as an option. Neither a dental device nor the Inspire device will be able to correct the hypoxemia in REM sleep.    Interpreting Physician: [name], MD              Start Study Time: End Study Time: Total Recording Time:  10:57:25 PM 10:33:59 AM 11 h, 36 min  Total Sleep Time %  REM of Sleep Time:  9 h, 26 min  22.2    Mean: 91 Minimum: 56 Maximum: 99  Mean of Desaturations Nadirs (%):   83  Oxygen Desaturation. %:   4-9 10-20 >20 Total  Events Number Total   101  99 41.7 40.9  42 17.4  242 100.0  Oxygen Saturation: <90 <=88 <85 <80 <70  Duration (minutes): Sleep % 95.8 16.9 82.6 52.6 14.6 9.3 27.8 4.9 5.4 1.0     Respiratory Indices       Total Events REM NREM All Night  pRDI: pAHI: ODI:  294  273  242 55.8 52.3 44.7 26.9 24.9 22.5 32.4 30.1 26.7       Pulse Rate Statistics during Sleep (BPM)      Mean: 80 Minimum: 36 Maximum: 117    Sleep Summary  Oxygen Saturation Statistics   Indices are calculated using technically valid sleep time of 9 h, 4 min. pRDI/pAHI are calculated using 02  desaturations ? 3%            Sleep Stages Chart                                               pAHI=30.1                                                          Mild              Moderate                    Severe                                                 5              15                    30  * Reference values are according to AASM guidelines

## 2019-05-01 NOTE — Progress Notes (Signed)
Summary & Diagnosis:   HST watch pat confirms an AHI of 30.9/h and much higher REM sleep  AHI of 52.3/h. 95.8 minutes of total oxygen desaturation time and  frequent desaturation events (ODI was 26.7 and in REM sleep rose  to 44/h). There was a total sleep time exceeding 9 hours  Recorded (!). Tachy-bradycardia was also noted. No positional data  were available.    Recommendations:    Based on REM sleep dependent apnea data, this patient has only  positive Airway therapy as an option. Neither a dental device nor  the Inspire device will be able to correct the hypoxemia in REM  sleep.    PS : I will ask Bryan [name] to return for a titration with an interface fit, starting at 7 cm water and , if necessary, switching to BiPAP for comfort.   Interpreting Physician: [name], MD

## 2019-05-02 ENCOUNTER — Telehealth: Payer: Self-pay | Admitting: Neurology

## 2019-05-02 NOTE — Telephone Encounter (Signed)
-----   Message from [name], MD sent at 05/01/2019  1:30 PM EDT ----- Summary & Diagnosis:   HST watch pat confirms an AHI of 30.9/h and much higher REM sleep  AHI of 52.3/h. 95.8 minutes of total oxygen desaturation time and  frequent desaturation events (ODI was 26.7 and in REM sleep rose  to 44/h). There was a total sleep time exceeding 9 hours  Recorded (!). Tachy-bradycardia was also noted. No positional data  were available.    Recommendations:    Based on REM sleep dependent apnea data, this patient has only  positive Airway therapy as an option. Neither a dental device nor  the Inspire device will be able to correct the hypoxemia in REM  sleep.    PS : I will ask Mr. Myrtis Ser to return for a titration with an interface fit, starting at 7 cm water and , if necessary, switching to BiPAP for comfort.   Interpreting Physician: [name], MD

## 2019-05-02 NOTE — Telephone Encounter (Signed)
Called the patient to review the SS. There was no answer LVM advising the patient that had the results to review with him. Patient is scheduled for 1 mo follow up for tomorrow. Based off the study she is recommending further testing and with that said I can either review the study with him over the phone and push that apt out or the patient can keep the apt and discuss further with Dr Dohmeier tomorrow. Dr Brett Fairy said we could leave up to the patient what he wants to do. Advised the pt to call back.

## 2019-05-03 ENCOUNTER — Encounter: Payer: Self-pay | Admitting: Neurology

## 2019-05-03 ENCOUNTER — Ambulatory Visit (INDEPENDENT_AMBULATORY_CARE_PROVIDER_SITE_OTHER): Payer: Medicare Other | Admitting: Neurology

## 2019-05-03 ENCOUNTER — Other Ambulatory Visit: Payer: Self-pay

## 2019-05-03 ENCOUNTER — Telehealth: Payer: Self-pay | Admitting: Neurology

## 2019-05-03 VITALS — BP 118/82 | HR 72 | Temp 97.6°F | Ht 70.0 in | Wt 213.0 lb

## 2019-05-03 DIAGNOSIS — Z9989 Dependence on other enabling machines and devices: Secondary | ICD-10-CM | POA: Diagnosis not present

## 2019-05-03 DIAGNOSIS — I700 Atherosclerosis of aorta: Secondary | ICD-10-CM

## 2019-05-03 DIAGNOSIS — I708 Atherosclerosis of other arteries: Secondary | ICD-10-CM

## 2019-05-03 DIAGNOSIS — G4733 Obstructive sleep apnea (adult) (pediatric): Secondary | ICD-10-CM | POA: Diagnosis not present

## 2019-05-03 DIAGNOSIS — F5104 Psychophysiologic insomnia: Secondary | ICD-10-CM

## 2019-05-03 MED ORDER — ZOLPIDEM TARTRATE ER 6.25 MG PO TBCR
6.2500 mg | EXTENDED_RELEASE_TABLET | Freq: Every evening | ORAL | 1 refills | Status: DC | PRN
Start: 2019-05-03 — End: 2019-06-01

## 2019-05-03 NOTE — Patient Instructions (Signed)
Zolpidem extended-release tablets What is this medicine? ZOLPIDEM (zole PI dem) is used to treat insomnia. This medicine helps you to fall asleep and sleep through the night. This medicine may be used for other purposes; ask your health care provider or pharmacist if you have questions. COMMON BRAND NAME(S): Ambien CR What should I tell my health care provider before I take this medicine? They need to know if you have any of these conditions:  depression  history of drug abuse or addiction  if you often drink alcohol  liver disease  lung or breathing disease  myasthenia gravis  sleep apnea  sleep-walking, driving, eating or other activity while not fully awake after taking a sleep medicine  suicidal thoughts, plans, or attempt; a previous suicide attempt by you or a family member  an unusual or allergic reaction to zolpidem, other medicines, foods, dyes, or preservatives  pregnant or trying to get pregnant  breast-feeding How should I use this medicine? Take this medicine by mouth with a glass of water. Follow the directions on the prescription label. Do not crush, split, or chew the tablet before swallowing. It is better to take this medicine on an empty stomach and only when you are ready for bed. Do not take your medicine more often than directed. If you have been taking this medicine for several weeks and suddenly stop taking it, you may get unpleasant withdrawal symptoms. Your doctor or health care professional may want to gradually reduce the dose. Do not stop taking this medicine on your own. Always follow your doctor or health care professional's advice. A special MedGuide will be given to you by the pharmacist with each prescription and refill. Be sure to read this information carefully each time. Talk to your pediatrician regarding the use of this medicine in children. Special care may be needed. Overdosage: If you think you have taken too much of this medicine contact a  poison control center or emergency room at once. NOTE: This medicine is only for you. Do not share this medicine with others. What if I miss a dose? This does not apply. This medicine should only be taken immediately before going to sleep. Do not take double or extra doses. What may interact with this medicine?  alcohol  antihistamines for allergy, cough and cold  certain medicines for anxiety or sleep  certain medicines for depression, like amitriptyline, fluoxetine, sertraline  certain medicines for fungal infections like ketoconazole and itraconazole  certain medicines for seizures like phenobarbital, primidone  ciprofloxacin  dietary supplements for sleep, like valerian or kava kava  general anesthetics like halothane, isoflurane, methoxyflurane, propofol  local anesthetics like lidocaine, pramoxine, tetracaine  medicines that relax muscles for surgery  narcotic medicines for pain  phenothiazines like chlorpromazine, mesoridazine, prochlorperazine, thioridazine  rifampin This list may not describe all possible interactions. Give your health care provider a list of all the medicines, herbs, non-prescription drugs, or dietary supplements you use. Also tell them if you smoke, drink alcohol, or use illegal drugs. Some items may interact with your medicine. What should I watch for while using this medicine? Visit your doctor or health care professional for regular checks on your progress. Keep a regular sleep schedule by going to bed at about the same time each night. Avoid caffeine-containing drinks in the evening hours. When sleep medicines are used every night for more than a few weeks, they may stop working. Talk to your doctor if your insomnia worsens or is not better within 7 to  10 days. After taking this medicine, you may get up out of bed and do an activity that you do not know you are doing. The next morning, you may have no memory of this. Activities include driving a car  ("sleep-driving"), making and eating food, talking on the phone, sexual activity, and sleep-walking. Serious injuries have occurred. Stop the medicine and call your doctor right away if you find out you have done any of these activities. Do not take this medicine if you have used alcohol that evening. Do not take it if you have taken another medicine for sleep. The risk of doing these sleep-related activities is higher. Do not take this medicine unless you are able to stay in bed for a full night (7 to 8 hours) before you must be active again. Do not drive, use machinery, or do anything that needs mental alertness the day after you take this medicine. You may have a decrease in mental alertness the day after use, even if you feel that you are fully awake. Tell your doctor if you will need to perform activities requiring full alertness, such as driving, the next day. Do not stand or sit up quickly, especially if you are an older patient. This reduces the risk of dizzy or fainting spells. If you or your family notice any changes in your moods or behavior, such as new or worsening depression, thoughts of harming yourself, anxiety, other unusual or disturbing thoughts, or memory loss, call your doctor right away. After you stop taking this medicine, you may have trouble falling asleep. This is called rebound insomnia. This problem usually goes away on its own after 1 or 2 nights. What side effects may I notice from receiving this medicine? Side effects that you should report to your doctor or health care professional as soon as possible:  allergic reactions like skin rash, itching or hives, swelling of the face, lips, or tongue  breathing problems  changes in vision  confusion  depressed mood or other changes in moods or emotions  feeling faint or lightheaded, falls  hallucinations  loss of balance or coordination  loss of memory  numbness or tingling of the tongue  restlessness, excitability,  or feelings of anxiety or agitation  signs and symptoms of liver injury like dark yellow or brown urine; general ill feeling or flu-like symptoms; light-colored stools; loss of appetite; nausea; right upper belly pain; unusually weak or tired; yellowing of the eyes or skin  suicidal thoughts  unusual activities while not fully awake like driving, eating, making phone calls, or sexual activity Side effects that usually do not require medical attention (report to your doctor or health care professional if they continue or are bothersome):  dizziness  drowsiness the day after you take this medicine  headache This list may not describe all possible side effects. Call your doctor for medical advice about side effects. You may report side effects to FDA at 1-800-FDA-1088. Where should I keep my medicine? Keep out of the reach of children. This medicine can be abused. Keep your medicine in a safe place to protect it from theft. Do not share this medicine with anyone. Selling or giving away this medicine is dangerous and against the law. This medicine may cause accidental overdose and death if taken by other adults, children, or pets. Mix any unused medicine with a substance like cat litter or coffee grounds. Then throw the medicine away in a sealed container like a sealed bag or a coffee can  with a lid. Do not use the medicine after the expiration date. Store at controlled room temperature between 15 and 25 degrees C (59 and 77 degrees F). NOTE: This sheet is a summary. It may not cover all possible information. If you have questions about this medicine, talk to your doctor, pharmacist, or health care provider.  2020 [employer] (2017-06-19 12:52:00)

## 2019-05-03 NOTE — Telephone Encounter (Signed)
Pt presented today for office visit and I was able to review the sleep study with him. I advised pt that Dr. Brett Fairy reviewed their sleep study results and found that severe sleep apnea and recommends that pt be treated with a cpap. Dr. Brett Fairy recommends that pt return for a repeat sleep study in order to properly titrate the cpap and ensure a good mask fit. Pt is agreeable to returning for a titration study. I advised pt that our sleep lab will file with pt's insurance and call pt to schedule the sleep study when we hear back from the pt's insurance regarding coverage of this sleep study. Pt verbalized understanding of results and had no questions.  Advised the patient that if for some reason insurance does not cover for [place] to bring him in then we will call him with our plan b.

## 2019-05-03 NOTE — Telephone Encounter (Signed)
-----   Message from [name], MD sent at 05/01/2019  1:30 PM EDT ----- Summary & Diagnosis:   HST watch pat confirms an AHI of 30.9/h and much higher REM sleep  AHI of 52.3/h. 95.8 minutes of total oxygen desaturation time and  frequent desaturation events (ODI was 26.7 and in REM sleep rose  to 44/h). There was a total sleep time exceeding 9 hours  Recorded (!). Tachy-bradycardia was also noted. No positional data  were available.    Recommendations:    Based on REM sleep dependent apnea data, this patient has only  positive Airway therapy as an option. Neither a dental device nor  the Inspire device will be able to correct the hypoxemia in REM  sleep.    PS : I will ask Mr. Myrtis Ser to return for a titration with an interface fit, starting at 7 cm water and , if necessary, switching to BiPAP for comfort.   Interpreting Physician: [name], MD

## 2019-05-03 NOTE — Telephone Encounter (Signed)
PA completed through cover my meds/ [facility] [employer] for Ambien CR, [id number] Pt has tried and failed lunesta, ambien, clonazepam, xanax, doxepin. Medication was approved 02/02/2019 - 05/02/2020

## 2019-05-03 NOTE — Progress Notes (Signed)
S[facility]EP [facility]   Provider:  Larey [name], M D  Referring Provider: [name] Pinto, MD Primary Care Physician:  Bryan Pinto, MD  Chief Complaint  Patient presents with  . Follow-up    pt alone, rm 10. pt presents today as a 1 month follow up visit. HST was completed. i reviewed the results with him while rooming. he does mention that the trazodone that was prescribed is not helping.    RV on 05-03-2019 CD. Bryan [name] [name] is returning after PSG test and reports ongoing dependency on Ambien- cannot sleep with trazodone. He had undergone a home sleep test on April 14 and today the home sleep test confirmed an AHI of 30.9 which is considered severe apnea, much higher REM sleep dependent AHI of 52.3 and a complete 9-hour sleep time.  Very concerning however 95.8 minutes out of his total sleep time there in oxygen desaturation.  Positional data did not register.  Based on these home sleep test data I think that a dental device is not an appropriate treatment.  There is such as strict dependency on REM sleep with oxygen desaturation and variable heart rates but only a CPAP device is actually an appropriate treatment.  Even the inspire device is not as likely to help him overcome REM dependent sleep apnea.  His continues to endorse the Epworth Sleepiness Scale at 16 out of 24 points, there was no fatigue endorsed.  He continues to have trouble sleeping at night and reported that he slept rather well when using Ambien.  I would consider him by now dependent on sleep aids and I will continue to provide the medication that he needs to get sleep also we will probably need to alternate 4 days of Ambien with 3 days of trazodone or something else to help him slowly wean off. Offered belsomra. He uses melatonin. He has tried oils, soft meditative music, all helps to relax, none helps to sleep through.   RV on 03-31-2019, Bryan [name] [name], 69 years of age, and a patient with OSA and failed PAP therapy- he has  already been vaccinated for Covid, both shots, Moderna. Bryan [name] [name] made a dental device that has only been partially effective as there is REM dependent apnea. We meet to re cap and look for alternatives. Bryan [name] [name] who prefers to go by brain has had trouble with his front teeth especially his left canine, and he relates the breakage of the tooth to the difficulties and resistance of the dental device he wore.  So he is reluctant to get back on it but also because it can cause jaw pain as well as not treating his apnea effectively.  Bryan. Ron [name]' had shown that there was still a high residual AHI. Bryan. Ron [name] allowed me to review his diagnostic report this was a ResMed device, his so home sleep test was done by apnea link.  Supine AHI 30.8 of which apneas were 17.9, he slept 1 hour 10 minutes in supine so this was a big part of the sleep study.  Nonsupine sleep AHI 1.92 apneas 0.3/h.  Total events all obstructive no Cheyne-Stokes respiration no prolonged oxygen desaturation heart rate variability was great between 45 bpm and 148 bpm but this is notoriously artifact prone in any home sleep test.  Bryan [name]. Bryan [name] [name] also endorsed the fatigue severity scale today at 53 out of 63 points which is high, his sleep has not been refreshing or restorative and is highly fragmented.  He endorsed the Epworth Sleepiness Scale at  8 points out of 24.  I would like to recap his sleep study from 10 January 2016 at the time he only slept 68% of the recorded time in the lab his AHI was 21.6 and he did not reach REM sleep.  The problem is that we could not evaluate REM sleep dependent apnea in a patient who never entered REM sleep in the first place.  He returned for full night titration on 30 January the same year his sleep efficiency the first night was 91.6% his AHI reduced to 0.9 this time REM sleep was entered and during REM sleep he still has a 3.8 apneas per hour  He often lost the CPAP interface in the night- the snoring was gone while he  used the CPAP and the dental device.  Still had REM dependent apneas- he has heard of the inspire device but wasmn't aware of the implant nature- it's a hypoglossal CN stimulator. It still opens the airway but does not assist the air flow to the ribcage.    He reports insomnia. He has tried [place] -its giving him a metal taste and stopped working. Has had ambien but not ER form .never tried trazodone.      HPI: I have the pleasure of seeing Bryan [name] [name] today on 69/22/2018, he reports he has struggled with CPAP use in spite of trying Klonopin, Lunesta and even Ambien to help him overcome the insomnia that he developed. His first week compliant with CPAP was rather easy but something changed and he would lay awake for an hour before initiating sleep. The CPAP setting of 11 cm water with 3 cm EPR his residual AHI is excellent at only 2.6 apneas per hour but he only used the machine for 4 out of the last 30 days because of the bothersome problem of sleep initiation. He feels tired and fatigued during the day but he has also name several stressors that have culminated over the last 6 month and may be the cause of his insomnia. I would like for him to try an SSRi, but he just started on Zoloft. He wakes with a puffy uvula and parched mouth.  He may be a good candidate for a dental device/    Bryan [name] [name] returns today, after he  had spent several vacations also in [place], [name]  He went to [place] in January when he needed a wheelchair to get around !  Bryan [name] [name] presents today for his first revisit after a sleep study from 01/10/2016.His sleep study showed moderate sleep apnea with an AHI of 21.6 per hour, REM sleep had not been recorded but his supine positional sleep accentuated the apneas too 49.6 per hour. Avoiding the supine position was one of the recommendations along side with CPAP use. He returned for CPAP titration on 02/05/2016 and was titrated to 11 cm water pressure  with a reduction of the AHI to 0.0 and was significant REM sleep rebound. He had some periodic limb movements but few arousals at 1.2 per hour, and his heart rate stayed in normal sinus rhythm. Sleep efficiency was 92% he did not have oxygen desaturations of significance.  In the meantime he has been using CPAP at 11 cm water pressure but his compliance has been poor he has only use it for 47% of the time for over 4 hours with an average user time of 3 hours and 1 minute. His AHI is significantly reduced when he uses the machine at 1.3  per hour. Over the last week he has made a concerted effort to use CPAP at 5 hours and 15 minutes daily,  He has undergone hip and nerve-root injections, with severe pain being only gradually relieved he sleeps often in a recliner. Since early April he had finally enough pain control and is less insomnic. He explained that he does only take tylenol 3 when needed, less than twice a week.  His left leg still jerks a lot when in bed or reclined. He has noted a right hand tremor.     [name] [name] is a 69 y.o. male , seen here as a referral from Bryan. Melford Aase for snoring, witnessed apnea and needs an evaluation. Last week he was in [place] , [place], and didn't snore. His wife was amazed. He was skiing every day and slept well. Over the last couple of years his snoring has become louder, to a level where his wife often has left the marital bedroom. His past medical history and medical history is positive for coronary artery disease in the native coronary artery, an 80% stenosis of the distal LAD, 99% stenosis proximal and. A stent was placed. He also has diverticulosis, gastroesophageal reflux disease, hernia, he had supraventricular tachycardia and was treated with an ablation in 2004, hyperlipidemia, hypertension, symptomatic palpitations, PVCs, vitamin D deficiency. I reviewed his medication; he has silenor/ doxepin  available at bedtime as a sleep aid, takes Zoloft,  Crestor, Zofran in case of nausea, lisinopril, gabapentin 3 times a day Zetia by systolic, Plavix, Flexeril 10 mg for muscle spasms as needed. He has noted a very dry mouth and his dentist noted a dry mouth as well, likely related to doxepin. lunesta failed- metal taste. Best on klonopin. Sleep habits are as follows: The patient is usually in bed by 10 PM but struggles to fall asleep without medication. He is taking doxepin. He sometimes has myoclonic tics at night. He averages 4-6 hours of nocturnal sleep, the bedroom is cool, quiet and dark. He prefers to sleep on his side. 2 pillows for head support usually. He will have one nocturia, during the night , sometimes has trouble to go back to sleep. The bathroom break is usually between 2 and 3 AM . He has also 3-4 times a week dreams that he can recall, and sometimes tries to enact. These wake him up- arm and leg movements.  His dreams are not nightmarish in character and he usually does not feel that he has to defend himself or is under threat  He wakes up with a very dry mouth. No headaches, rarely palpitations. Sleep medical history and family sleep history:  No tonsillectomy, TBI, and no sinus ENT surgery.    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   Never smoked, caffeine , 2 cups of coffee in AM, sometimes Soda, rare tea.  Drinks 2-4 glasses of wine .   Review of Systems: Out of a complete 14 system review, the patient complains of only the following symptoms, and all other reviewed systems are negative.  tinnitus,  Diplopia.  Epworth score 8 was 3/ 24 on CPAP , Fatigue severity score 39 up to 53 now   , depression score 2/15 last visit .  Ambien, [place], Lunesta.      Social History   Socioeconomic History  . Marital status: Married    Spouse name: Not on file  . Number of children: 3  . Years of education: Not on  file  . Highest education level: Not on file  Occupational History  . Not on file  Tobacco  Use  . Smoking status: Never Smoker  . Smokeless tobacco: Never Used  Substance and Sexual Activity  . Alcohol use: Yes    Comment: rarely  . Drug use: No  . Sexual activity: Not on file  Other Topics Concern  . Not on file  Social History Narrative   Drinks 2 cups of coffee a day    Social Determinants of Health   Financial Resource Strain:   . Difficulty of Paying Living Expenses:   Food Insecurity:   . Worried About Charity fundraiser in the Last Year:   . Arboriculturist in the Last Year:   Transportation Needs:   . Film/video editor (Medical):   Marland Kitchen Lack of Transportation (Non-Medical):   Physical Activity:   . Days of Exercise per Week:   . Minutes of Exercise per Session:   Stress:   . Feeling of Stress :   Social Connections:   . Frequency of Communication with Friends and Family:   . Frequency of Social Gatherings with Friends and Family:   . Attends Religious Services:   . Active Member of Clubs or Organizations:   . Attends Archivist Meetings:   Marland Kitchen Marital Status:   Intimate Partner Violence:   . Fear of Current or Ex-Partner:   . Emotionally Abused:   Marland Kitchen Physically Abused:   . Sexually Abused:     Family History  Problem Relation Age of Onset  . Brain cancer Father   . Hypothyroidism Mother   . Irritable bowel syndrome Mother   . Colonic polyp Mother        PRE-malignant polyp of rectosigmoid colon  . Colon polyps Mother   . Esophageal cancer Neg Hx   . Stomach cancer Neg Hx   . Pancreatic cancer Neg Hx   . Liver disease Neg Hx   . Colon cancer Neg Hx   . Rectal cancer Neg Hx     Past Medical History:  Diagnosis Date  . Allergy   . CAD (coronary artery disease), native coronary artery    Cath 04/19/2001  normal Left main, moderate proximal disease, 80% stenosis distal LAD, 99% stenosis proximal OM 1, 60% stenosis mid RCA, 70% stenosis ostial mid PDA  PCI with bare metal stent of OM1 same date 3.5 x 18 mm Multilink    .  Diverticulosis   . GERD (gastroesophageal reflux disease)   . Hernia   . History of PSVT    Ablation done in 2004 at [facility]   . Hyperlipidemia   . Hypertension   . Sleep apnea   . Symptomatic PVCs   . Vitamin D deficiency     Past Surgical History:  Procedure Laterality Date  . ACHILLES TENDON REPAIR     left  . APPENDECTOMY    . CARDIAC ELECTROPHYSIOLOGY MAPPING AND ABLATION    . COLONOSCOPY    . CORONARY ANGIOPLASTY WITH STENT PLACEMENT     years ago    Current Outpatient Medications  Medication Sig Dispense Refill  . albuterol (PROAIR HFA) 108 (90 Base) MCG/ACT inhaler Use 2 inhalations 15 minutes apart every 4 hours if need to rescue Asthma 48 g 3  . buPROPion (WELLBUTRIN XL) 150 MG 24 hr tablet Take 1 tablet every Morning for Mood, Focus & Concentration 90 tablet 3  . clopidogrel (PLAVIX) 75 MG tablet Take  1 tablet (75 mg total) by mouth daily. 90 tablet 3  . ezetimibe (ZETIA) 10 MG tablet Take 1 tablet Daily for Cholesterol 90 tablet 3  . nebivolol (BYSTOLIC) 10 MG tablet Take 1 tablet Daily for BP & Heart 90 tablet 3  . rosuvastatin (CRESTOR) 10 MG tablet Take 1 tablet Daily for Cholesterol 90 tablet 3  . telmisartan (MICARDIS) 80 MG tablet Take 1 tablet Daily for BP 90 tablet 3  . traZODone (DESYREL) 50 MG tablet Take 1 tablet (50 mg total) by mouth at bedtime as needed for sleep. (Patient taking differently: Take 50-100 mg by mouth at bedtime as needed for sleep. ) 30 tablet 2  . valACYclovir (VALTREX) 500 MG tablet Take 1 tablet 2 x /day for Fever Blisters / Cold Sores 180 tablet 1  . tadalafil (CIALIS) 20 MG tablet Take 1/2 to 1 tablet every 2 to 3 days as needed for XXXX 30 tablet 11   No current facility-administered medications for this visit.    Allergies as of 05/03/2019 - Review Complete 05/03/2019  Allergen Reaction Noted  . Lisinopril Cough 06/05/2016  . Amoxicillin Hives and Swelling 11/16/2012  . Penicillins Swelling 12/10/2011     Vitals: BP 118/82   Pulse 72   Temp 97.6 F (36.4 C)   Ht 5\' 10"  (1.778 m)   Wt 213 lb (96.6 kg)   BMI 30.56 kg/m  Last Weight:  Wt Readings from Last 1 Encounters:  05/03/19 213 lb (96.6 kg)   [id number] mass index is 30.56 kg/m.     Last Height:   Ht Readings from Last 1 Encounters:  05/03/19 5\' 10"  (1.778 m)    Physical exam:  General: The patient is awake, alert and appears not in acute distress. The patient is well groomed. Head: Normocephalic, atraumatic. Neck is supple. Mallampati 3.  neck circumference: 17. Nasal airflow congested and present with a dry mouth. All natural teeth.  Cardiovascular:  Regular rate and rhythm, without  murmurs or carotid bruit, and without distended neck veins. Respiratory: Lungs are clear to auscultation. Skin:  Facial puffiness,  Trunk: BMI is 30-, has less arthralgic gait.  Neurologic exam :The patient is awake and alert, oriented to place and time.    Cranial nerves: Pupils are equal and briskly reactive to light. No evidence of cataract, but blured vision at night is still present.  he reports  Resolution of diplopia.  Extraocular movements  in vertical and horizontal planes  without nystagmus. Visual fields by finger perimetry are intact. Hearing to finger rub intact. Facial sensation intact to fine touch.  Facial motor strength is symmetric and tongue and uvula move midline. Shoulder shrug was symmetrical.  Motor exam:  No restless legs, no Spasticity.  Sensory:  Numbness in right hand, not carpaltunnel, but cervical radiculopathy.   Deep tendon reflexes: in the upper and lower extremities are symmetric and intact. Babinski maneuver deferred. .     Assessment:  After physical and neurologic examination, review of laboratory studies,  Personal review of imaging studies, reports of other /same  Imaging studies,   Results of polysomnography/ neurophysiology testing and pre-existing records as far as provided in visit., my  38 minute  assessment is :  1)  His sleep study (HST ) confirmed OSA and REM sleep dependent form- and CPAP titration to 11 cm relieved this. He needs to return to CPAP for REM dependent apnea.    However in today's meeting be established that the patient's insomnia which has  been chronic at this point is interfering with his ability to tolerate CPAP. Since his sleep study did not show significant hypoxemia and his AHI was largely supine position dependent I suggested to approaches #1 avoiding the supine sleep position by using a tennis ball. #  2) Insomnia - chronic , failed Ambien, Klonopin, Lunesta. failed doxepin, -   I recommend cognitive behavior therapy, possibly some medication. There is several smart phone applications that would play meditative music or give breathing direction.  I  prescribed trazodone, this failed and I will consider  ambien ER.     His comorbidities, including hypertension, coronary artery disease, hyperlipidemia, and chronic pain - all affect sleep as well as the medications.      Plan:  Treatment plan and additional workup :  Autotitration: CPAP 5-15 cm water 3 cm EPR and Ambien XR lowest manufactured dose.  Bryan [name] [name], M.D.   CC: Bryan [name] [name], [address] [place],  [place] [id number]

## 2019-05-09 ENCOUNTER — Other Ambulatory Visit: Payer: Self-pay

## 2019-05-09 ENCOUNTER — Ambulatory Visit (INDEPENDENT_AMBULATORY_CARE_PROVIDER_SITE_OTHER): Payer: Medicare Other | Admitting: Cardiovascular Disease

## 2019-05-09 ENCOUNTER — Encounter: Payer: Self-pay | Admitting: Cardiovascular Disease

## 2019-05-09 VITALS — BP 102/66 | HR 72 | Ht 71.0 in | Wt 215.0 lb

## 2019-05-09 DIAGNOSIS — I700 Atherosclerosis of aorta: Secondary | ICD-10-CM

## 2019-05-09 DIAGNOSIS — I10 Essential (primary) hypertension: Secondary | ICD-10-CM | POA: Diagnosis not present

## 2019-05-09 DIAGNOSIS — I708 Atherosclerosis of other arteries: Secondary | ICD-10-CM

## 2019-05-09 DIAGNOSIS — E782 Mixed hyperlipidemia: Secondary | ICD-10-CM | POA: Diagnosis not present

## 2019-05-09 DIAGNOSIS — I2510 Atherosclerotic heart disease of native coronary artery without angina pectoris: Secondary | ICD-10-CM | POA: Diagnosis not present

## 2019-05-09 MED ORDER — ROSUVASTATIN CALCIUM 20 MG PO TABS: 20.0000 mg | ORAL_TABLET | Freq: Every day | ORAL | 3 refills | Status: DC

## 2019-05-09 NOTE — Progress Notes (Signed)
Cardiology Office Note:    Date:  05/09/2019   ID:  Bryan [name], Bryan [name] Oct 29, 1950, MRN [id number]  PCP:  [name] Pinto, MD  Cardiologist:  Sherren Mocha, MD  Electrophysiologist:  None   Referring MD: [name] Pinto, MD   Chief Complaint  Patient presents with  . Coronary Artery Disease    History of Present Illness:    Bryan [name] is a 69 y.o. male with a hx of CAD, presenting for cardiology evaluation.   The patient initially presented in 2003 with ACS. He was found to have critical stenosis of the first OM branch of the circumflex and underwent PCI with a bare metal stent at that time. He was noted to have severe apical LAD stenosis and moderate 60-70% RCA stenoses associated with areas of aneurysmal dilatation. LV function was normal with LVEF 65%. Comorbid medical conditions include sleep apnea, HTN, and mixed dyslipidemia.   The patient is here alone today. He has previously been followed by Dr Wynonia Lawman. He has done very well since his episode in 2003, and denies any issues with chest pain or shortness of breath. He has recently been diagnosed with obstructive sleep apnea and he has failed a mouthpiece in the past. He is going to return for CPAP testing/titration in the near future. He used to exercise regularly at the [facility] for many years. He hasn't been engaged in exercise since the Covid-19 pandemic. He complains of generalized fatigue and relates this to sleep apnea. He feels good energy with physical activity, but complains of lethargy in the afternoons.   Past Medical History:  Diagnosis Date  . Allergy   . CAD (coronary artery disease), native coronary artery    Cath 04/19/2001  normal Left main, moderate proximal disease, 80% stenosis distal LAD, 99% stenosis proximal OM 1, 60% stenosis mid RCA, 70% stenosis ostial mid PDA  PCI with bare metal stent of OM1 same date 3.5 x 18 mm Multilink    . Diverticulosis   . GERD (gastroesophageal reflux disease)   . Hernia   .  History of PSVT    Ablation done in 2004 at [name]   . Hyperlipidemia   . Hypertension   . Sleep apnea   . Symptomatic PVCs   . Vitamin D deficiency     Past Surgical History:  Procedure Laterality Date  . ACHILLES TENDON REPAIR     left  . APPENDECTOMY    . CARDIAC ELECTROPHYSIOLOGY MAPPING AND ABLATION    . COLONOSCOPY    . CORONARY ANGIOPLASTY WITH STENT PLACEMENT     years ago    Current Medications: Current Meds  Medication Sig  . albuterol (PROAIR HFA) 108 (90 Base) MCG/ACT inhaler Use 2 inhalations 15 minutes apart every 4 hours if need to rescue Asthma  . buPROPion (WELLBUTRIN XL) 150 MG 24 hr tablet Take 1 tablet every Morning for Mood, Focus & Concentration  . clopidogrel (PLAVIX) 75 MG tablet Take 1 tablet (75 mg total) by mouth daily.  Marland Kitchen ezetimibe (ZETIA) 10 MG tablet Take 1 tablet Daily for Cholesterol  . nebivolol (BYSTOLIC) 10 MG tablet Take 1 tablet Daily for BP & Heart  . rosuvastatin (CRESTOR) 20 MG tablet Take 1 tablet (20 mg total) by mouth daily.  . tadalafil (CIALIS) 20 MG tablet Take 1/2 to 1 tablet every 2 to 3 days as needed for XXXX  . telmisartan (MICARDIS) 80 MG tablet Take 1 tablet Daily for BP  . valACYclovir (VALTREX) 500 MG tablet  Take 1 tablet 2 x /day for Fever Blisters / Cold Sores  . zolpidem (AMBIEN CR) 6.25 MG CR tablet Take 1 tablet (6.25 mg total) by mouth at bedtime as needed for sleep.  . [DISCONTINUED] rosuvastatin (CRESTOR) 10 MG tablet Take 1 tablet Daily for Cholesterol     Allergies:   Lisinopril, Amoxicillin, and Penicillins   Social History   Socioeconomic History  . Marital status: Married    Spouse name: Not on file  . Number of children: 3  . Years of education: Not on file  . Highest education level: Not on file  Occupational History  . Not on file  Tobacco Use  . Smoking status: Never Smoker  . Smokeless tobacco: Never Used  Substance and Sexual Activity  . Alcohol use: Yes    Comment: rarely  . Drug  use: No  . Sexual activity: Not on file  Other Topics Concern  . Not on file  Social History Narrative   Drinks 2 cups of coffee a day    Social Determinants of Health   Financial Resource Strain:   . Difficulty of Paying Living Expenses:   Food Insecurity:   . Worried About Charity fundraiser in the Last Year:   . Arboriculturist in the Last Year:   Transportation Needs:   . Film/video editor (Medical):   Marland Kitchen Lack of Transportation (Non-Medical):   Physical Activity:   . Days of Exercise per Week:   . Minutes of Exercise per Session:   Stress:   . Feeling of Stress :   Social Connections:   . Frequency of Communication with Friends and Family:   . Frequency of Social Gatherings with Friends and Family:   . Attends Religious Services:   . Active Member of Clubs or Organizations:   . Attends Archivist Meetings:   Marland Kitchen Marital Status:      Family History: The patient's family history includes Brain cancer in his father; Colon polyps in his mother; Colonic polyp in his mother; Hypothyroidism in his mother; Irritable bowel syndrome in his mother. There is no history of Esophageal cancer, Stomach cancer, Pancreatic cancer, Liver disease, Colon cancer, or Rectal cancer.  ROS:   Please see the history of present illness.    All other systems reviewed and are negative.  EKGs/Labs/Other Studies Reviewed:    The following studies were reviewed today: Cardiac Cath 04/19/2001: IMPRESSION: 1. Successful stenting in the setting of an acute coronary syndrome of the    circumflex with stenosis going from 99% to 0%. 2. Residual atherosclerotic disease involving the apex of the left anterior    descending, the mid right coronary artery, and the posterior descending    artery, with mild to moderate disease in the proximal left anterior    descending artery. 3. Normal left ventricular function.  EKG:  EKG is ordered today.  The ekg from February 07, 2019 is reviewed.  This  demonstrates normal sinus rhythm and is within normal limits.  Recent Labs: 02/07/2019: ALT 27; BUN 14; Creat 1.02; Hemoglobin 15.8; Magnesium 2.1; Platelets 204; Potassium 4.5; Sodium 139; TSH 2.48  Recent Lipid Panel    Component Value Date/Time   CHOL 153 02/07/2019 0904   TRIG 452 (H) 02/07/2019 0904   HDL 39 (L) 02/07/2019 0904   CHOLHDL 3.9 02/07/2019 0904   VLDL 49 (H) 06/05/2016 0938   LDLCALC  02/07/2019 0904     Comment:     . LDL  cholesterol not calculated. Triglyceride levels greater than 400 mg/dL invalidate calculated LDL results. . Reference range: <100 . Desirable range <100 mg/dL for primary prevention;   <70 mg/dL for patients with CHD or diabetic patients  with > or = 2 CHD risk factors. Marland Kitchen LDL-C is now calculated using the Martin-Hopkins  calculation, which is a validated novel method providing  better accuracy than the Friedewald equation in the  estimation of LDL-C.  Cresenciano Genre et al. Annamaria Helling. [id number]): 2061-2068  (http://education.[facility].com/faq/FAQ164)     Physical Exam:    VS:  BP 102/66   Pulse 72   Ht 5\' 11"  (1.803 m)   Wt 215 lb (97.5 kg)   SpO2 98%   BMI 29.99 kg/m     Wt Readings from Last 3 Encounters:  05/09/19 215 lb (97.5 kg)  05/03/19 213 lb (96.6 kg)  03/31/19 215 lb (97.5 kg)     GEN:  Well nourished, well developed in no acute distress HEENT: Normal NECK: No JVD; No carotid bruits LYMPHATICS: No lymphadenopathy CARDIAC: RRR, no murmurs, rubs, gallops RESPIRATORY:  Clear to auscultation without rales, wheezing or rhonchi  ABDOMEN: Soft, non-tender, non-distended MUSCULOSKELETAL:  No edema; No deformity  SKIN: Warm and dry NEUROLOGIC:  Alert and oriented x 3 PSYCHIATRIC:  Normal affect   ASSESSMENT:    1. Coronary artery disease involving native coronary artery of native heart without angina pectoris   2. Essential hypertension   3. Mixed hyperlipidemia    PLAN:    In order of problems listed  above:  1. The patient is stable with no symptoms of angina.  I reviewed his cardiac catheterization data reported from 2003.  We discussed the findings that included a critically stenosed obtuse marginal treated with bare-metal stenting, severe stenosis of the apical LAD, and moderate diffuse stenosis of the RCA.  He has remained on long-term antiplatelet therapy with clopidogrel.  He is treated with a beta-blocker and a statin drug.  No functional limitation and no cardiac symptoms at present.  I will see him back in 1 year for follow-up evaluation. 2. Blood pressure is well controlled on telmisartan and nebivolol. 3. Lipids reviewed, hypertriglyceridemia noted with triglycerides of 452.  Will increase rosuvastatin to 20 mg daily to get him on a high intensity dose in the setting of coronary artery disease.  We discussed lifestyle and dietary modification at length.  We discussed reinitiation of an exercise program.  We discussed reduction in simple sugars and starches.  We will repeat labs in 6 months.  If triglycerides remain significantly elevated, consider addition of fenofibrate.   Medication Adjustments/Labs and Tests Ordered: Current medicines are reviewed at length with the patient today.  Concerns regarding medicines are outlined above.  Orders Placed This Encounter  Procedures  . Lipid panel  . Hepatic function panel  . Direct LDL   Meds ordered this encounter  Medications  . rosuvastatin (CRESTOR) 20 MG tablet    Sig: Take 1 tablet (20 mg total) by mouth daily.    Dispense:  90 tablet    Refill:  3    Patient Instructions  Medication Instructions:  1) INCREASE CRESTOR to 20 mg daily *If you need a refill on your cardiac medications before your next appointment, please call your pharmacy*  Lab Work: Your provider recommends that you return for FASTING lab work in 6 months. If you have labs (blood work) drawn today and your tests are completely normal, you will receive your  results only by: Marland Kitchen  MyChart Message (if you have MyChart) OR . A paper copy in the mail If you have any lab test that is abnormal or we need to change your treatment, we will call you to review the results.  Follow-Up: At [facility], you and your health needs are our priority.  As part of our continuing mission to provide you with exceptional heart care, we have created designated Provider Care Teams.  These Care Teams include your primary Cardiologist (physician) and Advanced Practice Providers (APPs -  Physician Assistants and Nurse Practitioners) who all work together to provide you with the care you need, when you need it. Your next appointment:   12 month(s) The format for your next appointment:   In Person Provider:   You may see Sherren Mocha, MD or one of the following Advanced Practice Providers on your designated Care Team:    Richardson Dopp, PA-C  Robbie Lis, [place]      Signed, Sherren Mocha, MD  05/09/2019 1:28 PM    [place]

## 2019-05-09 NOTE — Patient Instructions (Signed)
Medication Instructions:  1) INCREASE CRESTOR to 20 mg daily *If you need a refill on your cardiac medications before your next appointment, please call your pharmacy*  Lab Work: Your provider recommends that you return for FASTING lab work in 6 months. If you have labs (blood work) drawn today and your tests are completely normal, you will receive your results only by: Marland Kitchen MyChart Message (if you have MyChart) OR . A paper copy in the mail If you have any lab test that is abnormal or we need to change your treatment, we will call you to review the results.  Follow-Up: At [facility], you and your health needs are our priority.  As part of our continuing mission to provide you with exceptional heart care, we have created designated Provider Care Teams.  These Care Teams include your primary Cardiologist (physician) and Advanced Practice Providers (APPs -  Physician Assistants and Nurse Practitioners) who all work together to provide you with the care you need, when you need it. Your next appointment:   12 month(s) The format for your next appointment:   In Person Provider:   You may see Sherren Mocha, MD or one of the following Advanced Practice Providers on your designated Care Team:    Richardson Dopp, PA-C  Vin Petrey, [place]

## 2019-05-10 NOTE — Progress Notes (Signed)
MEDICARE ANNUAL WELLNESS VISIT AND FOLLOW UP Assessment:   Diagnoses and all orders for this visit:  Encounter for Medicare annual wellness exam  Aortoiliac atherosclerosis ([place]) Per CT 2018 Control blood pressure, cholesterol, glucose, increase exercise.   Symptomatic PVCs Has very rarely; symptoms stable recently.  Continue with medications, followed by cardiology.   Essential hypertension BPs at goal  Monitor blood pressure at home; call if consistently over 130/80 Continue DASH diet.   Reminder to go to the ER if any CP, SOB, nausea, dizziness, severe HA, changes vision/speech, left arm numbness and tingling and jaw pain.  History of PSVT Followed by cardiology  Coronary artery disease involving native coronary artery of native heart, angina presence unspecified Control blood pressure, cholesterol, glucose, increase exercise.  Followed by cardiology  OSA (obstructive sleep apnea) Followed by Dr. Brett Fairy; recent sleep study recommended CPAP, pending titration  CKD (chronic kidney disease) stage 2, GFR 60-89 ml/min Increase fluids, avoid NSAIDS, monitor sugars, will monitor  Vitamin D deficiency Continue supplementation for goal of 70-100 Check vitamin D level  Other abnormal glucose Recent A1Cs at goal Discussed diet/exercise, weight management  Defer A1C; check BMP  Overweight (25-29.9) Long discussion about weight loss, diet, and exercise Recommended diet heavy in fruits and veggies and low in animal meats, cheeses, and dairy products, appropriate calorie intake Discussed appropriate weight for height  Patient on phentermine with benefit and no SE, taking drug breaks;  Follow up at next visit  Medication management CBC, CMP/GFR, LFTs  Primary insomnia Followed by Dr. Brett Fairy, prescribed Eulis Canner sleep hygiene discussed, increase day time activity Continue with careful use of sleep aid agents  Mixed hyperlipidemia Triglycerides remain signicantly  elevated; recently increased rosuvastatin to 20 mg, titrate up as needed, reduce creamer and sugar in coffee Continue statin/zetia Continue low cholesterol diet and exercise.  Check lipid panel.   Erectile dysfunction Continue cialis PRN  Over 30 minutes of exam, counseling, chart review, and critical decision making was performed  Future Appointments  Date Time Provider [place]  05/24/2019  8:00 PM [facility] None  08/02/2019  8:30 AM Ward Givens, NP [facility] None  08/15/2019  9:30 AM [name] Pinto, MD [facility] None  11/07/2019  8:00 AM [facility]  02/21/2020  9:00 AM [name] Pinto, MD [facility] None    Plan:   During the course of the visit the patient was educated and counseled about appropriate screening and preventive services including:    Pneumococcal vaccine   Influenza vaccine  Prevnar 13  Td vaccine  Screening electrocardiogram  Colorectal cancer screening  Diabetes screening  Glaucoma screening  Nutrition counseling    Subjective:  Bryan [name] is a 69 y.o. male who presents for Medicare Annual Wellness Visit and 3 month follow up for HTN, hyperlipidemia, glucose management, and vitamin D Def.   He has ongoing difficulties with sleep, remote hx of OSA and was using oral appliance until recently with concern for chipped tooth, was referred to Dr. Brett Fairy for repeat sleep study which showed severe OSA and recommended CPAP. She is prescribing ambien.   He has depression, recently doing well with wellbutrin 150 mg daily and reports working on lifestyle and feels has improved significantly. Takes trazodone 50 mg daily PRN sleep.   He reports back and hip pain which have essentially resolved following injection with Dr. Ellene Route and Dr. Berenice Primas last year.   BMI is Body mass index is 29.71 kg/m., he has been working  on diet and exercise. Doing stretching exercises, walking, calisthenics, redoing  yard, does a lot of outdoor activities, boating, etc.  Watching white starchy foods, minimal greasy foods, limited red meat. Does poultry, fish. Lots of fruit, nuts, beans. Does mainly whole grain.  Tried phentermine/topiramate, some benefit, takes as needed if gaining weight. Weight down from 221 lb, 204 lb on home scale.  Wt Readings from Last 3 Encounters:  05/12/19 213 lb (96.6 kg)  05/09/19 215 lb (97.5 kg)  05/03/19 213 lb (96.6 kg)   He has hx of PSVT/PVCs but recently improved without episodes in several years.  Patient has hx/o ASCAD with ACS/acute Anterior NSTEMI and PCA w/Stent implantation in 2003. He is on plavix. In 2004 , he had an Ablation for pAfib. Stress Myoview was negative in 2010. Continues on plavix. Recently transitioned to seeing Dr. Burt Knack.   His blood pressure has been controlled at home, today their BP is BP: 100/80 He does workout. He denies chest pain, shortness of breath, dizziness.   He is on cholesterol medication (zetia 10 mg daily, newly increased to rosuvatation 20 mg this week by cardiology, previously 10 mg, omega 3) and denies myalgias. His cholesterol is not at goal. The cholesterol last visit was:    Lab Results  Component Value Date   CHOL 153 02/07/2019   HDL 39 (L) 02/07/2019   [place]  02/07/2019     Comment:     . LDL cholesterol not calculated. Triglyceride levels greater than 400 mg/dL invalidate calculated LDL results. . Reference range: <100 . Desirable range <100 mg/dL for primary prevention;   <70 mg/dL for patients with CHD or diabetic patients  with > or = 2 CHD risk factors. Marland Kitchen LDL-C is now calculated using the Martin-Hopkins  calculation, which is a validated novel method providing  better accuracy than the Friedewald equation in the  estimation of LDL-C.  Cresenciano Genre et al. Annamaria Helling. [id number]): 2061-2068  (http://education.[facility].com/faq/FAQ164)    TRIG 452 (H) 02/07/2019   CHOLHDL 3.9 02/07/2019   He has been  working on diet and exercise for glucose management, and denies foot ulcerations, increased appetite, nausea, paresthesia of the feet, polydipsia, polyuria, visual disturbances, vomiting and weight loss. Last A1C in the office was:  Lab Results  Component Value Date   HGBA1C 5.6 02/07/2019   Last GFR Lab Results  Component Value Date   GFRNONAA 75 02/07/2019    Patient is on Vitamin D supplement and near 60:    Lab Results  Component Value Date   VD25OH 54 02/07/2019        Current Outpatient Medications on File Prior to Visit  Medication Sig Dispense Refill  . albuterol (PROAIR HFA) 108 (90 Base) MCG/ACT inhaler Use 2 inhalations 15 minutes apart every 4 hours if need to rescue Asthma 48 g 3  . buPROPion (WELLBUTRIN XL) 150 MG 24 hr tablet Take 1 tablet every Morning for Mood, Focus & Concentration 90 tablet 3  . clopidogrel (PLAVIX) 75 MG tablet Take 1 tablet (75 mg total) by mouth daily. 90 tablet 3  . ezetimibe (ZETIA) 10 MG tablet Take 1 tablet Daily for Cholesterol 90 tablet 3  . nebivolol (BYSTOLIC) 10 MG tablet Take 1 tablet Daily for BP & Heart 90 tablet 3  . rosuvastatin (CRESTOR) 20 MG tablet Take 1 tablet (20 mg total) by mouth daily. 90 tablet 3  . tadalafil (CIALIS) 20 MG tablet Take 1/2 to 1 tablet every 2 to 3 days as needed  for XXXX 30 tablet 11  . telmisartan (MICARDIS) 80 MG tablet Take 1 tablet Daily for BP 90 tablet 3  . valACYclovir (VALTREX) 500 MG tablet Take 1 tablet 2 x /day for Fever Blisters / Cold Sores 180 tablet 1  . zolpidem (AMBIEN CR) 6.25 MG CR tablet Take 1 tablet (6.25 mg total) by mouth at bedtime as needed for sleep. 30 tablet 1   No current facility-administered medications on file prior to visit.     Allergies: Allergies  Allergen Reactions  . Lisinopril Cough    ACEi cough  . Amoxicillin Hives and Swelling  . Penicillins Swelling    Current Problems (verified) has Hyperlipidemia, mixed; Essential hypertension; Vitamin D  deficiency; Abnormal glucose; Medication management; Coronary artery disease involving native coronary artery of native heart without angina pectoris; History of PSVT; Symptomatic PVCs; CKD (chronic kidney disease) stage 2, GFR 60-89 ml/min; Obesity (BMI 30.0-34.9); Insomnia; ED (erectile dysfunction); Aorto-iliac atherosclerosis ([place]); Psychophysiological insomnia; Dental anomaly; Severe obstructive sleep apnea-hypopnea syndrome; and OSA on CPAP on their problem list.  Screening Tests Immunization History  Administered Date(s) Administered  . Influenza Split 10/13/2013, 10/23/2014  . Influenza, High Dose Seasonal PF 01/09/2017, 10/27/2017  . Influenza,inj,quad, With Preservative 01/13/2013  . Influenza-Unspecified 10/07/2011  . PPD Test 10/13/2013, 10/23/2014  . Pneumococcal Conjugate-13 10/13/2013  . Pneumococcal Polysaccharide-23 10/23/2014  . Td 01/06/2006  . Tdap 03/06/2016  . Zoster 09/22/2011   Preventative care: Last colonoscopy: 2013   CXR 2019 MRI lumbar/hip 2017 Sleep study 04/2019  Prior vaccinations: TD or Tdap: 2018  Influenza: 2019 Pneumococcal: 2016 Prevnar13: 2015 Shingles/Zostavax: 2013 Covid 19: 2/2, 2021, moderna   Names of Other Physician/Practitioners you currently use: 1. [facility] here for primary care 2. Lens, eye doctor, last visit 2019 3. Turner, dentist, last visit 2020  Patient Care Team: [name] Pinto, MD as PCP - General (Internal Medicine) Sherren Mocha, MD as PCP - Cardiology (Cardiology)  Surgical: He  has a past surgical history that includes Appendectomy; Achilles tendon repair; Cardiac electrophysiology mapping and ablation; Coronary angioplasty with stent; and Colonoscopy. Family His family history includes Brain cancer in his father; Colon polyps in his mother; Colonic polyp in his mother; Hypothyroidism in his mother; Irritable bowel syndrome in his mother. Social history  He reports that  he has never smoked. He has never used smokeless tobacco. He reports current alcohol use. He reports that he does not use drugs.  MEDICARE WELLNESS OBJECTIVES: Physical activity: Current Exercise Habits: Home exercise routine, Type of exercise: strength training/weights;yoga;walking, Time (Minutes): 30, Frequency (Times/Week): 7, Weekly Exercise (Minutes/Week): 210, Intensity: Mild, Exercise limited by: None identified Cardiac risk factors: Cardiac Risk Factors include: advanced age (>42men, >45 women);dyslipidemia;hypertension;male gender Depression/mood screen:   Depression screen [facility] 2/9 05/12/2019  Decreased Interest 0  Down, Depressed, Hopeless 1  PHQ - 2 Score 1  Altered sleeping 1  Tired, decreased energy 1  Change in appetite 0  Feeling bad or failure about yourself  0  Trouble concentrating 0  Moving slowly or fidgety/restless 0  Suicidal thoughts 0  PHQ-9 Score 3  Difficult doing work/chores Not difficult at all    ADLs:  In your present state of health, do you have any difficulty performing the following activities: 05/12/2019 09/20/2018  Hearing? N N  Vision? N N  Difficulty concentrating or making decisions? N N  Walking or climbing stairs? N N  Dressing or bathing? N N  Doing errands, shopping? N N  Some  recent data might be hidden     Cognitive Testing  Alert? Yes  Normal Appearance?Yes  Oriented to person? Yes  Place? Yes   Time? Yes  Recall of three objects?  Yes  Can perform simple calculations? Yes  Displays appropriate judgment?Yes  Can read the correct time from a watch face?Yes  EOL planning: Does Patient Have a Medical Advance Directive?: Yes Type of Advance Directive: Healthcare Power of Attorney, Living will Does patient want to make changes to medical advance directive?: No - Patient declined Copy of [place] in Chart?: Yes - validated most recent copy scanned in chart (See row information)   Objective:   Today's Vitals    05/12/19 1053  BP: 100/80  Pulse: 93  Temp: 97.6 F (36.4 C)  SpO2: 96%  Weight: 213 lb (96.6 kg)  Height: 5\' 11"  (1.803 m)   Body mass index is 29.71 kg/m.  General Appearance: Well nourished, in no apparent distress. Eyes: PERRLA, EOMs, conjunctiva no swelling or erythema Sinuses: No Frontal/maxillary tenderness ENT/Mouth: Ext aud canals clear, TMs without erythema, bulging. No erythema, swelling, or exudate on post pharynx.  Tonsils not swollen or erythematous. Hearing normal.  Neck: Supple, thyroid normal.  Respiratory: Respiratory effort normal, BS equal bilaterally without rales, rhonchi, wheezing or stridor.  Cardio: RRR with no MRGs. Brisk peripheral pulses without edema.  Abdomen: Soft, + BS.  Non tender, no guarding, rebound, hernias, masses. Lymphatics: Non tender without lymphadenopathy.  Musculoskeletal: Full ROM, 5/5 strength, Normal gait.  Skin: Warm, dry without rashes, lesions, ecchymosis.  Neuro: Cranial nerves intact. No cerebellar symptoms.  Psych: Awake and oriented X 3, normal affect, Insight and Judgment appropriate.   Medicare Attestation I have personally reviewed: The patient's medical and social history Their use of alcohol, tobacco or illicit drugs Their current medications and supplements The patient's functional ability including ADLs,fall risks, home safety risks, cognitive, and hearing and visual impairment Diet and physical activities Evidence for depression or mood disorders  The patient's weight, height, BMI, and visual acuity have been recorded in the chart.  I have made referrals, counseling, and provided education to the patient based on review of the above and I have provided the patient with a written personalized care plan for preventive services.     Izora Ribas, NP   05/12/2019

## 2019-05-12 ENCOUNTER — Other Ambulatory Visit: Payer: Self-pay

## 2019-05-12 ENCOUNTER — Encounter: Payer: Self-pay | Admitting: Adult Health

## 2019-05-12 ENCOUNTER — Ambulatory Visit (INDEPENDENT_AMBULATORY_CARE_PROVIDER_SITE_OTHER): Payer: Medicare Other | Admitting: Adult Health

## 2019-05-12 VITALS — BP 100/80 | HR 93 | Temp 97.6°F | Ht 71.0 in | Wt 213.0 lb

## 2019-05-12 DIAGNOSIS — I493 Ventricular premature depolarization: Secondary | ICD-10-CM | POA: Diagnosis not present

## 2019-05-12 DIAGNOSIS — G4733 Obstructive sleep apnea (adult) (pediatric): Secondary | ICD-10-CM | POA: Diagnosis not present

## 2019-05-12 DIAGNOSIS — I2510 Atherosclerotic heart disease of native coronary artery without angina pectoris: Secondary | ICD-10-CM | POA: Diagnosis not present

## 2019-05-12 DIAGNOSIS — R7309 Other abnormal glucose: Secondary | ICD-10-CM | POA: Diagnosis not present

## 2019-05-12 DIAGNOSIS — I708 Atherosclerosis of other arteries: Secondary | ICD-10-CM

## 2019-05-12 DIAGNOSIS — E559 Vitamin D deficiency, unspecified: Secondary | ICD-10-CM

## 2019-05-12 DIAGNOSIS — I10 Essential (primary) hypertension: Secondary | ICD-10-CM | POA: Diagnosis not present

## 2019-05-12 DIAGNOSIS — E782 Mixed hyperlipidemia: Secondary | ICD-10-CM

## 2019-05-12 DIAGNOSIS — I471 Supraventricular tachycardia: Secondary | ICD-10-CM

## 2019-05-12 DIAGNOSIS — E669 Obesity, unspecified: Secondary | ICD-10-CM

## 2019-05-12 DIAGNOSIS — R6889 Other general symptoms and signs: Secondary | ICD-10-CM

## 2019-05-12 DIAGNOSIS — N182 Chronic kidney disease, stage 2 (mild): Secondary | ICD-10-CM

## 2019-05-12 DIAGNOSIS — Z0001 Encounter for general adult medical examination with abnormal findings: Secondary | ICD-10-CM | POA: Diagnosis not present

## 2019-05-12 DIAGNOSIS — F5104 Psychophysiologic insomnia: Secondary | ICD-10-CM | POA: Diagnosis not present

## 2019-05-12 DIAGNOSIS — I700 Atherosclerosis of aorta: Secondary | ICD-10-CM | POA: Diagnosis not present

## 2019-05-12 DIAGNOSIS — Z79899 Other long term (current) drug therapy: Secondary | ICD-10-CM

## 2019-05-12 DIAGNOSIS — Z0000 Encounter for general adult medical examination without abnormal findings: Secondary | ICD-10-CM

## 2019-05-12 DIAGNOSIS — Z9989 Dependence on other enabling machines and devices: Secondary | ICD-10-CM

## 2019-05-12 DIAGNOSIS — N529 Male erectile dysfunction, unspecified: Secondary | ICD-10-CM

## 2019-05-12 NOTE — Patient Instructions (Addendum)
  Bryan [name] , Thank you for taking time to come for your Medicare Wellness Visit. I appreciate your ongoing commitment to your health goals. Please review the following plan we discussed and let me know if I can assist you in the future.   These are the goals we discussed: Goals    . Weight (lb) < 185 lb (83.9 kg)       This is a list of the screening recommended for you and due dates:  Health Maintenance  Topic Date Due  . COVID-19 Vaccine (1) Never done  . Flu Shot  08/07/2019  . Pneumonia vaccines (2 of 2 - PPSV23) 10/23/2019  . Colon Cancer Screening  05/25/2021  . Tetanus Vaccine  03/07/2026  .  Hepatitis C: One time screening is recommended by Center for Disease Control  (CDC) for  adults born from [birth year] through 1965.   Completed    Please send a photo of your covid 19 vaccine card   Suggest reducing coffee creamer - try oat milk or other blends  Try stevia or truvia, monk fruit sweeteners instead of splenda    A great goal to work towards is aiming to get in a serving daily of some of the most nutritionally dense foods - G- BOMBS daily

## 2019-05-13 LAB — CBC WITH DIFFERENTIAL/PLATELET
Absolute Monocytes: 546 cells/uL (ref 200–950)
Basophils Absolute: 21 cells/uL (ref 0–200)
Basophils Relative: 0.4 %
Eosinophils Absolute: 419 cells/uL (ref 15–500)
Eosinophils Relative: 7.9 %
HCT: 47.7 % (ref 38.5–50.0)
Hemoglobin: 16.1 g/dL (ref 13.2–17.1)
Lymphs Abs: 1500 cells/uL (ref 850–3900)
MCH: 29.4 pg (ref 27.0–33.0)
MCHC: 33.8 g/dL (ref 32.0–36.0)
MCV: 87.2 fL (ref 80.0–100.0)
MPV: 10.3 fL (ref 7.5–12.5)
Monocytes Relative: 10.3 %
Neutro Abs: 2814 cells/uL (ref 1500–7800)
Neutrophils Relative %: 53.1 %
Platelets: 223 10*3/uL (ref 140–400)
RBC: 5.47 10*6/uL (ref 4.20–5.80)
RDW: 12.2 % (ref 11.0–15.0)
Total Lymphocyte: 28.3 %
WBC: 5.3 10*3/uL (ref 3.8–10.8)

## 2019-05-13 LAB — COMPLETE METABOLIC PANEL WITH GFR
AG Ratio: 1.8 (calc) (ref 1.0–2.5)
ALT: 21 U/L (ref 9–46)
AST: 20 U/L (ref 10–35)
Albumin: 4.2 g/dL (ref 3.6–5.1)
Alkaline phosphatase (APISO): 72 U/L (ref 35–144)
BUN: 13 mg/dL (ref 7–25)
CO2: 27 mmol/L (ref 20–32)
Calcium: 9.5 mg/dL (ref 8.6–10.3)
Chloride: 107 mmol/L (ref 98–110)
Creat: 0.94 mg/dL (ref 0.70–1.25)
GFR, Est African American: 96 mL/min/{1.73_m2} (ref >=60)
GFR, Est Non African American: 83 mL/min/{1.73_m2} (ref >=60)
Globulin: 2.4 g/dL (calc) (ref 1.9–3.7)
Glucose, Bld: 120 mg/dL — ABNORMAL HIGH (ref 65–99)
Potassium: 4.2 mmol/L (ref 3.5–5.3)
Sodium: 140 mmol/L (ref 135–146)
Total Bilirubin: 0.6 mg/dL (ref 0.2–1.2)
Total Protein: 6.6 g/dL (ref 6.1–8.1)

## 2019-05-13 LAB — LIPID PANEL
Cholesterol: 164 mg/dL (ref <200)
HDL: 40 mg/dL (ref >=40)
LDL Cholesterol (Calc): 94 mg/dL (calc)
Non-HDL Cholesterol (Calc): 124 mg/dL (calc) (ref <130)
Total CHOL/HDL Ratio: 4.1 (calc) (ref <5.0)
Triglycerides: 210 mg/dL — ABNORMAL HIGH (ref <150)

## 2019-05-13 LAB — TSH: TSH: 1.86 mIU/L (ref 0.40–4.50)

## 2019-05-13 LAB — MAGNESIUM: Magnesium: 2.1 mg/dL (ref 1.5–2.5)

## 2019-05-16 DIAGNOSIS — M4722 Other spondylosis with radiculopathy, cervical region: Secondary | ICD-10-CM | POA: Diagnosis not present

## 2019-05-24 ENCOUNTER — Ambulatory Visit (INDEPENDENT_AMBULATORY_CARE_PROVIDER_SITE_OTHER): Payer: Medicare Other | Admitting: Neurology

## 2019-05-24 DIAGNOSIS — G4733 Obstructive sleep apnea (adult) (pediatric): Secondary | ICD-10-CM

## 2019-05-24 DIAGNOSIS — Z789 Other specified health status: Secondary | ICD-10-CM

## 2019-05-24 DIAGNOSIS — G4739 Other sleep apnea: Secondary | ICD-10-CM

## 2019-05-24 DIAGNOSIS — K009 Disorder of tooth development, unspecified: Secondary | ICD-10-CM

## 2019-05-24 DIAGNOSIS — G4734 Idiopathic sleep related nonobstructive alveolar hypoventilation: Secondary | ICD-10-CM

## 2019-05-24 DIAGNOSIS — G4709 Other insomnia: Secondary | ICD-10-CM

## 2019-06-01 ENCOUNTER — Telehealth: Payer: Self-pay

## 2019-06-01 ENCOUNTER — Telehealth: Payer: Self-pay | Admitting: Neurology

## 2019-06-01 DIAGNOSIS — Z789 Other specified health status: Secondary | ICD-10-CM | POA: Insufficient documentation

## 2019-06-01 DIAGNOSIS — G4734 Idiopathic sleep related nonobstructive alveolar hypoventilation: Secondary | ICD-10-CM | POA: Insufficient documentation

## 2019-06-01 DIAGNOSIS — G4733 Obstructive sleep apnea (adult) (pediatric): Secondary | ICD-10-CM | POA: Insufficient documentation

## 2019-06-01 MED ORDER — ZOLPIDEM TARTRATE ER 6.25 MG PO TBCR: 6.2500 mg | EXTENDED_RELEASE_TABLET | Freq: Every evening | ORAL | 1 refills | Status: DC | PRN

## 2019-06-01 NOTE — Addendum Note (Signed)
Addended by: [name] on: 06/01/2019 02:04 PM   Modules accepted: Orders

## 2019-06-01 NOTE — Telephone Encounter (Signed)
I called pt. I advised pt that Dr. Brett Fairy reviewed their sleep study results and found that pt did well during the cpap titration with cpap. Dr. Brett Fairy recommends that pt start auto cpap in the home for treatment. I reviewed PAP compliance expectations with the pt. Pt is agreeable to starting an auto-PAP. I advised pt that an order will be sent to a DME, [facility], and [facility] will call the pt within about one week after they file with the pt's insurance. [facility] will show the pt how to use the machine, fit for masks, and troubleshoot the auto-PAP if needed. A follow up appt was made for insurance purposes with Dr. Brett Fairy on 08/11/2019 at 130 pm. Pt verbalized understanding to arrive 15 minutes early and bring their auto-PAP. A letter with all of this information in it will be mailed to the pt as a reminder. I verified with the pt that the address we have on file is correct. Pt verbalized understanding of results. Pt had no questions at this time but was encouraged to call back if questions arise. I have sent the order to [facility] and have received confirmation that they have received the order.

## 2019-06-01 NOTE — Telephone Encounter (Signed)
-----   Message from [name], MD sent at 06/01/2019  2:04 PM EDT ----- DIAGNOSIS :Complex -Central Sleep Apnea with REM sleep  accentuation and hypoxemia as determined by HST was markedly  improved.  1. Supine sleep position still exacerbated RDI and AHI.  2. Sleep Related Hypoxemia was resolved at final pressure.  3. Central sleep apnea was reduced under CPAP.  4. PLMs were alleviated.   PLANS/RECOMMENDATIONS: The mask seems to have fit the patient  well, a Respironics Dreamwear large sized full-face mask.  The auto CPAP settings should allow for pressure from 8-18 cm  water, 2 cm EPR and heated humidification. AVOIDING supine sleep  was still essential in controlling apnea.

## 2019-06-01 NOTE — Telephone Encounter (Signed)
Patient will likely continue with ambiene use as he is getting familiar with CPAP use. In order to increase chance of compliance with CPAP therapy, I will refill.

## 2019-06-01 NOTE — Procedures (Signed)
PATIENT'S NAME:  Bryan [name], Bryan [name] DOB:      05-Aug-1950      Bryan#:    [id number]     DATE OF RECORDING: 05/24/2019 [facility] REFERRING M.D.:  [name] Pinto, MD Study Performed:   Titration to positive airway pressure HISTORY:   RV on 05-03-2019  Established  69 year-old male patient with known OSA and failed CPAP therapy ( can't tolerate interface selected last time) , failed responding to dental device , reported high fatigue and struggle with insomnia.  Bryan [name] is returning after PSG test and reports ongoing dependency on Ambien- cannot sleep with trazodone. He had undergone a home sleep test on April 14 and today the home sleep test confirmed an AHI of 30.9 which is considered severe apnea, much higher REM sleep dependent AHI of 52.3 and a complete 9-hour sleep time.  Very concerning however 95.8 minutes out of his total sleep time there in oxygen desaturation.  Positional data did not register.  Based on these home sleep test data I think that a dental device is not an appropriate treatment.  There is such as strict dependency on REM sleep with oxygen desaturation and variable heart rates but only a CPAP device is actually an appropriate treatment.  Even the inspire device is not as likely to help him overcome REM dependent sleep apnea.  He continues to have trouble sleeping at night and reported that he slept rather well when using Ambien.  I would consider him by now dependent on sleep aids and I will continue to provide the medication that he needs to get sleep also we will probably need to alternate 4 days of Ambien with 3 days of trazodone or something else to help him slowly wean off. Offered Belsomra. He uses melatonin, has tried oils, soft meditative music, all of which have helped to relax, none helps to sleep though. Bryan. [name], 69 years of age, and a patient with OSA and failed PAP therapy- he has already been vaccinated for Covid, both shots with Moderna.  Patient returning for a CPAP Titration sleep  study after having an HST on 04/20/19. Patient had an AHI of 30.9 and REM AHI of 52.3/h.  The patient endorsed the Epworth Sleepiness Scale at 16/24..   The patient's weight 215 pounds with a height of 70 (inches), resulting in a BMI of 30.9 kg/m2. The patient's neck circumference measured 17 inches.  CURRENT MEDICATIONS: Albuterol, Wellbutrin XL, Plavix, Zetia, Bystolic, Crestor, Micardis, Topamax, Valtrex, Eszopiclone, Cialis,   PROCEDURE:  This is a multichannel digital polysomnogram utilizing the SomnoStar 11.2 system.  Electrodes and sensors were applied and monitored per AASM Specifications.   EEG, EOG, Chin and Limb EMG, were sampled at 200 Hz.  ECG, Snore and Nasal Pressure, Thermal Airflow, Respiratory Effort, CPAP Flow and Pressure, Oximetry was sampled at 50 Hz. Digital video and audio were recorded.     Sleep tech selected a Respironics Dreamwear large size FFM- CPAP was initiated at 5 cmH20 with heated humidity per AASM split night standards and pressure was advanced to 16 cmH20 because of hypopneas, apneas and desaturations.   At the final explored PAP pressure of 16 cmH20, there was a reduction of the AHI to 1.7/h with improvement of sleep apnea.  Lights Out was at 21:24 and Lights On at 04:56. Total recording time (TRT) was 452.5 minutes, with a total sleep time (TST) of 368 minutes. The patient's sleep latency was 34.5 minutes. REM latency was 67 minutes.  The sleep efficiency  was 81.3 %.    SLEEP ARCHITECTURE: WASO (Wake after sleep onset) was at 39.5 minutes. There were 12 minutes in Stage N1, 316 minutes Stage N2, 0 minutes Stage N3 and 40 minutes in Stage REM.  The percentage of Stage N1 was 3.3%, Stage N2 was 85.9%, Stage N3 was 0% and Stage R (REM sleep) was at 10.9%.   RESPIRATORY ANALYSIS:  There was a total of 41 respiratory events: 9 obstructive apneas, 14 central apneas and 18 hypopneas. The patient had few respiratory event related arousals (RERAs).     The total  APNEA/HYPOPNEA INDEX  (AHI) was 6.7 /hour and the total RESPIRATORY DISTURBANCE INDEX was 7.7 /hour.  1 event occurred in REM sleep and 40 events in NREM. The REM AHI was 1.5 /hour versus a non-REM AHI of 7.3 /hour.  The patient spent 149 minutes of total sleep time in the supine position and 219 minutes in non-supine. The supine AHI was 13.3/h, versus a non-supine AHI of 2.1/h.  OXYGEN SATURATION & C02:  The baseline 02 saturation was 92%, with the lowest being 68%. Time spent below 89% saturation equaled only 8 minutes.  PERIODIC LIMB MOVEMENTS:  The patient had a total of 384 Periodic Limb Movements. The Periodic Limb Movement (PLM) Arousal index was 0.7 /hour. Some Snoring was noted. EKG was in keeping with normal sinus rhythm.  DIAGNOSIS :Complex -Central Sleep Apnea with REM sleep accentuation and hypoxemia as determined by HST was markedly improved.  1. Supine sleep position still exacerbated RDI and AHI.  2. Sleep Related Hypoxemia was resolved at final pressure. 3. Central sleep apnea was reduced under CPAP. 4. PLMs were alleviated.   PLANS/RECOMMENDATIONS: The mask seems to have fit the patient well, a Respironics Dreamwear large sized full-face mask. The auto CPAP settings should allow for pressure from 8-18 cm water, 2 cm EPR and heated humidification. AVOIDING supine sleep was still essential in controlling apnea.    DISCUSSION: A follow up appointment will be scheduled in the Sleep Clinic at [facility].   Please call [phone] with any questions.     I certify that I have reviewed the entire raw data recording prior to the issuance of this report in accordance with the Standards of Accreditation of the American Academy of Sleep Medicine (AASM)  [name], M.D. Diplomat, Tax adviser of Psychiatry and Neurology  Diplomat, Tax adviser of Sleep Medicine Market researcher, [employer] [facility] at [employer]

## 2019-06-01 NOTE — Progress Notes (Signed)
DIAGNOSIS :Complex -Central Sleep Apnea with REM sleep  accentuation and hypoxemia as determined by HST was markedly  improved.  1. Supine sleep position still exacerbated RDI and AHI.  2. Sleep Related Hypoxemia was resolved at final pressure.  3. Central sleep apnea was reduced under CPAP.  4. PLMs were alleviated.   PLANS/RECOMMENDATIONS: The mask seems to have fit the patient  well, a Respironics Dreamwear large sized full-face mask.  The auto CPAP settings should allow for pressure from 8-18 cm  water, 2 cm EPR and heated humidification. AVOIDING supine sleep  was still essential in controlling apnea.

## 2019-06-08 DIAGNOSIS — G5601 Carpal tunnel syndrome, right upper limb: Secondary | ICD-10-CM | POA: Diagnosis not present

## 2019-06-08 DIAGNOSIS — I10 Essential (primary) hypertension: Secondary | ICD-10-CM | POA: Diagnosis not present

## 2019-06-08 DIAGNOSIS — Z6828 Body mass index (BMI) 28.0-28.9, adult: Secondary | ICD-10-CM | POA: Diagnosis not present

## 2019-06-08 DIAGNOSIS — M4722 Other spondylosis with radiculopathy, cervical region: Secondary | ICD-10-CM | POA: Diagnosis not present

## 2019-06-14 NOTE — Telephone Encounter (Signed)
Received this message from [place]  Pt is not eligible for a new machine at this time. We provided pt a CPAP on 02/14/16 and it was paid in full by Medicare. Pt not eligible for a new unit until 2023.   FYI only

## 2019-06-20 ENCOUNTER — Other Ambulatory Visit: Payer: Self-pay | Admitting: Adult Health

## 2019-07-08 ENCOUNTER — Other Ambulatory Visit: Payer: Self-pay | Admitting: Neurology

## 2019-08-02 ENCOUNTER — Ambulatory Visit: Payer: Medicare Other | Admitting: Adult Health

## 2019-08-14 NOTE — Patient Instructions (Signed)

## 2019-08-14 NOTE — Progress Notes (Signed)
History of Present Illness:       This very nice 69 y.o.  [name] presents for  6 month follow up with HTN, ASCAD, HLD, Pre-Diabetes and Vitamin D Deficiency. Patient has severe OSA  on CPAP followed by Dr Brett Fairy. Patient is followed by Dr Ellene Route for Cx DDD.       Patient is treated for HTN & BP has been controlled at home. Today's BP is at goal - 124/82.  In 2003, patient underwent PCA / Stent for ACS and has done well since. In 2004 & 2010, he had ablations for pAfib. Patient is followed by Dr Burt Knack. Patient has had no complaints of any cardiac type chest pain, palpitations, dyspnea / orthopnea / PND, dizziness, claudication, or dependent edema.      Hyperlipidemia is controlled with diet & Rosuvastatin / ezertimibe. Patient denies myalgias or other med SE's. Last Lipids were at goal except elevated Trig's:  Lab Results  Component Value Date   CHOL 164 05/12/2019   HDL 40 05/12/2019   LDLCALC 94 05/12/2019   TRIG 210 (H) 05/12/2019   CHOLHDL 4.1 05/12/2019    Also, the patient has history of PreDiabetes (A1c 5.8% /  2019)  and has had no symptoms of reactive hypoglycemia, diabetic polys, paresthesias or visual blurring.  Last A1c was Normal & at goal:  Lab Results  Component Value Date   HGBA1C 5.6 02/07/2019       Further, the patient also has history of Vitamin D Deficiency ("26" / 2008) and supplements vitamin D without any suspected side-effects. Last vitamin D was near goal:  Lab Results  Component Value Date   VD25OH 54 02/07/2019    Current Outpatient Medications on File Prior to Visit  Medication Sig  . albuterol (PROAIR HFA) 108 (90 Base) MCG/ACT inhaler Use 2 inhalations 15 minutes apart every 4 hours if need to rescue Asthma  . buPROPion (WELLBUTRIN XL) 150 MG 24 hr tablet Take 1 tablet every Morning for Mood, Focus & Concentration  . clopidogrel (PLAVIX) 75 MG tablet TAKE ONE TABLET BY MOUTH DAILY  . ezetimibe (ZETIA) 10 MG tablet Take 1 tablet Daily for  Cholesterol  . nebivolol (BYSTOLIC) 10 MG tablet Take 1 tablet Daily for BP & Heart  . rosuvastatin (CRESTOR) 20 MG tablet Take 1 tablet (20 mg total) by mouth daily.  . tadalafil (CIALIS) 20 MG tablet Take 1/2 to 1 tablet every 2 to 3 days as needed for XXXX  . telmisartan (MICARDIS) 80 MG tablet Take 1 tablet Daily for BP  . valACYclovir (VALTREX) 500 MG tablet Take 1 tablet 2 x /day for Fever Blisters / Cold Sores  . zolpidem (AMBIEN CR) 6.25 MG CR tablet Take 1 tablet (6.25 mg total) by mouth at bedtime as needed for sleep.   No current facility-administered medications on file prior to visit.    Allergies  Allergen Reactions  . Lisinopril Cough    ACEi cough  . Amoxicillin Hives and Swelling  . Penicillins Swelling    PMHx:   Past Medical History:  Diagnosis Date  . Allergy   . CAD (coronary artery disease), native coronary artery    Cath 04/19/2001  normal Left main, moderate proximal disease, 80% stenosis distal LAD, 99% stenosis proximal OM 1, 60% stenosis mid RCA, 70% stenosis ostial mid PDA  PCI with bare metal stent of OM1 same date 3.5 x 18 mm Multilink    . Diverticulosis   . GERD (  gastroesophageal reflux disease)   . Hernia   . History of PSVT    Ablation done in 2004 at [facility]   . Hyperlipidemia   . Hypertension   . Sleep apnea   . Symptomatic PVCs   . Vitamin D deficiency     Immunization History  Administered Date(s) Administered  . Influenza Split 10/13/2013, 10/23/2014  . Influenza, High Dose Seasonal PF 01/09/2017, 10/27/2017  . Influenza,inj,quad, With Preservative 01/13/2013  . Influenza-Unspecified 10/07/2011  . PPD Test 10/13/2013, 10/23/2014  . Pneumococcal Conjugate-13 10/13/2013  . Pneumococcal Polysaccharide-23 10/23/2014  . Td 01/06/2006  . Tdap 03/06/2016  . Zoster 09/22/2011    Past Surgical History:  Procedure Laterality Date  . ACHILLES TENDON REPAIR     left  . APPENDECTOMY    . CARDIAC ELECTROPHYSIOLOGY MAPPING AND  ABLATION    . COLONOSCOPY    . CORONARY ANGIOPLASTY WITH STENT PLACEMENT     years ago    FHx:    Reviewed / unchanged  SHx:    Reviewed / unchanged   Systems Review:  Constitutional: Denies fever, chills, wt changes, headaches, insomnia, fatigue, night sweats, change in appetite. Eyes: Denies redness, blurred vision, diplopia, discharge, itchy, watery eyes.  ENT: Denies discharge, congestion, post nasal drip, epistaxis, sore throat, earache, hearing loss, dental pain, tinnitus, vertigo, sinus pain, snoring.  CV: Denies chest pain, palpitations, irregular heartbeat, syncope, dyspnea, diaphoresis, orthopnea, PND, claudication or edema. Respiratory: denies cough, dyspnea, DOE, pleurisy, hoarseness, laryngitis, wheezing.  Gastrointestinal: Denies dysphagia, odynophagia, heartburn, reflux, water brash, abdominal pain or cramps, nausea, vomiting, bloating, diarrhea, constipation, hematemesis, melena, hematochezia  or hemorrhoids. Genitourinary: Denies dysuria, frequency, urgency, nocturia, hesitancy, discharge, hematuria or flank pain. Musculoskeletal: Denies arthralgias, myalgias, stiffness, jt. swelling, pain, limping or strain/sprain.  Skin: Denies pruritus, rash, hives, warts, acne, eczema or change in skin lesion(s). Neuro: No weakness, tremor, incoordination, spasms, paresthesia or pain. Psychiatric: Denies confusion, memory loss or sensory loss. Endo: Denies change in weight, skin or hair change.  Heme/Lymph: No excessive bleeding, bruising or enlarged lymph nodes.  Physical Exam  BP 124/82   Pulse 80   Temp (!) 97 F (36.1 C)   Resp 16   Ht 5\' 9"  (1.753 m)   Wt 214 lb 12.8 oz (97.4 kg)   BMI 31.72 kg/m   Appears  well nourished, well groomed  and in no distress.  Eyes: PERRLA, EOMs, conjunctiva no swelling or erythema. Sinuses: No frontal/maxillary tenderness ENT/Mouth: EAC's clear, TM's nl w/o erythema, bulging. Nares clear w/o erythema, swelling, exudates. Oropharynx  clear without erythema or exudates. Oral hygiene is good. Tongue normal, non obstructing. Hearing intact.  Neck: Supple. Thyroid not palpable. Car 2+/2+ without bruits, nodes or JVD. Chest: Respirations nl with BS clear & equal w/o rales, rhonchi, wheezing or stridor.  Cor: Heart sounds normal w/ regular rate and rhythm without sig. murmurs, gallops, clicks or rubs. Peripheral pulses normal and equal  without edema.  Abdomen: Soft & bowel sounds normal. Non-tender w/o guarding, rebound, hernias, masses or organomegaly.  Lymphatics: Unremarkable.  Musculoskeletal: Full ROM all peripheral extremities, joint stability, 5/5 strength and normal gait.  Skin: Warm, dry without exposed rashes, lesions or ecchymosis apparent.  Neuro: Cranial nerves intact, reflexes equal bilaterally. Sensory-motor testing grossly intact. Tendon reflexes grossly intact.  Pysch: Alert & oriented x 3.  Insight and judgement nl & appropriate. No ideations.  Assessment and Plan:  1. Essential hypertension  - Continue medication, monitor blood pressure at home.  - Continue  DASH diet.  Reminder to go to the ER if any CP,  SOB, nausea, dizziness, severe HA, changes vision/speech.  - CBC with Differential/Platelet - COMPLETE METABOLIC PANEL WITH GFR - Magnesium - TSH  2. Hyperlipidemia, mixed  - Continue diet/meds, exercise,& lifestyle modifications.  - Continue monitor periodic cholesterol/liver & renal functions   - Lipid panel - TSH  3. Abnormal glucose  - Continue diet, exercise  - Lifestyle modifications.  - Monitor appropriate labs.  - Hemoglobin A1c - Insulin, random  4. Vitamin D deficiency  - Continue supplementation.  - VITAMIN D 25 Hydroxy  5. Coronary artery disease involving native coronary  artery of native heart without angina pectoris  - Lipid panel  6. OSA on CPAP   7. Medication management  - CBC with Differential/Platelet - COMPLETE METABOLIC PANEL WITH GFR - Magnesium -  Lipid panel - TSH - Hemoglobin A1c - Insulin, random - VITAMIN D 25 Hydroxy        Discussed  regular exercise, BP monitoring, weight control to achieve/maintain BMI less than 25 and discussed med and SE's. Recommended labs to assess and monitor clinical status with further disposition pending results of labs.  I discussed the assessment and treatment plan with the patient. The patient was provided an opportunity to ask questions and all were answered. The patient agreed with the plan and demonstrated an understanding of the instructions.  I provided over 30 minutes of exam, counseling, chart review and  complex critical decision making.         The patient was advised to call back or seek an in-person evaluation if the symptoms worsen or if the condition fails to improve as anticipated.   Kirtland Bouchard, MD

## 2019-08-15 ENCOUNTER — Encounter: Payer: Self-pay | Admitting: Internal Medicine

## 2019-08-15 ENCOUNTER — Other Ambulatory Visit: Payer: Self-pay

## 2019-08-15 ENCOUNTER — Ambulatory Visit (INDEPENDENT_AMBULATORY_CARE_PROVIDER_SITE_OTHER): Payer: Medicare Other | Admitting: Internal Medicine

## 2019-08-15 VITALS — BP 124/82 | HR 80 | Temp 97.0°F | Resp 16 | Ht 69.0 in | Wt 214.8 lb

## 2019-08-15 DIAGNOSIS — I2510 Atherosclerotic heart disease of native coronary artery without angina pectoris: Secondary | ICD-10-CM | POA: Diagnosis not present

## 2019-08-15 DIAGNOSIS — I708 Atherosclerosis of other arteries: Secondary | ICD-10-CM | POA: Diagnosis not present

## 2019-08-15 DIAGNOSIS — Z79899 Other long term (current) drug therapy: Secondary | ICD-10-CM

## 2019-08-15 DIAGNOSIS — E782 Mixed hyperlipidemia: Secondary | ICD-10-CM | POA: Diagnosis not present

## 2019-08-15 DIAGNOSIS — I10 Essential (primary) hypertension: Secondary | ICD-10-CM | POA: Diagnosis not present

## 2019-08-15 DIAGNOSIS — R7309 Other abnormal glucose: Secondary | ICD-10-CM | POA: Diagnosis not present

## 2019-08-15 DIAGNOSIS — G4733 Obstructive sleep apnea (adult) (pediatric): Secondary | ICD-10-CM | POA: Diagnosis not present

## 2019-08-15 DIAGNOSIS — I700 Atherosclerosis of aorta: Secondary | ICD-10-CM | POA: Diagnosis not present

## 2019-08-15 DIAGNOSIS — E559 Vitamin D deficiency, unspecified: Secondary | ICD-10-CM

## 2019-08-15 DIAGNOSIS — Z9989 Dependence on other enabling machines and devices: Secondary | ICD-10-CM | POA: Diagnosis not present

## 2019-08-15 MED ORDER — NITROGLYCERIN 0.4 MG SL SUBL
SUBLINGUAL_TABLET | SUBLINGUAL | 11 refills | Status: DC
Start: 2019-08-15 — End: 2023-05-21

## 2019-08-16 LAB — CBC WITH DIFFERENTIAL/PLATELET
Absolute Monocytes: 470 cells/uL (ref 200–950)
Basophils Absolute: 29 cells/uL (ref 0–200)
Basophils Relative: 0.6 %
Eosinophils Absolute: 289 cells/uL (ref 15–500)
Eosinophils Relative: 5.9 %
HCT: 45.1 % (ref 38.5–50.0)
Hemoglobin: 15.4 g/dL (ref 13.2–17.1)
Lymphs Abs: 1617 cells/uL (ref 850–3900)
MCH: 29.6 pg (ref 27.0–33.0)
MCHC: 34.1 g/dL (ref 32.0–36.0)
MCV: 86.7 fL (ref 80.0–100.0)
MPV: 10.5 fL (ref 7.5–12.5)
Monocytes Relative: 9.6 %
Neutro Abs: 2494 cells/uL (ref 1500–7800)
Neutrophils Relative %: 50.9 %
Platelets: 217 10*3/uL (ref 140–400)
RBC: 5.2 10*6/uL (ref 4.20–5.80)
RDW: 12.4 % (ref 11.0–15.0)
Total Lymphocyte: 33 %
WBC: 4.9 10*3/uL (ref 3.8–10.8)

## 2019-08-16 LAB — COMPLETE METABOLIC PANEL WITH GFR
AG Ratio: 2.2 (calc) (ref 1.0–2.5)
ALT: 23 U/L (ref 9–46)
AST: 19 U/L (ref 10–35)
Albumin: 4.2 g/dL (ref 3.6–5.1)
Alkaline phosphatase (APISO): 59 U/L (ref 35–144)
BUN: 11 mg/dL (ref 7–25)
CO2: 27 mmol/L (ref 20–32)
Calcium: 9.1 mg/dL (ref 8.6–10.3)
Chloride: 106 mmol/L (ref 98–110)
Creat: 1.11 mg/dL (ref 0.70–1.25)
GFR, Est African American: 78 mL/min/{1.73_m2} (ref >=60)
GFR, Est Non African American: 67 mL/min/{1.73_m2} (ref >=60)
Globulin: 1.9 g/dL (calc) (ref 1.9–3.7)
Glucose, Bld: 106 mg/dL — ABNORMAL HIGH (ref 65–99)
Potassium: 4.2 mmol/L (ref 3.5–5.3)
Sodium: 139 mmol/L (ref 135–146)
Total Bilirubin: 0.7 mg/dL (ref 0.2–1.2)
Total Protein: 6.1 g/dL (ref 6.1–8.1)

## 2019-08-16 LAB — HEMOGLOBIN A1C
Hgb A1c MFr Bld: 5.7 % of total Hgb — ABNORMAL HIGH (ref <5.7)
Mean Plasma Glucose: 117 (calc)
eAG (mmol/L): 6.5 (calc)

## 2019-08-16 LAB — LIPID PANEL
Cholesterol: 117 mg/dL (ref <200)
HDL: 38 mg/dL — ABNORMAL LOW (ref >=40)
LDL Cholesterol (Calc): 53 mg/dL (calc)
Non-HDL Cholesterol (Calc): 79 mg/dL (calc) (ref <130)
Total CHOL/HDL Ratio: 3.1 (calc) (ref <5.0)
Triglycerides: 192 mg/dL — ABNORMAL HIGH (ref <150)

## 2019-08-16 LAB — MAGNESIUM: Magnesium: 1.9 mg/dL (ref 1.5–2.5)

## 2019-08-16 LAB — INSULIN, RANDOM: Insulin: 31 u[IU]/mL — ABNORMAL HIGH

## 2019-08-16 LAB — VITAMIN D 25 HYDROXY (VIT D DEFICIENCY, FRACTURES): Vit D, 25-Hydroxy: 52 ng/mL (ref 30–100)

## 2019-08-16 LAB — TSH: TSH: 2.14 mIU/L (ref 0.40–4.50)

## 2019-08-16 NOTE — Progress Notes (Signed)
===========================================================  -    Total Chol = 117 and LDL = 53 - Both  Excellent   - Very low risk for Heart Attack  / Stroke =============================================================  - A1c = 5.7% - Borderline elevated Sugar    - Avoid Sweets, Candy & White Stuff   - Rice, Potatoes, Breads &  Pasta ===========================================================  -  Vitamin D = 52 - sl low   - Vitamin D goal is between 70-100.   - Please make sure that you are taking your Vitamin D as directed.   - It is very important as a natural anti-inflammatory and helping the  immune system protect against viral infections, like the Covid-19    helping hair, skin, and nails, as well as reducing stroke and  heart attack risk.   - It helps your bones and helps with mood.  - It also decreases numerous cancer risks so please take it as directed.   - Low Vit D is associated with a 200-300% higher risk for CANCER   and 200-300% higher risk for HEART   ATTACK  &  STROKE.    - It is also associated with higher death rate at younger ages,   autoimmune diseases like Rheumatoid arthritis, Lupus,  Multiple Sclerosis.      - Also many other serious conditions, like depression, Alzheimer's  Dementia, infertility, muscle aches, fatigue, fibromyalgia  - just to name a few.  ==========================================================  - All Else - CBC - Kidneys - Electrolytes - Liver - Magnesium & Thyroid    - all  Normal / OK ====================================================

## 2019-08-25 DIAGNOSIS — L820 Inflamed seborrheic keratosis: Secondary | ICD-10-CM | POA: Diagnosis not present

## 2019-08-25 DIAGNOSIS — Z85828 Personal history of other malignant neoplasm of skin: Secondary | ICD-10-CM | POA: Diagnosis not present

## 2019-08-25 DIAGNOSIS — L570 Actinic keratosis: Secondary | ICD-10-CM | POA: Diagnosis not present

## 2019-08-25 DIAGNOSIS — L814 Other melanin hyperpigmentation: Secondary | ICD-10-CM | POA: Diagnosis not present

## 2019-08-25 DIAGNOSIS — D485 Neoplasm of uncertain behavior of skin: Secondary | ICD-10-CM | POA: Diagnosis not present

## 2019-08-25 DIAGNOSIS — L821 Other seborrheic keratosis: Secondary | ICD-10-CM | POA: Diagnosis not present

## 2019-08-25 DIAGNOSIS — B079 Viral wart, unspecified: Secondary | ICD-10-CM | POA: Diagnosis not present

## 2019-08-25 DIAGNOSIS — D225 Melanocytic nevi of trunk: Secondary | ICD-10-CM | POA: Diagnosis not present

## 2019-08-25 DIAGNOSIS — D1801 Hemangioma of skin and subcutaneous tissue: Secondary | ICD-10-CM | POA: Diagnosis not present

## 2019-08-25 DIAGNOSIS — M7130 Other bursal cyst, unspecified site: Secondary | ICD-10-CM | POA: Diagnosis not present

## 2019-09-15 ENCOUNTER — Other Ambulatory Visit: Payer: Self-pay | Admitting: Neurology

## 2019-09-15 NOTE — Telephone Encounter (Signed)
Continued at same dose. CD

## 2019-09-17 ENCOUNTER — Other Ambulatory Visit: Payer: Self-pay | Admitting: Internal Medicine

## 2019-09-17 DIAGNOSIS — I2510 Atherosclerotic heart disease of native coronary artery without angina pectoris: Secondary | ICD-10-CM

## 2019-09-17 DIAGNOSIS — I10 Essential (primary) hypertension: Secondary | ICD-10-CM

## 2019-09-23 ENCOUNTER — Other Ambulatory Visit: Payer: Self-pay

## 2019-09-23 ENCOUNTER — Ambulatory Visit (INDEPENDENT_AMBULATORY_CARE_PROVIDER_SITE_OTHER): Payer: Medicare Other | Admitting: Adult Health

## 2019-09-23 ENCOUNTER — Encounter: Payer: Self-pay | Admitting: Adult Health

## 2019-09-23 VITALS — BP 128/88 | Temp 97.7°F | Wt 209.0 lb

## 2019-09-23 DIAGNOSIS — I708 Atherosclerosis of other arteries: Secondary | ICD-10-CM

## 2019-09-23 DIAGNOSIS — Z1152 Encounter for screening for COVID-19: Secondary | ICD-10-CM

## 2019-09-23 DIAGNOSIS — B349 Viral infection, unspecified: Secondary | ICD-10-CM | POA: Diagnosis not present

## 2019-09-23 DIAGNOSIS — I700 Atherosclerosis of aorta: Secondary | ICD-10-CM | POA: Diagnosis not present

## 2019-09-23 LAB — POC COVID19 BINAXNOW: SARS Coronavirus 2 Ag: NEGATIVE

## 2019-09-23 MED ORDER — PROMETHAZINE HCL 25 MG PO TABS: 12.5000 mg | ORAL_TABLET | Freq: Four times a day (QID) | ORAL | 0 refills | Status: DC | PRN

## 2019-09-23 MED ORDER — PREDNISONE 20 MG PO TABS
ORAL_TABLET | ORAL | 0 refills | Status: DC
Start: 2019-09-23 — End: 2019-10-10

## 2019-09-23 NOTE — Progress Notes (Signed)
Assessment and Plan:  Bryler was seen today for chills and fever.  Diagnoses and all orders for this visit:  Nonspecific syndrome suggestive of viral illness Encounter for screening for COVID-19 Rapid covid 19 negative in vaccinated individual Sx suggestive of viral etiology but non-specific, sx sig improved today  Possible false negative mild breakthrough covid, possible influenza (opted not to test as too late for antivirals, sx improving, benign exam today) Discussed the importance of avoiding unnecessary antibiotic therapy. Suggested supportive therapy; OTC suggestions given Can do promethazine DM which he has for cough and mild nausea, also sent in promethazine if low dose in cough syrup is insufficient Continue tylenol/ibuprofen PRN, prednisone instead of ibuprofen tonight only if worsening again Advised to stay at home until sx are fully resolved for 24-48 hours without antipyretic  Follow up if any new/worsening sx -     POC COVID-19 BinaxNow - negative -     predniSONE (DELTASONE) 20 MG tablet; 2 tablets daily for 3 days, 1 tablet daily for 4 days. -     promethazine (PHENERGAN) 25 MG tablet; Take 0.5-1 tablets (12.5-25 mg total) by mouth every 6 (six) hours as needed for nausea or vomiting.  Further disposition pending results of labs. Discussed med's effects and SE's.   Over 15 minutes of exam, counseling, chart review, and critical decision making was performed.   Future Appointments  Date Time Provider [place]  10/11/2019  8:30 AM Ward Givens, NP [facility] None  11/07/2019  8:00 AM [facility]  11/21/2019  8:45 AM McClanahan, Danton Sewer, NP [facility] None  02/21/2020  9:00 AM [name] Pinto, MD [facility] None    ------------------------------------------------------------------------------------------------------------------   HPI BP 128/88   Temp 97.7 F (36.5 C)   Wt 209 lb (94.8 kg)   SpO2 98%   BMI 30.86 kg/m   69  y.o.male with htn, OSA on CPAP, presents for evaluation of body aches, fever. Covid 19 vaccinated, rapid screen today was negative.   He reports began with fatigue 5 days ago, felt "like I hit a wall" - then Tuesday had vague lower abdominal discomfort (no pain, no n/v/d, mild gas, some queasiness), poor appetite, deep ache in his R forearm started, then became very achy all over, more myalgia and also felt very tender all over, also with headache (top of head, ache, 6/10, has improved this AM), yesterday felt the worst, with fever/chills, temp ~102, had dry heaving last night, was taking tylenol/ibuprofen, broke fever overnight, feeling much improved today. He reports mild achyness, mild headache but otherwise resolved. He reports very mild cough, mild sense of congestion deep in high chest, but feels mild. Denies dyspnea, wheezing, chest aching. Denies sore throat, nasal congetion, drainage, sinus sx, rash.   He was vaccinated, moderna 2/2, in Feb/March. No known sick contacts but was at [facility] lodge over the weekend, also was at [facility] on Monday due to wife dislocating her shoulder.   Past Medical History:  Diagnosis Date  . Allergy   . CAD (coronary artery disease), native coronary artery    Cath 04/19/2001  normal Left main, moderate proximal disease, 80% stenosis distal LAD, 99% stenosis proximal OM 1, 60% stenosis mid RCA, 70% stenosis ostial mid PDA  PCI with bare metal stent of OM1 same date 3.5 x 18 mm Multilink    . Diverticulosis   . GERD (gastroesophageal reflux disease)   . Hernia   . History of PSVT    [facility]   . Hyperlipidemia   . Hypertension   . Sleep apnea   . Symptomatic PVCs   . Vitamin D deficiency      Allergies  Allergen Reactions  . Lisinopril Cough    ACEi cough  . Amoxicillin Hives and Swelling  . Penicillins Swelling    Current Outpatient Medications on File Prior to Visit  Medication Sig  . albuterol (PROAIR HFA) 108 (90  Base) MCG/ACT inhaler Use 2 inhalations 15 minutes apart every 4 hours if need to rescue Asthma  . buPROPion (WELLBUTRIN XL) 150 MG 24 hr tablet Take 1 tablet every Morning for Mood, Focus & Concentration  . clopidogrel (PLAVIX) 75 MG tablet TAKE ONE TABLET BY MOUTH DAILY  . ezetimibe (ZETIA) 10 MG tablet Take 1 tablet Daily for Cholesterol  . nebivolol (BYSTOLIC) 10 MG tablet Take 1 tablet Daily for BP & Heart  . nitroGLYCERIN (NITROSTAT) 0.4 MG SL tablet Dissolve 1 tablet under tongue every 3 to 5 minutes if needed for Angina  . rosuvastatin (CRESTOR) 20 MG tablet Take 1 tablet (20 mg total) by mouth daily.  . tadalafil (CIALIS) 20 MG tablet Take 1/2 to 1 tablet every 2 to 3 days as needed for XXXX  . telmisartan (MICARDIS) 80 MG tablet Take 1 tablet Daily for BP  . valACYclovir (VALTREX) 500 MG tablet Take 1 tablet 2 x /day for Fever Blisters / Cold Sores  . zolpidem (AMBIEN CR) 6.25 MG CR tablet Take 1 tablet (6.25 mg total) by mouth at bedtime as needed for sleep.   No current facility-administered medications on file prior to visit.    ROS: all negative except above.   Physical Exam:  BP 128/88   Temp 97.7 F (36.5 C)   Wt 209 lb (94.8 kg)   SpO2 98%   BMI 30.86 kg/m   General Appearance: Well nourished, in no apparent distress. Eyes: PERRLA, EOMs, conjunctiva no swelling or erythema Sinuses: No Frontal/maxillary tenderness ENT/Mouth: Ext aud canals clear, TMs without erythema, bulging. No erythema, swelling, or exudate on post pharynx.  Tonsils not swollen or erythematous. Hearing normal.  Neck: Supple, thyroid normal.  Respiratory: Respiratory effort normal, BS equal bilaterally without rales, rhonchi, wheezing or stridor.  Cardio: RRR with no MRGs. Brisk peripheral pulses without edema.  Abdomen: Soft, + BS.  Non tender, no guarding, rebound, hernias, masses. Lymphatics: Non tender without lymphadenopathy.  Musculoskeletal: Intact ROM, no effusions, no obvious deformity,  normal gait.  Skin: Warm, dry without rashes, lesions, ecchymosis.  Neuro: Cranial nerves intact. Normal muscle tone, no cerebellar symptoms. Sensation intact.  Psych: Awake and oriented X 3, normal affect, Insight and Judgment appropriate.     Izora Ribas, NP 11:47 AM [facility]

## 2019-09-23 NOTE — Patient Instructions (Addendum)
  Try promethazine DM cough syrup first if start getting nauseous If not helping, try the nausea medication    Prednisone instead of aleve only if needed - don't need if continue to improve     HOW TO TREAT VIRAL COUGH AND COLD SYMPTOMS:  -Symptoms usually last at least 1 week with the worst symptoms being around day 4.  - colds usually start with a sore throat and end with a cough, and the cough can take 2 weeks to get better.  -No antibiotics are needed for colds, flu, sore throats, cough, bronchitis UNLESS symptoms are longer than 7 days OR if you are getting better then get drastically worse.  -There are a lot of combination medications (Dayquil, Nyquil, Vicks 44, tyelnol cold and sinus, ETC). Please look at the ingredients on the back so that you are treating the correct symptoms and not doubling up on medications/ingredients.    Medicines you can use  Nasal congestion  Little Remedies saline spray (aerosol/mist)- can try this, it is in the kids section - pseudoephedrine (Sudafed)- behind the counter, do not use if you have high blood pressure, medicine that have -D in them.  - phenylephrine (Sudafed PE) -Dextormethorphan + chlorpheniramine (Coridcidin HBP)- okay if you have high blood pressure -Oxymetazoline (Afrin) nasal spray- LIMIT to 3 days -Saline nasal spray -Neti pot (used distilled or bottled water)  Ear pain/congestion  -pseudoephedrine (sudafed) - Nasonex/flonase nasal spray  Fever  -Acetaminophen (Tyelnol) -Ibuprofen (Advil, motrin, aleve)  Sore Throat  -Acetaminophen (Tyelnol) -Ibuprofen (Advil, motrin, aleve) -Drink a lot of water -Gargle with salt water - Rest your voice (don't talk) -Throat sprays -Cough drops  Body Aches  -Acetaminophen (Tyelnol) -Ibuprofen (Advil, motrin, aleve)  Headache  -Acetaminophen (Tyelnol) -Ibuprofen (Advil, motrin, aleve) - Exedrin, Exedrin Migraine  Allergy symptoms (cough, sneeze, runny nose, itchy eyes) -Claritin  or loratadine cheapest but likely the weakest  -Zyrtec or certizine at night because it can make you sleepy -The strongest is allegra or fexafinadine  Cheapest at [employer], [employer], [employer]  Cough  -Dextromethorphan (Delsym)- medicine that has DM in it -Guafenesin (Mucinex/Robitussin) - cough drops - drink lots of water  Chest Congestion  -Guafenesin (Mucinex/Robitussin)  Red Itchy Eyes  - Naphcon-A  Upset Stomach  - Bland diet (nothing spicy, greasy, fried, and high acid foods like tomatoes, oranges, berries) -OKAY- cereal, bread, soup, crackers, rice -Eat smaller more frequent meals -reduce caffeine, no alcohol -Loperamide (Imodium-AD) if diarrhea -Prevacid for heart burn  General health when sick  -Hydration -wash your hands frequently -keep surfaces clean -change pillow cases and sheets often -Get fresh air but do not exercise strenuously -Vitamin D, double up on it - Vitamin C -Zinc

## 2019-09-25 DIAGNOSIS — Z20828 Contact with and (suspected) exposure to other viral communicable diseases: Secondary | ICD-10-CM | POA: Diagnosis not present

## 2019-09-29 ENCOUNTER — Other Ambulatory Visit: Payer: Self-pay | Admitting: Internal Medicine

## 2019-09-29 ENCOUNTER — Telehealth: Payer: Self-pay

## 2019-09-29 ENCOUNTER — Other Ambulatory Visit: Payer: Self-pay | Admitting: Adult Health

## 2019-09-29 DIAGNOSIS — F32A Depression, unspecified: Secondary | ICD-10-CM

## 2019-09-29 MED ORDER — CIPROFLOXACIN HCL 500 MG PO TABS
500.0000 mg | ORAL_TABLET | Freq: Two times a day (BID) | ORAL | 0 refills | Status: DC
Start: 2019-09-29 — End: 2019-10-10

## 2019-09-29 MED ORDER — METRONIDAZOLE 500 MG PO TABS: 500.0000 mg | ORAL_TABLET | Freq: Three times a day (TID) | ORAL | 0 refills | Status: DC

## 2019-09-29 NOTE — Telephone Encounter (Signed)
Patient wanted to let you know that last night he had a reoccurrence of fatigue, muscle aches, tenderness to touch body, head and hair, sharp stabbing abdominal pain, slight wheezing, oxygen in the low 90's, temperature 99-102, congestion, dehydrated, and dark yellow urine. Has been taking zinc, tylenol, advil, water and Gatorade. This morning, feeling mostly normal. Has my chart if you need to send a message.

## 2019-10-10 ENCOUNTER — Ambulatory Visit (INDEPENDENT_AMBULATORY_CARE_PROVIDER_SITE_OTHER): Payer: Medicare Other | Admitting: Internal Medicine

## 2019-10-10 ENCOUNTER — Other Ambulatory Visit: Payer: Self-pay

## 2019-10-10 ENCOUNTER — Encounter: Payer: Self-pay | Admitting: Internal Medicine

## 2019-10-10 ENCOUNTER — Ambulatory Visit
Admission: RE | Admit: 2019-10-10 | Discharge: 2019-10-10 | Disposition: A | Discharge status: Home / Self Care | Payer: Medicare Other | Source: Ambulatory Visit | Attending: Internal Medicine | Admitting: Internal Medicine

## 2019-10-10 VITALS — BP 114/68 | HR 96 | Temp 97.0°F | Resp 16 | Ht 69.0 in | Wt 212.2 lb

## 2019-10-10 DIAGNOSIS — I708 Atherosclerosis of other arteries: Secondary | ICD-10-CM

## 2019-10-10 DIAGNOSIS — R509 Fever, unspecified: Secondary | ICD-10-CM | POA: Diagnosis not present

## 2019-10-10 DIAGNOSIS — R102 Pelvic and perineal pain: Secondary | ICD-10-CM | POA: Diagnosis not present

## 2019-10-10 DIAGNOSIS — I700 Atherosclerosis of aorta: Secondary | ICD-10-CM | POA: Diagnosis not present

## 2019-10-10 DIAGNOSIS — K5730 Diverticulosis of large intestine without perforation or abscess without bleeding: Secondary | ICD-10-CM | POA: Diagnosis not present

## 2019-10-10 DIAGNOSIS — R972 Elevated prostate specific antigen [PSA]: Secondary | ICD-10-CM | POA: Diagnosis not present

## 2019-10-10 DIAGNOSIS — R1030 Lower abdominal pain, unspecified: Secondary | ICD-10-CM

## 2019-10-10 DIAGNOSIS — N419 Inflammatory disease of prostate, unspecified: Secondary | ICD-10-CM

## 2019-10-10 DIAGNOSIS — R110 Nausea: Secondary | ICD-10-CM | POA: Diagnosis not present

## 2019-10-10 DIAGNOSIS — K5792 Diverticulitis of intestine, part unspecified, without perforation or abscess without bleeding: Secondary | ICD-10-CM

## 2019-10-10 MED ORDER — CIPROFLOXACIN HCL 500 MG PO TABS: 500.0000 mg | ORAL_TABLET | Freq: Two times a day (BID) | ORAL | 0 refills | Status: DC

## 2019-10-10 MED ORDER — METRONIDAZOLE 500 MG PO TABS: 500.0000 mg | ORAL_TABLET | Freq: Three times a day (TID) | ORAL | 0 refills | Status: DC

## 2019-10-10 NOTE — Progress Notes (Signed)
========================================================== -   Test results slightly outside the reference range are not unusual. If there is anything important, I will review this with you,  otherwise it is considered normal test values.  If you have further questions,  please do not hesitate to contact me at the office or via My Chart.  ==========================================================  -  Lower Chest - OK ==========================================================  -  Liver  - GB - OK ==========================================================  -  Pancreas, spleen, Kidneys, Adrenals, Bladder& bones - All OK - No cancer ==========================================================  -  Small inguinal (groin) hernias - Not likely cause of pain ==========================================================  - Small Intestines - OK  -  Colon - shows diverticulosis - No definite Diverticulitis   ==========================================================  -  continue with antibiotics Now  ==========================================================

## 2019-10-10 NOTE — Progress Notes (Signed)
History of Present Illness:  Patient presents with sx's of bilat lower abd pain awakening him during the night  And hada associated chills & diaphoresis . He also has had more frequent LUTS and sensation of incomplete bladder emptying. Colonoscopy in May 2020 found only a few sigmoid Diverticula. Remote Appendectomy in 1965.   Medications   Current Outpatient Medications (Cardiovascular):    ezetimibe (ZETIA) 10 MG tablet, Take 1 tablet Daily for Cholesterol   nebivolol (BYSTOLIC) 10 MG tablet, Take 1 tablet Daily for BP & Heart   nitroGLYCERIN (NITROSTAT) 0.4 MG SL tablet, Dissolve 1 tablet under tongue every 3 to 5 minutes if needed for Angina   rosuvastatin (CRESTOR) 20 MG tablet, Take 1 tablet (20 mg total) by mouth daily.   tadalafil (CIALIS) 20 MG tablet, Take 1/2 to 1 tablet every 2 to 3 days as needed for XXXX   telmisartan (MICARDIS) 80 MG tablet, Take 1 tablet Daily for BP  Current Outpatient Medications (Respiratory):    albuterol (PROAIR HFA) 108 (90 Base) MCG/ACT inhaler, Use 2 inhalations 15 minutes apart every 4 hours if need to rescue Asthma   promethazine (PHENERGAN) 25 MG tablet, Take 0.5-1 tablets (12.5-25 mg total) by mouth every 6 (six) hours as needed for nausea or vomiting.   Current Outpatient Medications (Hematological):    clopidogrel (PLAVIX) 75 MG tablet, TAKE ONE TABLET BY MOUTH DAILY  Current Outpatient Medications (Other):    buPROPion (WELLBUTRIN XL) 150 MG 24 hr tablet, Take    1 tablet     Daily    for Mood, Focus & Concentration   valACYclovir (VALTREX) 500 MG tablet, Take 1 tablet 2 x /day for Fever Blisters / Cold Sores   zolpidem (AMBIEN CR) 6.25 MG CR tablet, Take 1 tablet (6.25 mg total) by mouth at bedtime as needed for sleep.   ciprofloxacin (CIPRO) 500 MG tablet, Take 1 tablet (500 mg total) by mouth 2 (two) times daily for 7 days.   metroNIDAZOLE (FLAGYL) 500 MG tablet, Take 1 tablet (500 mg total) by mouth 3 (three) times  daily for 7 days.  Problem list He has Hyperlipidemia, mixed; Essential hypertension; Vitamin D deficiency; Abnormal glucose; Medication management; Coronary artery disease involving native coronary artery of native heart without angina pectoris; History of PSVT; Symptomatic PVCs; CKD (chronic kidney disease) stage 2, GFR 60-89 ml/min; Obesity (BMI 30.0-34.9); Insomnia; ED (erectile dysfunction); Aorto-iliac atherosclerosis ([place]); Psychophysiological insomnia; Dental anomaly; Severe obstructive sleep apnea-hypopnea syndrome; OSA on CPAP; Chronic intermittent hypoxia with obstructive sleep apnea; and Intolerance of continuous positive airway pressure (CPAP) ventilation on their problem list.   Observations/Objective:   BP 114/68    Pulse 96    Temp (!) 97 F (36.1 C)    Resp 16    Ht 5\' 9"  (1.753 m)    Wt 212 lb 3.2 oz (96.3 kg)    SpO2 97%    BMI 31.34 kg/m   HEENT - WNL. Neck - supple.  Chest - Clear equal BS. Cor - Nl HS. RRR w/o sig MGR. PP 1(+). No edema. MS- FROM w/o deformities.  Gait Nl. Abd - Soft with No upper abdominal tenderness. Has bilateral lower abdominal tenderness to deep palpitation, but no guarding or rebound . BS normoactive.  DRE - finds prostate not enlarged or tender.  Neuro -  Nl w/o focal abnormalities.  Assessment and Plan:  1. Lower abdominal pain  - CBC with Differential/Platelet  - CT Abdomen Pelvis Wo Contrast  2. Diverticulitis  or Colitis  suspected  - metroNIDAZOLE (FLAGYL) 500 MG tablet; Take 1 tablet (500 mg total) by mouth 3 (three) times daily for 7 days.  Dispense: 21 tablet  - ciprofloxacin (CIPRO) 500 MG tablet; Take 1 tablet (500 mg total) by mouth 2 (two) times daily for 7 days.  Dispense: 14 tablet  - CT Abdomen Pelvis Wo Contrast   3. Prostatitis, suspected  - ciprofloxacin (CIPRO) 500 MG tablet; Take 1 tablet (500 mg total) by mouth 2 (two) times daily for 7 days.  Dispense: 14 tablet  - PSA - Urine Culture - Urinalysis, Routine  w reflex microscopic - CT Abdomen Pelvis Wo Contrast; Future  4. Elevated PSA  - PSA  Follow Up Instructions:         I discussed the assessment and treatment plan with the patient. The patient was provided an opportunity to ask questions and all were answered. The patient agreed with the plan and demonstrated an understanding of the instructions.        The patient was advised to call back or seek an in-person evaluation if the symptoms worsen or if the condition fails to improve as anticipated.   Kirtland Bouchard, MD

## 2019-10-11 ENCOUNTER — Ambulatory Visit: Payer: Medicare Other | Admitting: Adult Health

## 2019-10-13 ENCOUNTER — Other Ambulatory Visit: Payer: Self-pay | Admitting: Internal Medicine

## 2019-10-13 DIAGNOSIS — N138 Other obstructive and reflux uropathy: Secondary | ICD-10-CM

## 2019-10-13 LAB — CBC WITH DIFFERENTIAL/PLATELET
Absolute Monocytes: 678 cells/uL (ref 200–950)
Basophils Absolute: 67 cells/uL (ref 0–200)
Basophils Relative: 0.5 %
Eosinophils Absolute: 146 cells/uL (ref 15–500)
Eosinophils Relative: 1.1 %
HCT: 44.6 % (ref 38.5–50.0)
Hemoglobin: 14.6 g/dL (ref 13.2–17.1)
Lymphs Abs: 745 cells/uL — ABNORMAL LOW (ref 850–3900)
MCH: 29 pg (ref 27.0–33.0)
MCHC: 32.7 g/dL (ref 32.0–36.0)
MCV: 88.7 fL (ref 80.0–100.0)
MPV: 9.7 fL (ref 7.5–12.5)
Monocytes Relative: 5.1 %
Neutro Abs: 11664 cells/uL — ABNORMAL HIGH (ref 1500–7800)
Neutrophils Relative %: 87.7 %
Platelets: 318 10*3/uL (ref 140–400)
RBC: 5.03 10*6/uL (ref 4.20–5.80)
RDW: 12.2 % (ref 11.0–15.0)
Total Lymphocyte: 5.6 %
WBC: 13.3 10*3/uL — ABNORMAL HIGH (ref 3.8–10.8)

## 2019-10-13 LAB — URINE CULTURE
MICRO NUMBER:: 11027995
SPECIMEN QUALITY:: ADEQUATE

## 2019-10-13 LAB — URINALYSIS, ROUTINE W REFLEX MICROSCOPIC
Bilirubin Urine: NEGATIVE
Glucose, UA: NEGATIVE
Hgb urine dipstick: NEGATIVE
Ketones, ur: NEGATIVE
Leukocytes,Ua: NEGATIVE
Nitrite: NEGATIVE
Protein, ur: NEGATIVE
Specific Gravity, Urine: 1.011 (ref 1.001–1.03)
pH: 6 (ref 5.0–8.0)

## 2019-10-13 LAB — PSA: PSA: 1.7 ng/mL (ref <=4.0)

## 2019-10-13 MED ORDER — TAMSULOSIN HCL 0.4 MG PO CAPS: ORAL_CAPSULE | ORAL | 0 refills | Status: DC

## 2019-10-13 MED ORDER — SULFAMETHOXAZOLE-TRIMETHOPRIM 800-160 MG PO TABS: ORAL_TABLET | ORAL | 0 refills | Status: DC

## 2019-10-13 NOTE — Progress Notes (Signed)
========================================================== -     If you have further questions,  please do not hesitate to contact me at the office or via My Chart.  ==========================================================  -  Urine culture finally returned & does show an E.coli Bacterial infection    which is the most common cause of Prostate infections  - It also suggests a better antibiotic choice for the infection is a sulfa drug, so I sent in a Rx to your [facility] for generic Septra  - Take 2 x /day with food  x 3 weeks & also important to drink lots of fluids   - Stop the Cipro & Flagyl (Metronidazole)   - Also sending in a 2sd med called "Flomax" (Tamsulosin) to help with bladder emptying - don't anticipate this to be a long term med.    - Then suggest 4 week Nurse Visit to recheck urine & PSA.   P.S.  - Zinc 50 mg tab daily is good for the Prostate, Testosterone and also helping the immune systems against vial infections like Covid , etc

## 2019-11-07 ENCOUNTER — Other Ambulatory Visit: Payer: Medicare Other | Admitting: *Deleted

## 2019-11-07 ENCOUNTER — Other Ambulatory Visit: Payer: Self-pay

## 2019-11-07 DIAGNOSIS — E782 Mixed hyperlipidemia: Secondary | ICD-10-CM

## 2019-11-07 DIAGNOSIS — I2510 Atherosclerotic heart disease of native coronary artery without angina pectoris: Secondary | ICD-10-CM | POA: Diagnosis not present

## 2019-11-07 LAB — HEPATIC FUNCTION PANEL
ALT: 35 IU/L (ref 0–44)
AST: 23 IU/L (ref 0–40)
Albumin: 4.6 g/dL (ref 3.8–4.8)
Alkaline Phosphatase: 67 IU/L (ref 44–121)
Bilirubin Total: 0.3 mg/dL (ref 0.0–1.2)
Bilirubin, Direct: 0.11 mg/dL (ref 0.00–0.40)
Total Protein: 6.3 g/dL (ref 6.0–8.5)

## 2019-11-07 LAB — LIPID PANEL
Chol/HDL Ratio: 2.6 ratio (ref 0.0–5.0)
Cholesterol, Total: 134 mg/dL (ref 100–199)
HDL: 51 mg/dL (ref >39)
LDL Chol Calc (NIH): 61 mg/dL (ref 0–99)
Triglycerides: 127 mg/dL (ref 0–149)
VLDL Cholesterol Cal: 22 mg/dL (ref 5–40)

## 2019-11-07 LAB — LDL CHOLESTEROL, DIRECT: LDL Direct: 59 mg/dL (ref 0–99)

## 2019-11-17 ENCOUNTER — Other Ambulatory Visit: Payer: Self-pay

## 2019-11-17 ENCOUNTER — Ambulatory Visit (INDEPENDENT_AMBULATORY_CARE_PROVIDER_SITE_OTHER): Payer: Medicare Other | Admitting: Neurology

## 2019-11-17 ENCOUNTER — Encounter: Payer: Self-pay | Admitting: Neurology

## 2019-11-17 VITALS — BP 119/73 | HR 76 | Ht 71.0 in | Wt 214.0 lb

## 2019-11-17 DIAGNOSIS — I700 Atherosclerosis of aorta: Secondary | ICD-10-CM

## 2019-11-17 DIAGNOSIS — G4734 Idiopathic sleep related nonobstructive alveolar hypoventilation: Secondary | ICD-10-CM

## 2019-11-17 DIAGNOSIS — F5101 Primary insomnia: Secondary | ICD-10-CM

## 2019-11-17 DIAGNOSIS — I708 Atherosclerosis of other arteries: Secondary | ICD-10-CM | POA: Diagnosis not present

## 2019-11-17 DIAGNOSIS — G4733 Obstructive sleep apnea (adult) (pediatric): Secondary | ICD-10-CM

## 2019-11-17 MED ORDER — ZALEPLON 5 MG PO CAPS
5.0000 mg | ORAL_CAPSULE | Freq: Every evening | ORAL | 1 refills | Status: DC | PRN
Start: 2019-11-17 — End: 2020-03-19

## 2019-11-17 NOTE — Progress Notes (Signed)
SLEEP [facility]INE [facility]IC   Provider:  [name], MD  Referring Provider: [name] Pinto, MD Primary Care Physician:  [name] Pinto, MD  Chief Complaint  Patient presents with  . Follow-up    pt alone, rm 10. pt attempted months ago to retry using the machine. states that he just could not sleep. he couldnt falls asleep with it even with taking the ambien CR.  he is using adjustable bed and since buying that his snoring has gotten better. he states that he is still having to take the Ambien CR at bedtime, he wanted to advise that although he knows he isnt suppose to bite the tablet, he has found if he bites on it couple times (not chewing it up) he has now found sleeps better.     RV on 11-17-2019: I have the pleasure of seeing Bryan [name] again he had undergone a polysomnography test and had previously had a home sleep test which confirmed an AHI of 30.9.  His titration to positive airway pressure took place on 18 May of this year under a Respironics DreamWear mask this was a full facemask which spares the nose and was used in size large.  The patient did well actually as long as he could avoid sleeping on his back.  Sleep-related hypoxemia improved on the final pressure of titration and central apneas were actually reduced on the CPAP there were still some PLM's but much less than in the there is at the beginning of the study.  The total oxygen desaturation time equaled only 8 minutes.  I ordered an auto CPAP setting between 8 and 18 cmH2O best to centimeter EPR and try to persuade the patient to sleep on his back.  He has noticed at home that he has much less restless legs now but he has not been able to use his CPAP in the lab.date Response him to have been to his CPAP at 16 cmH2O, and therefore his AutoSet was meant to encompass the pressure when he uses the machine his AHI is under 3/h with some residual obstructive apneas but he has not been able to sleep with it on it is not  particularly that the mask or machine bothers him it is his underlying insomnia but has not improved and makes it so hard for him to sleep with or without machine.  He now uses an adjustable bed at home and that to him feels much better so what I like for him to try as we are changing his sleep aid #1 again and will exchange the 6.8 mg Ambien CR to  an immediate release-  I will offer Sonata - 5 mg.   PS: Wife reports a lot of microsleep attacks.  -    RV on 05-03-2019 CD. Bryan [name] is returning after PSG test and reports ongoing dependency on Ambien- cannot sleep with trazodone. He had undergone a home sleep test on April 14 and today the home sleep test confirmed an AHI of 30.9 which is considered severe apnea, much higher REM sleep dependent AHI of 52.3 and a complete 9-hour sleep time.  Very concerning however 95.8 minutes out of his total sleep time there in oxygen desaturation.  Positional data did not register.  Based on these home sleep test data I think that a dental device is not an appropriate treatment.  There is such as strict dependency on REM sleep with oxygen desaturation and variable heart rates but only a CPAP device is actually an  appropriate treatment.  Even the inspire device is not as likely to help him overcome REM dependent sleep apnea.  His continues to endorse the Epworth Sleepiness Scale at 16 out of 24 points, there was no fatigue endorsed.  He continues to have trouble sleeping at night and reported that he slept rather well when using Ambien.  I would consider him by now dependent on sleep aids and I will continue to provide the medication that he needs to get sleep also we will probably need to alternate 4 days of Ambien with 3 days of trazodone or something else to help him slowly wean off. Offered belsomra. He uses melatonin. He has tried oils, soft meditative music, all helps to relax, none helps to sleep through.   RV on 03-31-2019, Bryan [name], 69 years of age, and a  patient with OSA and failed PAP therapy- he has already been vaccinated for Covid, both shots, Moderna. Dr Ron Parker made a dental device that has only been partially effective as there is REM dependent apnea. We meet to re cap and look for alternatives. Bryan [name] who prefers to go by brain has had trouble with his front teeth especially his left canine, and he relates the breakage of the tooth to the difficulties and resistance of the dental device he wore.  So he is reluctant to get back on it but also because it can cause jaw pain as well as not treating his apnea effectively.  Dr. Ron Parker' had shown that there was still a high residual AHI. Dr. Ron Parker allowed me to review his diagnostic report this was a ResMed device, his so home sleep test was done by apnea link.  Supine AHI 30.8 of which apneas were 17.9, he slept 1 hour 10 minutes in supine so this was a big part of the sleep study.  Nonsupine sleep AHI 1.92 apneas 0.3/h.  Total events all obstructive no Cheyne-Stokes respiration no prolonged oxygen desaturation heart rate variability was great between 45 bpm and 148 bpm but this is notoriously artifact prone in any home sleep test.  Bryan [name] also endorsed the fatigue severity scale today at 53 out of 63 points which is high, his sleep has not been refreshing or restorative and is highly fragmented.  He endorsed the Epworth Sleepiness Scale at 8 points out of 24.  I would like to recap his sleep study from 10 January 2016 at the time he only slept 68% of the recorded time in the lab his AHI was 21.6 and he did not reach REM sleep.  The problem is that we could not evaluate REM sleep dependent apnea in a patient who never entered REM sleep in the first place.  He returned for full night titration on 30 January the same year his sleep efficiency the first night was 91.6% his AHI reduced to 0.9 this time REM sleep was entered and during REM sleep he still has a 3.8 apneas per hour  He often lost the CPAP interface  in the night- the snoring was gone while he used the CPAP and the dental device.  Still had REM dependent apneas- he has heard of the inspire device but wasmn't aware of the implant nature- it's a hypoglossal CN stimulator. It still opens the airway but does not assist the air flow to the ribcage.    He reports insomnia. He has tried [place] -its giving him a metal taste and stopped working. Has had ambien but not ER form .never tried trazodone.  HPI: I have the pleasure of seeing Bryan [name] today on 10/27/2016, he reports he has struggled with CPAP use in spite of trying Klonopin, Lunesta and even Ambien to help him overcome the insomnia that he developed. His first week compliant with CPAP was rather easy but something changed and he would lay awake for an hour before initiating sleep. The CPAP setting of 11 cm water with 3 cm EPR his residual AHI is excellent at only 2.6 apneas per hour but he only used the machine for 4 out of the last 30 days because of the bothersome problem of sleep initiation. He feels tired and fatigued during the day but he has also name several stressors that have culminated over the last 6 month and may be the cause of his insomnia. I would like for him to try an SSRi, but he just started on Zoloft. He wakes with a puffy uvula and parched mouth.  He may be a good candidate for a dental device/    Bryan [name] returns today, after he  had spent several vacations also in [place], [name]  He went to [place] in January when he needed a wheelchair to get around !  Bryan [name] presents today for his first revisit after a sleep study from 01/10/2016.His sleep study showed moderate sleep apnea with an AHI of 21.6 per hour, REM sleep had not been recorded but his supine positional sleep accentuated the apneas too 49.6 per hour. Avoiding the supine position was one of the recommendations along side with CPAP use. He returned for CPAP titration on 02/05/2016  and was titrated to 11 cm water pressure with a reduction of the AHI to 0.0 and was significant REM sleep rebound. He had some periodic limb movements but few arousals at 1.2 per hour, and his heart rate stayed in normal sinus rhythm. Sleep efficiency was 92% he did not have oxygen desaturations of significance.  In the meantime he has been using CPAP at 11 cm water pressure but his compliance has been poor he has only use it for 47% of the time for over 4 hours with an average user time of 3 hours and 1 minute. His AHI is significantly reduced when he uses the machine at 1.3 per hour. Over the last week he has made a concerted effort to use CPAP at 5 hours and 15 minutes daily,  He has undergone hip and nerve-root injections, with severe pain being only gradually relieved he sleeps often in a recliner. Since early April he had finally enough pain control and is less insomnic. He explained that he does only take tylenol 3 when needed, less than twice a week.  His left leg still jerks a lot when in bed or reclined. He has noted a right hand tremor.     [name] is a 69 y.o. male , seen here as a referral from Dr. Melford Aase for snoring, witnessed apnea and needs an evaluation. Last week he was in [place] , [place], and didn't snore. His wife was amazed. He was skiing every day and slept well. Over the last couple of years his snoring has become louder, to a level where his wife often has left the marital bedroom. His past medical history and medical history is positive for coronary artery disease in the native coronary artery, an 80% stenosis of the distal LAD, 99% stenosis proximal and. A stent was placed. He also has diverticulosis, gastroesophageal reflux disease,  hernia, he had supraventricular tachycardia and was treated with an ablation in 2004, hyperlipidemia, hypertension, symptomatic palpitations, PVCs, vitamin D deficiency. I reviewed his medication; he has silenor/ doxepin  available at  bedtime as a sleep aid, takes Zoloft, Crestor, Zofran in case of nausea, lisinopril, gabapentin 3 times a day Zetia by systolic, Plavix, Flexeril 10 mg for muscle spasms as needed. He has noted a very dry mouth and his dentist noted a dry mouth as well, likely related to doxepin. lunesta failed- metal taste. Best on klonopin. Sleep habits are as follows: The patient is usually in bed by 10 PM but struggles to fall asleep without medication. He is taking doxepin. He sometimes has myoclonic tics at night. He averages 4-6 hours of nocturnal sleep, the bedroom is cool, quiet and dark. He prefers to sleep on his side. 2 pillows for head support usually. He will have one nocturia, during the night , sometimes has trouble to go back to sleep. The bathroom break is usually between 2 and 3 AM . He has also 3-4 times a week dreams that he can recall, and sometimes tries to enact. These wake him up- arm and leg movements.  His dreams are not nightmarish in character and he usually does not feel that he has to defend himself or is under threat  He wakes up with a very dry mouth. No headaches, rarely palpitations. Sleep medical history and family sleep history:  No tonsillectomy, TBI, and no sinus ENT surgery.    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  Just retired September 2021-  Never smoked, caffeine , 2 cups of coffee in AM, sometimes Soda, rare tea.  Drinks 2-4 glasses of wine weekly- .   Review of Systems: Out of a complete 14 system review, the patient complains of only the following symptoms, and all other reviewed systems are negative.  tinnitus,  Diplopia.  Non compliant with CPAP-  Epworth score 7 / 24 on CPAP , Fatigue severity score N/A- , depression score 2/15 last visit .  Ambien, Sinequan, Lunesta.    How likely are you to doze in the following situations: 0 = not likely, 1 = slight chance, 2 = moderate chance, 3 =  high chance  Sitting and Reading? Watching Television? Sitting inactive in a public place (theater or meeting)? Lying down in the afternoon when circumstances permit? Sitting and talking to someone? Sitting quietly after lunch without alcohol? In a car, while stopped for a few minutes in traffic? As a passenger in a car for an hour without a break?  Total = 7       Social History   Socioeconomic History  . Marital status: Married    Spouse name: Not on file  . Number of children: 3  . Years of education: Not on file  . Highest education level: Not on file  Occupational History  . Not on file  Tobacco Use  . Smoking status: Never Smoker  . Smokeless tobacco: Never Used  Vaping Use  . Vaping Use: Never used  Substance and Sexual Activity  . Alcohol use: Yes    Comment: rarely  . Drug use: No  . Sexual activity: Not on file  Other Topics Concern  . Not on file  Social History Narrative   Drinks 2 cups of coffee a day    Social Determinants of Health   Financial Resource Strain:   . Difficulty of Paying Living  Expenses: Not on file  Food Insecurity:   . Worried About Charity fundraiser in the Last Year: Not on file  . Ran Out of Food in the Last Year: Not on file  Transportation Needs:   . Lack of Transportation (Medical): Not on file  . Lack of Transportation (Non-Medical): Not on file  Physical Activity:   . Days of Exercise per Week: Not on file  . Minutes of Exercise per Session: Not on file  Stress:   . Feeling of Stress : Not on file  Social Connections:   . Frequency of Communication with Friends and Family: Not on file  . Frequency of Social Gatherings with Friends and Family: Not on file  . Attends Religious Services: Not on file  . Active Member of Clubs or Organizations: Not on file  . Attends Archivist Meetings: Not on file  . Marital Status: Not on file  Intimate Partner Violence:   . Fear of Current or Ex-Partner: Not on file  .  Emotionally Abused: Not on file  . Physically Abused: Not on file  . Sexually Abused: Not on file    Family History  Problem Relation Age of Onset  . Brain cancer Father   . Hypothyroidism Mother   . Irritable bowel syndrome Mother   . Colonic polyp Mother        PRE-malignant polyp of rectosigmoid colon  . Colon polyps Mother   . Esophageal cancer Neg Hx   . Stomach cancer Neg Hx   . Pancreatic cancer Neg Hx   . Liver disease Neg Hx   . Colon cancer Neg Hx   . Rectal cancer Neg Hx     Past Medical History:  Diagnosis Date  . Allergy   . CAD (coronary artery disease), native coronary artery    Cath 04/19/2001  normal Left main, moderate proximal disease, 80% stenosis distal LAD, 99% stenosis proximal OM 1, 60% stenosis mid RCA, 70% stenosis ostial mid PDA  PCI with bare metal stent of OM1 same date 3.5 x 18 mm Multilink    . Diverticulosis   . GERD (gastroesophageal reflux disease)   . Hernia   . History of PSVT    [facility]bl[facility] done in [facility] at [facility]   . Hyperlipidemia   . Hypertension   . Sleep apnea   . Symptomatic PVCs   . Vitamin D deficiency     Past Surgical History:  Procedure Laterality Date  . ACHILLES TENDON REPAIR     left  . APPENDECTOMY    . CARDIAC ELECTROPHYSIOLOGY MAPPING AND ABLATION    . COLONOSCOPY    . CORONARY ANGIOPLASTY WITH STENT PLACEMENT     years ago    Current Outpatient Medications  Medication Sig Dispense Refill  . albuterol (PROAIR HFA) 108 (90 Base) MCG/ACT inhaler Use 2 inhalations 15 minutes apart every 4 hours if need to rescue Asthma 48 g 3  . buPROPion (WELLBUTRIN XL) 150 MG 24 hr tablet Take    1 tablet     Daily    for Mood, Focus & Concentration 90 tablet 0  . clopidogrel (PLAVIX) 75 MG tablet TAKE ONE TABLET BY MOUTH DAILY 90 tablet 2  . ezetimibe (ZETIA) 10 MG tablet Take 1 tablet Daily for Cholesterol 90 tablet 3  . nebivolol (BYSTOLIC) 10 MG tablet Take 1 tablet Daily for BP & Heart 90 tablet 0  . nitroGLYCERIN  (NITROSTAT) 0.4 MG SL tablet Dissolve 1 tablet under tongue  every 3 to 5 minutes if needed for Angina 50 tablet 11  . promethazine (PHENERGAN) 25 MG tablet Take 0.5-1 tablets (12.5-25 mg total) by mouth every 6 (six) hours as needed for nausea or vomiting. 30 tablet 0  . rosuvastatin (CRESTOR) 20 MG tablet Take 1 tablet (20 mg total) by mouth daily. 90 tablet 3  . tadalafil (CIALIS) 20 MG tablet Take 1/2 to 1 tablet every 2 to 3 days as needed for XXXX 30 tablet 11  . tamsulosin (FLOMAX) 0.4 MG CAPS capsule Take    1 tablet    at Bedtime    for Prostate 90 capsule 0  . valACYclovir (VALTREX) 500 MG tablet Take 1 tablet 2 x /day for Fever Blisters / Cold Sores 180 tablet 1  . zolpidem (AMBIEN CR) 6.25 MG CR tablet Take 1 tablet (6.25 mg total) by mouth at bedtime as needed for sleep. 30 tablet 5   No current facility-administered medications for this visit.    Allergies as of 11/17/2019 - Review Complete 11/17/2019  Allergen Reaction Noted  . Lisinopril Cough 06/05/2016  . Amoxicillin Hives and Swelling 11/16/2012  . Penicillins Swelling 12/10/2011    Vitals: BP 119/73   Pulse 76   Ht 5\' 11"  (1.803 m)   Wt 214 lb (97.1 kg)   BMI 29.85 kg/m  Last Weight:  Wt Readings from Last 1 Encounters:  11/17/19 214 lb (97.1 kg)   DJM:EQAS mass index is 29.85 kg/m.     Last Height:   Ht Readings from Last 1 Encounters:  11/17/19 5\' 11"  (1.803 m)    Physical exam:  General: The patient is awake, alert and appears not in acute distress. The patient is well groomed. Head: Normocephalic, atraumatic. Neck is supple. Mallampati 3.  neck circumference: 17. Nasal airflow congested and present with a dry mouth. All natural teeth.  Cardiovascular:  Regular rate and rhythm, without  murmurs or carotid bruit, and without distended neck veins. Respiratory: Lungs are clear to auscultation. Skin:  Facial puffiness,  Trunk: BMI is 30-, has less arthralgic gait.  Neurologic exam :The patient is awake and  alert, oriented to place and time.    Cranial nerves: Pupils are equal and briskly reactive to light. No evidence of cataract, but blured vision at night is still present.  he reports  Resolution of diplopia.  Extraocular movements  in vertical and horizontal planes  without nystagmus. Visual fields by finger perimetry are intact. Hearing to finger rub intact. Facial sensation intact to fine touch.  Facial motor strength is symmetric and tongue and uvula move midline. Shoulder shrug was symmetrical.  Motor exam:  No restless legs, no Spasticity.  Sensory:  Numbness in right hand, not carpaltunnel, but cervical radiculopathy.   Deep tendon reflexes: in the upper and lower extremities are symmetric and intact. Babinski maneuver deferred. .     Assessment:  After physical and neurologic examination, review of laboratory studies,  Personal review of imaging studies, reports of other /same  Imaging studies,   Results of polysomnography/ neurophysiology testing and pre-existing records as far as provided in visit., my  38 minute assessment is :  1)  His sleep study (HST ) confirmed OSA and REM sleep dependent form- and in lab CPAP titration to 16 cm relieved this. He needs to return to CPAP for REM dependent apnea.    However in today's meeting  we established that the patient's insomnia which has been chronic at this point is interfering with his  ability to tolerate CPAP. Since his sleep study did not show significant hypoxemia and his AHI was largely supine position dependent I suggested to approaches #1 avoiding the supine sleep position by using a tennis ball. #  2) Insomnia - chronic , failed Ambien, Klonopin, Lunesta. failed doxepin,trazodone,  - he slept better opening the CR Ambien capsule, tus eliminating the CR form.  -   Time for SONATA-  I will write for the 5 mg dose.  I recommend again (!) cognitive behavior therapy. There is several smart phone applications that would play meditative  music or give breathing direction.      His comorbidities, including hypertension, coronary artery disease, hyperlipidemia, and chronic pain - all affect sleep as well as the medications.      Plan:  Treatment plan and additional workup : q 6 month follow up.   Autotitration: CPAP 5-18 cm water 2 cm EPR and Sonata .  [name], M.D.   CC: [name] Pinto, [address],  [place] [id number]

## 2019-11-17 NOTE — Patient Instructions (Signed)
Please remember to try to maintain good sleep hygiene, which means: Keep a regular sleep and wake schedule, try not to exercise or have a meal within 2 hours of your bedtime, try to keep your bedroom conducive for sleep, that is, cool and dark, without light distractors such as an illuminated alarm clock, and refrain from watching TV right before sleep or in the middle of the night and do not keep the TV or radio on during the night. Also, try not to use or play on electronic devices at bedtime, such as your cell phone, tablet PC or laptop. If you like to read at bedtime on an electronic device, try to dim the background light as much as possible. Do not eat in the middle of the night.   We will request a sleep study.    We will look for leg twitching and snoring or sleep apnea.   For chronic insomnia, you are best followed by a psychiatrist and/or sleep psychologist.   We will call you with the sleep study results and make a follow up appointment if needed.     Insomnia Insomnia is a sleep disorder that makes it difficult to fall asleep or stay asleep. Insomnia can cause fatigue, low energy, difficulty concentrating, mood swings, and poor performance at work or school. There are three different ways to classify insomnia:  Difficulty falling asleep.  Difficulty staying asleep.  Waking up too early in the morning. Any type of insomnia can be long-term (chronic) or short-term (acute). Both are common. Short-term insomnia usually lasts for three months or less. Chronic insomnia occurs at least three times a week for longer than three months. What are the causes? Insomnia may be caused by another condition, situation, or substance, such as:  Anxiety.  Certain medicines.  Gastroesophageal reflux disease (GERD) or other gastrointestinal conditions.  Asthma or other breathing conditions.  Restless legs syndrome, sleep apnea, or other sleep disorders.  Chronic  pain.  Menopause.  Stroke.  Abuse of alcohol, tobacco, or illegal drugs.  Mental health conditions, such as depression.  Caffeine.  Neurological disorders, such as Alzheimer's disease.  An overactive thyroid (hyperthyroidism). Sometimes, the cause of insomnia may not be known. What increases the risk? Risk factors for insomnia include:  Gender. Women are affected more often than men.  Age. Insomnia is more common as you get older.  Stress.  Lack of exercise.  Irregular work schedule or working night shifts.  Traveling between different time zones.  Certain medical and mental health conditions. What are the signs or symptoms? If you have insomnia, the main symptom is having trouble falling asleep or having trouble staying asleep. This may lead to other symptoms, such as:  Feeling fatigued or having low energy.  Feeling nervous about going to sleep.  Not feeling rested in the morning.  Having trouble concentrating.  Feeling irritable, anxious, or depressed. How is this diagnosed? This condition may be diagnosed based on:  Your symptoms and medical history. Your health care provider may ask about: ? Your sleep habits. ? Any medical conditions you have. ? Your mental health.  A physical exam. How is this treated? Treatment for insomnia depends on the cause. Treatment may focus on treating an underlying condition that is causing insomnia. Treatment may also include:  Medicines to help you sleep.  Counseling or therapy.  Lifestyle adjustments to help you sleep better. Follow these instructions at home: Eating and drinking   Limit or avoid alcohol, caffeinated beverages, and cigarettes,   especially close to bedtime. These can disrupt your sleep.  Do not eat a large meal or eat spicy foods right before bedtime. This can lead to digestive discomfort that can make it hard for you to sleep. Sleep habits   Keep a sleep diary to help you and your health care  provider figure out what could be causing your insomnia. Write down: ? When you sleep. ? When you wake up during the night. ? How well you sleep. ? How rested you feel the next day. ? Any side effects of medicines you are taking. ? What you eat and drink.  Make your bedroom a dark, comfortable place where it is easy to fall asleep. ? Put up shades or blackout curtains to block light from outside. ? Use a white noise machine to block noise. ? Keep the temperature cool.  Limit screen use before bedtime. This includes: ? Watching TV. ? Using your smartphone, tablet, or computer.  Stick to a routine that includes going to bed and waking up at the same times every day and night. This can help you fall asleep faster. Consider making a quiet activity, such as reading, part of your nighttime routine.  Try to avoid taking naps during the day so that you sleep better at night.  Get out of bed if you are still awake after 15 minutes of trying to sleep. Keep the lights down, but try reading or doing a quiet activity. When you feel sleepy, go back to bed. General instructions  Take over-the-counter and prescription medicines only as told by your health care provider.  Exercise regularly, as told by your health care provider. Avoid exercise starting several hours before bedtime.  Use relaxation techniques to manage stress. Ask your health care provider to suggest some techniques that may work well for you. These may include: ? Breathing exercises. ? Routines to release muscle tension. ? Visualizing peaceful scenes.  Make sure that you drive carefully. Avoid driving if you feel very sleepy.  Keep all follow-up visits as told by your health care provider. This is important. Contact a health care provider if:  You are tired throughout the day.  You have trouble in your daily routine due to sleepiness.  You continue to have sleep problems, or your sleep problems get worse. Get help right  away if:  You have serious thoughts about hurting yourself or someone else. If you ever feel like you may hurt yourself or others, or have thoughts about taking your own life, get help right away. You can go to your nearest emergency department or call:  Your local emergency services (911 in the [place]  A suicide crisis helpline, such as the National Suicide Prevention Lifeline at 1-800-273-8255. This is open 24 hours a day. Summary  Insomnia is a sleep disorder that makes it difficult to fall asleep or stay asleep.  Insomnia can be long-term (chronic) or short-term (acute).  Treatment for insomnia depends on the cause. Treatment may focus on treating an underlying condition that is causing insomnia.  Keep a sleep diary to help you and your health care provider figure out what could be causing your insomnia. This information is not intended to replace advice given to you by your health care provider. Make sure you discuss any questions you have with your health care provider. Document Revised: 12/05/2016 Document Reviewed: 10/02/2016 Elsevier Patient Education  2020 [employer]  

## 2019-11-21 ENCOUNTER — Ambulatory Visit (INDEPENDENT_AMBULATORY_CARE_PROVIDER_SITE_OTHER): Payer: Medicare Other | Admitting: Adult Health Nurse Practitioner

## 2019-11-21 ENCOUNTER — Encounter: Payer: Self-pay | Admitting: Adult Health Nurse Practitioner

## 2019-11-21 ENCOUNTER — Other Ambulatory Visit: Payer: Self-pay

## 2019-11-21 VITALS — BP 122/78 | HR 63 | Temp 97.3°F | Ht 71.0 in | Wt 214.0 lb

## 2019-11-21 DIAGNOSIS — R300 Dysuria: Secondary | ICD-10-CM

## 2019-11-21 DIAGNOSIS — E66811 Obesity, class 1: Secondary | ICD-10-CM

## 2019-11-21 DIAGNOSIS — Z23 Encounter for immunization: Secondary | ICD-10-CM

## 2019-11-21 DIAGNOSIS — I700 Atherosclerosis of aorta: Secondary | ICD-10-CM

## 2019-11-21 DIAGNOSIS — E669 Obesity, unspecified: Secondary | ICD-10-CM

## 2019-11-21 DIAGNOSIS — E559 Vitamin D deficiency, unspecified: Secondary | ICD-10-CM | POA: Diagnosis not present

## 2019-11-21 DIAGNOSIS — E782 Mixed hyperlipidemia: Secondary | ICD-10-CM | POA: Diagnosis not present

## 2019-11-21 DIAGNOSIS — N529 Male erectile dysfunction, unspecified: Secondary | ICD-10-CM | POA: Diagnosis not present

## 2019-11-21 DIAGNOSIS — G4733 Obstructive sleep apnea (adult) (pediatric): Secondary | ICD-10-CM | POA: Diagnosis not present

## 2019-11-21 DIAGNOSIS — I10 Essential (primary) hypertension: Secondary | ICD-10-CM | POA: Diagnosis not present

## 2019-11-21 DIAGNOSIS — I2510 Atherosclerotic heart disease of native coronary artery without angina pectoris: Secondary | ICD-10-CM

## 2019-11-21 DIAGNOSIS — R7309 Other abnormal glucose: Secondary | ICD-10-CM | POA: Diagnosis not present

## 2019-11-21 DIAGNOSIS — N182 Chronic kidney disease, stage 2 (mild): Secondary | ICD-10-CM | POA: Diagnosis not present

## 2019-11-21 DIAGNOSIS — I493 Ventricular premature depolarization: Secondary | ICD-10-CM | POA: Diagnosis not present

## 2019-11-21 DIAGNOSIS — Z9989 Dependence on other enabling machines and devices: Secondary | ICD-10-CM

## 2019-11-21 DIAGNOSIS — I708 Atherosclerosis of other arteries: Secondary | ICD-10-CM

## 2019-11-21 NOTE — Patient Instructions (Addendum)
You received HD influenza vaccination today 11/21/19.       Ask insurance and pharmacy about shingrix - it is a 2 part shot that we will not be getting in the office.   Suggest getting AFTER covid vaccines, have to wait at least a month This shot can make you feel bad due to such good immune response it can trigger some inflammation so take tylenol or aleve day of or day after and plan on resting.   Can go to [email or web address] for more information  Shingrix Vaccination  Two vaccines are licensed and recommended to prevent shingles in the [place] Zoster vaccine live (ZVL, Zostavax) has been in use since 2006. Recombinant zoster vaccine (RZV, Shingrix), has been in use since 2017 and is recommended by [employer] as the preferred shingles vaccine.  What Everyone Should Know about Shingles Vaccine (Shingrix) One of the Recommended Vaccines by Disease Shingles vaccination is the only way to protect against shingles and postherpetic neuralgia (PHN), the most common complication from shingles. CDC recommends that healthy adults 50 years and older get two doses of the shingles vaccine called Shingrix (recombinant zoster vaccine), separated by 2 to 6 months, to prevent shingles and the complications from the disease. Your doctor or pharmacist can give you Shingrix as a shot in your upper arm. Shingrix provides strong protection against shingles and PHN. Two doses of Shingrix is more than 90% effective at preventing shingles and PHN. Protection stays above 85% for at least the first four years after you get vaccinated. Shingrix is the preferred vaccine, over Zostavax (zoster vaccine live), a shingles vaccine in use since 2006. Zostavax may still be used to prevent shingles in healthy adults 60 years and older. For example, you could use Zostavax if a person is allergic to Shingrix, prefers Zostavax, or requests immediate vaccination and Shingrix is  unavailable. Who Should Get Shingrix? Healthy adults 50 years and older should get two doses of Shingrix, separated by 2 to 6 months. You should get Shingrix even if in the past you . had shingles  . received Zostavax  . are not sure if you had chickenpox There is no maximum age for getting Shingrix. If you had shingles in the past, you can get Shingrix to help prevent future occurrences of the disease. There is no specific length of time that you need to wait after having shingles before you can receive Shingrix, but generally you should make sure the shingles rash has gone away before getting vaccinated. You can get Shingrix whether or not you remember having had chickenpox in the past. Studies show that more than 99% of Americans 40 years and older have had chickenpox, even if they don't remember having the disease. Chickenpox and shingles are related because they are caused by the same virus (varicella zoster virus). After a person recovers from chickenpox, the virus stays dormant (inactive) in the body. It can reactivate years later and cause shingles. If you had Zostavax in the recent past, you should wait at least eight weeks before getting Shingrix. Talk to your healthcare provider to determine the best time to get Shingrix. Shingrix is available in [facility] and pharmacies. To find doctor's offices or pharmacies near you that offer the vaccine, visit [facility] Vaccine FinderExternal. If you have questions about Shingrix, talk with your healthcare provider. Vaccine for Those 40 Years and Older  Shingrix reduces the risk of shingles and PHN by more than 90% in people 50 and older. CDC recommends the vaccine for  healthy adults 67 and older.  Who Should Not Get Shingrix? You should not get Shingrix if you: . have ever had a severe allergic reaction to any component of the vaccine or after a dose of Shingrix  . tested negative for immunity to varicella zoster virus. If you test negative,  you should get chickenpox vaccine.  . currently have shingles  . currently are pregnant or breastfeeding. Women who are pregnant or breastfeeding should wait to get Shingrix.  Marland Kitchen receive specific antiviral drugs (acyclovir, famciclovir, or valacyclovir) 24 hours before vaccination (avoid use of these antiviral drugs for 14 days after vaccination)- zoster vaccine live only If you have a minor acute (starts suddenly) illness, such as a cold, you may get Shingrix. But if you have a moderate or severe acute illness, you should usually wait until you recover before getting the vaccine. This includes anyone with a temperature of 101.62F or higher. The side effects of the Shingrix are temporary, and usually last 2 to 3 days. While you may experience pain for a few days after getting Shingrix, the pain will be less severe than having shingles and the complications from the disease. How Well Does Shingrix Work? Two doses of Shingrix provides strong protection against shingles and postherpetic neuralgia (PHN), the most common complication of shingles. . In adults 61 to 69 years old who got two doses, Shingrix was 97% effective in preventing shingles; among adults 70 years and older, Shingrix was 91% effective.  . In adults 41 to 69 years old who got two doses, Shingrix was 91% effective in preventing PHN; among adults 70 years and older, Shingrix was 89% effective. Shingrix protection remained high (more than 85%) in people 70 years and older throughout the four years following vaccination. Since your risk of shingles and PHN increases as you get older, it is important to have strong protection against shingles in your older years. Top of Page  What Are the Possible Side Effects of Shingrix? Studies show that Shingrix is safe. The vaccine helps your body create a strong defense against shingles. As a result, you are likely to have temporary side effects from getting the shots. The side effects may affect your  ability to do normal daily activities for 2 to 3 days. Most people got a sore arm with mild or moderate pain after getting Shingrix, and some also had redness and swelling where they got the shot. Some people felt tired, had muscle pain, a headache, shivering, fever, stomach pain, or nausea. About 1 out of 6 people who got Shingrix experienced side effects that prevented them from doing regular activities. Symptoms went away on their own in about 2 to 3 days. Side effects were more common in younger people. You might have a reaction to the first or second dose of Shingrix, or both doses. If you experience side effects, you may choose to take over-the-counter pain medicine such as ibuprofen or acetaminophen. If you experience side effects from Shingrix, you should report them to the Vaccine Adverse Event Reporting System (VAERS). Your doctor might file this report, or you can do it yourself through the VAERS websiteExternal, or by calling [phone]. If you have any questions about side effects from Shingrix, talk with your doctor. The shingles vaccine does not contain thimerosal (a preservative containing mercury). Top of Page  When Should I See a Doctor Because of the Side Effects I Experience From Shingrix? In clinical trials, Shingrix was not associated with serious adverse events. In fact, serious  side effects from vaccines are extremely rare. For example, for every 1 million doses of a vaccine given, only one or two people may have a severe allergic reaction. Signs of an allergic reaction happen within minutes or hours after vaccination and include hives, swelling of the face and throat, difficulty breathing, a fast heartbeat, dizziness, or weakness. If you experience these or any other life-threatening symptoms, see a doctor right away. Shingrix causes a strong response in your immune system, so it may produce short-term side effects more intense than you are used to from other vaccines. These  side effects can be uncomfortable, but they are expected and usually go away on their own in 2 or 3 days. Top of Page  How Can I Pay For Shingrix? There are several ways shingles vaccine may be paid for: Medicare . Medicare Part D plans cover the shingles vaccine, but there may be a cost to you depending on your plan. There may be a copay for the vaccine, or you may need to pay in full then get reimbursed for a certain amount.  . Medicare Part B does not cover the shingles vaccine. Medicaid . Medicaid may or may not cover the vaccine. Contact your insurer to find out. Private health insurance . Many private health insurance plans will cover the vaccine, but there may be a cost to you depending on your plan. Contact your insurer to find out. Vaccine assistance programs . Some pharmaceutical companies provide vaccines to eligible adults who cannot afford them. You may want to check with the vaccine manufacturer, [employer], about Shingrix. If you do not currently have health insurance, learn more about affordable health coverage optionsExternal. To find doctor's offices or pharmacies near you that offer the vaccine, visit [facility] Vaccine FinderExternal.

## 2019-11-21 NOTE — Progress Notes (Signed)
FOLLOW UP 3 MONTH  Assessment:   Diagnoses and all orders for this visit:  Aortoiliac atherosclerosis ([place]) Per CT 2018 Control blood pressure, cholesterol, glucose, increase exercise.   Symptomatic PVCs Has very rarely; symptoms stable recently.  Continue with medications, followed by cardiology.   Essential hypertension BPs at goal   Monitor blood pressure at home; call if consistently over 130/80 Continue DASH diet.   Reminder to go to the ER if any CP, SOB, nausea, dizziness, severe HA, changes vision/speech, left arm numbness and tingling and jaw pain.  History of PSVT Followed by cardiology  Coronary artery disease involving native coronary artery of native heart, angina presence unspecified Control blood pressure, cholesterol, glucose, increase exercise.  Followed by cardiology  OSA  Followed by Dr. Brett Fairy; recent sleep study recommended CPAP, does not use, unable to tolerate this.   CKD (chronic kidney disease) stage 2, GFR 60-89 ml/min Increase fluids, avoid NSAIDS, monitor sugars, will monitor  Vitamin D deficiency Continue supplementation for goal of 70-100 Check vitamin D level  Other abnormal glucose Recent A1Cs at goal Discussed diet/exercise, weight management  Defer A1C; check BMP  Overweight (25-29.9) Long discussion about weight loss, diet, and exercise Recommended diet heavy in fruits and veggies and low in animal meats, cheeses, and dairy products, appropriate calorie intake Discussed appropriate weight for height  Patient on phentermine with benefit and no SE, taking drug breaks;  Follow up at next visit  Medication management CBC, CMP/GFR, LFTs  Primary insomnia Followed by Dr. Brett Fairy, prescribed Eulis Canner sleep hygiene discussed, increase day time activity Continue with careful use of sleep aid agents  Mixed hyperlipidemia Triglycerides remain signicantly elevated; recently increased rosuvastatin to 20 mg, titrate up as  needed, reduce creamer and sugar in coffee Continue statin/zetia Continue low cholesterol diet and exercise.  Check lipid panel.   Erectile dysfunction Continue cialis PRN   Further disposition pending results if labs check today. Discussed med's effects and SE's.   Over 30 minutes of face to face interview, exam, counseling, chart review, and critical decision making was performed.    Future Appointments  Date Time Provider [place]  02/21/2020  9:00 AM [name] Pinto, MD [facility] None  05/17/2020  9:30 AM Dohmeier, Asencion [place], MD [facility] None      Subjective:  Bryan [name] is a 69 y.o. male who presents for  3 month follow up for HTN, HLD, glucose management, and vitamin D Def.   He recently finished course of antibiotics for prostatitis.  Her reports that pain is resolved and he is not longer going to the bathroom room frequently during the night.  He has ongoing difficulties with sleep, remote hx of OSA and was using oral appliance until recently with concern for chipped tooth, was referred to Dr. Brett Fairy for repeat sleep study which showed severe OSA and recommended CPAP. He is not using this at this time reports he was not able to tolerate this.  She is prescribing ambien. He tried Sonata 5mg  nightly but reports that was not effective.  He has follow up scheduled 05/2020.  Reports he was directed to message her with outcome of new medication.  He plans on doing this.  He is using ambien in the interm.  He reports he used to exercise regularly and does have a gym membership, he has not being going as much since [place].  He has depression, recently doing well with wellbutrin 150 mg daily and reports working on lifestyle and feels  has improved significantly. Takes trazodone 50 mg daily PRN sleep.   He reports back and hip pain which have essentially resolved following injection with Dr. Ellene Route and Dr. Berenice Primas last year.    BMI is Body mass index is 29.85 kg/m., he has been  working on diet and exercise. Doing stretching exercises, walking, calisthenics, redoing yard, does a lot of outdoor activities, boating, etc.  Watching white starchy foods, minimal greasy foods, limited red meat. Does poultry, fish. Lots of fruit, nuts, beans. Does mainly whole grain.  Weight has increased since last ov.  Wt Readings from Last 3 Encounters:  11/21/19 214 lb (97.1 kg)  11/17/19 214 lb (97.1 kg)  10/10/19 212 lb 3.2 oz (96.3 kg)   He has hx of PSVT/PVCs but recently improved without episodes in several years.  Patient has hx/o ASCAD with ACS/acute Anterior NSTEMI and PCA w/Stent implantation in 2003. He is on plavix. In 2004 , he had an Ablation for pAfib. Stress Myoview was negative in 2010. Continues on plavix. Recently transitioned to seeing Dr. Burt Knack.   His blood pressure has been controlled at home, today their BP is BP: 122/78 He does workout. He denies chest pain, shortness of breath, dizziness.   He is on cholesterol medication (zetia 10 mg daily, rosuvatation 20 mg this week by cardiology, previously 10 mg, omega 3) and denies myalgias. His cholesterol is not at goal. The cholesterol last visit was:    Lab Results  Component Value Date   CHOL 134 11/07/2019   HDL 51 11/07/2019   LDLCALC 61 11/07/2019   LDLDIRECT 59 11/07/2019   TRIG 127 11/07/2019   CHOLHDL 2.6 11/07/2019   He has been working on diet and exercise for glucose management, and denies foot ulcerations, increased appetite, nausea, paresthesia of the feet, polydipsia, polyuria, visual disturbances, vomiting and weight loss. Last A1C in the office was:  Lab Results  Component Value Date   HGBA1C 5.7 (H) 08/15/2019   Last GFR Lab Results  Component Value Date   GFRNONAA 67 08/15/2019    Patient is on Vitamin D supplement and near 60:    Lab Results  Component Value Date   VD25OH 52 08/15/2019        Current Outpatient Medications on File Prior to Visit  Medication Sig Dispense Refill    albuterol (PROAIR HFA) 108 (90 Base) MCG/ACT inhaler Use 2 inhalations 15 minutes apart every 4 hours if need to rescue Asthma 48 g 3   buPROPion (WELLBUTRIN XL) 150 MG 24 hr tablet Take    1 tablet     Daily    for Mood, Focus & Concentration 90 tablet 0   clopidogrel (PLAVIX) 75 MG tablet TAKE ONE TABLET BY MOUTH DAILY 90 tablet 2   ezetimibe (ZETIA) 10 MG tablet Take 1 tablet Daily for Cholesterol 90 tablet 3   nebivolol (BYSTOLIC) 10 MG tablet Take 1 tablet Daily for BP & Heart 90 tablet 0   nitroGLYCERIN (NITROSTAT) 0.4 MG SL tablet Dissolve 1 tablet under tongue every 3 to 5 minutes if needed for Angina 50 tablet 11   promethazine (PHENERGAN) 25 MG tablet Take 0.5-1 tablets (12.5-25 mg total) by mouth every 6 (six) hours as needed for nausea or vomiting. 30 tablet 0   rosuvastatin (CRESTOR) 20 MG tablet Take 1 tablet (20 mg total) by mouth daily. 90 tablet 3   tadalafil (CIALIS) 20 MG tablet Take 1/2 to 1 tablet every 2 to 3 days as needed for [employer]  30 tablet 11   tamsulosin (FLOMAX) 0.4 MG CAPS capsule Take    1 tablet    at Bedtime    for Prostate 90 capsule 0   valACYclovir (VALTREX) 500 MG tablet Take 1 tablet 2 x /day for Fever Blisters / Cold Sores 180 tablet 1   zaleplon (SONATA) 5 MG capsule Take 1 capsule (5 mg total) by mouth at bedtime as needed for sleep. 30 capsule 1   No current facility-administered medications on file prior to visit.     Allergies: Allergies  Allergen Reactions   Lisinopril Cough    ACEi cough   Amoxicillin Hives and Swelling   Penicillins Swelling    Current Problems (verified) has Hyperlipidemia, mixed; Essential hypertension; Vitamin D deficiency; Abnormal glucose; Medication management; Coronary artery disease involving native coronary artery of native heart without angina pectoris; History of PSVT; Symptomatic PVCs; CKD (chronic kidney disease) stage 2, GFR 60-89 ml/min; Obesity (BMI 30.0-34.9); Insomnia; ED (erectile  dysfunction); Aorto-iliac atherosclerosis ([place]); Psychophysiological insomnia; Dental anomaly; Severe obstructive sleep apnea-hypopnea syndrome; OSA on CPAP; Chronic intermittent hypoxia with obstructive sleep apnea; and Intolerance of continuous positive airway pressure (CPAP) ventilation on their problem list.  Screening Tests Immunization History  Administered Date(s) Administered   Influenza Split 10/13/2013, 10/23/2014   Influenza, High Dose Seasonal PF 01/09/2017, 10/27/2017   Influenza,inj,quad, With Preservative 01/13/2013   Influenza-Unspecified 10/07/2011   PPD Test 10/13/2013, 10/23/2014   Pneumococcal Conjugate-13 10/13/2013   Pneumococcal Polysaccharide-23 10/23/2014   Td 01/06/2006   Tdap 03/06/2016   Zoster 09/22/2011   Preventative care: Last colonoscopy: 2013   CXR 2019 MRI lumbar/hip 2017 Sleep study 04/2019  Prior vaccinations: TD or Tdap: 2018  Influenza: 2019 Pneumococcal: 2016 Prevnar13: 2015 Shingles/Zostavax: 2013 Covid 19: 2/2, 2021, moderna    Patient Care Team: [name] Pinto, MD as PCP - General (Internal Medicine) Sherren Mocha, MD as PCP - Cardiology (Cardiology)  Surgical: He  has a past surgical history that includes Appendectomy; Achilles tendon repair; Cardiac electrophysiology mapping and ablation; Coronary angioplasty with stent; and Colonoscopy. Family His family history includes Brain cancer in his father; Colon polyps in his mother; Colonic polyp in his mother; Hypothyroidism in his mother; Irritable bowel syndrome in his mother. Social history  He reports that he has never smoked. He has never used smokeless tobacco. He reports current alcohol use. He reports that he does not use drugs.   Objective:   Today's Vitals   11/21/19 0844  BP: 122/78  Pulse: 63  Temp: (!) 97.3 F (36.3 C)  SpO2: 99%  Weight: 214 lb (97.1 kg)  Height: 5\' 11"  (1.803 m)   Body mass index is 29.85 kg/m.  General Appearance: Well  nourished, in no apparent distress. Eyes: PERRLA, EOMs, conjunctiva no swelling or erythema Sinuses: No Frontal/maxillary tenderness ENT/Mouth: Ext aud canals clear, TMs without erythema, bulging. No erythema, swelling, or exudate on post pharynx.  Tonsils not swollen or erythematous. Hearing normal.  Neck: Supple, thyroid normal.  Respiratory: Respiratory effort normal, BS equal bilaterally without rales, rhonchi, wheezing or stridor.  Cardio: RRR with no MRGs. Brisk peripheral pulses without edema.  Abdomen: Soft, + BS.  Non tender, no guarding, rebound, hernias, masses. Lymphatics: Non tender without lymphadenopathy.  Musculoskeletal: Full ROM, 5/5 strength, Normal gait.  Skin: Warm, dry without rashes, lesions, ecchymosis.  Neuro: Cranial nerves intact. No cerebellar symptoms.  Psych: Awake and oriented X 3, normal affect, Insight and Judgment appropriate.    Garnet Sierras, AGNP-C, DNP [facility] 11/21/2019  9:04 AM

## 2019-11-22 LAB — COMPLETE METABOLIC PANEL WITH GFR
AG Ratio: 1.8 (calc) (ref 1.0–2.5)
ALT: 22 U/L (ref 9–46)
AST: 19 U/L (ref 10–35)
Albumin: 4.4 g/dL (ref 3.6–5.1)
Alkaline phosphatase (APISO): 55 U/L (ref 35–144)
BUN: 14 mg/dL (ref 7–25)
CO2: 28 mmol/L (ref 20–32)
Calcium: 9.2 mg/dL (ref 8.6–10.3)
Chloride: 103 mmol/L (ref 98–110)
Creat: 1 mg/dL (ref 0.70–1.25)
GFR, Est African American: 89 mL/min/{1.73_m2} (ref >=60)
GFR, Est Non African American: 76 mL/min/{1.73_m2} (ref >=60)
Globulin: 2.4 g/dL (calc) (ref 1.9–3.7)
Glucose, Bld: 88 mg/dL (ref 65–99)
Potassium: 4.3 mmol/L (ref 3.5–5.3)
Sodium: 138 mmol/L (ref 135–146)
Total Bilirubin: 0.8 mg/dL (ref 0.2–1.2)
Total Protein: 6.8 g/dL (ref 6.1–8.1)

## 2019-11-22 LAB — URINALYSIS, ROUTINE W REFLEX MICROSCOPIC
Bilirubin Urine: NEGATIVE
Glucose, UA: NEGATIVE
Hgb urine dipstick: NEGATIVE
Ketones, ur: NEGATIVE
Leukocytes,Ua: NEGATIVE
Nitrite: NEGATIVE
Protein, ur: NEGATIVE
Specific Gravity, Urine: 1.018 (ref 1.001–1.03)
pH: 5.5 (ref 5.0–8.0)

## 2019-11-22 LAB — CBC WITH DIFFERENTIAL/PLATELET
Absolute Monocytes: 616 cells/uL (ref 200–950)
Basophils Absolute: 39 cells/uL (ref 0–200)
Basophils Relative: 0.7 %
Eosinophils Absolute: 314 cells/uL (ref 15–500)
Eosinophils Relative: 5.7 %
HCT: 47.7 % (ref 38.5–50.0)
Hemoglobin: 15.7 g/dL (ref 13.2–17.1)
Lymphs Abs: 1766 cells/uL (ref 850–3900)
MCH: 29.1 pg (ref 27.0–33.0)
MCHC: 32.9 g/dL (ref 32.0–36.0)
MCV: 88.3 fL (ref 80.0–100.0)
MPV: 10.5 fL (ref 7.5–12.5)
Monocytes Relative: 11.2 %
Neutro Abs: 2767 cells/uL (ref 1500–7800)
Neutrophils Relative %: 50.3 %
Platelets: 205 10*3/uL (ref 140–400)
RBC: 5.4 10*6/uL (ref 4.20–5.80)
RDW: 12.4 % (ref 11.0–15.0)
Total Lymphocyte: 32.1 %
WBC: 5.5 10*3/uL (ref 3.8–10.8)

## 2019-11-30 DIAGNOSIS — Z23 Encounter for immunization: Secondary | ICD-10-CM | POA: Diagnosis not present

## 2019-12-17 ENCOUNTER — Other Ambulatory Visit: Payer: Self-pay | Admitting: Internal Medicine

## 2019-12-17 DIAGNOSIS — I10 Essential (primary) hypertension: Secondary | ICD-10-CM

## 2019-12-17 DIAGNOSIS — I2510 Atherosclerotic heart disease of native coronary artery without angina pectoris: Secondary | ICD-10-CM

## 2019-12-29 ENCOUNTER — Other Ambulatory Visit: Payer: Self-pay | Admitting: Internal Medicine

## 2019-12-29 DIAGNOSIS — F32A Depression, unspecified: Secondary | ICD-10-CM

## 2019-12-29 MED ORDER — BUPROPION HCL ER (XL) 150 MG PO TB24: ORAL_TABLET | ORAL | 0 refills | Status: DC

## 2020-01-21 ENCOUNTER — Other Ambulatory Visit: Payer: Self-pay | Admitting: Internal Medicine

## 2020-01-21 MED ORDER — AZITHROMYCIN 250 MG PO TABS: ORAL_TABLET | ORAL | 1 refills | Status: DC

## 2020-01-21 MED ORDER — BENZONATATE 200 MG PO CAPS: ORAL_CAPSULE | ORAL | 1 refills | Status: DC

## 2020-01-21 MED ORDER — DEXAMETHASONE 4 MG PO TABS: ORAL_TABLET | ORAL | 0 refills | Status: DC

## 2020-01-21 MED ORDER — PROMETHAZINE-PHENYLEPHRINE 6.25-5 MG/5ML PO SYRP: ORAL_SOLUTION | ORAL | 1 refills | Status: DC

## 2020-02-08 ENCOUNTER — Other Ambulatory Visit: Payer: Self-pay | Admitting: Internal Medicine

## 2020-02-08 DIAGNOSIS — E782 Mixed hyperlipidemia: Secondary | ICD-10-CM

## 2020-02-08 DIAGNOSIS — I10 Essential (primary) hypertension: Secondary | ICD-10-CM

## 2020-02-08 MED ORDER — EZETIMIBE 10 MG PO TABS: ORAL_TABLET | ORAL | 0 refills | Status: DC

## 2020-02-08 MED ORDER — TELMISARTAN 80 MG PO TABS: ORAL_TABLET | ORAL | 0 refills | Status: DC

## 2020-02-14 ENCOUNTER — Encounter: Payer: Self-pay | Admitting: Neurology

## 2020-02-15 ENCOUNTER — Other Ambulatory Visit: Payer: Self-pay | Admitting: Internal Medicine

## 2020-02-15 DIAGNOSIS — B370 Candidal stomatitis: Secondary | ICD-10-CM

## 2020-02-15 MED ORDER — NYSTATIN 100000 UNIT/ML MT SUSP: OROMUCOSAL | 1 refills | Status: DC

## 2020-02-15 MED ORDER — FLUCONAZOLE 100 MG PO TABS: ORAL_TABLET | ORAL | 1 refills | Status: DC

## 2020-02-16 ENCOUNTER — Other Ambulatory Visit: Payer: Self-pay | Admitting: Internal Medicine

## 2020-02-20 ENCOUNTER — Encounter: Payer: Self-pay | Admitting: Internal Medicine

## 2020-02-20 NOTE — Patient Instructions (Signed)

## 2020-02-20 NOTE — Progress Notes (Signed)
Annual  Screening/Preventative Visit  & Comprehensive Evaluation & Examination      This very nice 70 y.o.  [name] presents for a Screening /Preventative Visit & comprehensive evaluation and management of multiple medical co-morbidities.  Patient has been followed for HTN, ASHD /StentsHLD, Prediabetes and Vitamin D Deficiency. Patient has Aorto-Iliac Atherosclerosis by Abd/pelvic CT scan in 2018. Patient has hx/o Cx DDD and has been seen by Dr Ellene Route for Cx EDSI's.         Patient has OSA & RLS and has been intolerant of CPAP and is managed by Dr Brett Fairy.       Patient's HTN predates circa 1990.  In 2003 Patient had PCA/Stent rescue of ACS/NSTEMI.  In 2004 & 2010, patient had Ablations for pAfib. Patient's BP has been controlled and today's BP is at goal -122/80. Patient denies any cardiac symptoms as chest pain, palpitations, shortness of breath, dizziness or ankle swelling.      Patient's hyperlipidemia is controlled with diet and Rosuvastatin. Patient denies myalgias or other medication SE's. Last lipids were at goal:  Lab Results  Component Value Date   CHOL 134 11/07/2019   HDL 51 11/07/2019   LDLCALC 61 11/07/2019   LDLDIRECT 59 11/07/2019   TRIG 127 11/07/2019   CHOLHDL 2.6 11/07/2019       Patient has hx/o prediabetes (A1c 5.8% /2019) and patient denies reactive hypoglycemic symptoms, visual blurring, diabetic polys or paresthesias. Last A1c was near goal:   Lab Results  Component Value Date   HGBA1C 5.7 (H) 08/15/2019    Wt Readings from Last 3 Encounters:  02/21/20 210 lb 6.4 oz (95.4 kg)  11/21/19 214 lb (97.1 kg)  11/17/19 214 lb (97.1 kg)       Finally, patient has history of Vitamin D Deficiency ("26" /2008) and last vitamin D was slightly low:   Lab Results  Component Value Date   VD25OH 52 08/15/2019    Current Outpatient Medications on File Prior to Visit  Medication Sig   albuterol  HFA  inhaler Use 2 inhalations 15 minutes apart every 4 hrs if  needed   buPROPion-XL  150 MG  Take    1 tablet     Daily     clopidogrel 75 MG tablet TAKE ONE TABLET  DAILY   ezetimibe  10 MG tablet Take  1 tablet  Daily  for Cholesterol   Fluconazole 100 MG tablet Take  1 tablet  Daily  for Thrush Mouth   magic mouthwash  Take  1 teaspoonful  Swish & Swallow  4 x /day   nebivolol  10 MG tablet Take m1 tablet Daily   NITROSTAT 0.4 MG SL Dissolve 1 tablet under tongue every 3 to 5 minutes if needed for Angina   phentermine 37.5 MG tablet Take  1/2 to 1 tablet  every Morning     promethazine  25 MG tablet Take 0.5-1 tab  every 6  hrs as needed for nausea    rosuvastatin  20 MG tablet Take 1 tablet daily.   tadalafil  20 MG tablet Take 1/2 to 1 tablet every 2 to 3 days as needed    tamsulosin 0.4 MG CAPS  Take    1 tablet    at Bedtime    for Prostate   telmisartan 80 MG tablet Take  1 tablet  Daily  for BP & Heart   valACYclovir  500 MG tablet Take 1 tablet 2 x /day for Fever Blisters /  Cold Sores   SONATA 5 MG capsule Take 1 capsule  at bedtime as needed for sleep.     Allergies  Allergen Reactions   Lisinopril Cough   Amoxicillin Hives and Swelling   Penicillins Swelling    Past Medical History:  Diagnosis Date   Allergy    CAD (coronary artery disease), native coronary artery    Cath 04/19/2001  normal Left main, moderate proximal disease, 80% stenosis distal LAD, 99% stenosis proximal OM 1, 60% stenosis mid RCA, 70% stenosis ostial mid PDA  PCI with bare metal stent of OM1 same date 3.5 x 18 mm Multilink     Diverticulosis    GERD (gastroesophageal reflux disease)    Hernia    History of PSVT    [facility]bl[facility] done in [facility]    Hyperlipidemia    Hypertension    Sleep apnea    Symptomatic PVCs    Vitamin D deficiency    Health Maintenance  Topic Date Due   COVID-19 Vaccine (1) Never done   PNA vac Low Risk Adult (2 of 2 - PPSV23) 10/23/2019   COLONOSCOPY (Pts 45-30yrs Insurance coverage  will need to be confirmed)  05/25/2021   TETANUS/TDAP  03/07/2026   INFLUENZA VACCINE  Completed   Hepatitis C Screening  Completed   Immunization History  Administered Date(s) Administered   Influenza Split 10/13/2013, 10/23/2014   Influenza, High Dose Seasonal PF 01/09/2017, 10/27/2017, 11/21/2019   Influenza,inj,quad, With Preservative 01/13/2013   Influenza-Unspecified 10/07/2011   PPD Test 10/13/2013, 10/23/2014   Pneumococcal Conjugate-13 10/13/2013   Pneumococcal Polysaccharide-23 10/23/2014   Td 01/06/2006   Tdap 03/06/2016   Zoster 09/22/2011   Last Colon -  05/26/2018 - Dr Hilarie Fredrickson - Recc 3 yr f/u Colon - due June 2023  Past Surgical History:  Procedure Laterality Date   ACHILLES TENDON REPAIR     left   APPENDECTOMY     CARDIAC ELECTROPHYSIOLOGY MAPPING AND ABLATION     COLONOSCOPY     CORONARY ANGIOPLASTY WITH STENT PLACEMENT     years ago   Family History  Problem Relation Age of Onset   Brain cancer Father    Hypothyroidism Mother    Irritable bowel syndrome Mother    Colonic polyp Mother        PRE-malignant polyp of rectosigmoid colon   Colon polyps Mother    Esophageal cancer Neg Hx    Stomach cancer Neg Hx    Pancreatic cancer Neg Hx    Liver disease Neg Hx    Colon cancer Neg Hx    Rectal cancer Neg Hx    Social History   Socioeconomic History   Marital status: Married    Spouse name: Not on file   Number of children: 3   Years of education: Not on file   Highest education level: Not on file  Occupational History   Not on file  Tobacco Use   Smoking status: Never Smoker   Smokeless tobacco: Never Used  Vaping Use   Vaping Use: Never used  Substance and Sexual Activity   Alcohol use: Yes    Comment: rarely   Drug use: No   Sexual activity: Not on file    ROS Constitutional: Denies fever, chills, weight loss/gain, headaches, insomnia,  night sweats or change in appetite. Does c/o fatigue. Eyes:  Denies redness, blurred vision, diplopia, discharge, itchy or watery eyes.  ENT: Denies discharge, congestion, post nasal drip, epistaxis, sore throat, earache, hearing loss,  dental pain, Tinnitus, Vertigo, Sinus pain or snoring.  Cardio: Denies chest pain, palpitations, irregular heartbeat, syncope, dyspnea, diaphoresis, orthopnea, PND, claudication or edema Respiratory: denies cough, dyspnea, DOE, pleurisy, hoarseness, laryngitis or wheezing.  Gastrointestinal: Denies dysphagia, heartburn, reflux, water brash, pain, cramps, nausea, vomiting, bloating, diarrhea, constipation, hematemesis, melena, hematochezia, jaundice or hemorrhoids Genitourinary: Denies dysuria, frequency, urgency, nocturia, hesitancy, discharge, hematuria or flank pain Musculoskeletal: Denies arthralgia, myalgia, stiffness, Jt. Swelling, pain, limp or strain/sprain. Denies Falls. Skin: Denies puritis, rash, hives, warts, acne, eczema or change in skin lesion Neuro: No weakness, tremor, incoordination, spasms, paresthesia or pain Psychiatric: Denies confusion, memory loss or sensory loss. Denies Depression. Endocrine: Denies change in weight, skin, hair change, nocturia, and paresthesia, diabetic polys, visual blurring or hyper / hypo glycemic episodes.  Heme/Lymph: No excessive bleeding, bruising or enlarged lymph nodes.  Physical Exam  BP 122/80    Pulse 83    Temp (!) 96.5 F (35.8 C)    Resp 16    Ht 5\' 10"  (1.778 m)    Wt 210 lb 6.4 oz (95.4 kg)    SpO2 98%    BMI 30.19 kg/m   General Appearance: Well nourished and well groomed and in no apparent distress.  Eyes: PERRLA, EOMs, conjunctiva no swelling or erythema, normal fundi and vessels. Sinuses: No frontal/maxillary tenderness ENT/Mouth: EACs patent / TMs  nl. Nares clear without erythema, swelling, mucoid exudates. Oral hygiene is good. No erythema, swelling, or exudate. Tongue normal, non-obstructing. Tonsils not swollen or erythematous. Hearing normal.  Neck:  Supple, thyroid not palpable. No bruits, nodes or JVD. Respiratory: Respiratory effort normal.  BS equal and clear bilateral without rales, rhonci, wheezing or stridor. Cardio: Heart sounds are normal with regular rate and rhythm and no murmurs, rubs or gallops. Peripheral pulses are normal and equal bilaterally without edema. No aortic or femoral bruits. Chest: symmetric with normal excursions and percussion.  Abdomen: Soft, with Nl bowel sounds. Nontender, no guarding, rebound, hernias, masses, or organomegaly.  Lymphatics: Non tender without lymphadenopathy.  Musculoskeletal: Full ROM all peripheral extremities, joint stability, 5/5 strength, and normal gait. Skin: Warm and dry without rashes, lesions, cyanosis, clubbing or  ecchymosis.  Neuro: Cranial nerves intact, reflexes equal bilaterally. Normal muscle tone, no cerebellar symptoms. Sensation intact.  Pysch: Alert and oriented X 3 with normal affect, insight and judgment appropriate.   Assessment and Plan  1. Annual Preventative/Screening Exam    2. Essential hypertension  - [place], RETROPERITNL ABD,  LTD - Urinalysis, Routine w reflex microscopic - EKG 12-Lead - Microalbumin / creatinine urine ratio - CBC with Differential/Platelet - COMPLETE METABOLIC PANEL WITH GFR - Magnesium - TSH  3. Hyperlipidemia, mixed  - [place], RETROPERITNL ABD,  LTD - EKG 12-Lead - Lipid panel - TSH  4. Abnormal glucose  - [place], RETROPERITNL ABD,  LTD - EKG 12-Lead - Hemoglobin A1c - Insulin, random  5. Vitamin D deficiency  - VITAMIN D 25 Hydroxy   6. Coronary artery disease involving native coronary  artery of native heart without angina pectoris  - EKG 12-Lead - Lipid panel  7. CKD (chronic kidney disease) stage 2, GFR 60-89 ml/min  - Urinalysis, Routine w reflex microscopic - Microalbumin / creatinine urine ratio - COMPLETE METABOLIC PANEL WITH GFR  8. Aorto-iliac atherosclerosis ([place]) by Pelvic CT scan 03/2016  - [place],  RETROPERITNL ABD,  LTD - EKG 12-Lead - Lipid panel  9. BPH with obstruction/lower urinary tract symptoms  - PSA  10. Severe obstructive sleep apnea-hypopnea  syndrome   11. Intolerance of continuous positive airway pressure (CPAP) ventilation   12. Screening for colorectal cancer  - POC Hemoccult Bld/Stl   13. Prostate cancer screening  - PSA  14. Screening for ischemic heart disease  - [place], RETROPERITNL ABD,  LTD - EKG 12-Lead  15. FH: hypertension  - [place], RETROPERITNL ABD,  LTD - EKG 12-Lead  16. Screening for AAA (aortic abdominal aneurysm)  - [place], RETROPERITNL ABD,  LTD  17. Medication management  - Urinalysis, Routine w reflex microscopic - Microalbumin / creatinine urine ratio - CBC with Differential/Platelet - COMPLETE METABOLIC PANEL WITH GFR - Magnesium - Lipid panel - TSH - Hemoglobin A1c - Insulin, random - VITAMIN D 25 Hydroxy         Patient was counseled in prudent diet, weight control to achieve/maintain BMI less than 25, BP monitoring, regular exercise and medications as discussed.  Discussed med effects and SE's. Routine screening labs and tests as requested with regular follow-up as recommended. Over 40 minutes of exam, counseling, chart review and high complex critical decision making was performed   Kirtland Bouchard, MD

## 2020-02-21 ENCOUNTER — Other Ambulatory Visit: Payer: Self-pay

## 2020-02-21 ENCOUNTER — Ambulatory Visit (INDEPENDENT_AMBULATORY_CARE_PROVIDER_SITE_OTHER): Payer: PPO | Admitting: Internal Medicine

## 2020-02-21 VITALS — BP 122/80 | HR 83 | Temp 96.5°F | Resp 16 | Ht 70.0 in | Wt 210.4 lb

## 2020-02-21 DIAGNOSIS — R7309 Other abnormal glucose: Secondary | ICD-10-CM | POA: Diagnosis not present

## 2020-02-21 DIAGNOSIS — Z789 Other specified health status: Secondary | ICD-10-CM

## 2020-02-21 DIAGNOSIS — I2510 Atherosclerotic heart disease of native coronary artery without angina pectoris: Secondary | ICD-10-CM

## 2020-02-21 DIAGNOSIS — Z1211 Encounter for screening for malignant neoplasm of colon: Secondary | ICD-10-CM

## 2020-02-21 DIAGNOSIS — Z0001 Encounter for general adult medical examination with abnormal findings: Secondary | ICD-10-CM

## 2020-02-21 DIAGNOSIS — Z0000 Encounter for general adult medical examination without abnormal findings: Secondary | ICD-10-CM

## 2020-02-21 DIAGNOSIS — E559 Vitamin D deficiency, unspecified: Secondary | ICD-10-CM | POA: Diagnosis not present

## 2020-02-21 DIAGNOSIS — Z79899 Other long term (current) drug therapy: Secondary | ICD-10-CM | POA: Diagnosis not present

## 2020-02-21 DIAGNOSIS — Z125 Encounter for screening for malignant neoplasm of prostate: Secondary | ICD-10-CM | POA: Diagnosis not present

## 2020-02-21 DIAGNOSIS — Z23 Encounter for immunization: Secondary | ICD-10-CM | POA: Diagnosis not present

## 2020-02-21 DIAGNOSIS — N138 Other obstructive and reflux uropathy: Secondary | ICD-10-CM | POA: Diagnosis not present

## 2020-02-21 DIAGNOSIS — E782 Mixed hyperlipidemia: Secondary | ICD-10-CM | POA: Diagnosis not present

## 2020-02-21 DIAGNOSIS — G4733 Obstructive sleep apnea (adult) (pediatric): Secondary | ICD-10-CM

## 2020-02-21 DIAGNOSIS — I10 Essential (primary) hypertension: Secondary | ICD-10-CM

## 2020-02-21 DIAGNOSIS — I700 Atherosclerosis of aorta: Secondary | ICD-10-CM

## 2020-02-21 DIAGNOSIS — Z136 Encounter for screening for cardiovascular disorders: Secondary | ICD-10-CM

## 2020-02-21 DIAGNOSIS — Z8249 Family history of ischemic heart disease and other diseases of the circulatory system: Secondary | ICD-10-CM

## 2020-02-21 DIAGNOSIS — I708 Atherosclerosis of other arteries: Secondary | ICD-10-CM

## 2020-02-21 DIAGNOSIS — N401 Enlarged prostate with lower urinary tract symptoms: Secondary | ICD-10-CM | POA: Diagnosis not present

## 2020-02-21 DIAGNOSIS — N182 Chronic kidney disease, stage 2 (mild): Secondary | ICD-10-CM

## 2020-02-21 NOTE — Progress Notes (Signed)
Abdominal Ultra sound normal.

## 2020-02-22 ENCOUNTER — Other Ambulatory Visit: Payer: Self-pay | Admitting: Internal Medicine

## 2020-02-22 LAB — CBC WITH DIFFERENTIAL/PLATELET
Absolute Monocytes: 450 cells/uL (ref 200–950)
Basophils Absolute: 30 cells/uL (ref 0–200)
Basophils Relative: 0.6 %
Eosinophils Absolute: 370 cells/uL (ref 15–500)
Eosinophils Relative: 7.4 %
HCT: 46.8 % (ref 38.5–50.0)
Hemoglobin: 15.7 g/dL (ref 13.2–17.1)
Lymphs Abs: 1860 cells/uL (ref 850–3900)
MCH: 29.6 pg (ref 27.0–33.0)
MCHC: 33.5 g/dL (ref 32.0–36.0)
MCV: 88.1 fL (ref 80.0–100.0)
MPV: 10 fL (ref 7.5–12.5)
Monocytes Relative: 9 %
Neutro Abs: 2290 cells/uL (ref 1500–7800)
Neutrophils Relative %: 45.8 %
Platelets: 219 10*3/uL (ref 140–400)
RBC: 5.31 10*6/uL (ref 4.20–5.80)
RDW: 12.1 % (ref 11.0–15.0)
Total Lymphocyte: 37.2 %
WBC: 5 10*3/uL (ref 3.8–10.8)

## 2020-02-22 LAB — COMPLETE METABOLIC PANEL WITH GFR
AG Ratio: 2.2 (calc) (ref 1.0–2.5)
ALT: 25 U/L (ref 9–46)
AST: 19 U/L (ref 10–35)
Albumin: 4.4 g/dL (ref 3.6–5.1)
Alkaline phosphatase (APISO): 67 U/L (ref 35–144)
BUN: 15 mg/dL (ref 7–25)
CO2: 28 mmol/L (ref 20–32)
Calcium: 9 mg/dL (ref 8.6–10.3)
Chloride: 104 mmol/L (ref 98–110)
Creat: 1.02 mg/dL (ref 0.70–1.25)
GFR, Est African American: 87 mL/min/{1.73_m2} (ref >=60)
GFR, Est Non African American: 75 mL/min/{1.73_m2} (ref >=60)
Globulin: 2 g/dL (calc) (ref 1.9–3.7)
Glucose, Bld: 106 mg/dL — ABNORMAL HIGH (ref 65–99)
Potassium: 4.3 mmol/L (ref 3.5–5.3)
Sodium: 139 mmol/L (ref 135–146)
Total Bilirubin: 0.6 mg/dL (ref 0.2–1.2)
Total Protein: 6.4 g/dL (ref 6.1–8.1)

## 2020-02-22 LAB — HEMOGLOBIN A1C
Hgb A1c MFr Bld: 5.7 % of total Hgb — ABNORMAL HIGH (ref <5.7)
Mean Plasma Glucose: 117 mg/dL
eAG (mmol/L): 6.5 mmol/L

## 2020-02-22 LAB — PSA: PSA: 1.14 ng/mL (ref <=4.0)

## 2020-02-22 LAB — LIPID PANEL
Cholesterol: 142 mg/dL (ref <200)
HDL: 47 mg/dL (ref >=40)
LDL Cholesterol (Calc): 65 mg/dL (calc)
Non-HDL Cholesterol (Calc): 95 mg/dL (calc) (ref <130)
Total CHOL/HDL Ratio: 3 (calc) (ref <5.0)
Triglycerides: 252 mg/dL — ABNORMAL HIGH (ref <150)

## 2020-02-22 LAB — URINALYSIS, ROUTINE W REFLEX MICROSCOPIC
Bilirubin Urine: NEGATIVE
Glucose, UA: NEGATIVE
Hgb urine dipstick: NEGATIVE
Ketones, ur: NEGATIVE
Leukocytes,Ua: NEGATIVE
Nitrite: NEGATIVE
Protein, ur: NEGATIVE
Specific Gravity, Urine: 1.008 (ref 1.001–1.03)
pH: 7.5 (ref 5.0–8.0)

## 2020-02-22 LAB — VITAMIN D 25 HYDROXY (VIT D DEFICIENCY, FRACTURES): Vit D, 25-Hydroxy: 52 ng/mL (ref 30–100)

## 2020-02-22 LAB — MICROALBUMIN / CREATININE URINE RATIO
Creatinine, Urine: 43 mg/dL (ref 20–320)
Microalb, Ur: <0.2 mg/dL

## 2020-02-22 LAB — INSULIN, RANDOM: Insulin: 51.1 u[IU]/mL — ABNORMAL HIGH

## 2020-02-22 LAB — MAGNESIUM: Magnesium: 2.1 mg/dL (ref 1.5–2.5)

## 2020-02-22 LAB — TSH: TSH: 2.56 mIU/L (ref 0.40–4.50)

## 2020-02-22 MED ORDER — SILDENAFIL CITRATE 100 MG PO TABS: ORAL_TABLET | ORAL | 1 refills | Status: DC

## 2020-02-22 NOTE — Progress Notes (Signed)
========================================================== - Test results slightly outside the reference range are not unusual. If there is anything important, I will review this with you,  otherwise it is considered normal test values.  If you have further questions,  please do not hesitate to contact me at the office or via My Chart.  ========================================================== ==========================================================  -  PSA - Very Low - Great  ========================================================== ==========================================================  -  Total Chol = 142   and LDL Chol = 65 - Both  Excellent   - Very low risk for Heart Attack  / Stroke ========================================================  - But ,  Triglycerides (   252    ) or fats in blood are too high  (goal is less than 150)    - Recommend avoid fried & greasy foods,  sweets / candy,   - Avoid white rice  (brown or wild rice or Quinoa is OK),   - Avoid white potatoes  (sweet potatoes are OK)   - Avoid anything made from white flour,                                        bagels, doughnuts, rolls, buns, biscuits, white and   wheat breads, pizza crust and traditional                                                            pasta made of white flour & egg white  - (vegetarian pasta or spinach or wheat pasta is OK).    - Multi-grain bread is OK - like multi-grain flat bread or  sandwich thins.   - Avoid alcohol in excess.   - Exercise is also important. ========================================================== ==========================================================  -  A1c = 5.7% - still on the borderline of Sugar, Suggest                             Try taking Cinnamon 1,000 mg capsule 2 x /day                                                             to help Normalize your blood  sugar ========================================================== ==========================================================  -  Insulin = 51 - is a little elevated  and shows insulin resistance                         Which is - a sign of early diabetes and associated with                        a 300 % greater risk for heart attacks, strokes, cancer &  Alzheimer type vascular dementia   - All this can be cured  and prevented with losing weight   - get Dr Fara Olden Fuhrman's books  'the End of Diabetes" &"the End of Dieting" ========================================================== ==========================================================  -  Vitamin D = 52  - slightly Low  ( Vitamin D goal is between 70-100 )    - Please I NCREASE your Vitamin D as directed. By 2,000 more units /day  - It is very important as a natural anti-inflammatory and helping the  immune system protect against viral infections, like the Covid-19    helping hair, skin, and nails, as well as reducing stroke and  heart attack risk.   - It helps your bones and helps with mood.  - It also decreases numerous cancer risks so please  take it as directed.   - Low Vit D is associated with a 200-300% higher risk for  CANCER   and 200-300% higher risk for HEART   ATTACK  &  STROKE.    - It is also associated with higher death rate at younger ages,   autoimmune diseases like Rheumatoid arthritis, Lupus,  Multiple Sclerosis.     - Also many other serious conditions, like depression, Alzheimer's  Dementia, infertility, muscle aches, fatigue, fibromyalgia   - just to name a few. ==========================================================  - All Else - CBC - Kidneys - Electrolytes - Liver - Magnesium & Thyroid    - all  Normal / OK ===========================================================

## 2020-03-08 DIAGNOSIS — H43811 Vitreous degeneration, right eye: Secondary | ICD-10-CM | POA: Diagnosis not present

## 2020-03-11 ENCOUNTER — Other Ambulatory Visit: Payer: Self-pay | Admitting: Neurology

## 2020-03-15 DIAGNOSIS — Z0100 Encounter for examination of eyes and vision without abnormal findings: Secondary | ICD-10-CM | POA: Diagnosis not present

## 2020-03-19 ENCOUNTER — Other Ambulatory Visit: Payer: Self-pay | Admitting: Neurology

## 2020-03-19 ENCOUNTER — Encounter: Payer: Self-pay | Admitting: Neurology

## 2020-03-19 MED ORDER — ZALEPLON 5 MG PO CAPS: 5.0000 mg | ORAL_CAPSULE | Freq: Every evening | ORAL | 2 refills | Status: DC | PRN

## 2020-04-02 ENCOUNTER — Other Ambulatory Visit: Payer: Self-pay | Admitting: Internal Medicine

## 2020-04-02 DIAGNOSIS — I2510 Atherosclerotic heart disease of native coronary artery without angina pectoris: Secondary | ICD-10-CM

## 2020-04-02 DIAGNOSIS — I10 Essential (primary) hypertension: Secondary | ICD-10-CM

## 2020-04-03 ENCOUNTER — Other Ambulatory Visit: Payer: Self-pay | Admitting: Internal Medicine

## 2020-04-03 DIAGNOSIS — I2510 Atherosclerotic heart disease of native coronary artery without angina pectoris: Secondary | ICD-10-CM

## 2020-04-03 MED ORDER — CLOPIDOGREL BISULFATE 75 MG PO TABS: ORAL_TABLET | ORAL | 3 refills | Status: DC

## 2020-04-11 ENCOUNTER — Other Ambulatory Visit: Payer: Self-pay

## 2020-04-11 ENCOUNTER — Other Ambulatory Visit: Payer: Self-pay | Admitting: Internal Medicine

## 2020-04-11 DIAGNOSIS — F32A Depression, unspecified: Secondary | ICD-10-CM

## 2020-04-11 MED ORDER — BUPROPION HCL ER (XL) 150 MG PO TB24: ORAL_TABLET | ORAL | 3 refills | Status: DC

## 2020-04-18 ENCOUNTER — Other Ambulatory Visit: Payer: Self-pay | Admitting: Internal Medicine

## 2020-04-18 MED ORDER — SILDENAFIL CITRATE 100 MG PO TABS: ORAL_TABLET | ORAL | 1 refills | Status: DC

## 2020-04-24 ENCOUNTER — Other Ambulatory Visit: Payer: Self-pay

## 2020-04-24 DIAGNOSIS — Z1211 Encounter for screening for malignant neoplasm of colon: Secondary | ICD-10-CM

## 2020-04-24 LAB — POC HEMOCCULT BLD/STL (HOME/3-CARD/SCREEN)
Card #2 Fecal Occult Blod, POC: NEGATIVE
Card #3 Fecal Occult Blood, POC: NEGATIVE
Fecal Occult Blood, POC: NEGATIVE

## 2020-04-30 DIAGNOSIS — Z1211 Encounter for screening for malignant neoplasm of colon: Secondary | ICD-10-CM | POA: Diagnosis not present

## 2020-04-30 DIAGNOSIS — Z1212 Encounter for screening for malignant neoplasm of rectum: Secondary | ICD-10-CM | POA: Diagnosis not present

## 2020-05-01 ENCOUNTER — Other Ambulatory Visit: Payer: Self-pay | Admitting: Cardiovascular Disease

## 2020-05-04 ENCOUNTER — Other Ambulatory Visit: Payer: Self-pay | Admitting: Neurology

## 2020-05-04 ENCOUNTER — Other Ambulatory Visit: Payer: Self-pay | Admitting: Internal Medicine

## 2020-05-04 DIAGNOSIS — I10 Essential (primary) hypertension: Secondary | ICD-10-CM

## 2020-05-06 ENCOUNTER — Other Ambulatory Visit: Payer: Self-pay | Admitting: Internal Medicine

## 2020-05-06 DIAGNOSIS — E782 Mixed hyperlipidemia: Secondary | ICD-10-CM

## 2020-05-06 MED ORDER — EZETIMIBE 10 MG PO TABS: ORAL_TABLET | ORAL | 3 refills | Status: DC

## 2020-05-17 ENCOUNTER — Ambulatory Visit: Payer: Medicare Other | Admitting: Neurology

## 2020-05-23 ENCOUNTER — Encounter: Payer: Self-pay | Admitting: Neurology

## 2020-05-23 DIAGNOSIS — G5601 Carpal tunnel syndrome, right upper limb: Secondary | ICD-10-CM | POA: Diagnosis not present

## 2020-05-24 ENCOUNTER — Ambulatory Visit: Payer: PPO | Admitting: Neurology

## 2020-05-24 ENCOUNTER — Encounter: Payer: Self-pay | Admitting: Neurology

## 2020-05-24 VITALS — BP 106/71 | HR 69 | Ht 70.0 in | Wt 217.0 lb

## 2020-05-24 DIAGNOSIS — F5104 Psychophysiologic insomnia: Secondary | ICD-10-CM

## 2020-05-24 DIAGNOSIS — G4733 Obstructive sleep apnea (adult) (pediatric): Secondary | ICD-10-CM

## 2020-05-24 DIAGNOSIS — M79641 Pain in right hand: Secondary | ICD-10-CM

## 2020-05-24 DIAGNOSIS — F5101 Primary insomnia: Secondary | ICD-10-CM | POA: Diagnosis not present

## 2020-05-24 DIAGNOSIS — Z9989 Dependence on other enabling machines and devices: Secondary | ICD-10-CM

## 2020-05-24 MED ORDER — ZOLPIDEM TARTRATE 5 MG PO TABS: 5.0000 mg | ORAL_TABLET | Freq: Every evening | ORAL | 0 refills | Status: DC | PRN

## 2020-05-24 NOTE — Patient Instructions (Signed)

## 2020-05-24 NOTE — Progress Notes (Signed)
SLEEP MEDICINE CLINIC   Provider:  [name], MD  Referring Provider: [name] Pinto, MD Primary Care Physician:  Bryan Pinto, MD  Chief Complaint  Patient presents with  . Follow-up    Pt alone, rm 10. Pt states that the sonata has not been helpful. Pt has been off the Bryan [name]. States that he would like to go back. He has in the last month been using CPAP and tolerating for the most part. DME-[facility] ([facility]).     RV on 05-24-2020: I have the pleasure of meeting Bryan [name] today in a revisit who is just a couple of days short of his 70th birthday.  He has used the CPAP compliantly is a 77% compliance 23 out of 30 days his average use at time of 4 hours and 59 minutes on days used.  However they have been 3 days where he did not use the machine.  He has using an AutoSet between 8 and 18 cm water pressure with 3 cm expiratory pressure relief and achieved an AHI residual of only 2.3/h.  I considered that his apnea is very well controlled.  The pressure at the 95th percentile is 14.3 cmH2O and he does have moderate air leakage.  He has just changed from a larger nasal pillow to a medium size and finds this fit him best.  He remains a patient with chronic insomnia has in the past had chronic pain, hypertension, coronary artery disease and we have tried and failed Klonopin, Lunesta, doxepin, trazodone he has tried [employer] and has not had the desired effect from it even after he doubled the dose.  He has never seen a cognitive behavioral therapist for insomnia and this would be my strong recommendation however he also stated that his best result was ever achieved on a CR release Ambien So and I wonder if he is just not able to respond to any other medication or intervention.   We had used 6.8 mg Ambien CR in the year 2020 and we had prescribed 5 mg of Sonata in 2021, both with limited success.  He has failed a dental device that Dr. Ron [name] has made in the past.  He continued to have  felt a high degree of fatigue but he was not as excessively sleepy.  He does have a high risk higher sleep efficiency when he uses CPAP;  And he dreams about 3 or 4 times a week on average.       11-17-2019: I have the pleasure of seeing [name] again he had undergone a polysomnography test and had previously had a home sleep test which confirmed an AHI of 30.9.  His titration to positive airway pressure took place on 18 May of this year under a Respironics DreamWear mask this was a full facemask which spares the nose and was used in size large.  The patient did well actually as long as he could avoid sleeping on his back.  Sleep-related hypoxemia improved on the final pressure of titration and central apneas were actually reduced on the CPAP there were still some PLM's but much less than in the there is at the beginning of the study.  The total oxygen desaturation time equaled only 8 minutes.  I ordered an auto CPAP setting between 8 and 18 cmH2O best to centimeter EPR and try to persuade the patient to sleep on his back.  He has noticed at home that he has much less restless legs now but he has not  been able to use his CPAP in the lab.date Response him to have been to his CPAP at 16 cmH2O, and therefore his AutoSet was meant to encompass the pressure when he uses the machine his AHI is under 3/h with some residual obstructive apneas but he has not been able to sleep with it on it is not particularly that the mask or machine bothers him it is his underlying insomnia but has not improved and makes it so hard for him to sleep with or without machine.  He now uses an adjustable bed at home and that to him feels much better so what I like for him to try as we are changing his sleep aid #1 again and will exchange the 6.8 mg Ambien CR to  an immediate release-  I will offer Sonata - 5 mg.   PS: Wife reports a lot of microsleep attacks.   RV on 05-03-2019 CD. [name] is returning after PSG test and  reports ongoing dependency on Ambien- cannot sleep with trazodone. He had undergone a home sleep test on April 14 and today the home sleep test confirmed an AHI of 30.9 which is considered severe apnea, much higher REM sleep dependent AHI of 52.3 and a complete 9-hour sleep time.  Very concerning however 95.8 minutes out of his total sleep time there in oxygen desaturation.  Positional data did not register.  Based on these home sleep test data I think that a dental device is not an appropriate treatment.  There is such as strict dependency on REM sleep with oxygen desaturation and variable heart rates but only a CPAP device is actually an appropriate treatment.  Even the inspire device is not as likely to help him overcome REM dependent sleep apnea.  His continues to endorse the Epworth Sleepiness Scale at 16 out of 24 points, there was no fatigue endorsed.  He continues to have trouble sleeping at night and reported that he slept rather well when using Ambien.  I would consider him by now dependent on sleep aids and I will continue to provide the medication that he needs to get sleep also we will probably need to alternate 4 days of Ambien with 3 days of trazodone or something else to help him slowly wean off. Offered belsomra. He uses melatonin. He has tried oils, soft meditative music, all helps to relax, none helps to sleep through.   RV on 03-31-2019, [name], 69 years of age, and a patient with OSA and failed PAP therapy- he has already been vaccinated for Covid, both shots, Moderna. Dr Bryan [name] made a dental device that has only been partially effective as there is REM dependent apnea. We meet to re cap and look for alternatives. [name] who prefers to go by Bryan [name] has had trouble with his front teeth especially his left canine, and he relates the breakage of the tooth to the difficulties and resistance of the dental device he wore.  So he is reluctant to get back on it but also because it can cause jaw  pain as well as not treating his apnea effectively.  Dr. Ron [name]' had shown that there was still a high residual AHI. Dr. Ron [name] allowed me to review his diagnostic report this was a ResMed device, his so home sleep test was done by [facility].  Supine AHI 30.8 of which apneas were 17.9, he slept 1 hour 10 minutes in supine so this was a big part of the sleep study.  Nonsupine  sleep AHI 1.92 apneas 0.3/h.  Total events all obstructive no Cheyne-Stokes respiration no prolonged oxygen desaturation heart rate variability was great between 45 bpm and 148 bpm but this is notoriously artifact prone in any home sleep test.  [name] also endorsed the fatigue severity scale today at 53 out of 63 points which is high, his sleep has not been refreshing or restorative and is highly fragmented.  He endorsed the Epworth Sleepiness Scale at 8 points out of 24.  I would like to recap his sleep study from 10 January 2016 at the time he only slept 68% of the recorded time in the lab his AHI was 21.6 and he did not reach REM sleep.  The problem is that we could not evaluate REM sleep dependent apnea in a patient who never entered REM sleep in the first place.  He returned for full night titration on 30 January the same year his sleep efficiency the first night was 91.6% his AHI reduced to 0.9 this time REM sleep was entered and during REM sleep he still has a 3.8 apneas per hour  He often lost the CPAP interface in the night- the snoring was gone while he used the CPAP and the dental device.  Still had REM dependent apneas- he has heard of the inspire device but wasmn't aware of the implant nature- it's a hypoglossal CN stimulator. It still opens the airway but does not assist the air flow to the ribcage.    He reports insomnia. He has tried [place] -its giving him a metal taste and stopped working. Has had ambien but not ER form .never tried trazodone.      HPI: I have the pleasure of seeing [name] today on 10/27/2016,  he reports he has struggled with CPAP use in spite of trying Klonopin, Lunesta and even Ambien to help him overcome the insomnia that he developed. His first week compliant with CPAP was rather easy but something changed and he would lay awake for an hour before initiating sleep. The CPAP setting of 11 cm water with 3 cm EPR his residual AHI is excellent at only 2.6 apneas per hour but he only used the machine for 4 out of the last 30 days because of the bothersome problem of sleep initiation. He feels tired and fatigued during the day but he has also name several stressors that have culminated over the last 6 month and may be the cause of his insomnia. I would like for him to try an SSRi, but he just started on Zoloft. He wakes with a puffy uvula and parched mouth.  He may be a good candidate for a dental device/    [name], after he  had spent several vacations also in [place], [name]  He went to [place] in January when he needed a wheelchair to get around !  [name] presents today for his first revisit after a sleep study from 01/10/2016.His sleep study showed moderate sleep apnea with an AHI of 21.6 per hour, REM sleep had not been recorded but his supine positional sleep accentuated the apneas too 49.6 per hour. Avoiding the supine position was one of the recommendations along side with CPAP use. He returned for CPAP titration on 02/05/2016 and was titrated to 11 cm water pressure with a reduction of the AHI to 0.0 and was significant REM sleep rebound. He had some periodic limb movements but few arousals at 1.2 per hour, and  his heart rate stayed in normal sinus rhythm. Sleep efficiency was 92% he did not have oxygen desaturations of significance.  In the meantime he has been using CPAP at 11 cm water pressure but his compliance has been poor he has only use it for 47% of the time for over 4 hours with an average user time of 3 hours and 1 minute. His AHI is  significantly reduced when he uses the machine at 1.3 per hour. Over the last week he has made a concerted effort to use CPAP at 5 hours and 15 minutes daily,  He has undergone hip and nerve-root injections, with severe pain being only gradually relieved he sleeps often in a recliner. Since early April he had finally enough pain control and is less insomnic. He explained that he does only take tylenol 3 when needed, less than twice a week.  His left leg still jerks a lot when in bed or reclined. He has noted a right hand tremor.     [name] is a 70 y.o. male , seen here as a referral from Dr. Melford Aase for snoring, witnessed apnea and needs an evaluation. Last week he was in [place] , [place], and didn't snore. His wife was amazed. He was skiing every day and slept well. Over the last couple of years his snoring has become louder, to a level where his wife often has left the marital bedroom. His past medical history and medical history is positive for coronary artery disease in the native coronary artery, an 80% stenosis of the distal LAD, 99% stenosis proximal and. A stent was placed. He also has diverticulosis, gastroesophageal reflux disease, hernia, he had supraventricular tachycardia and was treated with an ablation in 2004, hyperlipidemia, hypertension, symptomatic palpitations, PVCs, vitamin D deficiency. I reviewed his medication; he has silenor/ doxepin  available at bedtime as a sleep aid, takes Zoloft, Crestor, Zofran in case of nausea, lisinopril, gabapentin 3 times a day Zetia by systolic, Plavix, Flexeril 10 mg for muscle spasms as needed. He has noted a very dry mouth and his dentist noted a dry mouth as well, likely related to doxepin. lunesta failed- metal taste. Best on klonopin. Sleep habits are as follows: The patient is usually in bed by 10 PM but struggles to fall asleep without medication. He is taking doxepin. He sometimes has myoclonic tics at night. He averages 4-6 hours  of nocturnal sleep, the bedroom is cool, quiet and dark. He prefers to sleep on his side. 2 pillows for head support usually. He will have one nocturia, during the night , sometimes has trouble to go back to sleep. The bathroom break is usually between 2 and 3 AM . He has also 3-4 times a week dreams that he can recall, and sometimes tries to enact. These wake him up- arm and leg movements.  His dreams are not nightmarish in character and he usually does not feel that he has to defend himself or is under threat  He wakes up with a very dry mouth. No headaches, rarely palpitations. Sleep medical history and family sleep history:  No tonsillectomy, TBI, and no sinus ENT surgery.    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 [employer] and household products.  Just retired September 2021-  Never smoked, caffeine , 2 cups of coffee in AM, sometimes Soda, rare tea.  Drinks 2-4 glasses of wine weekly- .   Review of Systems: Out of a complete 14  system review, the patient complains of only the following symptoms, and all other reviewed systems are negative.  tinnitus,  Diplopia.  Non compliant with CPAP-  Epworth score 7 / 24 on CPAP , Fatigue severity score N/A- , depression score 2/15 last visit .  Ambien, Sinequan, Lunesta.    How likely are you to doze in the following situations: 0 = not likely, 1 = slight chance, 2 = moderate chance, 3 = high chance  Sitting and Reading? Watching Television? Sitting inactive in a public place (theater or meeting)? Lying down in the afternoon when circumstances permit? Sitting and talking to someone? Sitting quietly after lunch without alcohol? In a car, while stopped for a few minutes in traffic? As a passenger in a car for an hour without a break?  Total = 14/ 24   FSS at 41/ 63.  high energy, attention deficit hyperactivity.      Social History   Socioeconomic History  . Marital status: Married    Spouse  name: Not on file  . Number of children: 3  . Years of education: Not on file  . Highest education level: Not on file  Occupational History  . Not on file  Tobacco Use  . Smoking status: Never Smoker  . Smokeless tobacco: Never Used  Vaping Use  . Vaping Use: Never used  Substance and Sexual Activity  . Alcohol use: Yes    Comment: rarely  . Drug use: No  . Sexual activity: Not on file  Other Topics Concern  . Not on file  Social History Narrative   Drinks 2 cups of coffee a day    Social Determinants of Health   Financial Resource Strain: Not on file  Food Insecurity: Not on file  Transportation Needs: Not on file  Physical Activity: Not on file  Stress: Not on file  Social Connections: Not on file  Intimate Partner Violence: Not on file    Family History  Problem Relation Age of Onset  . Bryan [name] cancer Father   . Hypothyroidism Mother   . Irritable bowel syndrome Mother   . Colonic polyp Mother        PRE-malignant polyp of rectosigmoid colon  . Colon polyps Mother   . Esophageal cancer Neg Hx   . Stomach cancer Neg Hx   . Pancreatic cancer Neg Hx   . Liver disease Neg Hx   . Colon cancer Neg Hx   . Rectal cancer Neg Hx     Past Medical History:  Diagnosis Date  . Allergy   . CAD (coronary artery disease), native coronary artery    Cath 04/19/2001  normal Left main, moderate proximal disease, 80% stenosis distal LAD, 99% stenosis proximal OM 1, 60% stenosis mid RCA, 70% stenosis ostial mid PDA  PCI with bare metal stent of OM1 same date 3.5 x 18 mm Multilink    . Diverticulosis   . GERD (gastroesophageal reflux disease)   . Hernia   . History of PSVT    Ablation done in 2004 at [facility]   . Hyperlipidemia   . Hypertension   . Sleep apnea   . Symptomatic PVCs   . Vitamin D deficiency     Past Surgical History:  Procedure Laterality Date  . ACHILLES TENDON REPAIR     left  . APPENDECTOMY    . CARDIAC ELECTROPHYSIOLOGY MAPPING AND ABLATION     . COLONOSCOPY    . CORONARY ANGIOPLASTY WITH STENT PLACEMENT  years ago    Current Outpatient Medications  Medication Sig Dispense Refill  . albuterol (PROAIR HFA) 108 (90 Base) MCG/ACT inhaler Use 2 inhalations 15 minutes apart every 4 hours if need to rescue Asthma 48 g 3  . buPROPion (WELLBUTRIN XL) 150 MG 24 hr tablet Take  1 tablet  Daily  for Mood, Focus & Concentration 90 tablet 3  . clopidogrel (PLAVIX) 75 MG tablet Take  1 tablet  Daily  to Prevent Blood Clots 90 tablet 3  . ezetimibe (ZETIA) 10 MG tablet Take  1 tablet  Daily  for Cholesterol 90 tablet 3  . nebivolol (BYSTOLIC) 10 MG tablet TAKE ONE TABLET BY MOUTH DAILY FOR FOR BLOOD PRESSURE 90 tablet 1  . nitroGLYCERIN (NITROSTAT) 0.4 MG SL tablet Dissolve 1 tablet under tongue every 3 to 5 minutes if needed for Angina 50 tablet 11  . rosuvastatin (CRESTOR) 20 MG tablet Take 1 tablet (20 mg total) by mouth daily. Pt needs to make appt with provider for further refills - 1st attempt 30 tablet 0  . sildenafil (VIAGRA) 100 MG tablet Take  1/2 to 1 tablet  Daily  1 hour  as needed before XXXX 30 tablet 1  . tamsulosin (FLOMAX) 0.4 MG CAPS capsule Take    1 tablet    at Bedtime    for Prostate (Patient not taking: Reported on 02/21/2020) 90 capsule 0  . valACYclovir (VALTREX) 500 MG tablet Take 1 tablet 2 x /day for Fever Blisters / Cold Sores 180 tablet 1  . zaleplon (SONATA) 5 MG capsule Take 1 capsule (5 mg total) by mouth at bedtime as needed for sleep. 30 capsule 2   No current facility-administered medications for this visit.    Allergies as of 05/24/2020 - Review Complete 05/24/2020  Allergen Reaction Noted  . Lisinopril Cough 06/05/2016  . Amoxicillin Hives and Swelling 11/16/2012  . Penicillins Swelling 12/10/2011    Vitals: BP 106/71   Pulse 69   Ht 5\' 10"  (1.778 m)   Wt 217 lb (98.4 kg)   BMI 31.14 kg/m  Last Weight:  Wt Readings from Last 1 Encounters:  05/24/20 217 lb (98.4 kg)   [id number] mass index is  31.14 kg/m.     Last Height:   Ht Readings from Last 1 Encounters:  05/24/20 5\' 10"  (1.778 m)    Physical exam:  General: The patient is awake, alert and appears not in acute distress. The patient is well groomed. Head: Normocephalic, atraumatic. Neck is supple. Mallampati 3.  neck circumference: 17. Nasal airflow congested and present with a dry mouth. All natural teeth.  Cardiovascular:  Regular rate and rhythm, without  murmurs or carotid bruit, and without distended neck veins. Respiratory: Lungs are clear to auscultation. Skin:  Facial puffiness,  Trunk: BMI is 31-, has a less arthralgic gait.  Neurologic exam :The patient is awake and alert, oriented to place and time.    Cranial nerves: no loss of smell or taste  Pupils are equal and briskly reactive to light. No evidence of cataract, but blured vision at night is still present.  he reports  Resolution of diplopia.  Extraocular movements  in vertical and horizontal planes  without nystagmus.  Visual fields by finger perimetry are intact. Hearing to finger rub intact. Facial sensation intact to fine touch.  Facial motor strength is symmetric and tongue and uvula move midline.  Shoulder shrug was symmetrical.  Motor exam:  No restless legs, no Spasticity.  Sensory:  Numbness in right hand, not carpaltunnel, but cervical radiculopathy- and chronically present now- has seen Dr. Ellene Route. .   Deep tendon reflexes: in the upper and lower extremities are symmetric and intact.  Babinski maneuver deferred. .     Assessment:  After physical and neurologic examination, review of laboratory studies,  Personal review of imaging studies, reports of other /same  Imaging studies,   Results of polysomnography/ neurophysiology testing and pre-existing records as far as provided in visit., my 18 minute assessment is :  1)  His sleep study (HST ) confirmed OSA and REM sleep dependent form- and in lab CPAP titration to 16 cm relieved this. He needs  to keep using his CPAP for this form of apnea and he has finaly found a comfortable mask.     2) Insomnia - chronic , failed Ambien, Klonopin, Lunesta. failed doxepin,trazodone,  - he slept better opening the CR Ambien capsule, tus eliminating the CR form.  - failed sonata.  I will prescribe generic ambien 5 mg , not XR form.    3) Dr Ellene Route has set up for NCV and EMG in right hand- painful. Urgent NCV and EMG for severe right hand pain at night, has cervical para-spinal tenderness, has trouble holding a pen or fork.    I recommend again (!)  Third time -cognitive behavior therapy. There is several smart phone applications that would play meditative music or give breathing direction.      His comorbidities, including hypertension, coronary artery disease, hyperlipidemia, and chronic pain - all affect sleep as well as the medications.  Plan:  Treatment plan and additional workup : q 12 month follow up.   Autotitration: CPAP 5-18 cm water 2 cm EPR and Ambien.  Night time pain form the right hand, has NCV set up through Dr Ellene Route.    Bryan [name], M.D.   CC: Bryan [name], [place],  [place] [address]

## 2020-05-29 ENCOUNTER — Ambulatory Visit: Payer: PPO | Admitting: Adult Health

## 2020-06-03 ENCOUNTER — Other Ambulatory Visit: Payer: Self-pay | Admitting: Cardiovascular Disease

## 2020-06-05 DIAGNOSIS — C44529 Squamous cell carcinoma of skin of other part of trunk: Secondary | ICD-10-CM | POA: Diagnosis not present

## 2020-06-05 DIAGNOSIS — Z85828 Personal history of other malignant neoplasm of skin: Secondary | ICD-10-CM | POA: Diagnosis not present

## 2020-06-05 DIAGNOSIS — B079 Viral wart, unspecified: Secondary | ICD-10-CM | POA: Diagnosis not present

## 2020-06-05 DIAGNOSIS — D485 Neoplasm of uncertain behavior of skin: Secondary | ICD-10-CM | POA: Diagnosis not present

## 2020-06-07 ENCOUNTER — Ambulatory Visit (INDEPENDENT_AMBULATORY_CARE_PROVIDER_SITE_OTHER): Payer: PPO | Admitting: Diagnostic Neuroimaging

## 2020-06-07 ENCOUNTER — Encounter: Payer: PPO | Admitting: Diagnostic Neuroimaging

## 2020-06-07 DIAGNOSIS — M79641 Pain in right hand: Secondary | ICD-10-CM

## 2020-06-07 DIAGNOSIS — Z0289 Encounter for other administrative examinations: Secondary | ICD-10-CM

## 2020-06-07 DIAGNOSIS — F5104 Psychophysiologic insomnia: Secondary | ICD-10-CM

## 2020-06-07 DIAGNOSIS — F5101 Primary insomnia: Secondary | ICD-10-CM

## 2020-06-07 DIAGNOSIS — G4733 Obstructive sleep apnea (adult) (pediatric): Secondary | ICD-10-CM

## 2020-06-11 ENCOUNTER — Encounter: Payer: Self-pay | Admitting: Internal Medicine

## 2020-06-11 ENCOUNTER — Ambulatory Visit: Payer: PPO | Admitting: Internal Medicine

## 2020-06-11 NOTE — Procedures (Signed)
[facility]  NCS (NERVE CONDUCTION STUDY) WITH EMG (ELECTROMYOGRAPHY) REPORT   STUDY DATE: 06/07/20 PATIENT NAME: Bryan [name] DOB: 1951/01/06 MRN: [id number]  ORDERING CLINICIAN: [name], MD   TECHNOLOGIST: Sherre Scarlet ELECTROMYOGRAPHER: Earlean Polka. Kallee Nam, MD  CLINICAL INFORMATION: 70 year old male with right arm pain and weakness.  FINDINGS: NERVE CONDUCTION STUDY:  Right median motor response has prolonged distal latency, decreased amplitude and slow conduction velocity.  Left median and right ulnar motor responses are normal.  Right ulnar F-wave latency is normal.  Right median sensory response could not be obtained.  Left median and right ulnar sensory responses are normal.   NEEDLE ELECTROMYOGRAPHY:  Needle examination of right upper extremity and right cervical paraspinal muscles is normal.    IMPRESSION:   Abnormal study demonstrating: -Right median neuropathy at the wrist consistent with moderate-severe right carpal tunnel syndrome.    INTERPRETING PHYSICIAN:  Penni Bombard, MD Certified in Neurology, Neurophysiology and Neuroimaging  [facility] [address] [place], [place] [address] [phone]  [facility]    Nerve / Sites Muscle Latency Ref. Amplitude Ref. Rel Amp Segments Distance Velocity Ref. Area    ms ms mV mV %  cm m/s m/s mVms  R Median - APB     Wrist APB 7.6 ?4.4 0.6 ?4.0 100 Wrist - APB 7   2.0     Upper arm APB 14.5  1.4  229 Upper arm - Wrist 24 35 ?49 6.0     Ulnar Wrist APB 3.0  1.5  109 Ulnar Wrist - APB    3.2  L Median - APB     Wrist APB 3.9 ?4.4 5.7 ?4.0 100 Wrist - APB 7   21.2     Upper arm APB 8.4  4.9  85.7 Upper arm - Wrist 22 49 ?49 18.1  R Ulnar - ADM     Wrist ADM 2.9 ?3.3 7.0 ?6.0 100 Wrist - ADM 7   27.1     B.Elbow ADM 7.0  6.6  94.4 B.Elbow - Wrist 23 57 ?49 24.5     A.Elbow ADM 8.8  6.8  103 A.Elbow - B.Elbow 10 53 ?49 24.7           SNC    Nerve /  Sites Rec. Site Peak Lat Ref.  Amp Ref. Segments Distance    ms ms V V  cm  R Median - Orthodromic (Dig II, Mid palm)     Dig II Wrist NR ?3.4 NR ?10 Dig II - Wrist 14  L Median - Orthodromic (Dig II, Mid palm)     Dig II Wrist 3.4 ?3.4 12 ?10 Dig II - Wrist 14  R Ulnar - Orthodromic, (Dig V, Mid palm)     Dig V Wrist 2.7 ?3.1 8 ?5 Dig V - Wrist 56           F  Wave    Nerve F Lat Ref.   ms ms  R Ulnar - ADM 31.8 ?32.0       EMG Summary Table    Spontaneous MUAP Recruitment  Muscle IA Fib PSW Fasc Other Amp Dur. Poly Pattern  R. Deltoid Normal None None None _______ Normal Normal Normal Normal  R. Biceps brachii Normal None None None _______ Normal Normal Normal Normal  R. Triceps brachii Normal None None None _______ Normal Normal Normal Normal  R. Flexor carpi radialis Normal None None None _______ Normal Normal Normal  Normal  R. First dorsal interosseous Normal None None None _______ Normal Normal Normal Normal  R. Cervical paraspinals Normal None None None _______ Normal Normal Normal Normal

## 2020-06-11 NOTE — Patient Instructions (Addendum)
D

## 2020-06-11 NOTE — Progress Notes (Addendum)
NO SHOW

## 2020-06-11 NOTE — Progress Notes (Signed)
Future Appointments  Date Time Provider [place]  06/12/2020 11:00 AM [name] Pinto, MD [facility] None  09/25/2020  4:00 PM [name] Pinto, MD [facility] None  11/21/2020  9:30 AM Dohmeier, Asencion Partridge, MD [facility] None  02/26/2021  2:00 PM [name] Pinto, MD [facility] None    History of Present Illness:       This very nice 70 y.o.  [name] presents for 3 month follow up with HTN, HLD, ASCAD s/p PCA, OSA Pre-Diabetes and Vitamin D Deficiency.  Patient is followed by Dr Brett Fairy for OSA on CPAP.  Abd/pelvicCT scanin 2018 showed Aorto-IliacAtherosclerosis.        Patient was seen by his dentist yesterday for a painful upper lt 3rd molar. He was advised the tooth is cracked & will need to be extracted. Also he's c/o pain & sinus pressure over the Left cheek .        Patient is treated for HTN (1990) & BP has been controlled at home. Today's BP is at goal - 108/60. Patient has hx/o PCA/Stent kin 2003 rescuing NSTEMI. In 2004 &2010, he had Ablations for pAfib. Patient has had no complaints of any cardiac type chest pain, palpitations, dyspnea / orthopnea / PND, dizziness, claudication, or dependent edema.       Hyperlipidemia is controlled with diet & & Rosuvastatin. Patient denies myalgias or other med SE's. Last Lipids were at goal except elevated Trig's:  Lab Results  Component Value Date   CHOL 142 02/21/2020   HDL 47 02/21/2020   LDLCALC 65 02/21/2020   LDLDIRECT 59 11/07/2019   TRIG 252 (H) 02/21/2020   CHOLHDL 3.0 02/21/2020     Also, the patient has history of PreDiabetes (A1c 5.8% /2019) and has had no symptoms of reactive hypoglycemia, diabetic polys, paresthesias or visual blurring.  Last A1c was near goal:  Lab Results  Component Value Date   HGBA1C 5.7 (H) 02/21/2020            Further, the patient also has history of Vitamin D Deficiency ("26" /2008) and supplements vitamin D without any suspected side-effects. Last vitamin D was near goal  (70-100):  Lab Results  Component Value Date   VD25OH 52 02/21/2020     Current Outpatient Medications on File Prior to Visit  Medication Sig  . buPROPion-XL) 150 MG Take  1 tablet  Daily    . clopidogrel  75 MG tablet Take  1 tablet  Daily  . ezetimibe ) 10 MG tablet Take  1 tablet  Daily  . nebivolol10 MG tablet TAKE ONE TABLET DAILY   . NITROSTAT 0.4 MG SL if needed for Angina  . rosuvastatin  20 MG tablet Take 1 tablet daily.   . sildenafil (VIAGRA) 100 MG tablet Take  1/2 to 1 tablet  Daily  1 hour  as needed before XXXX   . valACYclovir  500 MG tablet Take 1 tablet 2 x /day for Fever Blisters / Cold Sores  . zolpidem (AMBIEN) 5 MG tablet Take 1 tablet  at bedtime as needed for sleep.    Allergies  Allergen Reactions  . Lisinopril Cough    ACEi cough  . Amoxicillin Hives and Swelling  . Penicillins Swelling    PMHx:   Past Medical History:  Diagnosis Date  . Allergy   . CAD (coronary artery disease), native coronary artery    Cath 04/19/2001  normal Left main, moderate proximal disease, 80% stenosis distal LAD, 99% stenosis proximal OM 1, 60% stenosis mid  RCA, 70% stenosis ostial mid PDA  PCI with bare metal stent of OM1 same date 3.5 x 18 mm Multilink    . Diverticulosis   . GERD (gastroesophageal reflux disease)   . Hernia   . History of PSVT    Abl[facility] done in 2004 [facility]   . Hyperlipidemia   . Hypertension   . Sleep apnea   . Symptomatic PVCs   . Vitamin D deficiency      Immunization History  Administered Date(s) Administered  . Influenza Split 10/13/2013, 10/23/2014  . Influenza, High Dose Seasonal PF 01/09/2017, 10/27/2017, 11/21/2019  . Influenza,inj,quad, With Preservative 01/13/2013  . Influenza-Unspecified 10/07/2011  . PPD Test 10/13/2013, 10/23/2014  . Pneumococcal Conjugate-13 10/13/2013  . Pneumococcal Polysaccharide-23 10/23/2014, 02/21/2020  . Td 01/06/2006  . Tdap 03/06/2016  . Zoster, Live 09/22/2011     Past Surgical  History:  Procedure Laterality Date  . ACHILLES TENDON REPAIR     left  . APPENDECTOMY    . CARDIAC ELECTROPHYSIOLOGY MAPPING AND ABLATION    . COLONOSCOPY    . CORONARY ANGIOPLASTY WITH STENT PLACEMENT     years ago    FHx:    Reviewed / unchanged  SHx:    Reviewed / unchanged   Systems Review:  Constitutional: Denies fever, chills, wt changes, headaches, insomnia, fatigue, night sweats, change in appetite. Eyes: Denies redness, blurred vision, diplopia, discharge, itchy, watery eyes.  ENT: Denies discharge, congestion, post nasal drip, epistaxis, sore throat, earache, hearing loss, dental pain, tinnitus, vertigo, sinus pain, snoring.  CV: Denies chest pain, palpitations, irregular heartbeat, syncope, dyspnea, diaphoresis, orthopnea, PND, claudication or edema. Respiratory: denies cough, dyspnea, DOE, pleurisy, hoarseness, laryngitis, wheezing.  Gastrointestinal: Denies dysphagia, odynophagia, heartburn, reflux, water brash, abdominal pain or cramps, nausea, vomiting, bloating, diarrhea, constipation, hematemesis, melena, hematochezia  or hemorrhoids. Genitourinary: Denies dysuria, frequency, urgency, nocturia, hesitancy, discharge, hematuria or flank pain. Musculoskeletal: Denies arthralgias, myalgias, stiffness, jt. swelling, pain, limping or strain/sprain.  Skin: Denies pruritus, rash, hives, warts, acne, eczema or change in skin lesion(s). Neuro: No weakness, tremor, incoordination, spasms, paresthesia or pain. Psychiatric: Denies confusion, memory loss or sensory loss. Endo: Denies change in weight, skin or hair change.  Heme/Lymph: No excessive bleeding, bruising or enlarged lymph nodes.  Physical Exam  BP 108/60 (BP Location: Right Arm, Patient Position: Sitting, Cuff Size: Normal)   Pulse 73   Temp 97.9 F (36.6 C)   Resp 16   Ht 5\' 10"  (1.778 m)   Wt 218 lb 12.8 oz (99.2 kg)   SpO2 99%   BMI 31.39 kg/m   Appears  well nourished, well groomed  and in no  distress.  Eyes: PERRLA, EOMs, conjunctiva no swelling or erythema. Sinuses: No frontal tenderness, but Lt maxillary tenderness ENT/Mouth: EAC's clear, TM's nl w/o erythema, bulging. Nares clear w/o erythema, swelling, exudates. Oropharynx clear without erythema or exudates. Oral hygiene is good. Tongue normal, non obstructing. Hearing intact.  Neck: Supple. Thyroid not palpable. Car 2+/2+ without bruits, nodes or JVD. Chest: Respirations nl with BS clear & equal w/o rales, rhonchi, wheezing or stridor.  Cor: Heart sounds normal w/ regular rate and rhythm without sig. murmurs, gallops, clicks or rubs. Peripheral pulses normal and equal  without edema.  Abdomen: Soft & bowel sounds normal. Non-tender w/o guarding, rebound, hernias, masses or organomegaly.  Lymphatics: Unremarkable.  Musculoskeletal: Full ROM all peripheral extremities, joint stability, 5/5 strength and normal gait.  Skin: Warm, dry without exposed rashes, lesions or ecchymosis apparent.  Neuro: Cranial nerves intact, reflexes equal bilaterally. Sensory-motor testing grossly intact. Tendon reflexes grossly intact.  Pysch: Alert & oriented x 3.  Insight and judgement nl & appropriate. No ideations.  Assessment and Plan:   1. Essential hypertension   -  Continue medication, monitor blood pressure at home.  - Continue DASH diet. Reminder to go to the ER if any CP,  SOB, nausea, dizziness, severe HA, changes vision/speech.  - CBC with Differential/Platelet - COMPLETE METABOLIC PANEL WITH GFR - Magnesium - TSH  2. Hyperlipidemia, mixed  - Continue diet/meds, exercise,& lifestyle modifications.  - Continue monitor periodic cholesterol/liver & renal functions   - Lipid panel - TSH  3. Abnormal glucose  - Continue diet, exercise  - Lifestyle modifications.  - Monitor appropriate labs  - Hemoglobin A1c - Insulin, random  4. Vitamin D deficiency  - Continue supplementation.  - VITAMIN D 25 Hydroxy    5. Coronary artery disease involving native coronary  artery of native heart without angina pectoris  - Lipid panel  6. Aorto-iliac atherosclerosis ([place]) by Pelvic CT scan 03/2016  - Lipid panel  7. Medication management  - CBC with Differential/Platelet - COMPLETE METABOLIC PANEL WITH GFR - Magnesium - Lipid panel - TSH - Hemoglobin A1c - Insulin, random - VITAMIN D 25 Hydroxy       Discussed  regular exercise, BP monitoring, weight control to achieve/maintain BMI less than 25 and discussed med and SE's. Recommended labs to assess and monitor clinical status with further disposition pending results of labs.       Given Rx to Decadron taper & Doxycycline for suspected sinusitis and painful dental fracture. Also given non-Opioid Gabapentin 600 mg - take 1/2 to 1 tab tid as needed for pain. Discussed meds & SE's with patient.    discussed the assessment and treatment plan with the patient. The patient was provided an opportunity to ask questions and all were answered. The patient agreed with the plan and demonstrated an understanding of the instructions.  I provided over 30 minutes of exam, counseling, chart review and  complex critical decision making.        The patient was advised to call back or seek an in-person evaluation if the symptoms worsen or if the condition fails to improve as anticipated.   Kirtland Bouchard, MD

## 2020-06-11 NOTE — Patient Instructions (Signed)

## 2020-06-12 ENCOUNTER — Other Ambulatory Visit: Payer: Self-pay

## 2020-06-12 ENCOUNTER — Encounter: Payer: Self-pay | Admitting: Internal Medicine

## 2020-06-12 ENCOUNTER — Ambulatory Visit (INDEPENDENT_AMBULATORY_CARE_PROVIDER_SITE_OTHER): Payer: PPO | Admitting: Internal Medicine

## 2020-06-12 VITALS — BP 108/60 | HR 73 | Temp 97.9°F | Resp 16 | Ht 70.0 in | Wt 218.8 lb

## 2020-06-12 DIAGNOSIS — Z79899 Other long term (current) drug therapy: Secondary | ICD-10-CM | POA: Diagnosis not present

## 2020-06-12 DIAGNOSIS — I2510 Atherosclerotic heart disease of native coronary artery without angina pectoris: Secondary | ICD-10-CM

## 2020-06-12 DIAGNOSIS — I700 Atherosclerosis of aorta: Secondary | ICD-10-CM

## 2020-06-12 DIAGNOSIS — I10 Essential (primary) hypertension: Secondary | ICD-10-CM | POA: Diagnosis not present

## 2020-06-12 DIAGNOSIS — I708 Atherosclerosis of other arteries: Secondary | ICD-10-CM

## 2020-06-12 DIAGNOSIS — E559 Vitamin D deficiency, unspecified: Secondary | ICD-10-CM

## 2020-06-12 DIAGNOSIS — E782 Mixed hyperlipidemia: Secondary | ICD-10-CM

## 2020-06-12 DIAGNOSIS — R7309 Other abnormal glucose: Secondary | ICD-10-CM

## 2020-06-12 DIAGNOSIS — J0100 Acute maxillary sinusitis, unspecified: Secondary | ICD-10-CM | POA: Diagnosis not present

## 2020-06-12 MED ORDER — DEXAMETHASONE 4 MG PO TABS: ORAL_TABLET | ORAL | 0 refills | Status: DC

## 2020-06-12 MED ORDER — GABAPENTIN 600 MG PO TABS: ORAL_TABLET | ORAL | 0 refills | Status: DC

## 2020-06-12 MED ORDER — DOXYCYCLINE HYCLATE 100 MG PO CAPS
ORAL_CAPSULE | ORAL | 0 refills | Status: DC
Start: 2020-06-12 — End: 2020-09-27

## 2020-06-12 NOTE — Progress Notes (Signed)
IMPRESSION:   Abnormal study demonstrating: -Right median neuropathy at the wrist consistent with moderate-severe right carpal tunnel syndrome. Penni Bombard, MD  Treatment can be wrist brace to avoid kinking at night or when performing repetitive tasks. Surgical option is PLAN B if this is not effective.

## 2020-06-13 ENCOUNTER — Encounter: Payer: Self-pay | Admitting: *Deleted

## 2020-06-13 LAB — CBC WITH DIFFERENTIAL/PLATELET
Absolute Monocytes: 768 cells/uL (ref 200–950)
Basophils Absolute: 24 cells/uL (ref 0–200)
Basophils Relative: 0.3 %
Eosinophils Absolute: 240 cells/uL (ref 15–500)
Eosinophils Relative: 3 %
HCT: 47 % (ref 38.5–50.0)
Hemoglobin: 15.6 g/dL (ref 13.2–17.1)
Lymphs Abs: 1640 cells/uL (ref 850–3900)
MCH: 29.3 pg (ref 27.0–33.0)
MCHC: 33.2 g/dL (ref 32.0–36.0)
MCV: 88.3 fL (ref 80.0–100.0)
MPV: 11 fL (ref 7.5–12.5)
Monocytes Relative: 9.6 %
Neutro Abs: 5328 cells/uL (ref 1500–7800)
Neutrophils Relative %: 66.6 %
Platelets: 214 10*3/uL (ref 140–400)
RBC: 5.32 10*6/uL (ref 4.20–5.80)
RDW: 11.5 % (ref 11.0–15.0)
Total Lymphocyte: 20.5 %
WBC: 8 10*3/uL (ref 3.8–10.8)

## 2020-06-13 LAB — COMPLETE METABOLIC PANEL WITH GFR
AG Ratio: 1.9 (calc) (ref 1.0–2.5)
ALT: 20 U/L (ref 9–46)
AST: 17 U/L (ref 10–35)
Albumin: 4.3 g/dL (ref 3.6–5.1)
Alkaline phosphatase (APISO): 71 U/L (ref 35–144)
BUN: 11 mg/dL (ref 7–25)
CO2: 29 mmol/L (ref 20–32)
Calcium: 9.2 mg/dL (ref 8.6–10.3)
Chloride: 105 mmol/L (ref 98–110)
Creat: 0.98 mg/dL (ref 0.70–1.18)
GFR, Est African American: 90 mL/min/{1.73_m2} (ref >=60)
GFR, Est Non African American: 78 mL/min/{1.73_m2} (ref >=60)
Globulin: 2.3 g/dL (calc) (ref 1.9–3.7)
Glucose, Bld: 98 mg/dL (ref 65–99)
Potassium: 4.5 mmol/L (ref 3.5–5.3)
Sodium: 139 mmol/L (ref 135–146)
Total Bilirubin: 0.5 mg/dL (ref 0.2–1.2)
Total Protein: 6.6 g/dL (ref 6.1–8.1)

## 2020-06-13 LAB — HEMOGLOBIN A1C
Hgb A1c MFr Bld: 5.4 % of total Hgb (ref <5.7)
Mean Plasma Glucose: 108 mg/dL
eAG (mmol/L): 6 mmol/L

## 2020-06-13 LAB — LIPID PANEL
Cholesterol: 118 mg/dL (ref <200)
HDL: 40 mg/dL (ref >=40)
LDL Cholesterol (Calc): 41 mg/dL (calc)
Non-HDL Cholesterol (Calc): 78 mg/dL (calc) (ref <130)
Total CHOL/HDL Ratio: 3 (calc) (ref <5.0)
Triglycerides: 350 mg/dL — ABNORMAL HIGH (ref <150)

## 2020-06-13 LAB — TSH: TSH: 2.85 mIU/L (ref 0.40–4.50)

## 2020-06-13 LAB — VITAMIN D 25 HYDROXY (VIT D DEFICIENCY, FRACTURES): Vit D, 25-Hydroxy: 62 ng/mL (ref 30–100)

## 2020-06-13 LAB — INSULIN, RANDOM: Insulin: 64.1 u[IU]/mL — ABNORMAL HIGH

## 2020-06-13 LAB — MAGNESIUM: Magnesium: 2 mg/dL (ref 1.5–2.5)

## 2020-06-13 NOTE — Progress Notes (Signed)
============================================================ -   Test results slightly outside the reference range are not unusual. If there is anything important, I will review this with you,  otherwise it is considered normal test values.  If you have further questions,  please do not hesitate to contact me at the office or via My Chart.  ============================================================ ============================================================  -  Total Chol = 118    and    LDL Chol = 41   -  Both    Excellent  ============================================================ ============================================================  - But Triglycerides (  350   ) or fats in blood are too high  (goal is less than 150)    - Recommend avoid fried & greasy foods,  sweets / candy,   - Avoid white rice  (brown or wild rice or Quinoa is OK),   - Avoid white potatoes  (sweet potatoes are OK)   - Avoid anything made from white flour  - bagels, doughnuts, rolls, buns, biscuits, white and   wheat breads, pizza crust and traditional  pasta made of white flour & egg white  - (vegetarian pasta or spinach or wheat pasta is OK).    - Multi-grain bread is OK - like multi-grain flat bread or  sandwich thins.   - Avoid alcohol in excess.   - Exercise is also important.  ============================================================ ============================================================  - A1c = 5.4 Normal - No Diabetes  - Great ! ============================================================ ============================================================  - Vitamin D = 62 - Excellent  ============================================================ ============================================================  - All Else - CBC - Kidneys - Electrolytes - Liver - Magnesium & Thyroid    - all  Normal /  OK ============================================================ ============================================================

## 2020-06-19 ENCOUNTER — Other Ambulatory Visit: Payer: Self-pay | Admitting: Internal Medicine

## 2020-06-23 ENCOUNTER — Other Ambulatory Visit: Payer: Self-pay | Admitting: Cardiovascular Disease

## 2020-07-03 ENCOUNTER — Other Ambulatory Visit: Payer: Self-pay | Admitting: Neurology

## 2020-07-08 ENCOUNTER — Other Ambulatory Visit: Payer: Self-pay | Admitting: Internal Medicine

## 2020-07-18 ENCOUNTER — Other Ambulatory Visit: Payer: Self-pay | Admitting: Cardiovascular Disease

## 2020-07-18 ENCOUNTER — Other Ambulatory Visit: Payer: Self-pay | Admitting: Internal Medicine

## 2020-07-18 DIAGNOSIS — F5101 Primary insomnia: Secondary | ICD-10-CM

## 2020-07-18 MED ORDER — QUVIVIQ 50 MG PO TABS: 50.0000 mg | ORAL_TABLET | Freq: Every day | ORAL | 0 refills | Status: DC

## 2020-08-14 ENCOUNTER — Other Ambulatory Visit: Payer: Self-pay | Admitting: Neurology

## 2020-08-15 MED ORDER — ZOLPIDEM TARTRATE 5 MG PO TABS: 5.0000 mg | ORAL_TABLET | Freq: Every evening | ORAL | 0 refills | Status: DC | PRN

## 2020-08-24 DIAGNOSIS — Z85828 Personal history of other malignant neoplasm of skin: Secondary | ICD-10-CM | POA: Diagnosis not present

## 2020-08-24 DIAGNOSIS — L820 Inflamed seborrheic keratosis: Secondary | ICD-10-CM | POA: Diagnosis not present

## 2020-08-24 DIAGNOSIS — L821 Other seborrheic keratosis: Secondary | ICD-10-CM | POA: Diagnosis not present

## 2020-08-24 DIAGNOSIS — L814 Other melanin hyperpigmentation: Secondary | ICD-10-CM | POA: Diagnosis not present

## 2020-08-24 DIAGNOSIS — D1801 Hemangioma of skin and subcutaneous tissue: Secondary | ICD-10-CM | POA: Diagnosis not present

## 2020-08-24 DIAGNOSIS — L570 Actinic keratosis: Secondary | ICD-10-CM | POA: Diagnosis not present

## 2020-08-27 ENCOUNTER — Other Ambulatory Visit: Payer: Self-pay

## 2020-08-27 DIAGNOSIS — N529 Male erectile dysfunction, unspecified: Secondary | ICD-10-CM

## 2020-08-27 MED ORDER — SILDENAFIL CITRATE 100 MG PO TABS: ORAL_TABLET | ORAL | 1 refills | Status: DC

## 2020-09-04 ENCOUNTER — Ambulatory Visit: Payer: PPO | Admitting: Internal Medicine

## 2020-09-17 ENCOUNTER — Other Ambulatory Visit: Payer: Self-pay | Admitting: Neurology

## 2020-09-18 ENCOUNTER — Other Ambulatory Visit: Payer: Self-pay | Admitting: Internal Medicine

## 2020-09-21 ENCOUNTER — Other Ambulatory Visit: Payer: Self-pay

## 2020-09-21 DIAGNOSIS — I2510 Atherosclerotic heart disease of native coronary artery without angina pectoris: Secondary | ICD-10-CM

## 2020-09-21 DIAGNOSIS — I10 Essential (primary) hypertension: Secondary | ICD-10-CM

## 2020-09-21 MED ORDER — NEBIVOLOL HCL 10 MG PO TABS: ORAL_TABLET | ORAL | 1 refills | Status: DC

## 2020-09-25 ENCOUNTER — Ambulatory Visit: Payer: PPO | Admitting: Internal Medicine

## 2020-09-26 NOTE — Progress Notes (Signed)
MEDICARE ANNUAL WELLNESS VISIT AND FOLLOW UP Assessment:   Diagnoses and all orders for this visit:  Encounter for Medicare annual wellness exam Due Annually  Aortoiliac atherosclerosis ([place]) Per CT 2018 Control blood pressure, cholesterol, glucose, increase exercise.   Symptomatic PVCs Has very rarely; symptoms stable recently.  Continue with medications, followed by cardiology.   Essential hypertension BPs at goal  Monitor blood pressure at home; call if consistently over 130/80 Continue DASH diet.   Reminder to go to the ER if any CP, SOB, nausea, dizziness, severe HA, changes vision/speech, left arm numbness and tingling and jaw pain. CBC  History of PSVT Followed by cardiology  Coronary artery disease involving native coronary artery of native heart, angina presence unspecified Control blood pressure, cholesterol, glucose, increase exercise.  Followed by cardiology  OSA (obstructive sleep apnea) Followed by Dr. Brett Fairy; recent sleep study recommended CPAP, pending titration  CKD (chronic kidney disease) stage 2, GFR 60-89 ml/min Increase fluids, avoid NSAIDS, monitor sugars, will monitor Microalbumin/creatinine urine ratio  Vitamin D deficiency Continue supplementation for goal of 70-100 Defer vitamin D level  Other abnormal glucose Recent A1Cs at goal Discussed diet/exercise, weight management  Defer A1C; check CMP  Overweight (25-29.9) Long discussion about weight loss, diet, and exercise Recommended diet heavy in fruits and veggies and low in animal meats, cheeses, and dairy products, appropriate calorie intake Discussed appropriate weight for height  Patient on phentermine with benefit and no SE, taking drug breaks;  Follow up at next visit  Medication management CBC, CMP/GFR  Primary insomnia Followed by Dr. Brett Fairy, prescribed Eulis Canner sleep hygiene discussed, increase day time activity Continue with careful use of sleep aid  agents  Mixed hyperlipidemia Triglycerides remain signicantly elevated; recently increased rosuvastatin to 20 mg, titrate up as needed, reduce creamer and sugar in coffee Continue statin/zetia Continue low cholesterol diet and exercise.  Check lipid panel.   Erectile dysfunction Continue cialis PRN  Over 30 minutes of exam, counseling, chart review, and critical decision making was performed  Future Appointments  Date Time Provider [place]  10/02/2020  9:20 AM Sherren Mocha, MD [facility]  11/21/2020  9:30 AM Dohmeier, Asencion Partridge, MD [facility] None  02/26/2021  2:00 PM [name] Pinto, MD [facility] None  09/30/2021  3:30 PM Magda Bernheim, NP [facility] None    Plan:   During the course of the visit the patient was educated and counseled about appropriate screening and preventive services including:   Pneumococcal vaccine  Influenza vaccine Prevnar 13 Td vaccine Screening electrocardiogram Colorectal cancer screening Diabetes screening Glaucoma screening Nutrition counseling    Subjective:  Bryan [name] is a 70 y.o. male who presents for Medicare Annual Wellness Visit and 3 month follow up for HTN, hyperlipidemia, glucose management, and vitamin D Def.   He has ongoing difficulties with sleep, o Dr. Brett Fairy had sleep study which showed severe OSA and recommended CPAP. She is prescribing ambien.  He has depression, recently doing well with wellbutrin 150 mg daily and reports working on lifestyle and feels has improved significantly. Marland Kitchen   He reports back and hip pain continues to have intermittently , gabapenitn does help. BMI is Body mass index is 31.08 kg/m., he has been working on diet and exercise. Doing stretching exercises, walking, calisthenics, redoing yard, does a lot of outdoor activities, boating, etc.  Watching white starchy foods, minimal greasy foods, limited red meat. Does poultry, fish. Lots of fruit, nuts, beans. Does mainly whole  grain.  Tried phentermine/topiramate,  some benefit, takes as needed if gaining weight. Weight down from 221 lb, 204 lb on home scale.  Wt Readings from Last 3 Encounters:  09/27/20 216 lb 9.6 oz (98.2 kg)  06/12/20 218 lb 12.8 oz (99.2 kg)  05/24/20 217 lb (98.4 kg)   He has hx of PSVT/PVCs but recently improved without episodes in several years.  Patient has hx/o ASCAD with ACS/acute Anterior NSTEMI and PCA w/Stent implantation in 2003. He is on plavix. In 2004 , he had an Ablation for pAfib. Stress Myoview was negative in 2010. Continues on plavix. Recently transitioned to seeing Dr. Burt Knack.   His blood pressure has been controlled at home, today their BP is BP: 100/60 BP Readings from Last 3 Encounters:  09/27/20 100/60  06/12/20 108/60  05/24/20 106/71    He does workout. He denies chest pain, shortness of breath, dizziness.   He is on cholesterol medication (zetia 10 mg daily, newly increased to rosuvatation 20 mg this week by cardiology, previously 10 mg, omega 3) and denies myalgias. His cholesterol is not at goal. The cholesterol last visit was:    Lab Results  Component Value Date   CHOL 118 06/12/2020   HDL 40 06/12/2020   LDLCALC 41 06/12/2020   LDLDIRECT 59 11/07/2019   TRIG 350 (H) 06/12/2020   CHOLHDL 3.0 06/12/2020   He has been working on diet and exercise for glucose management, and denies foot ulcerations, increased appetite, nausea, paresthesia of the feet, polydipsia, polyuria, visual disturbances, vomiting and weight loss. Last A1C in the office was:  Lab Results  Component Value Date   HGBA1C 5.4 06/12/2020   Last GFR Lab Results  Component Value Date   GFRNONAA 78 06/12/2020    Patient is on Vitamin D supplement and near 60:    Lab Results  Component Value Date   VD25OH 62 06/12/2020        Current Outpatient Medications on File Prior to Visit  Medication Sig Dispense Refill   buPROPion (WELLBUTRIN XL) 150 MG 24 hr tablet Take  1 tablet  Daily   for Mood, Focus & Concentration 90 tablet 3   Cholecalciferol (VITAMIN D) 50 MCG (2000 UT) CAPS Take by mouth.     clopidogrel (PLAVIX) 75 MG tablet Take  1 tablet  Daily  to Prevent Blood Clots 90 tablet 3   ezetimibe (ZETIA) 10 MG tablet Take  1 tablet  Daily  for Cholesterol 90 tablet 3   gabapentin (NEURONTIN) 600 MG tablet TAKE 1/2 TO 1 TABLET BY MOUTH TWO TO THREE TIMES A DAY AS NEEDED FOR PAIN 270 tablet 0   nebivolol (BYSTOLIC) 10 MG tablet TAKE ONE TABLET BY MOUTH DAILY FOR FOR BLOOD PRESSURE 90 tablet 1   nitroGLYCERIN (NITROSTAT) 0.4 MG SL tablet Dissolve 1 tablet under tongue every 3 to 5 minutes if needed for Angina 50 tablet 11   phentermine (ADIPEX-P) 37.5 MG tablet Take 1/2 to 1 tablet every Morning for Dieting & Weight Loss 90 tablet 1   rosuvastatin (CRESTOR) 20 MG tablet Take 1 tablet (20 mg total) by mouth daily. Please keep upcoming appt in December 2022 with Cardiologist before anymore refills. Thank you Final Attempt 90 tablet 1   sildenafil (VIAGRA) 100 MG tablet TAKE 1/2 TO 1 TABLET BY MOUTH 1 HOUR BEFORE AS NEEDED 30 tablet 1   valACYclovir (VALTREX) 500 MG tablet Take 1 tablet 2 x /day for Fever Blisters / Cold Sores 180 tablet 1   zolpidem (AMBIEN) 5  MG tablet TAKE ONE TABLET BY MOUTH EVERY EVENING AS NEEDED FOR SLEEP 30 tablet 2   No current facility-administered medications on file prior to visit.     Allergies: Allergies  Allergen Reactions   Lisinopril Cough    ACEi cough   Amoxicillin Hives and Swelling   Penicillins Swelling    Current Problems (verified) has Hyperlipidemia, mixed; Essential hypertension; Vitamin D deficiency; Abnormal glucose; Medication management; Coronary artery disease involving native coronary artery of native heart without angina pectoris; History of PSVT; Symptomatic PVCs; CKD (chronic kidney disease) stage 2, GFR 60-89 ml/min; Obesity (BMI 30.0-34.9); Insomnia; ED (erectile dysfunction); Aorto-iliac atherosclerosis ([place]) by Pelvic  CT scan 03/2016; Psychophysiological insomnia; Dental anomaly; Severe obstructive sleep apnea-hypopnea syndrome; OSA on CPAP; Chronic intermittent hypoxia with obstructive sleep apnea; and Intolerance of continuous positive airway pressure (CPAP) ventilation on their problem list.  Screening Tests Immunization History  Administered Date(s) Administered   Influenza Split 10/13/2013, 10/23/2014   Influenza, High Dose Seasonal PF 01/09/2017, 10/27/2017, 11/21/2019   Influenza,inj,quad, With Preservative 01/13/2013   Influenza-Unspecified 10/07/2011   PPD Test 10/13/2013, 10/23/2014   Pneumococcal Conjugate-13 10/13/2013   Pneumococcal Polysaccharide-23 10/23/2014, 02/21/2020   Td 01/06/2006   Tdap 03/06/2016   Zoster, Live 09/22/2011   Preventative care: Last colonoscopy: 05/2018 next due 05/2021 CXR 2019 MRI lumbar/hip 2017 Sleep study 04/2019  Prior vaccinations: TD or Tdap: 2018  Influenza:  Pneumococcal:02/21/20 Prevnar13: 2015 Shingles/Zostavax: 2013 Covid 19: 2/2, 2021, moderna   Names of Other Physician/Practitioners you currently use: 1. [facility] here for primary care 2. Lens, eye doctor, last visit 2022 3. Turner, dentist, last visit 2022  Patient Care Team: [name] Pinto, MD as PCP - General (Internal Medicine) Sherren Mocha, MD as PCP - Cardiology (Cardiology)  Surgical: He  has a past surgical history that includes Appendectomy; Achilles tendon repair; Cardiac electrophysiology mapping and ablation; Coronary angioplasty with stent; and Colonoscopy. Family His family history includes Brain cancer in his father; Colon polyps in his mother; Colonic polyp in his mother; Hypothyroidism in his mother; Irritable bowel syndrome in his mother. Social history  He reports that he has never smoked. He has never used smokeless tobacco. He reports current alcohol use. He reports that he does not use drugs.  MEDICARE WELLNESS  OBJECTIVES: Physical activity: Current Exercise Habits: Structured exercise class, Type of exercise: strength training/weights;walking, Time (Minutes): 50, Frequency (Times/Week): 4, Weekly Exercise (Minutes/Week): 200, Intensity: Mild, Exercise limited by: None identified Cardiac risk factors: Cardiac Risk Factors include: dyslipidemia;advanced age (>34men, >40 women);male gender Depression/mood screen:   Depression screen [facility] 2/9 09/27/2020  Decreased Interest 0  Down, Depressed, Hopeless 0  PHQ - 2 Score 0  Altered sleeping -  Tired, decreased energy -  Change in appetite -  Feeling bad or failure about yourself  -  Trouble concentrating -  Moving slowly or fidgety/restless -  Suicidal thoughts -  PHQ-9 Score -  Difficult doing work/chores -    ADLs:  In your present state of health, do you have any difficulty performing the following activities: 09/27/2020 02/20/2020  Hearing? N N  Vision? N N  Difficulty concentrating or making decisions? N N  Walking or climbing stairs? N N  Dressing or bathing? N N  Doing errands, shopping? N N  Some recent data might be hidden     Cognitive Testing  Alert? Yes  Normal Appearance?Yes  Oriented to person? Yes  Place? Yes   Time? Yes  Recall of three  objects?  Yes  Can perform simple calculations? Yes  Displays appropriate judgment?Yes  Can read the correct time from a watch face?Yes  EOL planning: Does Patient Have a Medical Advance Directive?: Yes Type of Advance Directive: Healthcare Power of Attorney, Living will Does patient want to make changes to medical advance directive?: No - Patient declined Copy of [place] in Chart?: No - copy requested  Review of Systems  Constitutional:  Negative for chills, fever and weight loss.  HENT:  Negative for congestion and hearing loss.   Eyes:  Negative for blurred vision and double vision.  Respiratory:  Negative for cough and shortness of breath.   Cardiovascular:   Negative for chest pain, palpitations, orthopnea and leg swelling.  Gastrointestinal:  Negative for abdominal pain, constipation, diarrhea, heartburn, nausea and vomiting.  Musculoskeletal:  Negative for falls, joint pain and myalgias.  Skin:  Negative for rash.  Neurological:  Negative for dizziness, tingling, tremors, loss of consciousness and headaches.  Psychiatric/Behavioral:  Negative for depression, memory loss and suicidal ideas.     Objective:   Today's Vitals   09/27/20 1514  BP: 100/60  Pulse: 80  Temp: (!) 97.5 F (36.4 C)  SpO2: 94%  Weight: 216 lb 9.6 oz (98.2 kg)    Body mass index is 31.08 kg/m.  General Appearance: Well nourished, in no apparent distress. Eyes: PERRLA, EOMs, conjunctiva no swelling or erythema Sinuses: No Frontal/maxillary tenderness ENT/Mouth: Ext aud canals clear, TMs without erythema, bulging. No erythema, swelling, or exudate on post pharynx.  Tonsils not swollen or erythematous. Hearing normal.  Neck: Supple, thyroid normal.  Respiratory: Respiratory effort normal, BS equal bilaterally without rales, rhonchi, wheezing or stridor.  Cardio: RRR with no MRGs. Brisk peripheral pulses without edema.  Abdomen: Soft, + BS.  Non tender, no guarding, rebound, hernias, masses. Lymphatics: Non tender without lymphadenopathy.  Musculoskeletal: Full ROM, 5/5 strength, Normal gait.  Skin: Warm, dry without rashes, lesions, ecchymosis.  Neuro: Cranial nerves intact. No cerebellar symptoms.  Psych: Awake and oriented X 3, normal affect, Insight and Judgment appropriate.   Medicare Attestation I have personally reviewed: The patient's medical and social history Their use of alcohol, tobacco or illicit drugs Their current medications and supplements The patient's functional ability including ADLs,fall risks, home safety risks, cognitive, and hearing and visual impairment Diet and physical activities Evidence for depression or mood disorders  The  patient's weight, height, BMI, and visual acuity have been recorded in the chart.  I have made referrals, counseling, and provided education to the patient based on review of the above and I have provided the patient with a written personalized care plan for preventive services.     [facility]  09/27/2020

## 2020-09-27 ENCOUNTER — Encounter: Payer: Self-pay | Admitting: Nurse Practitioner

## 2020-09-27 ENCOUNTER — Other Ambulatory Visit: Payer: Self-pay

## 2020-09-27 ENCOUNTER — Ambulatory Visit (INDEPENDENT_AMBULATORY_CARE_PROVIDER_SITE_OTHER): Payer: PPO | Admitting: Nurse Practitioner

## 2020-09-27 VITALS — BP 100/60 | HR 80 | Temp 97.5°F | Wt 216.6 lb

## 2020-09-27 DIAGNOSIS — G4733 Obstructive sleep apnea (adult) (pediatric): Secondary | ICD-10-CM | POA: Diagnosis not present

## 2020-09-27 DIAGNOSIS — F5101 Primary insomnia: Secondary | ICD-10-CM

## 2020-09-27 DIAGNOSIS — I700 Atherosclerosis of aorta: Secondary | ICD-10-CM

## 2020-09-27 DIAGNOSIS — Z0001 Encounter for general adult medical examination with abnormal findings: Secondary | ICD-10-CM | POA: Diagnosis not present

## 2020-09-27 DIAGNOSIS — E559 Vitamin D deficiency, unspecified: Secondary | ICD-10-CM

## 2020-09-27 DIAGNOSIS — I10 Essential (primary) hypertension: Secondary | ICD-10-CM

## 2020-09-27 DIAGNOSIS — I2510 Atherosclerotic heart disease of native coronary artery without angina pectoris: Secondary | ICD-10-CM

## 2020-09-27 DIAGNOSIS — E782 Mixed hyperlipidemia: Secondary | ICD-10-CM

## 2020-09-27 DIAGNOSIS — Z0000 Encounter for general adult medical examination without abnormal findings: Secondary | ICD-10-CM

## 2020-09-27 DIAGNOSIS — R6889 Other general symptoms and signs: Secondary | ICD-10-CM | POA: Diagnosis not present

## 2020-09-27 DIAGNOSIS — I471 Supraventricular tachycardia: Secondary | ICD-10-CM | POA: Diagnosis not present

## 2020-09-27 DIAGNOSIS — Z23 Encounter for immunization: Secondary | ICD-10-CM

## 2020-09-27 DIAGNOSIS — R7309 Other abnormal glucose: Secondary | ICD-10-CM

## 2020-09-27 DIAGNOSIS — Z79899 Other long term (current) drug therapy: Secondary | ICD-10-CM

## 2020-09-27 DIAGNOSIS — N529 Male erectile dysfunction, unspecified: Secondary | ICD-10-CM

## 2020-09-27 DIAGNOSIS — N182 Chronic kidney disease, stage 2 (mild): Secondary | ICD-10-CM | POA: Diagnosis not present

## 2020-09-27 NOTE — Patient Instructions (Signed)

## 2020-09-28 LAB — CBC WITH DIFFERENTIAL/PLATELET
Absolute Monocytes: 630 cells/uL (ref 200–950)
Basophils Absolute: 42 cells/uL (ref 0–200)
Basophils Relative: 0.6 %
Eosinophils Absolute: 259 cells/uL (ref 15–500)
Eosinophils Relative: 3.7 %
HCT: 48.3 % (ref 38.5–50.0)
Hemoglobin: 15.7 g/dL (ref 13.2–17.1)
Lymphs Abs: 2051 cells/uL (ref 850–3900)
MCH: 29 pg (ref 27.0–33.0)
MCHC: 32.5 g/dL (ref 32.0–36.0)
MCV: 89.3 fL (ref 80.0–100.0)
MPV: 11 fL (ref 7.5–12.5)
Monocytes Relative: 9 %
Neutro Abs: 4018 cells/uL (ref 1500–7800)
Neutrophils Relative %: 57.4 %
Platelets: 239 10*3/uL (ref 140–400)
RBC: 5.41 10*6/uL (ref 4.20–5.80)
RDW: 12.4 % (ref 11.0–15.0)
Total Lymphocyte: 29.3 %
WBC: 7 10*3/uL (ref 3.8–10.8)

## 2020-09-28 LAB — MICROALBUMIN / CREATININE URINE RATIO
Creatinine, Urine: 169 mg/dL (ref 20–320)
Microalb Creat Ratio: 4 mcg/mg creat (ref <30)
Microalb, Ur: 0.7 mg/dL

## 2020-09-28 LAB — COMPLETE METABOLIC PANEL WITH GFR
AG Ratio: 2 (calc) (ref 1.0–2.5)
ALT: 23 U/L (ref 9–46)
AST: 20 U/L (ref 10–35)
Albumin: 4.5 g/dL (ref 3.6–5.1)
Alkaline phosphatase (APISO): 63 U/L (ref 35–144)
BUN: 12 mg/dL (ref 7–25)
CO2: 29 mmol/L (ref 20–32)
Calcium: 9.5 mg/dL (ref 8.6–10.3)
Chloride: 104 mmol/L (ref 98–110)
Creat: 1.14 mg/dL (ref 0.70–1.28)
Globulin: 2.2 g/dL (calc) (ref 1.9–3.7)
Glucose, Bld: 75 mg/dL (ref 65–99)
Potassium: 4.3 mmol/L (ref 3.5–5.3)
Sodium: 139 mmol/L (ref 135–146)
Total Bilirubin: 1 mg/dL (ref 0.2–1.2)
Total Protein: 6.7 g/dL (ref 6.1–8.1)
eGFR: 69 mL/min/{1.73_m2} (ref >=60)

## 2020-09-28 LAB — LIPID PANEL
Cholesterol: 134 mg/dL (ref <200)
HDL: 44 mg/dL (ref >=40)
LDL Cholesterol (Calc): 61 mg/dL (calc)
Non-HDL Cholesterol (Calc): 90 mg/dL (calc) (ref <130)
Total CHOL/HDL Ratio: 3 (calc) (ref <5.0)
Triglycerides: 231 mg/dL — ABNORMAL HIGH (ref <150)

## 2020-10-02 ENCOUNTER — Encounter: Payer: Self-pay | Admitting: Cardiovascular Disease

## 2020-10-02 ENCOUNTER — Other Ambulatory Visit: Payer: Self-pay

## 2020-10-02 ENCOUNTER — Ambulatory Visit: Payer: PPO | Admitting: Cardiovascular Disease

## 2020-10-02 ENCOUNTER — Ambulatory Visit (INDEPENDENT_AMBULATORY_CARE_PROVIDER_SITE_OTHER): Payer: PPO

## 2020-10-02 ENCOUNTER — Telehealth: Payer: Self-pay | Admitting: *Deleted

## 2020-10-02 VITALS — BP 108/72 | HR 76 | Ht 70.0 in | Wt 217.0 lb

## 2020-10-02 DIAGNOSIS — I2510 Atherosclerotic heart disease of native coronary artery without angina pectoris: Secondary | ICD-10-CM | POA: Diagnosis not present

## 2020-10-02 DIAGNOSIS — I10 Essential (primary) hypertension: Secondary | ICD-10-CM

## 2020-10-02 DIAGNOSIS — E782 Mixed hyperlipidemia: Secondary | ICD-10-CM

## 2020-10-02 DIAGNOSIS — R002 Palpitations: Secondary | ICD-10-CM

## 2020-10-02 DIAGNOSIS — R0602 Shortness of breath: Secondary | ICD-10-CM | POA: Diagnosis not present

## 2020-10-02 NOTE — Progress Notes (Unsigned)
Patient enrolled for [employer] to mail a 14 day ZIO XT monitor to his address on file.

## 2020-10-02 NOTE — Telephone Encounter (Signed)
-----   Message from Jennefer Bravo sent at 10/02/2020 10:28 AM EDT ----- Regarding: RE: 14 day zio done ----- Message ----- From: Nuala Alpha, LPN Sent: 01/14/3233  10:05 AM EDT To: Kendall Flack Subject: 14 day zio                                     Dr. Burt Knack saw this pt in clinic this morning and wants him to have a 14 day zio for palp. Please enroll and let me know.  Thanks, [employer]

## 2020-10-02 NOTE — Patient Instructions (Signed)
Medication Instructions:   Your physician recommends that you continue on your current medications as directed. Please refer to the Current Medication list given to you today.  *If you need a refill on your cardiac medications before your next appointment, please call your pharmacy*   Testing/Procedures:  Your physician has requested that you have an echocardiogram. Echocardiography is a painless test that uses sound waves to create images of your heart. It provides your doctor with information about the size and shape of your heart and how well your heart's chambers and valves are working. This procedure takes approximately one hour. There are no restrictions for this procedure.   ZIO XT- Long Term Monitor Instructions  Your physician has requested you wear a ZIO patch monitor for 14 days.  This is a single patch monitor. [employer] supplies one patch monitor per enrollment. Additional stickers are not available. Please do not apply patch if you will be having a Nuclear Stress Test,  Echocardiogram, Cardiac CT, MRI, or Chest Xray during the period you would be wearing the  monitor. The patch cannot be worn during these tests. You cannot remove and re-apply the  ZIO XT patch monitor.  Your ZIO patch monitor will be mailed 3 day USPS to your address on file. It may take 3-5 days  to receive your monitor after you have been enrolled.  Once you have received your monitor, please review the enclosed instructions. Your monitor  has already been registered assigning a specific monitor serial # to you.  Billing and Patient Assistance Program Information  We have supplied [employer] with any of your insurance information on file for billing purposes. [employer] offers a sliding scale Patient Assistance Program for patients that do not have  insurance, or whose insurance does not completely cover the cost of the ZIO monitor.  You must apply for the Patient Assistance Program to qualify for this  discounted rate.  To apply, please call [employer] at [phone], select option 4, select option 2, ask to apply for  Patient Assistance Program. Theodore Demark will ask your household income, and how many people  are in your household. They will quote your out-of-pocket cost based on that information.  [employer] will also be able to set up a 45-month interest-free payment plan if needed.  Applying the monitor   Shave hair from upper left chest.  Hold abrader disc by orange tab. Rub abrader in 40 strokes over the upper left chest as  indicated in your monitor instructions.  Clean area with 4 enclosed alcohol pads. Let dry.  Apply patch as indicated in monitor instructions. Patch will be placed under collarbone on left  side of chest with arrow pointing upward.  Rub patch adhesive wings for 2 minutes. Remove white label marked "1". Remove the white  label marked "2". Rub patch adhesive wings for 2 additional minutes.  While looking in a mirror, press and release button in center of patch. A small green light will  flash 3-4 times. This will be your only indicator that the monitor has been turned on.  Do not shower for the first 24 hours. You may shower after the first 24 hours.  Press the button if you feel a symptom. You will hear a small click. Record Date, Time and  Symptom in the Patient Logbook.  When you are ready to remove the patch, follow instructions on the last 2 pages of Patient  Logbook. Stick patch monitor onto the last page of Patient Logbook.  Place Patient  Logbook in the blue and white box. Use locking tab on box and tape box closed  securely. The blue and white box has prepaid postage on it. Please place it in the mailbox as  soon as possible. Your physician should have your test results approximately 7 days after the  monitor has been mailed back to [facility].  Call [facility] at [phone] if you have questions regarding  your ZIO XT patch monitor. Call  them immediately if you see an orange light blinking on your  monitor.  If your monitor falls off in less than 4 days, contact our Monitor department at [phone].  If your monitor becomes loose or falls off after 4 days call [employer] at [phone] for  suggestions on securing your monitor    Follow-Up: At [facility], you and your health needs are our priority.  As part of our continuing mission to provide you with exceptional heart care, we have created designated Provider Care Teams.  These Care Teams include your primary Cardiologist (physician) and Advanced Practice Providers (APPs -  Physician Assistants and Nurse Practitioners) who all work together to provide you with the care you need, when you need it.  We recommend signing up for the patient portal called "MyChart".  Sign up information is provided on this After Visit Summary.  MyChart is used to connect with patients for Virtual Visits (Telemedicine).  Patients are able to view lab/test results, encounter notes, upcoming appointments, etc.  Non-urgent messages can be sent to your provider as well.   To learn more about what you can do with MyChart, go to [email or web address].    Your next appointment:   1 year(s)  The format for your next appointment:   In Person  Provider:   Sherren Mocha, MD

## 2020-10-02 NOTE — Progress Notes (Signed)
Cardiology Office Note:    Date:  10/02/2020   ID:  Jt, Brabec 05-Dec-1950, MRN [id number]  PCP:  [name] Pinto, MD   [facility] Providers Cardiologist:  Sherren Mocha, MD     Referring MD: [name] Pinto, MD   Chief Complaint  Patient presents with   Shortness of Breath   Coronary Artery Disease     History of Present Illness:    Bryan [name] is a 70 y.o. male with a hx of coronary artery disease, presenting for follow-up evaluation.  The patient initially presented in 2003 with ACS. He was found to have critical stenosis of the first OM branch of the circumflex and underwent PCI with a bare metal stent at that time. He was noted to have severe apical LAD stenosis and moderate 60-70% RCA stenoses associated with areas of aneurysmal dilatation. LV function was normal with LVEF 65%. Comorbid medical conditions include sleep apnea, HTN, and mixed dyslipidemia. the patient also has a remote hx of 'tachycardia' and reports an ablation over 20 years ago by Dr Ola Spurr at [facility]. The patient is here alone today. He's been experiencing exertional dyspnea and mild wheezing with physical activity. Also complaining of 'skipped beats' and heart palpitations.     Past Medical History:  Diagnosis Date   Allergy    CAD (coronary artery disease), native coronary artery    Cath 04/19/2001  normal Left main, moderate proximal disease, 80% stenosis distal LAD, 99% stenosis proximal OM 1, 60% stenosis mid RCA, 70% stenosis ostial mid PDA  PCI with bare metal stent of OM1 same date 3.5 x 18 mm Multilink     Diverticulosis    GERD (gastroesophageal reflux disease)    Hernia    History of PSVT    Ablation done in 2004 at [facility]    Hyperlipidemia    Hypertension    Sleep apnea    Symptomatic PVCs    Vitamin D deficiency     Past Surgical History:  Procedure Laterality Date   ACHILLES TENDON REPAIR     left   APPENDECTOMY     CARDIAC ELECTROPHYSIOLOGY  MAPPING AND ABLATION     COLONOSCOPY     CORONARY ANGIOPLASTY WITH STENT PLACEMENT     years ago    Current Medications: Current Meds  Medication Sig   buPROPion (WELLBUTRIN XL) 150 MG 24 hr tablet Take  1 tablet  Daily  for Mood, Focus & Concentration   Cholecalciferol (VITAMIN D) 50 MCG (2000 UT) CAPS Take by mouth.   clopidogrel (PLAVIX) 75 MG tablet Take  1 tablet  Daily  to Prevent Blood Clots   ezetimibe (ZETIA) 10 MG tablet Take  1 tablet  Daily  for Cholesterol   gabapentin (NEURONTIN) 600 MG tablet TAKE 1/2 TO 1 TABLET BY MOUTH TWO TO THREE TIMES A DAY AS NEEDED FOR PAIN   nebivolol (BYSTOLIC) 10 MG tablet TAKE ONE TABLET BY MOUTH DAILY FOR FOR BLOOD PRESSURE   nitroGLYCERIN (NITROSTAT) 0.4 MG SL tablet Dissolve 1 tablet under tongue every 3 to 5 minutes if needed for Angina   phentermine (ADIPEX-P) 37.5 MG tablet Take 1/2 to 1 tablet every Morning for Dieting & Weight Loss   rosuvastatin (CRESTOR) 20 MG tablet Take 1 tablet (20 mg total) by mouth daily. Please keep upcoming appt in December 2022 with Cardiologist before anymore refills. Thank you Final Attempt   telmisartan (MICARDIS) 80 MG tablet Take 80 mg by mouth daily.  zolpidem (AMBIEN) 5 MG tablet TAKE ONE TABLET BY MOUTH EVERY EVENING AS NEEDED FOR SLEEP     Allergies:   Lisinopril, Amoxicillin, and Penicillins   Social History   Socioeconomic History   Marital status: Married    Spouse name: Not on file   Number of children: 3   Years of education: Not on file   Highest education level: Not on file  Occupational History   Not on file  Tobacco Use   Smoking status: Never   Smokeless tobacco: Never  Vaping Use   Vaping Use: Never used  Substance and Sexual Activity   Alcohol use: Yes    Comment: rarely   Drug use: No   Sexual activity: Not on file  Other Topics Concern   Not on file  Social History Narrative   Drinks 2 cups of coffee a day    Social Determinants of Health   Financial Resource  Strain: Not on file  Food Insecurity: Not on file  Transportation Needs: Not on file  Physical Activity: Not on file  Stress: Not on file  Social Connections: Not on file     Family History: The patient's family history includes Brain cancer in his father; Colon polyps in his mother; Colonic polyp in his mother; Hypothyroidism in his mother; Irritable bowel syndrome in his mother. There is no history of Esophageal cancer, Stomach cancer, Pancreatic cancer, Liver disease, Colon cancer, or Rectal cancer.  ROS:   Please see the history of present illness.    All other systems reviewed and are negative.  EKGs/Labs/Other Studies Reviewed:    EKG:  EKG is ordered today.  The ekg ordered today demonstrates NSR 76 bpm, within normal limits  Recent Labs: 06/12/2020: Magnesium 2.0; TSH 2.85 09/27/2020: ALT 23; BUN 12; Creat 1.14; Hemoglobin 15.7; Platelets 239; Potassium 4.3; Sodium 139  Recent Lipid Panel    Component Value Date/Time   CHOL 134 09/27/2020 1604   CHOL 134 11/07/2019 0820   TRIG 231 (H) 09/27/2020 1604   HDL 44 09/27/2020 1604   HDL 51 11/07/2019 0820   CHOLHDL 3.0 09/27/2020 1604   VLDL 49 (H) 06/05/2016 0938   LDLCALC 61 09/27/2020 1604   LDLDIRECT 59 11/07/2019 0820     Risk Assessment/Calculations:           Physical Exam:    VS:  BP 108/72   Pulse 76   Ht 5\' 10"  (1.778 m)   Wt 217 lb (98.4 kg)   SpO2 98%   BMI 31.14 kg/m     Wt Readings from Last 3 Encounters:  10/02/20 217 lb (98.4 kg)  09/27/20 216 lb 9.6 oz (98.2 kg)  06/12/20 218 lb 12.8 oz (99.2 kg)     GEN:  Well nourished, well developed in no acute distress HEENT: Normal NECK: No JVD; No carotid bruits LYMPHATICS: No lymphadenopathy CARDIAC: RRR, no murmurs, rubs, gallops RESPIRATORY:  Clear to auscultation without rales, wheezing or rhonchi  ABDOMEN: Soft, non-tender, non-distended MUSCULOSKELETAL:  No edema; No deformity  SKIN: Warm and dry NEUROLOGIC:  Alert and oriented x  3 PSYCHIATRIC:  Normal affect   ASSESSMENT:    1. Coronary artery disease involving native coronary artery of native heart without angina pectoris   2. Essential hypertension   3. Mixed hyperlipidemia   4. SOB (shortness of breath)   5. Palpitations    PLAN:    In order of problems listed above:  Appears stable.  Managed with clopidogrel, Bystolic, and rosuvastatin.  No anginal symptoms. Blood pressure well controlled on Bystolic and Micardis Lipids reviewed with recent cholesterol 134, HDL 44, LDL 61, triglycerides 231.  Lifestyle modification discussed.  Other labs look good with a hemoglobin A1c of 5.4 and an ALT of 23. Mild wheezing in the left lower lobe noted.  We will check an echo to make sure no cardiac contribution.  As long as his cardiac testing looks okay, would follow-up with primary care to see if concerns over allergies or asthma.  Also deconditioning may play a role. Remote history of SVT and cardiac ablation.  Symptoms now sound more like symptomatic PVCs.  We will check an outpatient event monitor.  Medication Adjustments/Labs and Tests Ordered: Current medicines are reviewed at length with the patient today.  Concerns regarding medicines are outlined above.  Orders Placed This Encounter  Procedures   LONG TERM MONITOR (3-14 DAYS)   EKG 12-Lead   ECHOCARDIOGRAM COMPLETE    No orders of the defined types were placed in this encounter.   Patient Instructions  Medication Instructions:   Your physician recommends that you continue on your current medications as directed. Please refer to the Current Medication list given to you today.  *If you need a refill on your cardiac medications before your next appointment, please call your pharmacy*   Testing/Procedures:  Your physician has requested that you have an echocardiogram. Echocardiography is a painless test that uses sound waves to create images of your heart. It provides your doctor with information about  the size and shape of your heart and how well your heart's chambers and valves are working. This procedure takes approximately one hour. There are no restrictions for this procedure.   ZIO XT- Long Term Monitor Instructions  Your physician has requested you wear a ZIO patch monitor for 14 days.  This is a single patch monitor. [employer] supplies one patch monitor per enrollment. Additional stickers are not available. Please do not apply patch if you will be having a Nuclear Stress Test,  Echocardiogram, Cardiac CT, MRI, or Chest Xray during the period you would be wearing the  monitor. The patch cannot be worn during these tests. You cannot remove and re-apply the  ZIO XT patch monitor.  Your ZIO patch monitor will be mailed 3 day USPS to your address on file. It may take 3-5 days  to receive your monitor after you have been enrolled.  Once you have received your monitor, please review the enclosed instructions. Your monitor  has already been registered assigning a specific monitor serial # to you.  Billing and Patient Assistance Program Information  We have supplied [employer] with any of your insurance information on file for billing purposes. [employer] offers a sliding scale Patient Assistance Program for patients that do not have  insurance, or whose insurance does not completely cover the cost of the ZIO monitor.  You must apply for the Patient Assistance Program to qualify for this discounted rate.  To apply, please call [employer] at [phone], select option 4, select option 2, ask to apply for  Patient Assistance Program. Theodore Demark will ask your household income, and how many people  are in your household. They will quote your out-of-pocket cost based on that information.  [employer] will also be able to set up a 30-month, interest-free payment plan if needed.  Applying the monitor   Shave hair from upper left chest.  Hold abrader disc by orange tab. Rub abrader in 40 strokes over the upper  left chest  as  indicated in your monitor instructions.  Clean area with 4 enclosed alcohol pads. Let dry.  Apply patch as indicated in monitor instructions. Patch will be placed under collarbone on left  side of chest with arrow pointing upward.  Rub patch adhesive wings for 2 minutes. Remove white label marked "1". Remove the white  label marked "2". Rub patch adhesive wings for 2 additional minutes.  While looking in a mirror, press and release button in center of patch. A small green light will  flash 3-4 times. This will be your only indicator that the monitor has been turned on.  Do not shower for the first 24 hours. You may shower after the first 24 hours.  Press the button if you feel a symptom. You will hear a small click. Record Date, Time and  Symptom in the Patient Logbook.  When you are ready to remove the patch, follow instructions on the last 2 pages of Patient  Logbook. Stick patch monitor onto the last page of Patient Logbook.  Place Patient Logbook in the blue and white box. Use locking tab on box and tape box closed  securely. The blue and white box has prepaid postage on it. Please place it in the mailbox as  soon as possible. Your physician should have your test results approximately 7 days after the  monitor has been mailed back to [facility].  Call [facility] at [phone] if you have questions regarding  your ZIO XT patch monitor. Call them immediately if you see an orange light blinking on your  monitor.  If your monitor falls off in less than 4 days, contact our Monitor department at [phone].  If your monitor becomes loose or falls off after 4 days call [employer] at [phone] for  suggestions on securing your monitor    Follow-Up: At [facility], you and your health needs are our priority.  As part of our continuing mission to provide you with exceptional heart care, we have created designated Provider Care Teams.  These Care  Teams include your primary Cardiologist (physician) and Advanced Practice Providers (APPs -  Physician Assistants and Nurse Practitioners) who all work together to provide you with the care you need, when you need it.  We recommend signing up for the patient portal called "MyChart".  Sign up information is provided on this After Visit Summary.  MyChart is used to connect with patients for Virtual Visits (Telemedicine).  Patients are able to view lab/test results, encounter notes, upcoming appointments, etc.  Non-urgent messages can be sent to your provider as well.   To learn more about what you can do with MyChart, go to [email or web address].    Your next appointment:   1 year(s)  The format for your next appointment:   In Person  Provider:   Sherren Mocha, MD      Signed, Sherren Mocha, MD  10/02/2020 10:14 AM    [place]

## 2020-10-05 ENCOUNTER — Other Ambulatory Visit: Payer: Self-pay

## 2020-10-05 MED ORDER — GABAPENTIN 600 MG PO TABS: ORAL_TABLET | ORAL | 0 refills | Status: DC

## 2020-10-09 DIAGNOSIS — R002 Palpitations: Secondary | ICD-10-CM

## 2020-10-23 ENCOUNTER — Ambulatory Visit (HOSPITAL_COMMUNITY): Payer: PPO | Attending: Emergency Medicine

## 2020-10-23 ENCOUNTER — Encounter (HOSPITAL_COMMUNITY): Payer: Self-pay

## 2020-10-23 ENCOUNTER — Other Ambulatory Visit: Payer: Self-pay

## 2020-10-23 ENCOUNTER — Ambulatory Visit (HOSPITAL_COMMUNITY): Payer: PPO | Attending: Cardiovascular Disease

## 2020-10-23 DIAGNOSIS — R0602 Shortness of breath: Secondary | ICD-10-CM | POA: Diagnosis not present

## 2020-10-23 LAB — ECHOCARDIOGRAM COMPLETE
Area-P 1/2: 4.31 cm2
S' Lateral: 3.2 cm

## 2020-10-30 DIAGNOSIS — R002 Palpitations: Secondary | ICD-10-CM | POA: Diagnosis not present

## 2020-10-31 ENCOUNTER — Other Ambulatory Visit: Payer: Self-pay | Admitting: Nurse Practitioner

## 2020-10-31 DIAGNOSIS — N529 Male erectile dysfunction, unspecified: Secondary | ICD-10-CM

## 2020-11-12 ENCOUNTER — Telehealth: Payer: Self-pay | Admitting: Cardiovascular Disease

## 2020-11-12 NOTE — Telephone Encounter (Signed)
The patient has been notified of the result and verbalized understanding.  All questions (if any) were answered. Nuala Alpha, LPN 25/0/0370 4:88 PM

## 2020-11-12 NOTE — Telephone Encounter (Signed)
Patient is returning call to discuss echo results. °

## 2020-11-12 NOTE — Telephone Encounter (Signed)
-----   Message from Sherren Mocha, MD sent at 11/11/2020  8:46 PM EST ----- Echo looks good with no significant problems identified. Continue current Rx. thanks

## 2020-11-21 ENCOUNTER — Ambulatory Visit: Payer: PPO | Admitting: Neurology

## 2020-11-22 ENCOUNTER — Encounter: Payer: Self-pay | Admitting: Cardiovascular Disease

## 2020-11-28 NOTE — Telephone Encounter (Signed)
Called pt and reviewed MD results of heart monitor.  All questions answered pt verbalizes understanding.

## 2020-12-05 ENCOUNTER — Other Ambulatory Visit: Payer: Self-pay

## 2020-12-05 MED ORDER — TELMISARTAN 80 MG PO TABS: 80.0000 mg | ORAL_TABLET | Freq: Every day | ORAL | 0 refills | Status: DC

## 2020-12-10 ENCOUNTER — Telehealth: Payer: Self-pay | Admitting: Internal Medicine

## 2020-12-10 NOTE — Chronic Care Management (AMB) (Signed)
  Chronic Care Management   Outreach Note  12/10/2020 Name: BERNIS SCHREUR MRN: [id number] DOB: December 01, 1950  Referred by: [name] Pinto, MD Reason for referral : No chief complaint on file.   An unsuccessful telephone outreach was attempted today. The patient was referred to the pharmacist for assistance with care management and care coordination.   Follow Up Plan:   Tatjana Dellinger Upstream Scheduler

## 2020-12-12 ENCOUNTER — Encounter: Payer: Self-pay | Admitting: Neurology

## 2020-12-13 ENCOUNTER — Ambulatory Visit: Payer: PPO | Admitting: Neurology

## 2020-12-13 ENCOUNTER — Encounter: Payer: Self-pay | Admitting: Neurology

## 2020-12-13 VITALS — BP 123/74 | HR 70 | Ht 70.0 in | Wt 209.0 lb

## 2020-12-13 DIAGNOSIS — I493 Ventricular premature depolarization: Secondary | ICD-10-CM

## 2020-12-13 DIAGNOSIS — F518 Other sleep disorders not due to a substance or known physiological condition: Secondary | ICD-10-CM | POA: Diagnosis not present

## 2020-12-13 DIAGNOSIS — R7309 Other abnormal glucose: Secondary | ICD-10-CM

## 2020-12-13 DIAGNOSIS — G4733 Obstructive sleep apnea (adult) (pediatric): Secondary | ICD-10-CM

## 2020-12-13 DIAGNOSIS — Z9989 Dependence on other enabling machines and devices: Secondary | ICD-10-CM | POA: Diagnosis not present

## 2020-12-13 MED ORDER — ZOLPIDEM TARTRATE 5 MG PO TABS: ORAL_TABLET | ORAL | 2 refills | Status: DC

## 2020-12-13 NOTE — Patient Instructions (Signed)
Trazodone 50 mg one hour before bedtime.

## 2020-12-13 NOTE — Progress Notes (Signed)
SLEEP MEDICINE CLINIC   Provider:  [name], MD  Referring Provider: [name] Pinto, MD Primary Care Physician:  [name] Pinto, MD  Chief Complaint  Patient presents with   Follow-up    Pt alone, rm 10. Presents as follow up. Overall stable. States in need of new supplies/mask. States that curious in updated equipment. DME. [employer] set up 02/14/2016 in feb 2023 should be eligible   RV on 12-13-2020, Bryan " [place] "[name] is here for a 6 months visit. His last visit was dedicated to hand pain, not to CPAP compliance. He has retired and has better sleep habits, less STRESSED- and he may have no longer hav a need for Ambien. He still has trazodone at home- I like for him to use it again, and take it 1-2 hours before bedtime ! . I have suspected he is a short sleep sleeper - and has been all his life.  He feels well the next day- and he is only bothered when he tries to hard.  Excellent response to CPAP auto settings between 8 and 18 cm water now- 3 cm EPR, residual AHI 1.5 /h. He is due to a new machine next year and I like for him to undergo a baseline study.  Uses a 95% pressure of 13 cm water. Has no Cheynes -Stokes respirations arising. He will avoid TV in Bed after 11 PM.      RV on 05-24-2020: I have the pleasure of meeting Bryan [name] today in a revisit who is just a couple of days short of his 70th birthday.  He has used the CPAP compliantly is a 77% compliance 23 out of 30 days his average use at time of 4 hours and 59 minutes on days used.  However they have been 3 days where he did not use the machine.  He has using an AutoSet between 8 and 18 cm water pressure with 3 cm expiratory pressure relief and achieved an AHI residual of only 2.3/h.  I considered that his apnea is very well controlled.  The pressure at the 95th percentile is 14.3 cmH2O and he does have moderate air leakage.  He has just changed from a larger nasal pillow to a medium size and finds this  fit him best.  He remains a patient with chronic insomnia has in the past had chronic pain, hypertension, coronary artery disease and we have tried and failed Klonopin, Lunesta, doxepin, trazodone he has tried [employer] and has not had the desired effect from it even after he doubled the dose.  He has never seen a cognitive behavioral therapist for insomnia and this would be my strong recommendation however he also stated that his best result was ever achieved on a CR release Ambien So and I wonder if he is just not able to respond to any other medication or intervention.   We had used 6.8 mg Ambien CR in the year 2020 and we had prescribed 5 mg of Sonata in 2021, both with limited success.  He has failed a dental device that Dr. Ron Parker has made in the past.  He continued to have felt a high degree of fatigue but he was not as excessively sleepy.  He does have a high risk higher sleep efficiency when he uses CPAP;  And he dreams about 3 or 4 times a week on average.   11-17-2019: I have the pleasure of seeing Bryan [name] again he had undergone a polysomnography test and had previously  had a home sleep test which confirmed an AHI of 30.9.  His titration to positive airway pressure took place on 18 May of this year under a Respironics DreamWear mask this was a full facemask which spares the nose and was used in size large.  The patient did well actually as long as he could avoid sleeping on his back.  Sleep-related hypoxemia improved on the final pressure of titration and central apneas were actually reduced on the CPAP there were still some PLM's but much less than in the there is at the beginning of the study.  The total oxygen desaturation time equaled only 8 minutes.  I ordered an auto CPAP setting between 8 and 18 cmH2O best to centimeter EPR and try to persuade the patient to sleep on his back.  He has noticed at home that he has much less restless legs now but he has not been able to use his CPAP in the  lab.date Response him to have been to his CPAP at 16 cmH2O, and therefore his AutoSet was meant to encompass the pressure when he uses the machine his AHI is under 3/h with some residual obstructive apneas but he has not been able to sleep with it on it is not particularly that the mask or machine bothers him it is his underlying insomnia but has not improved and makes it so hard for him to sleep with or without machine.  He now uses an adjustable bed at home and that to him feels much better so what I like for him to try as we are changing his sleep aid #1 again and will exchange the 6.8 mg Ambien CR to  an immediate release-  I will offer Sonata - 5 mg.   PS: Wife reports a lot of microsleep attacks.   RV on 05-03-2019 CD. Bryan [name] is returning after PSG test and reports ongoing dependency on Ambien- cannot sleep with trazodone. He had undergone a home sleep test on April 14 and today the home sleep test confirmed an AHI of 30.9 which is considered severe apnea, much higher REM sleep dependent AHI of 52.3 and a complete 9-hour sleep time.  Very concerning however 95.8 minutes out of his total sleep time there in oxygen desaturation.  Positional data did not register.  Based on these home sleep test data I think that a dental device is not an appropriate treatment.  There is such as strict dependency on REM sleep with oxygen desaturation and variable heart rates but only a CPAP device is actually an appropriate treatment.  Even the inspire device is not as likely to help him overcome REM dependent sleep apnea.  His continues to endorse the Epworth Sleepiness Scale at 16 out of 24 points, there was no fatigue endorsed.  He continues to have trouble sleeping at night and reported that he slept rather well when using Ambien.  I would consider him by now dependent on sleep aids and I will continue to provide the medication that he needs to get sleep also we will probably need to alternate 4 days of Ambien with 3  days of trazodone or something else to help him slowly wean off. Offered belsomra. He uses melatonin. He has tried oils, soft meditative music, all helps to relax, none helps to sleep through.   RV on 03-31-2019, Bryan [name], 69 years of age, and a patient with OSA and failed PAP therapy- he has already been vaccinated for Covid, both shots, Moderna. Dr Ron Parker  made a dental device that has only been partially effective as there is REM dependent apnea. We meet to re cap and look for alternatives. Bryan [name] who prefers to go by brain has had trouble with his front teeth especially his left canine, and he relates the breakage of the tooth to the difficulties and resistance of the dental device he wore.  So he is reluctant to get back on it but also because it can cause jaw pain as well as not treating his apnea effectively.  Dr. Ron Parker' had shown that there was still a high residual AHI. Dr. Ron Parker allowed me to review his diagnostic report this was a ResMed device, his so home sleep test was done by apnea link.  Supine AHI 30.8 of which apneas were 17.9, he slept 1 hour 10 minutes in supine so this was a big part of the sleep study.  Nonsupine sleep AHI 1.92 apneas 0.3/h.  Total events all obstructive no Cheyne-Stokes respiration no prolonged oxygen desaturation heart rate variability was great between 45 bpm and 148 bpm but this is notoriously artifact prone in any home sleep test.  Bryan [name] also endorsed the fatigue severity scale today at 53 out of 63 points which is high, his sleep has not been refreshing or restorative and is highly fragmented.  He endorsed the Epworth Sleepiness Scale at 8 points out of 24.  I would like to recap his sleep study from 10 January 2016 at the time he only slept 68% of the recorded time in the lab his AHI was 21.6 and he did not reach REM sleep.  The problem is that we could not evaluate REM sleep dependent apnea in a patient who never entered REM sleep in the first place.  He  returned for full night titration on 30 January the same year his sleep efficiency the first night was 91.6% his AHI reduced to 0.9 this time REM sleep was entered and during REM sleep he still has a 3.8 apneas per hour  He often lost the CPAP interface in the night- the snoring was gone while he used the CPAP and the dental device.  Still had REM dependent apneas- he has heard of the inspire device but wasmn't aware of the implant nature- it's a hypoglossal CN stimulator. It still opens the airway but does not assist the air flow to the ribcage.    He reports insomnia. He has tried [place] -its giving him a metal taste and stopped working. Has had ambien but not ER form .never tried trazodone.      HPI: I have the pleasure of seeing Bryan [name] today on 10/27/2016, he reports he has struggled with CPAP use in spite of trying Klonopin, Lunesta and even Ambien to help him overcome the insomnia that he developed. His first week compliant with CPAP was rather easy but something changed and he would lay awake for an hour before initiating sleep. The CPAP setting of 11 cm water with 3 cm EPR his residual AHI is excellent at only 2.6 apneas per hour but he only used the machine for 4 out of the last 30 days because of the bothersome problem of sleep initiation. He feels tired and fatigued during the day but he has also name several stressors that have culminated over the last 6 month and may be the cause of his insomnia. I would like for him to try an SSRi, but he just started on Zoloft. He wakes with a puffy uvula  and parched mouth.  He may be a good candidate for a dental device/    Bryan [name] returns today, after he  had spent several vacations also in [place], [name]  He went to [place] in January when he needed a wheelchair to get around !  Bryan [name] presents today for his first revisit after a sleep study from 01/10/2016.His sleep study showed moderate sleep apnea with an  AHI of 21.6 per hour, REM sleep had not been recorded but his supine positional sleep accentuated the apneas too 49.6 per hour. Avoiding the supine position was one of the recommendations along side with CPAP use. He returned for CPAP titration on 02/05/2016 and was titrated to 11 cm water pressure with a reduction of the AHI to 0.0 and was significant REM sleep rebound. He had some periodic limb movements but few arousals at 1.2 per hour, and his heart rate stayed in normal sinus rhythm. Sleep efficiency was 92% he did not have oxygen desaturations of significance.  In the meantime he has been using CPAP at 11 cm water pressure but his compliance has been poor he has only use it for 47% of the time for over 4 hours with an average user time of 3 hours and 1 minute. His AHI is significantly reduced when he uses the machine at 1.3 per hour. Over the last week he has made a concerted effort to use CPAP at 5 hours and 15 minutes daily,  He has undergone hip and nerve-root injections, with severe pain being only gradually relieved he sleeps often in a recliner. Since early April he had finally enough pain control and is less insomnic. He explained that he does only take tylenol 3 when needed, less than twice a week.  His left leg still jerks a lot when in bed or reclined. He has noted a right hand tremor.     [name] is a 70 y.o. male , seen here as a referral from Dr. Melford Aase for snoring, witnessed apnea and needs an evaluation. Last week he was in [place] , [place], and didn't snore. His wife was amazed. He was skiing every day and slept well. Over the last couple of years his snoring has become louder, to a level where his wife often has left the marital bedroom. His past medical history and medical history is positive for coronary artery disease in the native coronary artery, an 80% stenosis of the distal LAD, 99% stenosis proximal and. A stent was placed. He also has diverticulosis,  gastroesophageal reflux disease, hernia, he had supraventricular tachycardia and was treated with an ablation in 2004, hyperlipidemia, hypertension, symptomatic palpitations, PVCs, vitamin D deficiency. I reviewed his medication; he has silenor/ doxepin  available at bedtime as a sleep aid, takes Zoloft, Crestor, Zofran in case of nausea, lisinopril, gabapentin 3 times a day Zetia by systolic, Plavix, Flexeril 10 mg for muscle spasms as needed. He has noted a very dry mouth and his dentist noted a dry mouth as well, likely related to doxepin. lunesta failed- metal taste. Best on klonopin. Sleep habits are as follows: The patient is usually in bed by 10 PM but struggles to fall asleep without medication. He is taking doxepin. He sometimes has myoclonic tics at night. He averages 4-6 hours of nocturnal sleep, the bedroom is cool, quiet and dark. He prefers to sleep on his side. 2 pillows for head support usually. He will have one nocturia, during the  night , sometimes has trouble to go back to sleep. The bathroom break is usually between 2 and 3 AM . He has also 3-4 times a week dreams that he can recall, and sometimes tries to enact. These wake him up- arm and leg movements.  His dreams are not nightmarish in character and he usually does not feel that he has to defend himself or is under threat  He wakes up with a very dry mouth. No headaches, rarely palpitations. Sleep medical history and family sleep history:  No tonsillectomy, TBI, and no sinus ENT surgery.    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  Just retired September 2021-  Never smoked, caffeine , 2 cups of coffee in AM, sometimes Soda, rare tea.  Drinks 2-4 glasses of wine weekly- .   Review of Systems: Out of a complete 14 system review, the patient complains of only the following symptoms, and all other reviewed systems are negative.  tinnitus,  Diplopia.  Non  compliant with CPAP-  Epworth score 7 / 24 on CPAP , Fatigue severity score N/A- , depression score 2/15 last visit .  Ambien, Sinequan, Lunesta.    How likely are you to doze in the following situations: 0 = not likely, 1 = slight chance, 2 = moderate chance, 3 = high chance  Sitting and Reading? Watching Television? Sitting inactive in a public place (theater or meeting)? Lying down in the afternoon when circumstances permit? Sitting and talking to someone? Sitting quietly after lunch without alcohol? In a car, while stopped for a few minutes in traffic? As a passenger in a car for an hour without a break?  Total =  6 points on 12-13-2020!  From 14/ 24  points last year   FSS at 31/ 60.  high energy, attention deficit hyperactivity.      Social History   Socioeconomic History   Marital status: Married    Spouse name: Not on file   Number of children: 3   Years of education: Not on file   Highest education level: Not on file  Occupational History   Not on file  Tobacco Use   Smoking status: Never   Smokeless tobacco: Never  Vaping Use   Vaping Use: Never used  Substance and Sexual Activity   Alcohol use: Yes    Comment: rarely   Drug use: No   Sexual activity: Not on file  Other Topics Concern   Not on file  Social History Narrative   Drinks 2 cups of coffee a day    Social Determinants of Health   Financial Resource Strain: Not on file  Food Insecurity: Not on file  Transportation Needs: Not on file  Physical Activity: Not on file  Stress: Not on file  Social Connections: Not on file  Intimate Partner Violence: Not on file    Family History  Problem Relation Age of Onset   Brain cancer Father    Hypothyroidism Mother    Irritable bowel syndrome Mother    Colonic polyp Mother        PRE-malignant polyp of rectosigmoid colon   Colon polyps Mother    Esophageal cancer Neg Hx    Stomach cancer Neg Hx    Pancreatic cancer Neg Hx    Liver disease  Neg Hx    Colon cancer Neg Hx    Rectal cancer Neg Hx     Past Medical History:  Diagnosis Date  Allergy    CAD (coronary artery disease), native coronary artery    Cath 04/19/2001  normal Left main, moderate proximal disease, 80% stenosis distal LAD, 99% stenosis proximal OM 1, 60% stenosis mid RCA, 70% stenosis ostial mid PDA  PCI with bare metal stent of OM1 same date 3.5 x 18 mm Multilink     Diverticulosis    GERD (gastroesophageal reflux disease)    Hernia    History of PSVT    Ablation done in 2004 at [facility]    Hyperlipidemia    Hypertension    Sleep apnea    Symptomatic PVCs    Vitamin D deficiency     Past Surgical History:  Procedure Laterality Date   ACHILLES TENDON REPAIR     left   APPENDECTOMY     CARDIAC ELECTROPHYSIOLOGY MAPPING AND ABLATION     COLONOSCOPY     CORONARY ANGIOPLASTY WITH STENT PLACEMENT     years ago    Current Outpatient Medications  Medication Sig Dispense Refill   buPROPion (WELLBUTRIN XL) 150 MG 24 hr tablet Take  1 tablet  Daily  for Mood, Focus & Concentration 90 tablet 3   Cholecalciferol (VITAMIN D) 50 MCG (2000 UT) CAPS Take by mouth.     clopidogrel (PLAVIX) 75 MG tablet Take  1 tablet  Daily  to Prevent Blood Clots 90 tablet 3   ezetimibe (ZETIA) 10 MG tablet Take  1 tablet  Daily  for Cholesterol 90 tablet 3   gabapentin (NEURONTIN) 600 MG tablet TAKE 1/2 TO 1 TABLET BY MOUTH TWO TO THREE TIMES A DAY AS NEEDED FOR PAIN 270 tablet 0   nebivolol (BYSTOLIC) 10 MG tablet TAKE ONE TABLET BY MOUTH DAILY FOR FOR BLOOD PRESSURE 90 tablet 1   nitroGLYCERIN (NITROSTAT) 0.4 MG SL tablet Dissolve 1 tablet under tongue every 3 to 5 minutes if needed for Angina 50 tablet 11   phentermine (ADIPEX-P) 37.5 MG tablet Take 1/2 to 1 tablet every Morning for Dieting & Weight Loss 90 tablet 1   rosuvastatin (CRESTOR) 20 MG tablet Take 1 tablet (20 mg total) by mouth daily. Please keep upcoming appt in December 2022 with Cardiologist before  anymore refills. Thank you Final Attempt 90 tablet 1   sildenafil (VIAGRA) 100 MG tablet TAKE 0.5 TO 1 TABLET BY MOUTH ONE HOUR BEFORE NEEDED 30 tablet 1   telmisartan (MICARDIS) 80 MG tablet Take 1 tablet (80 mg total) by mouth daily. 90 tablet 0   zolpidem (AMBIEN) 5 MG tablet TAKE ONE TABLET BY MOUTH EVERY EVENING AS NEEDED FOR SLEEP 30 tablet 2   No current facility-administered medications for this visit.    Allergies as of 12/13/2020 - Review Complete 12/13/2020  Allergen Reaction Noted   Lisinopril Cough 06/05/2016   Amoxicillin Hives and Swelling 11/16/2012   Penicillins Swelling 12/10/2011    Vitals: BP 123/74   Pulse 70   Ht 5\' 10"  (1.778 m)   Wt 209 lb (94.8 kg)   BMI 29.99 kg/m  Last Weight:  Wt Readings from Last 1 Encounters:  12/13/20 209 lb (94.8 kg)   EZM:OQHU mass index is 29.99 kg/m.     Last Height:   Ht Readings from Last 1 Encounters:  12/13/20 5\' 10"  (1.778 m)    Physical exam:  General: The patient is awake, alert and appears not in acute distress. The patient is well groomed. Head: Normocephalic, atraumatic. Neck is supple. Mallampati 3.  neck circumference: 17. Nasal airflow congested and  present with a dry mouth. All natural teeth.  Cardiovascular:  Regular rate and rhythm. Respiratory: Lungs are clear to auscultation. Skin:  Facial puffiness,  Trunk: BMI is 31-, has a less arthralgic gait.  Neurologic exam :The patient is awake and alert, oriented to place and time.    Cranial nerves: no loss of smell or taste  Pupils are equal and briskly reactive to light. Extraocular movements  in vertical and horizontal planes  without nystagmus.  Visual fields by finger perimetry are intact. Hearing to finger rub intact. Facial sensation intact to fine touch.  Facial motor strength is symmetric and tongue and uvula move midline.  Shoulder shrug was symmetrical.  Motor exam:  No restless legs, no Spasticity.  Sensory:  Numbness in right hand, not  carpaltunnel, but cervical radiculopathy- and chronically present now- has seen Dr. Ellene Route. .   Deep tendon reflexes: in the upper and lower extremities are symmetric and intact.      Assessment:  After physical and neurologic examination, review of laboratory studies,  Personal review of imaging studies, reports of other /same  Imaging studies,   Results of polysomnography/ neurophysiology testing and pre-existing records as far as provided in visit., my 18 minute assessment is :  1)  His sleep study (HST in 2/ 2018  ) confirmed OSA and REM sleep dependent form- and in lab CPAP titration to 16 cm relieved this. He needs to keep using his CPAP for this form of apnea and he has finaly found a comfortable mask.  He is due for a new machine.  2) I believe now  that he is a short sleeper- a genetic disposition. we may not deal with true Insomnia - chronic , (failed Ambien, Klonopin, Lunesta. failed doxepin,trazodone,  - he slept better opening the CR Ambien capsule, thus eliminating the CR form.  - failed sonata.  Since he is retired, and he is now stress free, he still only sleeps 4-5 hours and feels well rested and refreshed after short sleep.   I recommend again . There is several smart phone applications that would play meditative music or give breathing direction. He uses a nasal pillow mask.  His comorbidities, including hypertension, coronary artery disease, hyperlipidemia, and chronic pain - all affect sleep as well as the medications.  Plan:  Treatment plan and additional workup : q 12 month follow up.   HST repeat for new baseline - and new machine is due in spring 2023.     [name], M.D.   CC: [name] Pinto, [address] [place] [address],  [place] [id number]

## 2020-12-20 ENCOUNTER — Ambulatory Visit: Payer: Medicare Other | Admitting: Cardiology

## 2020-12-21 ENCOUNTER — Telehealth: Payer: Self-pay | Admitting: Internal Medicine

## 2020-12-21 NOTE — Chronic Care Management (AMB) (Signed)
°  Chronic Care Management   Note  12/21/2020 Name: Bryan [name] MRN: [id number] DOB: May 01, 1950  Bryan [name] is a 70 y.o. year old male who is a primary care patient of [name] Pinto, MD. I reached out to [name] by phone today in response to a referral sent by Mr. Nena Jordan Schwall's PCP, [name] Pinto, MD.   [name] was given information about Chronic Care Management services today including:  CCM service includes personalized support from designated clinical staff supervised by his physician, including individualized plan of care and coordination with other care providers 24/7 contact phone numbers for assistance for urgent and routine care needs. Service will only be billed when office clinical staff spend 20 minutes or more in a month to coordinate care. Only one practitioner may furnish and bill the service in a calendar month. The patient may stop CCM services at any time (effective at the end of the month) by phone call to the office staff.   Patient agreed to services and verbal consent obtained.   Follow up plan:   Tatjana Secretary/administrator

## 2021-01-01 ENCOUNTER — Encounter: Payer: Self-pay | Admitting: Neurology

## 2021-01-06 ENCOUNTER — Other Ambulatory Visit: Payer: Self-pay | Admitting: Nurse Practitioner

## 2021-01-10 ENCOUNTER — Other Ambulatory Visit: Payer: Self-pay | Admitting: Cardiovascular Disease

## 2021-01-21 ENCOUNTER — Ambulatory Visit (INDEPENDENT_AMBULATORY_CARE_PROVIDER_SITE_OTHER): Payer: PPO | Admitting: Neurology

## 2021-01-21 DIAGNOSIS — R7309 Other abnormal glucose: Secondary | ICD-10-CM

## 2021-01-21 DIAGNOSIS — G4733 Obstructive sleep apnea (adult) (pediatric): Secondary | ICD-10-CM

## 2021-01-21 DIAGNOSIS — Z9989 Dependence on other enabling machines and devices: Secondary | ICD-10-CM

## 2021-01-21 DIAGNOSIS — I493 Ventricular premature depolarization: Secondary | ICD-10-CM

## 2021-01-21 DIAGNOSIS — F518 Other sleep disorders not due to a substance or known physiological condition: Secondary | ICD-10-CM

## 2021-01-23 NOTE — Progress Notes (Signed)
[facility] TEST REPORT ( by Watch PAT)   STUDY DATE:  data loaded 01-23-2021   ORDERING CLINICIAN: [name], MD  REFERRING CLINICIAN: Dr Melford Aase   CLINICAL INFORMATION/HISTORY:  [facility] set up 02/14/2016 - new CPAP by 02/2021 eligible    RV on 12-13-2020, Bryan [name] is here for a 6 months visit. His last visit was dedicated to hand pain, not to CPAP compliance. He has retired and has better sleep habits, less STRESSED- and he may have no longer hav a need for Ambien. He still has trazodone at home- I like for him to use it again, and take it 1-2 hours before bedtime ! . I have suspected he is a short sleep sleeper - and has been all his life.  He feels well the next day- and he is only bothered when he tries to hard.  Has some PVCs.  Excellent response to CPAP auto settings between 8 and 18 cm water now- 3 cm EPR, residual AHI 1.5 /h. He is due to a new machine next year and I like for him to undergo a baseline study.  Uses a 95% pressure of 13 cm water. Has no Cheynes -Stokes respirations arising. He will avoid TV in Bed after 11 PM.      Epworth sleepiness score: 8/24.   BMI:30.9 kg/m   Neck Circumference: 17"   FINDINGS:   Sleep Summary:   Total Recording Time (hours, min):    Total recording time for this home sleep test amounted to 10 hours and 20 minutes of which 8 hours and 44 minutes was a total calculated sleep time.   Percent REM (%): 33.7%                                      Respiratory Indices:   Calculated pAHI (per hour): The apnea-hypopnea index overall was 23.6 and did not vary much between REM and non-REM sleep.                                              Positional AHI and snoring data:    The chest wall electrode has failed.  This home sleep test did not provide positional data or snoring data.                                               Oxygen Saturation Statistics:   O2 Saturation Range (%):    Oxygen saturation varied between a nadir of 63% and a maximum 100% with a mean oxygenation of 93%.                                    O2 Saturation (minutes) <89%:   32.4 minutes, the equivalent of 6.2% of sleep time. O2 saturation under 90% was present for 39 minutes.        Pulse Rate Statistics:   Pulse Mean (bpm):        66 bpm         Pulse Range:  Between 36 115 bpm            IMPRESSION:  This HST confirms the presence of moderate sleep apnea without REM sleep dependency, but associated with mild to moderate hypoxia.  There was also a great variability of heart rate.  Based on these data the patient should be able to continue using CPAP.  If he is interested in alternative treatments, Dawna Part would be an option- due to BMI and no evidence of REM sleep dependence.  However the hypoxia would not be corrected.   RECOMMENDATION: Auto set CPAP titration settings between 8 and 18 cmH2O with 3 cm expiratory pressure relief, heated humidification and interface of the patient's choice and comfort.    INTERPRETING PHYSICIAN:   [name], MD   Medical Director of [facility] at [facility].

## 2021-01-28 ENCOUNTER — Telehealth: Payer: Self-pay

## 2021-01-28 NOTE — Telephone Encounter (Signed)
Called patient to confirm CPP visit on 1/25.Unable to reach patient. [place].   Total time spent: 3 min Vanetta Shawl, [facility]

## 2021-01-29 DIAGNOSIS — F518 Other sleep disorders not due to a substance or known physiological condition: Secondary | ICD-10-CM | POA: Insufficient documentation

## 2021-01-29 DIAGNOSIS — Z9989 Dependence on other enabling machines and devices: Secondary | ICD-10-CM | POA: Insufficient documentation

## 2021-01-29 NOTE — Progress Notes (Signed)
IMPRESSION:  This HST confirms the presence of moderate sleep apnea without REM sleep dependency, but associated with mild to moderate hypoxia.  There was also a great variability of heart rate.  Based on these data the patient should be able to continue using CPAP.  If he is interested in alternative treatments, Dawna Part would be an option- due to BMI and no evidence of REM sleep dependence.  However the hypoxia would not be corrected.  RECOMMENDATION: Auto set CPAP titration settings between 8 and 18 cmH2O with 3 cm expiratory pressure relief, heated humidification and interface of the patient's choice and comfort.   INTERPRETING PHYSICIAN:   [name], MD   Medical Director of [facility] at [facility].

## 2021-01-29 NOTE — Addendum Note (Signed)
Addended by: [name] on: 01/29/2021 04:50 PM   Modules accepted: Orders

## 2021-01-29 NOTE — Procedures (Signed)
[facility] TEST REPORT ( by Watch PAT)   STUDY DATE:  data loaded 01-23-2021   ORDERING CLINICIAN: [name], MD  REFERRING CLINICIAN: Dr Melford Aase   CLINICAL INFORMATION/HISTORY:  [facility] health set up 02/14/2016 - new CPAP by 02/2021 eligible    RV on 12-13-2020, Bryan " [place] "[name] is here for a 6 months visit. His last visit was dedicated to hand pain, not to CPAP compliance. He has retired and has better sleep habits, less STRESSED- and he may have no longer hav a need for Ambien. He still has trazodone at home- I like for him to use it again, and take it 1-2 hours before bedtime ! . I have suspected he is a short sleep sleeper - and has been all his life.  He feels well the next day- and he is only bothered when he tries to hard.  Has some PVCs.  Excellent response to CPAP auto settings between 8 and 18 cm water now- 3 cm EPR, residual AHI 1.5 /h. He is due to a new machine next year and I like for him to undergo a baseline study.  Uses a 95% pressure of 13 cm water. Has no Cheynes -Stokes respirations arising. He will avoid TV in Bed after 11 PM.      Epworth sleepiness score: 8/24.   BMI:30.9 kg/m   Neck Circumference: 17"   FINDINGS:   Sleep Summary:   Total Recording Time (hours, min):    Total recording time for this home sleep test amounted to 10 hours and 20 minutes of which 8 hours and 44 minutes was a total calculated sleep time.   Percent REM (%): 33.7%                                      Respiratory Indices:   Calculated pAHI (per hour): The apnea-hypopnea index overall was 23.6 and did not vary much between REM and non-REM sleep.                                              Positional AHI and snoring data:    The chest wall electrode has failed.  This home sleep test did not provide positional data or snoring data.                                               Oxygen Saturation Statistics:   O2 Saturation Range (%):   Oxygen  saturation varied between a nadir of 63% and a maximum 100% with a mean oxygenation of 93%.                                    O2 Saturation (minutes) <89%:   32.4 minutes, the equivalent of 6.2% of sleep time. O2 saturation under 90% was present for 39 minutes.        Pulse Rate Statistics:   Pulse Mean (bpm):        66 bpm         Pulse Range:     Between  36 115 bpm            IMPRESSION:  This HST confirms the presence of moderate sleep apnea without REM sleep dependency, but associated with mild to moderate hypoxia.  There was also a great variability of heart rate.  Based on these data the patient should be able to continue using CPAP.  If he is interested in alternative treatments, Dawna Part would be an option- due to BMI and no evidence of REM sleep dependence.  However the hypoxia would not be corrected.   RECOMMENDATION: Auto set CPAP titration settings between 8 and 18 cmH2O with 3 cm expiratory pressure relief, heated humidification and interface of the patient's choice and comfort.    INTERPRETING PHYSICIAN:   [name], MD   Medical Director of [facility] at [facility].

## 2021-01-30 ENCOUNTER — Ambulatory Visit: Payer: PPO | Admitting: Pharmacist

## 2021-01-30 ENCOUNTER — Encounter: Payer: Self-pay | Admitting: Neurology

## 2021-01-30 ENCOUNTER — Other Ambulatory Visit: Payer: Self-pay

## 2021-01-30 VITALS — BP 122/67 | HR 70

## 2021-01-30 DIAGNOSIS — F5101 Primary insomnia: Secondary | ICD-10-CM

## 2021-01-30 DIAGNOSIS — I10 Essential (primary) hypertension: Secondary | ICD-10-CM

## 2021-01-30 DIAGNOSIS — R7309 Other abnormal glucose: Secondary | ICD-10-CM

## 2021-01-30 DIAGNOSIS — Z79899 Other long term (current) drug therapy: Secondary | ICD-10-CM

## 2021-01-30 DIAGNOSIS — N182 Chronic kidney disease, stage 2 (mild): Secondary | ICD-10-CM

## 2021-01-30 DIAGNOSIS — E782 Mixed hyperlipidemia: Secondary | ICD-10-CM

## 2021-01-30 DIAGNOSIS — G4733 Obstructive sleep apnea (adult) (pediatric): Secondary | ICD-10-CM

## 2021-01-30 DIAGNOSIS — Z9989 Dependence on other enabling machines and devices: Secondary | ICD-10-CM

## 2021-01-31 NOTE — Progress Notes (Signed)
Pharmacist Visit  [name], [name] 71 years, Male  DOB: 1950-07-31  M: [phone] Care Team: Rhys Martini, Hellon Vaccarella   __________________________________________________ Clinical Summary Situation:: Bryan [name] is a 71 year old male who presents for CCM initial visit. Pt has no chief complaints at this time. Background:: Patient has a past medical history of HTN, CKD, HLD, Insomnia, hx of PSVT, Symptomatic PVCs, Coronary artery disease, OSA, Vitamin D deficiency, prediabetes, and ED Assessment:: Patient blood pressure, A1c is controlled. Pt insomnia is uncontrolled and patient is seeing sleep neurologist. Pt currently taking zolpidem nightly to sleep and is unable to sleep if he does not take the medication. blood triglycerides decreased since las visit but is still elevated. Recommendations:: Pt stated he received 2 doses of Moderna COVID vaccine in March 2021 but did not remember the exact date of vaccination and could not locate his card during phone visit. Instructed patient to bring COVID vaccination card at next visit and will update his records. Educated patient on Shingrix Vaccine. Pt will obtain at local pharmacy Educated patient on Phentermine use and risk of increased insomnia with use. Also educated patient on peak effectiveness during the first month of use as a short term weight loss therapy Attestation Statement:: CCM Services:  This encounter meets complex CCM services and moderate to high medical decision making.  Prior to outreach and patient consent for Chronic Care Management, I referred this patient for services after reviewing the nominated patient list or from a personal encounter with the patient.  I have personally reviewed this encounter including the documentation in this note and have collaborated with the care management provider regarding care management and care coordination activities to include development and update of the comprehensive care plan  I am certifying that I agree with the content of this note and encounter as supervising physician.   Visit Details Patient scheduled for CCM visit with the clinical pharmacist.  Patient is referred for CCM by their PCP and CPP is under general PCP supervision.: At least 2 of these conditions are expected to last 12 months or longer and patient is at significant risk for acute exacerbations and/or functional decline.  Patient has consented to participation in [place] program. Visit Type: Phone Call Date of Upcoming Visit: 01/30/2021  Chronic Conditions Patient's Chronic Conditions: Hypertension (HTN), Chronic Kidney Disease (CKD), Hyperlipidemia/Dyslipidemia (HLD), Insomnia, Diabetes (DM), hx of PSVT, Symptomatic PVCs, Coronary artery disease, CAD, OSA, Vitamin D deficiency, prediabetes, ED  Doctor and Hospital Visits Were there PCP Visits in last 6 months?: Yes Visit #1: 09/27/20-Dana Demetra Shiner, NP (PCP)-Pt. presented for AWV.  STOPPED  Albuterol Sulfate 108 (90 Base) MCG/ACT Use 2 inhalations 15 minutes apart every 4 hours if need to rescue Asthma (Patient Preference) Daridorexant HCl 50 mg Oral Daily at bedtime (Patient Preference) Dexamethasone 4 MG Take 1 tab 3 x day - 3 days, then 2 x day - 3 days, then 1 tab daily (Completed Course) Doxycycline Hyclate 100 MG Take  2 capsules  Now  &amp; 1 capsule at supper, then  1 capsule 2 x /day with meals for Infection (Completed Course) Tamsulosin HCl 0.4 MG Take    1 tablet    at Bedtime    for Prostate (Patient Preference)  Were there Specialist Visits in last 6 months?: Yes Visit #1: 08/24/20-Whitworth, Thayer Jew (Dermatology)-No information available. Visit #2: 10/02/20-Cooper, Michael, MD (Cardiology)-Patient presented for SOB and Coronary Artery Disease. No medication changes noted. Visit #3: 10/09/20-Cooper, Legrand Como (Cardiology)-No information available. Visit #  4: 10/30/20-Irhythm Rockhill information available. Visit #5:  12/13/20-Dohmeier, Asencion Partridge, MD (Neurology)-Pt. presented for dependence on CPAP ventilation f/u. Was there a Hospital Visit in last 30 days?: No Were there other Hospital Visits in last 6 months?: No  Medication Information Are there any Medication discrepancies?: Yes Details:  Rosuvastatin 20mg -01/11/21 (&gt;5 days gap) 90DS by [facility] Telmisartan 80mg -12/05/20  (&gt;5 days gap) 90[facility] by [facility] Nebivolol 10mg -09/21/20  (&gt;5 days gap) 90DS by [facility] Phentermine 37.5mg -03/18/20  (&gt;5 days gap) [facility] by [facility] Ezetimibe 10mg -05/06/20  (&gt;5 days gap) 90DS by [facility] Bupropion 150mg -04/11/20  (&gt;5 days gap) 90DS by [facility] Plavix 75mg -04/03/20  (&gt;5 days gap) 90DS by [facility]  Are there any Medication adherence gaps (beyond 5 days past due)?: Yes Details:  Rosuvastatin 20mg -01/11/21 (&gt;5 days gap) 90DS by [facility] Telmisartan 80mg -12/05/20  (&gt;5 days gap) 90DS by [facility] Nebivolol 10mg -09/21/20  (&gt;5 days gap) 90DS by [facility] Phentermine 37.5mg -03/18/20  (&gt;5 days gap) 90DS by [facility] Ezetimibe 10mg -05/06/20  (&gt;5 days gap) 90DS by [facility] Bupropion 150mg -04/11/20  (&gt;5 days gap) 90DS by [facility] Plavix 75mg -04/03/20  (&gt;5 days gap) 90DS by [facility]  Medication adherence rates for the STAR rating drugs:  Rosuvastatin 20mg -01/11/21 90DS Telmisartan 80mg -12/05/20 90DS Nebivolol 10mg -09/21/20 90DS  List Patient's current Care Gaps: No current Care Gaps identified   Disease Assessments Current BP: 123/74 Current HR: 70 taken on: 12/13/2020 Previous BP: 100/60 Previous HR: 80 taken on: 09/26/2020 Weight: 209 BMI: 29.99 Last GFR: 69 taken on: 09/26/2020 Why did the patient present?: CCM initial visit Marital status?: Married Details: Bandon Sherwin Retired? Previous work?: Pt retired 1.5 years ago What does the patient do during the day?: Pt does a variety each day. 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 Help with other things at the church such as missions and subcommittees. He also spend a lot of time with his grandkids. Who does the patient spend their time with and what do they do?: During the day he spends time with his wife. His son lives nearby and they usually meet weekly for coffee. His brother also lives nearby and is retired so they spend a lot of time together. He also likes to play golf and exercise with friends. Lifestyle habits such as diet and exercise?: Exercise: very active and in good shape. Exercises 4-5 times a week. Member at [facility] [employer]. Diet: Oatmeal for breakfast, with a cup of coffee. sandwich for lunch. Dinner: meat and veggies or salad with chicken. chicken [place] sandwiches, eats a steaks on the weekend Water: Not enough. No more than  32 ounces a day. Alcohol, tobacco, and illicit drug usage?: Alcohol: 1 glass of wine occasionally Tobacco and Illicit drug use: None What is the patient's sleep pattern?: Trouble falling asleep, Trouble staying asleep How many hours per night does patient typically sleep?: 5-6 hours Patient pleased with health care they are receiving?: Yes Family, occupational, and living circumstances relevant to overall health?: His family history includes Brain cancer in his father; Colon polyps in his mother; Colonic polyp in his mother; Hypothyroidism in his mother; Irritable bowel syndrome in his mother. Name and location of Current pharmacy: [id number] - [place], [place] Current Rx insurance plan: HTA Are meds synced by current pharmacy?: Yes Are meds delivered by current pharmacy?: No - delivery not available Would patient benefit from direct intervention of clinical lead  in dispensing process to optimize clinical outcomes?: Yes Are [facility] pharmacy services available where patient lives?: Yes Is patient disadvantaged to use  [facility]?: No [facility] services reviewed with patient and patient wishes to change pharmacy?: Yes - patient provided verbal consent to transfer prescriptions to [facility].  Patient made aware that CCM team is committed to working with any pharmacy and patient is free to select the pharmacy of their choice that best meets their needs.  Patient also made aware they may discontinue use of [facility] at any time if they feel the service is not meeting their personal needs. Reasons patient wishes to change to [facility]: Packaging, Delivery of medication Dispensing Preference: Packaging 90 DS Any additional demeanor/mood notes?: Millan Legan is a 71 year old male who presents for CCM initial visit. Pt has no chief complaints at this time.  Hypertension (HTN) Assess this condition today?: Yes Is patient able to obtain BP reading today?: Yes BP today is: 122/67 Heart Rate is: 70 Goal: <130/80 mmHG Hypertension Stage: Elevated (SBP: 120-129 and DBP < 80) Patient has tried and failed: Lisinopril - cough  Is Patient checking BP at home?: Yes Pt. home BP readings are ranging: 122/73 How often does patient miss taking their blood pressure medications?: 1 time a month. Pt has not forgotten in a while Has patient experienced hypotension, dizziness, falls or bradycardia?: No Check present secondary causes (below) for HTN: Sleep Apnea BP RPM device: Does patient qualify?: No We discussed: DASH diet:  following a diet emphasizing fruits and vegetables and low-fat dairy products along with whole grains, fish, poultry, and nuts. Reducing red meats and sugars., Targeting 150 minutes of aerobic activity per week, Reducing the amount of salt intake to 1500mg /per day. Assessment:: Controlled Drug: Nebivolol 10mg  TAKE ONE TABLET BY MOUTH DAILY FOR FOR BLOOD PRESSURE Assessment: Appropriate, Effective, Safe, Accessible Drug: telmisartan (MICARDIS) 80 MG tablet Take 1 tablet  (80 mg total) by mouth daily Assessment: Appropriate, Effective, Safe, Accessible Additional Info: Patient checks BP approximately once a month. Patient states he picked up Nebivolol on 12/12/20. HC Follow up: N/A Pharmacist Follow up: Assess BP, HR, depression symptoms, BMP, renal function  Hyperlipidemia/Dyslipidemia (HLD) Last Lipid panel on: 09/26/2020 TC (Goal<200): 134 LDL: 61 HDL (Goal>40): 44 TG (Goal<150): 231 ASCVD 10-year risk?is:: Intermediate (7.5%-20%) ASCVD Risk Score: 13.1% Assess this condition today?: Yes LDL Goal: <70 Has patient tried and failed any HLD Medications?: No Check present secondary causes (below) that can lead to increased cholesterol levels (multi-choice optional): Beta blockers, CKD We discussed: How to reduce cholesterol through diet/weight management and physical activity., How a diet high in plant sterols (fruits/vegetables/nuts/whole grains/legumes) may reduce your cholesterol., Other (provide details below) Details: Counseled patient to avoid foods high in tryglyceried such as fried foods, sugary drinks, cakes, cookies, whole milk dairy, and butter Assessment:: Uncontrolled Drug: ezetimibe (ZETIA) 10 MG tablet Take 1 tablet Daily for Cholesterol Assessment: Appropriate, Query Effectiveness Drug: rosuvastatin (CRESTOR) 20 MG tablet Take 1 tablet (20 mg total) by mouth daily Assessment: Appropriate, Query Effectiveness HC Follow up: N/A Pharmacist Follow up: Assess lipid panel, LFTs, s/s rhabdo  Diabetes (DM) Current A1C: 5.4% taken on: 06/11/2020 Previous A1C: 5.7% taken on: 02/21/2020 Type: Pre-Diabetes/Impaired Fasting Glucose Assess this condition today?: Yes Goal A1C: Other Details: < 5.7% Type: Pre-Diabetes/Impaired Fasting Glucose We discussed: Low carbohydrate eating plan with an emphasis on whole grains, legumes, nuts, fruits, and vegetables and minimal refined and processed foods. Assessment:: Controlled Drug: None HC Follow  up:  N/A Pharmacist Follow up: Assess A1c and FBG  Insomnia Assess this condition today?: Yes Patient has following issues with sleeping: Trouble falling asleep, Trouble staying asleep What time do you typically go to bed?: 11pm, even later on weekends How long does it typically take you to fall asleep?: 30-45 min w/ Zolpidem. Without zolpidem, patient does not sleep What time do you typically wake up?: 6-7 Do you nap during the day?: No What changes have you made to your bedtime routine to help with sleep?: Avoiding screens (TV, phone, tablets) 30-60 min prior to bed, Making room cool and dark We discussed: Avoiding naps during the day to help sleep longer at night, Ensuring room is cool and dark when trying to sleep, Avoiding screens (TV, phone, tablets) prior to bedtime, Other Details: Counseled patient on Phentermine and that it can affect patients ability to sleep. Recommended patient take in the morning Assessment:: Controlled Drug: Zolpidem 5mg  every evening for sleep Assessment: Appropriate, Effective, Safe, Accessible Additional Info: Pt following with sleep doctor, Dr. Brett Fairy HC Follow up: N/A Pharmacist Follow up: Pt to continue to follow with sleep neurologist  Exercise, Diet and Non-Drug Coordination Needs Additional diet counseling points. We discussed: key components of the DASH diet, aiming to consume at least 8 cups of water day Discussed Non-Drug Care Coordination Needs: Yes Does Patient have Medication financial barriers?: No  Accountable Health Communities Health-Related Social Needs Screening Tool -  SDOH  (BloggerBowl.es) What is your living situation today? (ref #1): I have a steady place to live Think about the place you live. Do you have problems with any of the following? (ref #2): None of the above Within the past 12 months, you worried that your food would run out before you got money to buy more (ref #3):  Never true Within the past 12 months, the food you bought just didn't last and you didn't have money to get more (ref #4): Never true In the past 12 months, has lack of reliable transportation kept you from medical appointments, meetings, work or from getting things needed for daily living? (ref #5): No In the past 12 months, has the electric, gas, oil, or water company threatened to shut off services in your home? (ref #6): No How often does anyone, including family and friends, physically hurt you? (ref #7): Never (1) How often does anyone, including family and friends, insult or talk down to you? (ref #8): Never (1) How often does anyone, including friends and family, threaten you with harm? (ref #9): Never (1) How often does anyone, including family and friends, scream or curse at you? (ref #10): Never (1)  Engagement Notes Newton Pigg on 01/30/2021 01:40 PM HC Chart/ CP prep: 30 Minutes- LM 01/28/21 CPP Chart Review: 30 min CPP Office Visit: 43 min CPP Office Visit Documentation: 60 min CPP Coordination of Care: [facility] Care Plan Completion: 34 min CPP Care Plan Review:  Engagement Notes Newton Pigg on 01/30/2021 01:39 PM HC F/u: [facility] Onboarding  CPP F/u: 02/26/21: Ok [facility] w [facility] 09/30/21: Ok [facility] w [facility] 12/03/21 @ 9am: CCM phone follow up (insomnia, LDL)  Care Gaps:  COVID Vaccine: Received moderna 2 doses in March 2021 Shingrix: Pt educated              Rachelle Hora. Jeannett Senior, PharmD  Clinical Pharmacist  [email or web address]  [phone]

## 2021-02-04 ENCOUNTER — Telehealth: Payer: Self-pay

## 2021-02-04 DIAGNOSIS — E782 Mixed hyperlipidemia: Secondary | ICD-10-CM | POA: Diagnosis not present

## 2021-02-04 DIAGNOSIS — N182 Chronic kidney disease, stage 2 (mild): Secondary | ICD-10-CM | POA: Diagnosis not present

## 2021-02-04 DIAGNOSIS — G4733 Obstructive sleep apnea (adult) (pediatric): Secondary | ICD-10-CM | POA: Diagnosis not present

## 2021-02-04 DIAGNOSIS — I10 Essential (primary) hypertension: Secondary | ICD-10-CM | POA: Diagnosis not present

## 2021-02-04 NOTE — Telephone Encounter (Signed)
Called patient to start Pharmacy Onboarding form. Patient did not answer. [place].  Total time spent: 5 minutes Vanetta Shawl, [place]

## 2021-02-15 ENCOUNTER — Telehealth: Payer: Self-pay

## 2021-02-15 NOTE — Telephone Encounter (Signed)
Called patient to clarify which medications he needs refills on for pharmacy onboarding. Pt. Stated that he needs a refill on his Phentermine and Ezetimibe. Pt. has 4 tablets of phentermine left and 5 tablets left of his Ezetimibe. Pt. said those are the only RXs he needs a refill on at this time. Notified pharmacy onboarding team and CPP.  Total time spent: [place], [facility]

## 2021-02-25 ENCOUNTER — Encounter: Payer: Self-pay | Admitting: Internal Medicine

## 2021-02-25 ENCOUNTER — Other Ambulatory Visit: Payer: Self-pay | Admitting: Neurology

## 2021-02-25 NOTE — Progress Notes (Signed)
Annual  Screening/Preventative Visit  & Comprehensive Evaluation & Examination  Future Appointments  Date Time Provider Department  02/26/2021 11:00 AM [name] Pinto, MD [facility]  09/30/2021         Wellness  3:30 PM Magda Bernheim, NP [facility]  12/03/2021  9:00 AM Newton Pigg, [facility]  03/04/2022  2:00 PM [name] Pinto, MD [facility]            This very nice 71 y.o.  [name] presents for a Screening /Preventative Visit & comprehensive evaluation and management of multiple medical co-morbidities.  Patient has been followed for HTN, ASCAD/Stents, HLD, Prediabetes and Vitamin D Deficiency. Patient has severe OSA on CPAP followed by Dr Brett Fairy and also uses Ambien on a regular basis. Abd CT scan in 2018 showed Aorto-Iliac  Atherosclerosis. Patient has been followed by Dr Ellene Route for Cx EDSI's for Cx DDD/       HTN predates since 1990. Patient's BP has been controlled at home.  Patient had PCA/Stent rescue of ACS/NSTEMI in 2003 (age 79 yo) and in 2004 & 2010, he had Ablations for pAfib. Today's BP: 138/82. Patient denies any cardiac symptoms as chest pain, palpitations, shortness of breath, dizziness or ankle swelling.       Patient's hyperlipidemia is controlled with diet and Rosuvastatin.  Patient denies myalgias or other medication SE's. Last lipids were at goal except elevated Trig's :  Lab Results  Component Value Date   CHOL 134 09/27/2020   HDL 44 09/27/2020   LDLCALC 61 09/27/2020   LDLDIRECT 59 11/07/2019   TRIG 231 (H) 09/27/2020   CHOLHDL 3.0 09/27/2020         Patient has hx/o  prediabetes (A1c 5.8% /2019) and patient denies reactive hypoglycemic symptoms, visual blurring, diabetic polys or paresthesias. Last A1c was at goal :   Lab Results  Component Value Date   HGBA1C 5.4 06/12/2020         Finally, patient has history of Vitamin D Deficiency ("26" /2008) and last vitamin D was at goal :   Lab Results  Component Value Date   VD25OH 62 06/12/2020      Current Outpatient Medications on File Prior to Visit  Medication Sig   buPROPion XL 150 MG  Take  1 tablet  Daily     VITAMIN D  5,000 u Take daily.   clopidogrel  75 MG tablet Take  1 tablet  Daily     ezetimibe 10 MG tablet Take  1 tablet  Daily     gabapentin 600 MG tablet Take  1/2 to 1 tablet  3 x /day  as needed for Pain     nebivolol  10 MG tablet TAKE ONE TABLET DAILY    NITROSTAT 0.4 MG SL tablet if needed for Angina    rosuvastatin 20 MG tablet Take 1 tablet daily.   sildenafil  100 MG tablet Take 1/2 to 1 tablet Daily as needed    telmisartan  80 MG tablet Take 1 tablet daily.   zolpidem (AMBIEN) 5 MG tablet Takes 1 tablet at Bedtime per Dr Brett Fairy    Allergies  Allergen Reactions   Lisinopril Cough    ACEi cough   Amoxicillin Hives and Swelling   Penicillins Swelling     Past Medical History:  Diagnosis Date   Allergy    CAD (coronary artery disease), native coronary artery    Cath 04/19/2001  normal Left main, moderate proximal disease, 80% stenosis distal LAD, 99% stenosis proximal  OM 1, 60% stenosis mid RCA, 70% stenosis ostial mid PDA  PCI with bare metal stent of OM1 same date 3.5 x 18 mm Multilink     Diverticulosis    GERD (gastroesophageal reflux disease)    Hernia    History of PSVT    [facility]bl[facility] in [facility]    Hyperlipidemia    Hypertension    Sleep apnea    Symptomatic PVCs    Vitamin D deficiency      Health Maintenance  Topic Date Due   COVID-19 Vaccine (1) Never done   Zoster Vaccines- Shingrix (1 of 2) Never done   TETANUS/TDAP  03/07/2026   Pneumonia Vaccine 65+  Completed   INFLUENZA VACCINE  Completed   Hepatitis C Screening  Completed   HPV VACCINES  Aged Out     Immunization History  Administered Date(s) Administered   Influenza Split 10/13/2013, 10/23/2014   Influenza, High Dose  01/09/2017, 10/27/2017, 11/21/2019, 09/27/2020   Influenza,inj,quad 01/13/2013   Influenza 10/07/2011   PPD Test  10/13/2013, 10/23/2014   Pneumococcal -13 10/13/2013   Pneumococcal -23 10/23/2014, 02/21/2020   Td 01/06/2006   Tdap 03/06/2016   Zoster, Live 09/22/2011    Last Colon - 05/26/2018 - Dr Hilarie Fredrickson - Recc 3 yr f/u Colon - due June 2023     Past Surgical History:  Procedure Laterality Date   ACHILLES TENDON REPAIR     left   APPENDECTOMY     CARDIAC ELECTROPHYSIOLOGY MAPPING AND ABLATION     COLONOSCOPY     CORONARY ANGIOPLASTY WITH STENT PLACEMENT     years ago    Social History   Socioeconomic History   Marital status: Married      Spouse name: Katrina   Number of children: 3  Occupational History   Retired   Tobacco Use   Smoking status: Never   Smokeless tobacco: Never  Vaping Use   Vaping Use: Never used  Substance Use Topics   Alcohol use: Yes    Comment: rarely   Drug use: No      ROS Constitutional: Denies fever, chills, weight loss/gain, headaches, insomnia,  night sweats or change in appetite. Does c/o fatigue. Eyes: Denies redness, blurred vision, diplopia, discharge, itchy or watery eyes.  ENT: Denies discharge, congestion, post nasal drip, epistaxis, sore throat, earache, hearing loss, dental pain, Tinnitus, Vertigo, Sinus pain or snoring.  Cardio: Denies chest pain, palpitations, irregular heartbeat, syncope, dyspnea, diaphoresis, orthopnea, PND, claudication or edema Respiratory: denies cough, dyspnea, DOE, pleurisy, hoarseness, laryngitis or wheezing.  Gastrointestinal: Denies dysphagia, heartburn, reflux, water brash, pain, cramps, nausea, vomiting, bloating, diarrhea, constipation, hematemesis, melena, hematochezia, jaundice or hemorrhoids Genitourinary: Denies dysuria, frequency, urgency, nocturia, hesitancy, discharge, hematuria or flank pain Musculoskeletal: Denies arthralgia, myalgia, stiffness, Jt. Swelling, pain, limp or strain/sprain. Denies Falls. Skin: Denies puritis, rash, hives, warts, acne, eczema or change in skin lesion Neuro: No  weakness, tremor, incoordination, spasms, paresthesia or pain Psychiatric: Denies confusion, memory loss or sensory loss. Denies Depression. Endocrine: Denies change in weight, skin, hair change, nocturia, and paresthesia, diabetic polys, visual blurring or hyper / hypo glycemic episodes.  Heme/Lymph: No excessive bleeding, bruising or enlarged lymph nodes.   Physical Exam  BP 138/82    Pulse 78    Temp 97.9 F (36.6 C)    Resp 16    Ht 5\' 10"  (1.778 m)    Wt 209 lb 9.6 oz (95.1 kg)    SpO2 96%  BMI 30.07 kg/m   General Appearance: Well nourished and well groomed and in no apparent distress.  Eyes: PERRLA, EOMs, conjunctiva no swelling or erythema, normal fundi and vessels. Sinuses: No frontal/maxillary tenderness ENT/Mouth: EACs patent / TMs  nl. Nares clear without erythema, swelling, mucoid exudates. Oral hygiene is good. No erythema, swelling, or exudate. Tongue normal, non-obstructing. Tonsils not swollen or erythematous. Hearing normal.  Neck: Supple, thyroid not palpable. No bruits, nodes or JVD. Respiratory: Respiratory effort normal.  BS equal and clear bilateral without rales, rhonci, wheezing or stridor. Cardio: Heart sounds are normal with regular rate and rhythm and no murmurs, rubs or gallops. Peripheral pulses are normal and equal bilaterally without edema. No aortic or femoral bruits. Chest: symmetric with normal excursions and percussion.  Abdomen: Soft, with Nl bowel sounds. Nontender, no guarding, rebound, hernias, masses, or organomegaly.  Lymphatics: Non tender without lymphadenopathy.  Musculoskeletal: Full ROM all peripheral extremities, joint stability, 5/5 strength, and normal gait. Skin: Warm and dry without rashes, lesions, cyanosis, clubbing or  ecchymosis.  Neuro: Cranial nerves intact, reflexes equal bilaterally. Normal muscle tone, no cerebellar symptoms. Sensation intact.  Pysch: Alert and oriented X 3 with normal affect, insight and judgment appropriate.    Assessment and Plan  1. Annual Preventative/Screening Exam    2. Essential hypertension  - EKG 12-Lead - [place], RETROPERITNL ABD,  LTD - Urinalysis, Routine w reflex microscopic - Microalbumin / creatinine urine ratio - CBC with Differential/Platelet - COMPLETE METABOLIC PANEL WITH GFR - Magnesium - TSH  3. Hyperlipidemia, mixed  - EKG 12-Lead - [place], RETROPERITNL ABD,  LTD - Lipid panel - TSH  4. Abnormal glucose  - EKG 12-Lead - [place], RETROPERITNL ABD,  LTD - Hemoglobin A1c - Insulin, random  5. Vitamin D deficiency  - VITAMIN D 25 Hydroxy   6. Coronary artery disease involving native coronary  artery of native heart without angina pectoris  - EKG 12-Lead - Lipid panel  7. OSA on CPAP   8. Aorto-iliac atherosclerosis ([place]) by Pelvic CT scan 03/2016  - EKG 12-Lead - [place], RETROPERITNL ABD,  LTD - Lipid panel  9. Obesity (BMI 30.0-34.9)   10. BPH with obstruction/lower urinary tract symptoms  - PSA  11. Prostate cancer screening  - PSA  12. Screening for ischemic heart disease  - EKG 12-Lead  13. FH: hypertension  - EKG 12-Lead - [place], RETROPERITNL ABD,  LTD  14. Screening for AAA (aortic abdominal aneurysm)  - [place], RETROPERITNL ABD,  LTD  15. Medication management  - Urinalysis, Routine w reflex microscopic - Microalbumin / creatinine urine ratio - CBC with Differential/Platelet - COMPLETE METABOLIC PANEL WITH GFR - Magnesium - Lipid panel - TSH - Hemoglobin A1c - Insulin, random - VITAMIN D 25 Hydroxy           Patient was counseled in prudent diet, weight control to achieve/maintain BMI less than 25, BP monitoring, regular exercise and medications as discussed.  Discussed med effects and SE's. Routine screening labs and tests as requested with regular follow-up as recommended. Over 40 minutes of exam, counseling, chart review and high complex critical decision making was performed   Kirtland Bouchard, MD

## 2021-02-25 NOTE — Patient Instructions (Signed)

## 2021-02-26 ENCOUNTER — Other Ambulatory Visit: Payer: Self-pay

## 2021-02-26 ENCOUNTER — Ambulatory Visit (INDEPENDENT_AMBULATORY_CARE_PROVIDER_SITE_OTHER): Payer: PPO | Admitting: Internal Medicine

## 2021-02-26 ENCOUNTER — Encounter: Payer: Self-pay | Admitting: Internal Medicine

## 2021-02-26 ENCOUNTER — Encounter: Payer: PPO | Admitting: Internal Medicine

## 2021-02-26 VITALS — BP 138/82 | HR 78 | Temp 97.9°F | Resp 16 | Ht 70.0 in | Wt 209.6 lb

## 2021-02-26 DIAGNOSIS — Z0000 Encounter for general adult medical examination without abnormal findings: Secondary | ICD-10-CM | POA: Diagnosis not present

## 2021-02-26 DIAGNOSIS — Z79899 Other long term (current) drug therapy: Secondary | ICD-10-CM | POA: Diagnosis not present

## 2021-02-26 DIAGNOSIS — N529 Male erectile dysfunction, unspecified: Secondary | ICD-10-CM

## 2021-02-26 DIAGNOSIS — Z8249 Family history of ischemic heart disease and other diseases of the circulatory system: Secondary | ICD-10-CM

## 2021-02-26 DIAGNOSIS — Z9989 Dependence on other enabling machines and devices: Secondary | ICD-10-CM

## 2021-02-26 DIAGNOSIS — E559 Vitamin D deficiency, unspecified: Secondary | ICD-10-CM | POA: Diagnosis not present

## 2021-02-26 DIAGNOSIS — E669 Obesity, unspecified: Secondary | ICD-10-CM

## 2021-02-26 DIAGNOSIS — I10 Essential (primary) hypertension: Secondary | ICD-10-CM

## 2021-02-26 DIAGNOSIS — G4733 Obstructive sleep apnea (adult) (pediatric): Secondary | ICD-10-CM

## 2021-02-26 DIAGNOSIS — I700 Atherosclerosis of aorta: Secondary | ICD-10-CM

## 2021-02-26 DIAGNOSIS — Z125 Encounter for screening for malignant neoplasm of prostate: Secondary | ICD-10-CM

## 2021-02-26 DIAGNOSIS — I708 Atherosclerosis of other arteries: Secondary | ICD-10-CM | POA: Diagnosis not present

## 2021-02-26 DIAGNOSIS — N401 Enlarged prostate with lower urinary tract symptoms: Secondary | ICD-10-CM

## 2021-02-26 DIAGNOSIS — I2510 Atherosclerotic heart disease of native coronary artery without angina pectoris: Secondary | ICD-10-CM

## 2021-02-26 DIAGNOSIS — E66811 Obesity, class 1: Secondary | ICD-10-CM

## 2021-02-26 DIAGNOSIS — R7309 Other abnormal glucose: Secondary | ICD-10-CM | POA: Diagnosis not present

## 2021-02-26 DIAGNOSIS — F32A Depression, unspecified: Secondary | ICD-10-CM

## 2021-02-26 DIAGNOSIS — N138 Other obstructive and reflux uropathy: Secondary | ICD-10-CM

## 2021-02-26 DIAGNOSIS — E782 Mixed hyperlipidemia: Secondary | ICD-10-CM | POA: Diagnosis not present

## 2021-02-26 DIAGNOSIS — Z136 Encounter for screening for cardiovascular disorders: Secondary | ICD-10-CM

## 2021-02-26 DIAGNOSIS — Z0001 Encounter for general adult medical examination with abnormal findings: Secondary | ICD-10-CM

## 2021-02-26 MED ORDER — CLOPIDOGREL BISULFATE 75 MG PO TABS: ORAL_TABLET | ORAL | 3 refills | Status: DC

## 2021-02-26 MED ORDER — TELMISARTAN 80 MG PO TABS: ORAL_TABLET | ORAL | 3 refills | Status: DC

## 2021-02-26 MED ORDER — EZETIMIBE 10 MG PO TABS: ORAL_TABLET | ORAL | 0 refills | Status: DC

## 2021-02-26 MED ORDER — GABAPENTIN 600 MG PO TABS: ORAL_TABLET | ORAL | 3 refills | Status: DC

## 2021-02-26 MED ORDER — EZETIMIBE 10 MG PO TABS: ORAL_TABLET | ORAL | 3 refills | Status: DC

## 2021-02-26 MED ORDER — BUPROPION HCL ER (XL) 150 MG PO TB24: ORAL_TABLET | ORAL | 3 refills | Status: DC

## 2021-02-26 MED ORDER — PHENTERMINE HCL 37.5 MG PO TABS: ORAL_TABLET | ORAL | 0 refills | Status: DC

## 2021-02-26 MED ORDER — ROSUVASTATIN CALCIUM 20 MG PO TABS: ORAL_TABLET | ORAL | 3 refills | Status: DC

## 2021-02-26 MED ORDER — PHENTERMINE HCL 37.5 MG PO TABS: ORAL_TABLET | ORAL | 1 refills | Status: DC

## 2021-02-26 MED ORDER — NEBIVOLOL HCL 10 MG PO TABS: ORAL_TABLET | ORAL | 3 refills | Status: DC

## 2021-02-26 MED ORDER — SILDENAFIL CITRATE 100 MG PO TABS: ORAL_TABLET | ORAL | 1 refills | Status: DC

## 2021-02-27 LAB — LIPID PANEL
Cholesterol: 135 mg/dL (ref <200)
HDL: 43 mg/dL (ref >=40)
LDL Cholesterol (Calc): 63 mg/dL (calc)
Non-HDL Cholesterol (Calc): 92 mg/dL (calc) (ref <130)
Total CHOL/HDL Ratio: 3.1 (calc) (ref <5.0)
Triglycerides: 249 mg/dL — ABNORMAL HIGH (ref <150)

## 2021-02-27 LAB — CBC WITH DIFFERENTIAL/PLATELET
Absolute Monocytes: 474 cells/uL (ref 200–950)
Basophils Absolute: 30 cells/uL (ref 0–200)
Basophils Relative: 0.5 %
Eosinophils Absolute: 312 cells/uL (ref 15–500)
Eosinophils Relative: 5.2 %
HCT: 47.5 % (ref 38.5–50.0)
Hemoglobin: 16 g/dL (ref 13.2–17.1)
Lymphs Abs: 1920 cells/uL (ref 850–3900)
MCH: 29.4 pg (ref 27.0–33.0)
MCHC: 33.7 g/dL (ref 32.0–36.0)
MCV: 87.3 fL (ref 80.0–100.0)
MPV: 10.6 fL (ref 7.5–12.5)
Monocytes Relative: 7.9 %
Neutro Abs: 3264 cells/uL (ref 1500–7800)
Neutrophils Relative %: 54.4 %
Platelets: 222 10*3/uL (ref 140–400)
RBC: 5.44 10*6/uL (ref 4.20–5.80)
RDW: 12.5 % (ref 11.0–15.0)
Total Lymphocyte: 32 %
WBC: 6 10*3/uL (ref 3.8–10.8)

## 2021-02-27 LAB — COMPLETE METABOLIC PANEL WITH GFR
AG Ratio: 1.8 (calc) (ref 1.0–2.5)
ALT: 28 U/L (ref 9–46)
AST: 27 U/L (ref 10–35)
Albumin: 4.5 g/dL (ref 3.6–5.1)
Alkaline phosphatase (APISO): 66 U/L (ref 35–144)
BUN: 13 mg/dL (ref 7–25)
CO2: 29 mmol/L (ref 20–32)
Calcium: 9.4 mg/dL (ref 8.6–10.3)
Chloride: 102 mmol/L (ref 98–110)
Creat: 1.16 mg/dL (ref 0.70–1.28)
Globulin: 2.5 g/dL (calc) (ref 1.9–3.7)
Glucose, Bld: 88 mg/dL (ref 65–99)
Potassium: 4.4 mmol/L (ref 3.5–5.3)
Sodium: 139 mmol/L (ref 135–146)
Total Bilirubin: 0.8 mg/dL (ref 0.2–1.2)
Total Protein: 7 g/dL (ref 6.1–8.1)
eGFR: 68 mL/min/{1.73_m2} (ref >=60)

## 2021-02-27 LAB — URINALYSIS, ROUTINE W REFLEX MICROSCOPIC
Bilirubin Urine: NEGATIVE
Glucose, UA: NEGATIVE
Hgb urine dipstick: NEGATIVE
Ketones, ur: NEGATIVE
Leukocytes,Ua: NEGATIVE
Nitrite: NEGATIVE
Protein, ur: NEGATIVE
Specific Gravity, Urine: 1.011 (ref 1.001–1.035)
pH: 6 (ref 5.0–8.0)

## 2021-02-27 LAB — INSULIN, RANDOM: Insulin: 16 u[IU]/mL

## 2021-02-27 LAB — MICROALBUMIN / CREATININE URINE RATIO
Creatinine, Urine: 89 mg/dL (ref 20–320)
Microalb Creat Ratio: 2 mcg/mg creat (ref <30)
Microalb, Ur: 0.2 mg/dL

## 2021-02-27 LAB — TSH: TSH: 3.01 mIU/L (ref 0.40–4.50)

## 2021-02-27 LAB — HEMOGLOBIN A1C
Hgb A1c MFr Bld: 5.4 % of total Hgb (ref <5.7)
Mean Plasma Glucose: 108 mg/dL
eAG (mmol/L): 6 mmol/L

## 2021-02-27 LAB — MAGNESIUM: Magnesium: 2 mg/dL (ref 1.5–2.5)

## 2021-02-27 LAB — VITAMIN D 25 HYDROXY (VIT D DEFICIENCY, FRACTURES): Vit D, 25-Hydroxy: 70 ng/mL (ref 30–100)

## 2021-02-27 LAB — PSA: PSA: 0.58 ng/mL (ref <=4.00)

## 2021-02-27 NOTE — Progress Notes (Signed)
=============================================================== °-   Test results slightly outside the reference range are not unusual. If there is anything important, I will review this with you,  otherwise it is considered normal test values.  If you have further questions,  please do not hesitate to contact me at the office or via My Chart.  =============================================================== ===============================================================  -  Total Chol = 135    Y& LDL Chol = 63   - Both  Excellent   - Very low risk for Heart Attack  / Stroke ============================================================ ============================================================  -  But Triglycerides (   249   ) or fats in blood are too high  (goal is less than 150)    - Recommend avoid fried & greasy foods,  sweets / candy,   - Avoid white rice  (brown or wild rice or Quinoa is OK),   - Avoid white potatoes  (sweet potatoes are OK)   - Avoid anything made from white flour  - bagels, doughnuts, rolls, buns, biscuits, white and   wheat breads, pizza crust and traditional  pasta made of white flour & egg white  - (vegetarian pasta or spinach or wheat pasta is OK).    - Multi-grain bread is OK - like multi-grain flat bread or  sandwich thins.   - Avoid alcohol in excess.   - Exercise is also important. =============================================================== ===============================================================  -  PSA - Very Low - Great ! =============================================================== ===============================================================  -   A1c -Normal  - No Diabetes - Great  ! =============================================================== ===============================================================  -  Vitamin D = 70 - Excellent - Please keep dose same   =============================================================== ===============================================================  -  All Else - CBC - Kidneys - Electrolytes - Liver - Magnesium & Thyroid    - all  Normal / OK =============================================================== ===============================================================  -  Keep up the [place] Work  !   =============================================================== ===============================================================

## 2021-03-04 ENCOUNTER — Encounter: Payer: Self-pay | Admitting: Cardiovascular Disease

## 2021-03-06 DIAGNOSIS — G4733 Obstructive sleep apnea (adult) (pediatric): Secondary | ICD-10-CM | POA: Diagnosis not present

## 2021-03-13 DIAGNOSIS — G4733 Obstructive sleep apnea (adult) (pediatric): Secondary | ICD-10-CM | POA: Diagnosis not present

## 2021-04-13 DIAGNOSIS — G4733 Obstructive sleep apnea (adult) (pediatric): Secondary | ICD-10-CM | POA: Diagnosis not present

## 2021-04-24 NOTE — Progress Notes (Deleted)
   PATIENT: Bryan [name] DOB: [birth year]  REASON FOR VISIT: follow up HISTORY FROM: patient  Virtual Visit via Telephone Note  I connected with Bryan [name] on 04/24/21 at  9:45 AM EDT by telephone and verified that I am speaking with the correct person using two identifiers.   I discussed the limitations, risks, security and privacy concerns of performing an evaluation and management service by telephone and the availability of in person appointments. I also discussed with the patient that there may be a patient responsible charge related to this service. The patient expressed understanding and agreed to proceed.   History of Present Illness:  04/24/21 ALL: Bryan [name] is a 71 y.o. male here today for follow up.     History (copied from previous note)   Observations/Objective:  Generalized: Well developed, in no acute distress  Mentation: Alert oriented to time, place, history taking. Follows all commands speech and language fluent   Assessment and Plan:  71 y.o. year old male  has a past medical history of Allergy, CAD (coronary artery disease), native coronary artery, Diverticulosis, GERD (gastroesophageal reflux disease), Hernia, History of PSVT, Hyperlipidemia, Hypertension, Sleep apnea, Symptomatic PVCs, and Vitamin D deficiency. here with  No diagnosis found.  No orders of the defined types were placed in this encounter.   No orders of the defined types were placed in this encounter.    Follow Up Instructions:  I discussed the assessment and treatment plan with the patient. The patient was provided an opportunity to ask questions and all were answered. The patient agreed with the plan and demonstrated an understanding of the instructions.   The patient was advised to call back or seek an in-person evaluation if the symptoms worsen or if the condition fails to improve as anticipated.  I provided *** minutes of non-face-to-face time during this encounter.  Patient located at their place of residence during [place] visit. Provider is in the office.    Debbora Presto, NP

## 2021-04-25 ENCOUNTER — Telehealth: Payer: PPO | Admitting: Family Medicine

## 2021-05-10 ENCOUNTER — Other Ambulatory Visit: Payer: Self-pay | Admitting: Neurology

## 2021-05-12 ENCOUNTER — Encounter: Payer: Self-pay | Admitting: Neurology

## 2021-05-13 ENCOUNTER — Other Ambulatory Visit: Payer: Self-pay | Admitting: Neurology

## 2021-05-13 DIAGNOSIS — G4733 Obstructive sleep apnea (adult) (pediatric): Secondary | ICD-10-CM | POA: Diagnosis not present

## 2021-05-13 MED ORDER — ZOLPIDEM TARTRATE 5 MG PO TABS: ORAL_TABLET | ORAL | 5 refills | Status: DC

## 2021-05-13 NOTE — Telephone Encounter (Signed)
Last OV was on 12/13/21.  ?Next OV is pending to be scheduled.  ?Last RX was written on 03/30/21 for 45 tabs.  ? ?[place] [facility] Database has been reviewed.  ?

## 2021-05-14 ENCOUNTER — Telehealth: Payer: Self-pay

## 2021-05-14 NOTE — Telephone Encounter (Signed)
LM-05/14/21-Calling pt. To complete CCM to [facility] transfer. Spoke w/ pt. And completed transfer. Informed pt. To continue following up w/ PCP as scheduled and to reach out to [place] if any new health concerns arise. Pt. Verbalized understanding and agreed. ? ?Total time spent: 7 min. ?

## 2021-05-24 NOTE — Progress Notes (Signed)
3 MONTH FOLLOW UP Assessment:   Diagnoses and all orders for this visit:  Aortoiliac atherosclerosis ([place]) Per CT 2018 Control blood pressure, cholesterol, glucose, increase exercise.   Essential hypertension BPs at goal  Monitor blood pressure at home; call if consistently over 130/80 Continue DASH diet.   Reminder to go to the ER if any CP, SOB, nausea, dizziness, severe HA, changes vision/speech, left arm numbness and tingling and jaw pain. CBC  History of PSVT Followed by cardiology, no recent recurrence; continue BB  Coronary artery disease involving native coronary artery of native heart, angina presence unspecified Control blood pressure, cholesterol, glucose, increase exercise.  Denies angina or nitro use Followed by cardiology  OSA (obstructive sleep apnea) Followed by Dr. Brett Fairy Doing well with CPAP  Weight loss encouraged  CKD (chronic kidney disease) stage 2, GFR 60-89 ml/min Increase fluids, avoid NSAIDS, monitor sugars, will monitor - CMP/GFR  Vitamin D deficiency Continue supplementation for goal of 60-100 Defer vitamin D level  Other abnormal glucose Recent A1Cs at goal Discussed diet/exercise, weight management  Defer A1C; check CMP  Overweight (25-29.9)  Long discussion about weight loss, diet, and exercise Recommended diet heavy in fruits and veggies and low in animal meats, cheeses, and dairy products, appropriate calorie intake Discussed appropriate weight for height  Patient on phentermine with benefit and no SE, taking drug breaks;  Recommend to reduce added sugars in diet; risks reviewed; set goal to stop daily soda Follow up at next visit  Medication management CBC, CMP/GFR  Primary insomnia Followed by Dr. Brett Fairy, prescribed Lorrin Mais  Continue with careful use of sleep aid agents  Mixed hyperlipidemia Triglycerides remain signicantly elevated; recently increased rosuvastatin to 20 mg, titrate up as needed, reduce creamer and  sugar in coffee Continue statin/zetia Continue low cholesterol diet and exercise.  Check lipid panel.   Erectile dysfunction Patient requesting to switch back to previous cialis 20 mg PRN Longer acting benefits discussed, risks with nitroglycerine reviewed Refill sent -   Orders Placed This Encounter  Procedures   CBC with Differential/Platelet   COMPLETE METABOLIC PANEL WITH GFR   Magnesium   Lipid panel   TSH      Over 30 minutes of exam, counseling, chart review, and critical decision making was performed  Future Appointments  Date Time Provider [place]  09/30/2021  3:30 PM Magda Bernheim, NP [facility] None  03/04/2022  2:00 PM [name] Pinto, MD [facility] None    Subjective:  Bryan [name] is a 71 y.o. male who presents for 3 month follow up for HTN, hyperlipidemia, glucose management, and vitamin D Def.   He retired this past year, enjoying spending more time with his 9 grandchildren.   He has ongoing difficulties with sleep, Dr. Brett Fairy had sleep study which showed severe OSA and recommended CPAP, has new machine since 02/2021, much quieter and reports excellent results. He reports has been advised short sleep syndrome, typically gets 4-6 hours with restorative sleep. Dr. Brett Fairy is prescribing Lorrin Mais to help with onset of sleep, takes with melatonin.  He has depression, tapered off of wellbutrin 150 mg daily and doing well since retirement.   BMI is Body mass index is 29.7 kg/m., he has been working on diet and exercise. Doing stretching exercises, walking, calisthenics, redoing yard, does a lot of outdoor activities, boating, etc. Also goes to the gym 3-4 days a week, moderate lifting.  He reports eating very little red meat, limits overall animal protein to 4-5 ounces/day.  He  reports 1/2 bottle of soda daily, sweets are a weakness, receptive to reducing.  Wt Readings from Last 3 Encounters:  05/28/21 207 lb (93.9 kg)  02/26/21 209 lb 9.6 oz (95.1 kg)   12/13/20 209 lb (94.8 kg)   He has hx of PSVT/PVCs but recently improved without episodes in several years.  Patient has hx/o ASCAD with ACS/acute Anterior NSTEMI and PCA w/Stent implantation in 2003, started on plavix.  In 2004 , he had an Ablation for pAfib. Stress Myoview was negative in 2010. Continues on plavix. Recently transitioned to seeing Dr. Burt Knack.   His blood pressure has been controlled at home, today their BP is BP: 110/78 BP Readings from Last 3 Encounters:  05/28/21 110/78  02/26/21 138/82  01/30/21 122/67   He does workout. He denies chest pain, shortness of breath, dizziness.   He is on cholesterol medication (zetia 10 mg daily, rosuvatation 20 mg) and denies myalgias. His cholesterol is at goal. The cholesterol last visit was:    Lab Results  Component Value Date   CHOL 135 02/26/2021   HDL 43 02/26/2021   LDLCALC 63 02/26/2021   LDLDIRECT 59 11/07/2019   TRIG 249 (H) 02/26/2021   CHOLHDL 3.1 02/26/2021   He has been working on diet and exercise for glucose management, and denies foot ulcerations, increased appetite, nausea, paresthesia of the feet, polydipsia, polyuria, visual disturbances, vomiting and weight loss. Last A1C in the office was:  Lab Results  Component Value Date   HGBA1C 5.4 02/26/2021   Last GFR Lab Results  Component Value Date   EGFR 68 02/26/2021    Patient is on Vitamin D supplement and at goal:    Lab Results  Component Value Date   VD25OH 41 02/26/2021     He has been prescribed sildenafil 100 mg PRN for ED, reports this doesn't seem to be working as well as previous cialis 20 mg PRN. Requesting to switch back -    Current Outpatient Medications on File Prior to Visit  Medication Sig Dispense Refill   Cholecalciferol (VITAMIN D) 50 MCG (2000 UT) CAPS Take 5,000 capsules by mouth daily.     clopidogrel (PLAVIX) 75 MG tablet Take  1 tablet  Daily  to Prevent Blood Clots 90 tablet 3   ezetimibe (ZETIA) 10 MG tablet Take  1  tablet  Daily  for Cholesterol 90 tablet 3   gabapentin (NEURONTIN) 600 MG tablet Take  1/2 to 1 tablet  3 x /day  as needed for Pain 270 tablet 3   nebivolol (BYSTOLIC) 10 MG tablet Take  1 tablet  Daily  for BP 90 tablet 3   nitroGLYCERIN (NITROSTAT) 0.4 MG SL tablet Dissolve 1 tablet under tongue every 3 to 5 minutes if needed for Angina 50 tablet 11   phentermine (ADIPEX-P) 37.5 MG tablet Take 1/2 to 1 tablet every Morning for Dieting & Weight Loss 90 tablet 1   rosuvastatin (CRESTOR) 20 MG tablet Take  1 tablet Daily for  Cholesterol 90 tablet 3   telmisartan (MICARDIS) 80 MG tablet Take  1 tablet  Daily  for BP 90 tablet 3   zolpidem (AMBIEN) 5 MG tablet TAKE ONE TABLET BY MOUTH EVERY EVENING AS NEEDED FOR SLEEP 30 tablet 5   No current facility-administered medications on file prior to visit.     Allergies: Allergies  Allergen Reactions   Lisinopril Cough    ACEi cough   Amoxicillin Hives and Swelling   Penicillins Swelling  Current Problems (verified) has Hyperlipidemia, mixed; Essential hypertension; Vitamin D deficiency; Abnormal glucose; Medication management; Coronary artery disease involving native coronary artery of native heart without angina pectoris; History of PSVT; Symptomatic PVCs; CKD (chronic kidney disease) stage 2, GFR 60-89 ml/min; Obesity (BMI 30.0-34.9); Insomnia; ED (erectile dysfunction); Aorto-iliac atherosclerosis ([place]) by Pelvic CT scan 03/2016; Psychophysiological insomnia; Dental anomaly; Severe obstructive sleep apnea-hypopnea syndrome; OSA on CPAP; Chronic intermittent hypoxia with obstructive sleep apnea; Intolerance of continuous positive airway pressure (CPAP) ventilation; Short sleeper; and Dependence on CPAP ventilation on their problem list.  Surgical: He  has a past surgical history that includes Appendectomy; Achilles tendon repair; Cardiac electrophysiology mapping and ablation; Coronary angioplasty with stent; and Colonoscopy. Family His  family history includes Brain cancer in his father; Colon polyps in his mother; Colonic polyp in his mother; Hypothyroidism in his mother; Irritable bowel syndrome in his mother. Social history  He reports that he has never smoked. He has never used smokeless tobacco. He reports current alcohol use. He reports that he does not use drugs.   Review of Systems  Constitutional:  Negative for chills, fever and weight loss.  HENT:  Negative for congestion and hearing loss.   Eyes:  Negative for blurred vision and double vision.  Respiratory:  Negative for cough and shortness of breath.   Cardiovascular:  Negative for chest pain, palpitations, orthopnea and leg swelling.  Gastrointestinal:  Negative for abdominal pain, constipation, diarrhea, heartburn, nausea and vomiting.  Musculoskeletal:  Negative for falls, joint pain and myalgias.  Skin:  Negative for rash.  Neurological:  Negative for dizziness, tingling, tremors, loss of consciousness and headaches.  Psychiatric/Behavioral:  Negative for depression, memory loss and suicidal ideas. The patient has insomnia.     Objective:   Today's Vitals   05/28/21 1001  BP: 110/78  Pulse: 71  Temp: 97.9 F (36.6 C)  SpO2: 99%  Weight: 207 lb (93.9 kg)    Body mass index is 29.7 kg/m.  General Appearance: Well nourished, in no apparent distress. Eyes: PERRLA, EOMs, conjunctiva no swelling or erythema Sinuses: No Frontal/maxillary tenderness ENT/Mouth: Ext aud canals clear, TMs without erythema, bulging. No erythema, swelling, or exudate on post pharynx.  Tonsils not swollen or erythematous. Hearing normal.  Neck: Supple, thyroid normal.  Respiratory: Respiratory effort normal, BS equal bilaterally without rales, rhonchi, wheezing or stridor.  Cardio: RRR with no MRGs. Brisk peripheral pulses without edema.  Abdomen: Soft, + BS.  Non tender, no guarding, rebound, hernias, masses. Lymphatics: Non tender without lymphadenopathy.   Musculoskeletal: Full ROM, 5/5 strength, Normal gait.  Skin: Warm, dry without rashes, lesions, ecchymosis.  Neuro: Cranial nerves intact. No cerebellar symptoms.  Psych: Awake and oriented X 3, normal affect, Insight and Judgment appropriate.   Izora Ribas, NP 10:40 AM [facility]

## 2021-05-28 ENCOUNTER — Encounter: Payer: Self-pay | Admitting: Adult Health

## 2021-05-28 ENCOUNTER — Ambulatory Visit (INDEPENDENT_AMBULATORY_CARE_PROVIDER_SITE_OTHER): Payer: PPO | Admitting: Adult Health

## 2021-05-28 VITALS — BP 110/78 | HR 71 | Temp 97.9°F | Wt 207.0 lb

## 2021-05-28 DIAGNOSIS — G4733 Obstructive sleep apnea (adult) (pediatric): Secondary | ICD-10-CM

## 2021-05-28 DIAGNOSIS — N529 Male erectile dysfunction, unspecified: Secondary | ICD-10-CM

## 2021-05-28 DIAGNOSIS — E559 Vitamin D deficiency, unspecified: Secondary | ICD-10-CM | POA: Diagnosis not present

## 2021-05-28 DIAGNOSIS — I700 Atherosclerosis of aorta: Secondary | ICD-10-CM | POA: Diagnosis not present

## 2021-05-28 DIAGNOSIS — I10 Essential (primary) hypertension: Secondary | ICD-10-CM | POA: Diagnosis not present

## 2021-05-28 DIAGNOSIS — R7309 Other abnormal glucose: Secondary | ICD-10-CM | POA: Diagnosis not present

## 2021-05-28 DIAGNOSIS — E782 Mixed hyperlipidemia: Secondary | ICD-10-CM | POA: Diagnosis not present

## 2021-05-28 DIAGNOSIS — Z79899 Other long term (current) drug therapy: Secondary | ICD-10-CM

## 2021-05-28 DIAGNOSIS — F5101 Primary insomnia: Secondary | ICD-10-CM | POA: Diagnosis not present

## 2021-05-28 DIAGNOSIS — I471 Supraventricular tachycardia: Secondary | ICD-10-CM | POA: Diagnosis not present

## 2021-05-28 DIAGNOSIS — I708 Atherosclerosis of other arteries: Secondary | ICD-10-CM

## 2021-05-28 DIAGNOSIS — Z9989 Dependence on other enabling machines and devices: Secondary | ICD-10-CM

## 2021-05-28 DIAGNOSIS — E669 Obesity, unspecified: Secondary | ICD-10-CM

## 2021-05-28 MED ORDER — TADALAFIL 20 MG PO TABS: ORAL_TABLET | ORAL | 0 refills | Status: DC

## 2021-05-28 NOTE — Patient Instructions (Addendum)
Goals      DIET - REDUCE SUGAR INTAKE     Weight (lb) < 185 lb (83.9 kg)       Recommend cutting down on sugar intake  - the American Heart Association recommends no more than 9 teaspoons (38 g) of added sugar for men daily, and 6 teaspoons (25 g) for women. Added sugar can be in many things that you might not expect - salad dressings, bread that is not home made, "all natural" fruit juice, etc., most processed foods contain hidden sugars. Consider looking at labels and being aware of how much sugar you are consuming in a day. Less is always better for sugar; sugar reduces your body's immune response, and damages blood vessels, leading to increased risk of many diseases.

## 2021-05-29 LAB — COMPLETE METABOLIC PANEL WITH GFR
AG Ratio: 1.9 (calc) (ref 1.0–2.5)
ALT: 23 U/L (ref 9–46)
AST: 18 U/L (ref 10–35)
Albumin: 4.5 g/dL (ref 3.6–5.1)
Alkaline phosphatase (APISO): 66 U/L (ref 35–144)
BUN: 14 mg/dL (ref 7–25)
CO2: 25 mmol/L (ref 20–32)
Calcium: 9.6 mg/dL (ref 8.6–10.3)
Chloride: 107 mmol/L (ref 98–110)
Creat: 0.98 mg/dL (ref 0.70–1.28)
Globulin: 2.4 g/dL (calc) (ref 1.9–3.7)
Glucose, Bld: 97 mg/dL (ref 65–99)
Potassium: 4.7 mmol/L (ref 3.5–5.3)
Sodium: 141 mmol/L (ref 135–146)
Total Bilirubin: 0.7 mg/dL (ref 0.2–1.2)
Total Protein: 6.9 g/dL (ref 6.1–8.1)
eGFR: 83 mL/min/{1.73_m2} (ref >=60)

## 2021-05-29 LAB — CBC WITH DIFFERENTIAL/PLATELET
Absolute Monocytes: 552 cells/uL (ref 200–950)
Basophils Absolute: 50 cells/uL (ref 0–200)
Basophils Relative: 0.8 %
Eosinophils Absolute: 372 cells/uL (ref 15–500)
Eosinophils Relative: 6 %
HCT: 48.3 % (ref 38.5–50.0)
Hemoglobin: 16.3 g/dL (ref 13.2–17.1)
Lymphs Abs: 1742 cells/uL (ref 850–3900)
MCH: 29.8 pg (ref 27.0–33.0)
MCHC: 33.7 g/dL (ref 32.0–36.0)
MCV: 88.3 fL (ref 80.0–100.0)
MPV: 10.6 fL (ref 7.5–12.5)
Monocytes Relative: 8.9 %
Neutro Abs: 3484 cells/uL (ref 1500–7800)
Neutrophils Relative %: 56.2 %
Platelets: 218 10*3/uL (ref 140–400)
RBC: 5.47 10*6/uL (ref 4.20–5.80)
RDW: 12 % (ref 11.0–15.0)
Total Lymphocyte: 28.1 %
WBC: 6.2 10*3/uL (ref 3.8–10.8)

## 2021-05-29 LAB — LIPID PANEL
Cholesterol: 129 mg/dL (ref <200)
HDL: 47 mg/dL (ref >=40)
LDL Cholesterol (Calc): 55 mg/dL (calc)
Non-HDL Cholesterol (Calc): 82 mg/dL (calc) (ref <130)
Total CHOL/HDL Ratio: 2.7 (calc) (ref <5.0)
Triglycerides: 199 mg/dL — ABNORMAL HIGH (ref <150)

## 2021-05-29 LAB — TSH: TSH: 1.63 mIU/L (ref 0.40–4.50)

## 2021-05-29 LAB — MAGNESIUM: Magnesium: 1.9 mg/dL (ref 1.5–2.5)

## 2021-06-13 DIAGNOSIS — G4733 Obstructive sleep apnea (adult) (pediatric): Secondary | ICD-10-CM | POA: Diagnosis not present

## 2021-07-01 ENCOUNTER — Encounter: Payer: Self-pay | Admitting: Internal Medicine

## 2021-07-10 DIAGNOSIS — Z0100 Encounter for examination of eyes and vision without abnormal findings: Secondary | ICD-10-CM | POA: Diagnosis not present

## 2021-07-13 DIAGNOSIS — G4733 Obstructive sleep apnea (adult) (pediatric): Secondary | ICD-10-CM | POA: Diagnosis not present

## 2021-07-23 ENCOUNTER — Encounter: Payer: Self-pay | Admitting: Internal Medicine

## 2021-07-31 ENCOUNTER — Encounter: Payer: Self-pay | Admitting: Gastroenterology

## 2021-08-01 DIAGNOSIS — H00021 Hordeolum internum right upper eyelid: Secondary | ICD-10-CM | POA: Diagnosis not present

## 2021-08-13 DIAGNOSIS — G4733 Obstructive sleep apnea (adult) (pediatric): Secondary | ICD-10-CM | POA: Diagnosis not present

## 2021-08-27 ENCOUNTER — Encounter: Payer: Self-pay | Admitting: Gastroenterology

## 2021-08-27 ENCOUNTER — Telehealth: Payer: Self-pay

## 2021-08-27 ENCOUNTER — Other Ambulatory Visit: Payer: Self-pay | Admitting: Internal Medicine

## 2021-08-27 ENCOUNTER — Ambulatory Visit: Payer: PPO | Admitting: Gastroenterology

## 2021-08-27 VITALS — BP 124/78 | HR 70 | Ht 70.0 in | Wt 206.0 lb

## 2021-08-27 DIAGNOSIS — Z86010 Personal history of colonic polyps: Secondary | ICD-10-CM

## 2021-08-27 DIAGNOSIS — N529 Male erectile dysfunction, unspecified: Secondary | ICD-10-CM

## 2021-08-27 DIAGNOSIS — Z7902 Long term (current) use of antithrombotics/antiplatelets: Secondary | ICD-10-CM

## 2021-08-27 MED ORDER — TADALAFIL 20 MG PO TABS: ORAL_TABLET | ORAL | 1 refills | Status: DC

## 2021-08-27 MED ORDER — NA SULFATE-K SULFATE-MG SULF 17.5-3.13-1.6 GM/177ML PO SOLN: 1.0000 | Freq: Once | ORAL | 0 refills | Status: AC

## 2021-08-27 NOTE — Patient Instructions (Signed)
_______________________________________________________  If you are age 71 or older, your body mass index should be between 23-30. Your Body mass index is 29.56 kg/m. If this is out of the aforementioned range listed, please consider follow up with your Primary Care Provider.  If you are age 41 or younger, your body mass index should be between 19-25. Your Body mass index is 29.56 kg/m. If this is out of the aformentioned range listed, please consider follow up with your Primary Care Provider.   ________________________________________________________  The [facility] providers would like to encourage you to use [facility] to communicate with providers for non-urgent requests or questions.  Due to long hold times on the telephone, sending your provider a message by [facility] may be a faster and more efficient way to get a response.  Please allow 48 business hours for a response.  Please remember that this is for non-urgent requests.  _______________________________________________________  Bryan [name] have been scheduled for a colonoscopy. Please follow written instructions given to you at your visit today.  Please pick up your prep supplies at the pharmacy within the next 1-3 days. If you use inhalers (even only as needed), please bring them with you on the day of your procedure.

## 2021-08-27 NOTE — Telephone Encounter (Signed)
[facility] Pre-operative Risk Assessment     Request for surgical clearance:     Endoscopy Procedure  What type of surgery is being performed?     colonoscopy  When is this surgery scheduled?     09/05/2021  What type of clearance is required ?   Pharmacy  Are there any medications that need to be held prior to surgery and how long? Plavix - 5 days  Practice name and name of physician performing surgery?      [facility]  What is your office phone and fax number?      Phone- [phone]  [phone]  Anesthesia type (None, local, MAC, general) ?       MAC

## 2021-08-27 NOTE — Telephone Encounter (Signed)
   If needed his Plavix may be held for 5 days prior to his procedure.  Please resume as soon as hemostasis is achieved.   Preoperative team, please contact this patient and set up a phone call appointment for further cardiac evaluation.  Thank you for your help.  Jossie Ng. Jaloni Davoli NP-C    08/27/2021, 11:51 AM [place] Office [phone] Fax [phone]

## 2021-08-27 NOTE — Progress Notes (Signed)
Addendum: Reviewed and agree with assessment and management plan. Zacharey Jensen M, MD  

## 2021-08-27 NOTE — Telephone Encounter (Signed)
  Patient Consent for Virtual Visit        Bryan [name] has provided verbal consent on 08/27/2021 for a virtual visit (video or telephone).   CONSENT FOR VIRTUAL VISIT FOR:  Bryan [name]  By participating in this virtual visit I agree to the following:  I hereby voluntarily request, consent and authorize [facility] and its employed or contracted physicians, physician assistants, nurse practitioners or other licensed health care professionals (the Practitioner), to provide me with telemedicine health care services (the "Services") as deemed necessary by the treating Practitioner. I acknowledge and consent to receive the Services by the Practitioner via telemedicine. I understand that the telemedicine visit will involve communicating with the Practitioner through live audiovisual communication technology and the disclosure of certain medical information by electronic transmission. I acknowledge that I have been given the opportunity to request an in-person assessment or other available alternative prior to the telemedicine visit and am voluntarily participating in the telemedicine visit.  I understand that I have the right to withhold or withdraw my consent to the use of telemedicine in the course of my care at any time, without affecting my right to future care or treatment, and that the Practitioner or I may terminate the telemedicine visit at any time. I understand that I have the right to inspect all information obtained and/or recorded in the course of the telemedicine visit and may receive copies of available information for a reasonable fee.  I understand that some of the potential risks of receiving the Services via telemedicine include:  Delay or interruption in medical evaluation due to technological equipment failure or disruption; Information transmitted may not be sufficient (e.g. poor resolution of images) to allow for appropriate medical decision making by the Practitioner; and/or   In rare instances, security protocols could fail, causing a breach of personal health information.  Furthermore, I acknowledge that it is my responsibility to provide information about my medical history, conditions and care that is complete and accurate to the best of my ability. I acknowledge that Practitioner's advice, recommendations, and/or decision may be based on factors not within their control, such as incomplete or inaccurate data provided by me or distortions of diagnostic images or specimens that may result from electronic transmissions. I understand that the practice of medicine is not an exact science and that Practitioner makes no warranties or guarantees regarding treatment outcomes. I acknowledge that a copy of this consent can be made available to me via my patient portal ([facility]), or I can request a printed copy by calling the office of [facility].    I understand that my insurance will be billed for this visit.   I have read or had this consent read to me. I understand the contents of this consent, which adequately explains the benefits and risks of the Services being provided via telemedicine.  I have been provided ample opportunity to ask questions regarding this consent and the Services and have had my questions answered to my satisfaction. I give my informed consent for the services to be provided through the use of telemedicine in my medical care

## 2021-08-27 NOTE — Telephone Encounter (Signed)
Spoke with patient who is agreeable to do a tele visit on 8/28 @ 9:20 am. Med rec and consent have been done.

## 2021-08-27 NOTE — Progress Notes (Signed)
08/27/2021 Bryan [name] [id number] 07-Aug-1950   HISTORY OF PRESENT ILLNESS: This is a pleasant 71 year old male who is a patient of Dr. Vena Rua.  He is known him for colonoscopy.  Last one in May 2020 as below.  Due to 7 adenomatous polyps being removed he recommended repeat in 3 years.  He is on Plavix for the past 20 years after having coronary stent placed.  No new cardiac or medical issues.  States he has noticed a little bit of change in his bowel habits with some very minimal occasional constipation and some change in the appearance/consistency of the stool at times, but nothing major.  No rectal bleeding.  Colonoscopy 05/2018:  - The examined portion of the ileum was normal. - One 3 mm polyp in the cecum, removed with a cold snare. Resected and retrieved. - Two 3 to 5 mm polyps in the ascending colon, removed with a cold snare. Resected and retrieved. - Four 2 to 4 mm polyps in the transverse colon, removed with a cold snare. Resected and retrieved. - Diverticulosis in the sigmoid colon. - Internal hemorrhoids.  1. Surgical [P], colon, cecum, polyp - TUBULAR ADENOMA. - NO HIGH GRADE DYSPLASIA OR MALIGNANCY. 2. Surgical [P], colon, ascending, polyp (3) - TUBULAR ADENOMA (X3 FRAGMENTS). - NO HIGH GRADE DYSPLASIA OR MALIGNANCY. 3. Surgical [P], colon, transverse, polyp (4) - TUBULAR ADENOMA (X3 FRAGMENTS). - SESSILE SERRATED POLYP WITHOUT DYSPLASIA (X2 FRAGMENTS). - NO HIGH GRADE DYSPLASIA OR MALIGNANCY.  Past Medical History:  Diagnosis Date   Allergy    CAD (coronary artery disease), native coronary artery    Cath 04/19/2001  normal Left main, moderate proximal disease, 80% stenosis distal LAD, 99% stenosis proximal OM 1, 60% stenosis mid RCA, 70% stenosis ostial mid PDA  PCI with bare metal stent of OM1 same date 3.5 x 18 mm Multilink     Cardiac arrhythmia due to congenital heart disease    Colon polyps    Diverticulosis    GERD (gastroesophageal reflux disease)     Hernia    History of PSVT    Ablation done in 2004 at [facility]    Hyperlipidemia    Hypertension    Sleep apnea    Symptomatic PVCs    Vitamin D deficiency    Past Surgical History:  Procedure Laterality Date   ACHILLES TENDON REPAIR     left   APPENDECTOMY     CARDIAC ELECTROPHYSIOLOGY [place]     years ago    reports that he has never smoked. He has never used smokeless tobacco. He reports current alcohol use. He reports that he does not use drugs. family history includes Brain cancer in his father; Celiac disease in his brother; Colon polyps in his mother; Colonic polyp in his mother; Heart disease in his maternal grandfather; Hypothyroidism in his mother; Irritable bowel syndrome in his mother. Allergies  Allergen Reactions   Lisinopril Cough    ACEi cough   Amoxicillin Hives and Swelling   Penicillins Swelling      Outpatient Encounter Medications as of 08/27/2021  Medication Sig   Cholecalciferol (VITAMIN D) 50 MCG (2000 UT) CAPS Take 5,000 capsules by mouth daily.   clopidogrel (PLAVIX) 75 MG tablet Take  1 tablet  Daily  to Prevent Blood Clots   ezetimibe (ZETIA) 10 MG tablet Take  1 tablet  Daily  for Cholesterol  gabapentin (NEURONTIN) 600 MG tablet Take  1/2 to 1 tablet  3 x /day  as needed for Pain   nebivolol (BYSTOLIC) 10 MG tablet Take  1 tablet  Daily  for BP   nitroGLYCERIN (NITROSTAT) 0.4 MG SL tablet Dissolve 1 tablet under tongue every 3 to 5 minutes if needed for Angina   rosuvastatin (CRESTOR) 20 MG tablet Take  1 tablet Daily for  Cholesterol   tadalafil (CIALIS) 20 MG tablet Take 1 tab daily as needed for ED.   telmisartan (MICARDIS) 80 MG tablet Take  1 tablet  Daily  for BP   zolpidem (AMBIEN) 5 MG tablet TAKE ONE TABLET BY MOUTH EVERY EVENING AS NEEDED FOR SLEEP   phentermine (ADIPEX-P) 37.5 MG tablet Take 1/2 to 1 tablet every Morning for Dieting & Weight Loss  (Patient not taking: Reported on 08/27/2021)   No facility-administered encounter medications on file as of 08/27/2021.     REVIEW OF SYSTEMS  : All other systems reviewed and negative except where noted in the History of Present Illness.   PHYSICAL EXAM: BP 124/78   Pulse 70   Ht '5\' 10"'$  (1.778 m)   Wt 206 lb (93.4 kg)   BMI 29.56 kg/m  General: Well developed white male in no acute distress Head: Normocephalic and atraumatic Eyes:  Sclerae anicteric, conjunctiva pink. Ears: Normal auditory acuity Lungs: Clear throughout to auscultation; no W/R/R. Heart: Regular rate and rhythm; no M/R/G. Abdomen: Soft, non-distended.  BS present.  Non-tender. Rectal:  Will be done at the time of colonoscopy. Musculoskeletal: Symmetrical with no gross deformities  Skin: No lesions on visible extremities Extremities: No edema  Neurological: Alert oriented x 4, grossly non-focal Psychological:  Alert and cooperative. Normal mood and affect  ASSESSMENT AND PLAN: *Personal history of colon polyps with 7 adenomatous polyps removed in May 2020.  Repeat recommended a 3-year interval.  We will schedule Dr. Hilarie Fredrickson. *Chronic antiplatelet use with Plavix due to remote history of coronary stent about 20 years ago.  Hold Plavix 5 days before procedure - will instruct when and how to resume after procedure. Risks and benefits of procedure including bleeding, perforation, infection, missed lesions, medication reactions and possible hospitalization or surgery if complications occur explained. Additional rare but real risk of cardiovascular event such as heart attack or ischemia/infarct of other organs off of Plavix explained and need to seek urgent help if this occurs. Will communicate by phone or EMR with patient's prescribing provider, Dr. Melford Aase, to confirm that holding Plavix is reasonable in this case.     CC:  [name] Pinto, MD

## 2021-09-02 ENCOUNTER — Ambulatory Visit (INDEPENDENT_AMBULATORY_CARE_PROVIDER_SITE_OTHER): Payer: PPO | Admitting: Physician Assistant

## 2021-09-02 ENCOUNTER — Encounter: Payer: Self-pay | Admitting: Physician Assistant

## 2021-09-02 DIAGNOSIS — Z01810 Encounter for preprocedural cardiovascular examination: Secondary | ICD-10-CM

## 2021-09-02 NOTE — Progress Notes (Signed)
Virtual Visit via Telephone Note   Because of Bryan [name]'s co-morbid illnesses, he is at least at moderate risk for complications without adequate follow up.  This format is felt to be most appropriate for this patient at this time.  The patient did not have access to video technology/had technical difficulties with video requiring transitioning to audio format only (telephone).  All issues noted in this document were discussed and addressed.  No physical exam could be performed with this format.  Please refer to the patient's chart for his consent to telehealth for [facility].  Evaluation Performed:  Preoperative cardiovascular risk assessment _____________   Date:  09/02/2021   Patient ID:  Bryan [name], Bryan [name] 21-May-1950, MRN [id number] Patient Location:  Home Provider location:   Office  Primary Care Provider:  [name] Pinto, MD Primary Cardiologist:  Sherren Mocha, MD  Chief Complaint / Patient Profile   71 y.o. y/o male with a h/o  Coronary artery disease ACS in 2003 s/p (3.5 x 18 mm) BMS to OM1  Residual disease: Severe apical LAD, moderate RCA 60-70 with associated aneurysmal dilation Echo 10/2020: EF 60-65, trivial MR Supraventricular Tachycardia  S/p RFA at [facility] in the early 2000's PVCs Monitor 11/2020: Frequent PVCs (burden 6.7%) Hypertension Hyperlipidemia  who is pending colonoscopy on 09/05/2021 and presents today for telephonic preoperative cardiovascular risk assessment.  Request is to hold clopidogrel x5 days.  Past Medical History    Past Medical History:  Diagnosis Date   Allergy    CAD (coronary artery disease), native coronary artery    Cath 04/19/2001  normal Left main, moderate proximal disease, 80% stenosis distal LAD, 99% stenosis proximal OM 1, 60% stenosis mid RCA, 70% stenosis ostial mid PDA  PCI with bare metal stent of OM1 same date 3.5 x 18 mm Multilink     Cardiac arrhythmia due to congenital heart disease    Colon polyps     Diverticulosis    GERD (gastroesophageal reflux disease)    Hernia    History of PSVT    Ablation done in 2004 at [facility]    Hyperlipidemia    Hypertension    Sleep apnea    Symptomatic PVCs    Vitamin D deficiency    Past Surgical History:  Procedure Laterality Date   ACHILLES TENDON REPAIR     left   APPENDECTOMY     CARDIAC ELECTROPHYSIOLOGY MAPPING AND ABLATION     COLONOSCOPY     CORONARY ANGIOPLASTY WITH STENT PLACEMENT     years ago    Allergies  Allergies  Allergen Reactions   Lisinopril Cough    ACEi cough   Amoxicillin Hives and Swelling   Penicillins Swelling    History of Present Illness    Bryan [name] is a 71 y.o. male who presents via audio/video conferencing for a telehealth visit today.  Pt was last seen in cardiology clinic on 10/02/20 by Dr. Burt Knack.  At that time Bryan [name] was doing well. A monitor was obtained and showed 6.7% PVCs. An echocardiogram demonstrated normal EF.  The patient is now pending procedure as outlined above. Since his last visit, he he has done well without chest pain, shortness of breath, syncope.   Home Medications    Prior to Admission medications   Medication Sig Start Date End Date Taking? Authorizing Provider  Cholecalciferol (VITAMIN D) 50 MCG (2000 UT) CAPS Take 5,000 capsules by mouth daily.    [provider]  clopidogrel (PLAVIX) 75 MG tablet Take  1 tablet  Daily  to Prevent Blood Clots 02/26/21   [name] Pinto, MD  ezetimibe (ZETIA) 10 MG tablet Take  1 tablet  Daily  for Cholesterol 02/26/21   [name] Pinto, MD  gabapentin (NEURONTIN) 600 MG tablet Take  1/2 to 1 tablet  3 x /day  as needed for Pain 02/26/21   [name] Pinto, MD  nebivolol (BYSTOLIC) 10 MG tablet Take  1 tablet  Daily  for BP 02/26/21   [name] Pinto, MD  nitroGLYCERIN (NITROSTAT) 0.4 MG SL tablet Dissolve 1 tablet under tongue every 3 to 5 minutes if needed for Angina 08/15/19   [name] Pinto, MD  phentermine  (ADIPEX-P) 37.5 MG tablet Take 1/2 to 1 tablet every Morning for Dieting & Weight Loss 02/26/21   [name] Pinto, MD  rosuvastatin (CRESTOR) 20 MG tablet Take  1 tablet Daily for  Cholesterol 02/26/21   [name] Pinto, MD  tadalafil (CIALIS) 20 MG tablet Take  1/2 to 1 tablet   every 2 to 3 days   as needed for  XXXX 08/27/21   [name] Pinto, MD  telmisartan (MICARDIS) 80 MG tablet Take  1 tablet  Daily  for BP 02/26/21   [name] Pinto, MD  zolpidem (AMBIEN) 5 MG tablet TAKE ONE TABLET BY MOUTH EVERY EVENING AS NEEDED FOR SLEEP 05/13/21   Dohmeier, Asencion Partridge, MD    Physical Exam    Vital Signs:  Bryan [name] does not have vital signs available for review today.   Given telephonic nature of communication, physical exam is limited. AAOx3. NAD. Normal affect.  Speech and respirations are unlabored.  Accessory Clinical Findings    None  Assessment & Plan    1.  Preoperative Cardiovascular Risk Assessment:    Bryan [name] perioperative risk of a major cardiac event is 0.9% according to the Revised Cardiac Risk Index (RCRI).  Therefore, he is at low risk for perioperative complications.   His functional capacity is good at 4.31 METs according to the Duke Activity Status Index (DASI). Recommendations: According to ACC/AHA guidelines, no further cardiovascular testing needed.  The patient may proceed to surgery at acceptable risk.   Antiplatelet and/or Anticoagulation Recommendations: Clopidogrel (Plavix) can be held for 5 days prior to his surgery and resumed as soon as possible post op.   A copy of this note will be routed to requesting surgeon.  Time:   Today, I have spent 6 minutes with the patient with telehealth technology discussing medical history, symptoms, and management plan.     Bryan Dopp, PA-C 09/02/2021, 10:50 AM

## 2021-09-03 NOTE — Progress Notes (Signed)
Pt states he stopped taking Plavix Thursday of last week so he will actually be off it for 7 days.

## 2021-09-03 NOTE — Progress Notes (Signed)
Vaughan Basta It appears pt just received cardiac clearance for procedure scheduled later this week Please ensure Plavix will have been held x 5 days before procedure, if not, procedure will need to be rescheduled If Plavix hold correct he can proceed as scheduled after cardiology input Thanks JMP

## 2021-09-04 DIAGNOSIS — L814 Other melanin hyperpigmentation: Secondary | ICD-10-CM | POA: Diagnosis not present

## 2021-09-04 DIAGNOSIS — D1801 Hemangioma of skin and subcutaneous tissue: Secondary | ICD-10-CM | POA: Diagnosis not present

## 2021-09-04 DIAGNOSIS — D2262 Melanocytic nevi of left upper limb, including shoulder: Secondary | ICD-10-CM | POA: Diagnosis not present

## 2021-09-04 DIAGNOSIS — L570 Actinic keratosis: Secondary | ICD-10-CM | POA: Diagnosis not present

## 2021-09-04 DIAGNOSIS — L821 Other seborrheic keratosis: Secondary | ICD-10-CM | POA: Diagnosis not present

## 2021-09-04 DIAGNOSIS — L820 Inflamed seborrheic keratosis: Secondary | ICD-10-CM | POA: Diagnosis not present

## 2021-09-04 DIAGNOSIS — Z85828 Personal history of other malignant neoplasm of skin: Secondary | ICD-10-CM | POA: Diagnosis not present

## 2021-09-05 ENCOUNTER — Encounter: Payer: Self-pay | Admitting: Internal Medicine

## 2021-09-05 ENCOUNTER — Ambulatory Visit (AMBULATORY_SURGERY_CENTER): Payer: PPO | Admitting: Internal Medicine

## 2021-09-05 VITALS — BP 103/65 | HR 70 | Temp 97.5°F | Resp 14 | Ht 70.0 in | Wt 206.0 lb

## 2021-09-05 DIAGNOSIS — Z09 Encounter for follow-up examination after completed treatment for conditions other than malignant neoplasm: Secondary | ICD-10-CM | POA: Diagnosis not present

## 2021-09-05 DIAGNOSIS — D124 Benign neoplasm of descending colon: Secondary | ICD-10-CM

## 2021-09-05 DIAGNOSIS — Z86010 Personal history of colonic polyps: Secondary | ICD-10-CM | POA: Diagnosis not present

## 2021-09-05 DIAGNOSIS — I10 Essential (primary) hypertension: Secondary | ICD-10-CM | POA: Diagnosis not present

## 2021-09-05 DIAGNOSIS — I2510 Atherosclerotic heart disease of native coronary artery without angina pectoris: Secondary | ICD-10-CM | POA: Diagnosis not present

## 2021-09-05 DIAGNOSIS — D123 Benign neoplasm of transverse colon: Secondary | ICD-10-CM

## 2021-09-05 DIAGNOSIS — G4733 Obstructive sleep apnea (adult) (pediatric): Secondary | ICD-10-CM | POA: Diagnosis not present

## 2021-09-05 DIAGNOSIS — E669 Obesity, unspecified: Secondary | ICD-10-CM | POA: Diagnosis not present

## 2021-09-05 MED ORDER — SODIUM CHLORIDE 0.9 % IV SOLN: 500.0000 mL | Freq: Once | INTRAVENOUS | Status: DC

## 2021-09-05 NOTE — Patient Instructions (Signed)
Handouts given for polyps and diverticulosis.  RESUME ALL MEDICATIONS TODAY INCLUDING PLAVIX AT PREVIOUS DOSE!   YOU HAD AN ENDOSCOPIC PROCEDURE TODAY AT [facility]:   Refer to the procedure report that was given to you for any specific questions about what was found during the examination.  If the procedure report does not answer your questions, please call your gastroenterologist to clarify.  If you requested that your care partner not be given the details of your procedure findings, then the procedure report has been included in a sealed envelope for you to review at your convenience later.  YOU SHOULD EXPECT: Some feelings of bloating in the abdomen. Passage of more gas than usual.  Walking can help get rid of the air that was put into your GI tract during the procedure and reduce the bloating. If you had a lower endoscopy (such as a colonoscopy or flexible sigmoidoscopy) you may notice spotting of blood in your stool or on the toilet paper. If you underwent a bowel prep for your procedure, you may not have a normal bowel movement for a few days.  Please Note:  You might notice some irritation and congestion in your nose or some drainage.  This is from the oxygen used during your procedure.  There is no need for concern and it should clear up in a day or so.  SYMPTOMS TO REPORT IMMEDIATELY:  Following lower endoscopy (colonoscopy):  Excessive amounts of blood in the stool  Significant tenderness or worsening of abdominal pains  Swelling of the abdomen that is new, acute  Fever of 100F or higher  For urgent or emergent issues, a gastroenterologist can be reached at any hour by calling [phone]. Do not use MyChart messaging for urgent concerns.    DIET:  We do recommend a small meal at first, but then you may proceed to your regular diet.  Drink plenty of fluids but you should avoid alcoholic beverages for 24 hours.  ACTIVITY:  You should plan to take it easy for  the rest of today and you should NOT DRIVE or use heavy machinery until tomorrow (because of the sedation medicines used during the test).    FOLLOW UP: Our staff will call the number listed on your records the next business day following your procedure.  We will call around 7:15- 8:00 am to check on you and address any questions or concerns that you may have regarding the information given to you following your procedure. If we do not reach you, we will leave a message.  If you develop any symptoms (ie: fever, flu-like symptoms, shortness of breath, cough etc.) before then, please call [phone].  If you test positive for Covid 19 in the 2 weeks post procedure, please call and report this information to [place].    If any biopsies were taken you will be contacted by phone or by letter within the next 1-3 weeks.  Please call us at [phone] if you have not heard about the biopsies in 3 weeks.    SIGNATURES/CONFIDENTIALITY: You and/or your care partner have signed paperwork which will be entered into your electronic medical record.  These signatures attest to the fact that that the information above on your After Visit Summary has been reviewed and is understood.  Full responsibility of the confidentiality of this discharge information lies with you and/or your care-partner.

## 2021-09-05 NOTE — Progress Notes (Signed)
See office note dated 08/27/2021 for details and current H&P  Patient presenting for colonoscopy for personal history of multiple adenomatous and sessile serrated colon polyps, last exam May 2020 Plavix on hold x5 days after cardiology clearance  He remains appropriate for colonoscopy in the [facility] today.

## 2021-09-05 NOTE — Op Note (Addendum)
[facility] Patient Name: Bryan [name] Procedure Date: 09/05/2021 10:00 AM MRN: [id number] Endoscopist: Jerene Bears , MD Age: 71 Referring MD:  Date of Birth: [birth year] Gender: Male Account #: [id number] Procedure:                Colonoscopy Indications:              High risk colon cancer surveillance: Personal                            history of multiple adenomas/SSP, Family history of                            advanced adenoma of the colon in a first-degree                            relative (mother), Last colonoscopy: May 2020 (7                            polyps removed) Medicines:                Monitored Anesthesia Care Procedure:                Pre-Anesthesia Assessment:                           - Prior to the procedure, a History and Physical                            was performed, and patient medications and                            allergies were reviewed. The patient's tolerance of                            previous anesthesia was also reviewed. The risks                            and benefits of the procedure and the sedation                            options and risks were discussed with the patient.                            All questions were answered, and informed consent                            was obtained. Prior Anticoagulants: The patient has                            taken Plavix (clopidogrel), last dose was 7 days                            prior to procedure. ASA Grade Assessment: II - A  patient with mild systemic disease. After reviewing                            the risks and benefits, the patient was deemed in                            satisfactory condition to undergo the procedure.                           After obtaining informed consent, the colonoscope                            was passed under direct vision. Throughout the                            procedure, the patient's blood pressure,  pulse, and                            oxygen saturations were monitored continuously. The                            Olympus CF-HQ190L (Serial# [id number]) Colonoscope was                            introduced through the anus and advanced to the                            cecum, identified by appendiceal orifice and                            ileocecal valve. The colonoscopy was performed                            without difficulty. The patient tolerated the                            procedure well. The quality of the bowel                            preparation was good. The ileocecal valve,                            appendiceal orifice, and rectum were photographed. Scope In: 10:13:24 AM Scope Out: 10:25:47 AM Scope Withdrawal Time: 0 hours 10 minutes 57 seconds  Total Procedure Duration: 0 hours 12 minutes 23 seconds  Findings:                 The digital rectal exam was normal.                           A 6 mm polyp was found in the transverse colon. The                            polyp was sessile. The polyp was removed with  a                            cold snare. Resection and retrieval were complete.                           A 3 mm polyp was found in the descending colon. The                            polyp was sessile. The polyp was removed with a                            cold snare. Resection and retrieval were complete.                           A few small-mouthed diverticula were found in the                            sigmoid colon.                           Internal hemorrhoids were found during                            retroflexion. The hemorrhoids were small. Complications:            No immediate complications. Estimated Blood Loss:     Estimated blood loss was minimal. Impression:               - One 6 mm polyp in the transverse colon, removed                            with a cold snare. Resected and retrieved.                           - One 3 mm polyp in the  descending colon, removed                            with a cold snare. Resected and retrieved.                           - Diverticulosis in the sigmoid colon.                           - Small internal hemorrhoids. Recommendation:           - Patient has a contact number available for                            emergencies. The signs and symptoms of potential                            delayed complications were discussed with the  patient. Return to normal activities tomorrow.                            Written discharge instructions were provided to the                            patient.                           - Resume previous diet.                           - Continue present medications.                           - Resume Plavix (clopidogrel) at prior dose today.                            Refer to managing physician for further adjustment                            of therapy.                           - Await pathology results.                           - Repeat colonoscopy is recommended for                            surveillance. The colonoscopy date will be                            determined after pathology results from today's                            exam become available for review. Jerene Bears, MD 09/05/2021 10:31:14 AM This report has been signed electronically.

## 2021-09-05 NOTE — Progress Notes (Signed)
VS by EW  Pt's states no medical or surgical changes since previsit or office visit.

## 2021-09-05 NOTE — Progress Notes (Signed)
Report to PACU, RN, vss, BBS= Clear.  

## 2021-09-06 ENCOUNTER — Telehealth: Payer: Self-pay | Admitting: *Deleted

## 2021-09-06 DIAGNOSIS — G4733 Obstructive sleep apnea (adult) (pediatric): Secondary | ICD-10-CM | POA: Diagnosis not present

## 2021-09-06 NOTE — Telephone Encounter (Signed)
  Follow up Call-     09/05/2021    9:47 AM  Call back number  Post procedure Call Back phone  # [phone]  Permission to leave phone message Yes     Patient questions:  Do you have a fever, pain , or abdominal swelling? No. Pain Score  0 *  Have you tolerated food without any problems? Yes.    Have you been able to return to your normal activities? Yes.    Do you have any questions about your discharge instructions: Diet   No. Medications  No. Follow up visit  No.  Do you have questions or concerns about your Care? No.  Actions: * If pain score is 4 or above: No action needed, pain <4.

## 2021-09-13 DIAGNOSIS — G4733 Obstructive sleep apnea (adult) (pediatric): Secondary | ICD-10-CM | POA: Diagnosis not present

## 2021-09-16 ENCOUNTER — Encounter: Payer: Self-pay | Admitting: Internal Medicine

## 2021-09-29 NOTE — Progress Notes (Signed)
MEDICARE ANNUAL WELLNESS VISIT AND FOLLOW UP Assessment:   Diagnoses and all orders for this visit:  Encounter for Medicare annual wellness exam Due Annually  Aortoiliac atherosclerosis ([place]) Per CT 2018 Control blood pressure, cholesterol, glucose, increase exercise.   Symptomatic PVCs Has very rarely; symptoms stable recently.  Continue with medications, followed by cardiology.   Essential hypertension BPs at goal  Monitor blood pressure at home; call if consistently over 130/80 Continue DASH diet.   Reminder to go to the ER if any CP, SOB, nausea, dizziness, severe HA, changes vision/speech, left arm numbness and tingling and jaw pain. CBC  History of PSVT Followed by cardiology  Coronary artery disease involving native coronary artery of native heart, angina presence unspecified Control blood pressure, cholesterol, glucose, increase exercise.  Followed by cardiology  OSA (obstructive sleep apnea) Followed by Dr. Brett Fairy; recent sleep study recommended CPAP, pending titration  CKD (chronic kidney disease) stage 2, GFR 60-89 ml/min Increase fluids, avoid NSAIDS, monitor sugars, will monitor Microalbumin/creatinine urine ratio  Vitamin D deficiency Continue supplementation for goal of 70-100 Defer vitamin D level  Other abnormal glucose Recent A1Cs at goal Discussed diet/exercise, weight management  Defer A1C; check CMP  Overweight (25-29.9) Long discussion about weight loss, diet, and exercise Recommended diet heavy in fruits and veggies and low in animal meats, cheeses, and dairy products, appropriate calorie intake Discussed appropriate weight for height  Patient on phentermine with benefit and no SE, taking drug breaks;  Follow up at next visit  Medication management CBC, CMP/GFR  Primary insomnia Followed by Dr. Brett Fairy, prescribed Eulis Canner sleep hygiene discussed, increase day time activity Continue with careful use of sleep aid  agents  Mixed hyperlipidemia Triglycerides remain signicantly elevated; recently increased rosuvastatin to 20 mg, titrate up as needed, reduce creamer and sugar in coffee Continue statin/zetia Continue low cholesterol diet and exercise.  Check lipid panel.   Erectile dysfunction Continue cialis PRN  Flu vaccine Need - High dose flu vaccine given  Over 30 minutes of exam, counseling, chart review, and critical decision making was performed  Future Appointments  Date Time Provider [place]  03/04/2022  2:00 PM [name] Pinto, MD [facility] None    Plan:   During the course of the visit the patient was educated and counseled about appropriate screening and preventive services including:   Pneumococcal vaccine  Influenza vaccine Prevnar 13 Td vaccine Screening electrocardiogram Colorectal cancer screening Diabetes screening Glaucoma screening Nutrition counseling    Subjective:  Bryan [name] is a 71 y.o. male who presents for Medicare Annual Wellness Visit and 3 month follow up for HTN, hyperlipidemia, glucose management, and vitamin D Def.   He has ongoing difficulties with sleep,  Dr. Brett Fairy had sleep study which showed severe OSA and recommended CPAP. She is prescribing ambien.  He reports back and hip pain continues to have intermittently , gabapenitn does help. It is currently approved   BMI is Body mass index is 29.41 kg/m., he has been working on diet and exercise. Doing stretching exercises, walking, calisthenics, redoing yard, does a lot of outdoor activities, boating, etc. Works out 3 days a week with weight and walks a couple miles everyday Wt Readings from Last 3 Encounters:  09/30/21 205 lb (93 kg)  09/05/21 206 lb (93.4 kg)  08/27/21 206 lb (93.4 kg)   He has hx of PSVT/PVCs but recently improved without episodes in several years.  Patient has hx/o ASCAD with ACS/acute Anterior NSTEMI and PCA w/Stent implantation  in 2003. He is on plavix. In  2004 , he had an Ablation for pAfib. Stress Myoview was negative in 2010. Continues on plavix. Recently transitioned to seeing Dr. Burt Knack.   His blood pressure has been controlled at home, today their BP is BP: 122/76 BP Readings from Last 3 Encounters:  09/30/21 122/76  09/05/21 103/65  08/27/21 124/78    He does workout. He denies chest pain, shortness of breath, dizziness.   He is on cholesterol medication (zetia 10 mg daily, rosuvastatin 20 g, omega 3) and denies myalgias. His cholesterol is at goal. The cholesterol last visit was:    Lab Results  Component Value Date   CHOL 129 05/28/2021   HDL 47 05/28/2021   LDLCALC 55 05/28/2021   LDLDIRECT 59 11/07/2019   TRIG 199 (H) 05/28/2021   CHOLHDL 2.7 05/28/2021   He has been working on diet and exercise for glucose management, and denies foot ulcerations, increased appetite, nausea, paresthesia of the feet, polydipsia, polyuria, visual disturbances, vomiting and weight loss. Last A1C in the office was:  Lab Results  Component Value Date   HGBA1C 5.4 02/26/2021   He does drink a lot of fluids. Last GFR Lab Results  Component Value Date   EGFR 83 05/28/2021     Patient is on Vitamin D supplement and near 60:    Lab Results  Component Value Date   VD25OH 18 02/26/2021        Current Outpatient Medications on File Prior to Visit  Medication Sig Dispense Refill   Cholecalciferol (VITAMIN D) 50 MCG (2000 UT) CAPS Take by mouth.     clopidogrel (PLAVIX) 75 MG tablet Take  1 tablet  Daily  to Prevent Blood Clots 90 tablet 3   ezetimibe (ZETIA) 10 MG tablet Take  1 tablet  Daily  for Cholesterol 90 tablet 3   gabapentin (NEURONTIN) 600 MG tablet Take  1/2 to 1 tablet  3 x /day  as needed for Pain 270 tablet 3   nebivolol (BYSTOLIC) 10 MG tablet Take  1 tablet  Daily  for BP 90 tablet 3   nitroGLYCERIN (NITROSTAT) 0.4 MG SL tablet Dissolve 1 tablet under tongue every 3 to 5 minutes if needed for Angina 50 tablet 11   rosuvastatin  (CRESTOR) 20 MG tablet Take  1 tablet Daily for  Cholesterol 90 tablet 3   tadalafil (CIALIS) 20 MG tablet Take  1/2 to 1 tablet   every 2 to 3 days   as needed for  XXXX 30 tablet 1   telmisartan (MICARDIS) 80 MG tablet Take  1 tablet  Daily  for BP 90 tablet 3   zolpidem (AMBIEN) 5 MG tablet TAKE ONE TABLET BY MOUTH EVERY EVENING AS NEEDED FOR SLEEP 30 tablet 5   No current facility-administered medications on file prior to visit.     Allergies: Allergies  Allergen Reactions   Lisinopril Cough    ACEi cough   Amoxicillin Hives and Swelling   Penicillins Swelling    Current Problems (verified) has Hyperlipidemia, mixed; Essential hypertension; Vitamin D deficiency; Abnormal glucose; Antiplatelet or antithrombotic long-term use; Coronary artery disease involving native coronary artery of native heart without angina pectoris; History of PSVT; Symptomatic PVCs; CKD (chronic kidney disease) stage 2, GFR 60-89 ml/min; Obesity (BMI 30.0-34.9); Insomnia; ED (erectile dysfunction); Aorto-iliac atherosclerosis ([place]) by Pelvic CT scan 03/2016; Psychophysiological insomnia; Dental anomaly; Severe obstructive sleep apnea-hypopnea syndrome; OSA on CPAP; Chronic intermittent hypoxia with obstructive sleep apnea; Intolerance of  continuous positive airway pressure (CPAP) ventilation; Short sleeper; Dependence on CPAP ventilation; and History of colonic polyps on their problem list.  Screening Tests Immunization History  Administered Date(s) Administered   Influenza Split 10/13/2013, 10/23/2014   Influenza, High Dose Seasonal PF 01/09/2017, 10/27/2017, 11/21/2019, 09/27/2020   Influenza,inj,quad, With Preservative 01/13/2013   Influenza-Unspecified 10/07/2011   PPD Test 10/13/2013, 10/23/2014   Pneumococcal Conjugate-13 10/13/2013   Pneumococcal Polysaccharide-23 10/23/2014, 02/21/2020   Td 01/06/2006   Tdap 03/06/2016   Zoster, Live 09/22/2011   Preventative care: Last colonoscopy: 05/2018 next  due 05/2021 CXR 2019 MRI lumbar/hip 2017 Sleep study 04/2019  Prior vaccinations: TD or Tdap: 2018  Influenza:  Pneumococcal:02/21/20 Prevnar13: 2015 Shingles/Zostavax: 2013 Covid 19: 2/2, 2021, moderna   Names of Other Physician/Practitioners you currently use: 1. [facility] here for primary care 2. Lens, eye doctor, last visit 05/2021 3. Turner, dentist, last visit 09/04/21  Patient Care Team: [name] Pinto, MD as PCP - General (Internal Medicine) Sherren Mocha, MD as PCP - Cardiology (Cardiology) Newton Pigg, [facility] as Pharmacist (Pharmacist)  Surgical: He  has a past surgical history that includes Appendectomy; Achilles tendon repair; Cardiac electrophysiology mapping and ablation; Coronary angioplasty with stent; and Colonoscopy. Family His family history includes Brain cancer in his father; Celiac disease in his brother; Colon polyps in his mother; Colonic polyp in his mother; Heart disease in his maternal grandfather; Hypothyroidism in his mother; Irritable bowel syndrome in his mother. Social history  He reports that he has never smoked. He has never used smokeless tobacco. He reports current alcohol use. He reports that he does not use drugs.  MEDICARE WELLNESS OBJECTIVES: Physical activity: Current Exercise Habits: Home exercise routine, Type of exercise: walking, Time (Minutes): 40, Frequency (Times/Week): 5, Weekly Exercise (Minutes/Week): 200, Intensity: Mild Cardiac risk factors: Cardiac Risk Factors include: advanced age (>39men, >19 women);dyslipidemia;hypertension;male gender Depression/mood screen:      09/30/2021    3:50 PM  Depression screen PHQ 2/9  Decreased Interest 1  Down, Depressed, Hopeless 0  PHQ - 2 Score 1    ADLs:     09/30/2021    3:49 PM 02/25/2021   10:40 PM  In your present state of health, do you have any difficulty performing the following activities:  Hearing? 0 0  Vision? 0 0  Difficulty  concentrating or making decisions? 0 0  Walking or climbing stairs? 0 0  Dressing or bathing? 0 0  Doing errands, shopping? 0 0     Cognitive Testing  Alert? Yes  Normal Appearance?Yes  Oriented to person? Yes  Place? Yes   Time? Yes  Recall of three objects?  Yes  Can perform simple calculations? Yes  Displays appropriate judgment?Yes  Can read the correct time from a watch face?Yes  EOL planning: Does Patient Have a Medical Advance Directive?: Yes Type of Advance Directive: Healthcare Power of Attorney, Living will Does patient want to make changes to medical advance directive?: No - Patient declined Copy of [place] in Chart?: No - copy requested  Review of Systems  Constitutional:  Negative for chills, fever and weight loss.  HENT:  Negative for congestion and hearing loss.   Eyes:  Negative for blurred vision and double vision.  Respiratory:  Negative for cough and shortness of breath.   Cardiovascular:  Negative for chest pain, palpitations, orthopnea and leg swelling.  Gastrointestinal:  Negative for abdominal pain, constipation, diarrhea, heartburn, nausea and vomiting.  Musculoskeletal:  Negative for  falls, joint pain and myalgias.  Skin:  Negative for rash.  Neurological:  Negative for dizziness, tingling, tremors, loss of consciousness and headaches.  Psychiatric/Behavioral:  Negative for depression, memory loss and suicidal ideas.      Objective:   Today's Vitals   09/30/21 1525  BP: 122/76  Pulse: 75  Temp: 97.9 F (36.6 C)  SpO2: 97%  Weight: 205 lb (93 kg)  Height: _0  (1.778 m)    Body mass index is 29.41 kg/m.  General Appearance: Well nourished, in no apparent distress. Eyes: PERRLA, EOMs, conjunctiva no swelling or erythema Sinuses: No Frontal/maxillary tenderness ENT/Mouth: Ext aud canals clear, TMs without erythema, bulging. No erythema, swelling, or exudate on post pharynx.  Tonsils not swollen or erythematous. Hearing  normal.  Neck: Supple, thyroid normal.  Respiratory: Respiratory effort normal, BS equal bilaterally without rales, rhonchi, wheezing or stridor.  Cardio: RRR with no MRGs. Brisk peripheral pulses without edema.  Abdomen: Soft, + BS.  Non tender, no guarding, rebound, hernias, masses. Lymphatics: Non tender without lymphadenopathy.  Musculoskeletal: Full ROM, 5/5 strength, Normal gait.  Skin: Warm, dry without rashes, lesions, ecchymosis.  Neuro: Cranial nerves intact. No cerebellar symptoms.  Psych: Awake and oriented X 3, normal affect, Insight and Judgment appropriate.   Medicare Attestation I have personally reviewed: The patient's medical and social history Their use of alcohol, tobacco or illicit drugs Their current medications and supplements The patient's functional ability including ADLs,fall risks, home safety risks, cognitive, and hearing and visual impairment Diet and physical activities Evidence for depression or mood disorders  The patient's weight, height, BMI, and visual acuity have been recorded in the chart.  I have made referrals, counseling, and provided education to the patient based on review of the above and I have provided the patient with a written personalized care plan for preventive services.     Magda Bernheim ANP-C  [facility]  09/30/2021

## 2021-09-30 ENCOUNTER — Ambulatory Visit (INDEPENDENT_AMBULATORY_CARE_PROVIDER_SITE_OTHER): Payer: PPO | Admitting: Nurse Practitioner

## 2021-09-30 ENCOUNTER — Encounter: Payer: Self-pay | Admitting: Nurse Practitioner

## 2021-09-30 VITALS — BP 122/76 | HR 75 | Temp 97.9°F | Ht 70.0 in | Wt 205.0 lb

## 2021-09-30 DIAGNOSIS — E782 Mixed hyperlipidemia: Secondary | ICD-10-CM | POA: Diagnosis not present

## 2021-09-30 DIAGNOSIS — E663 Overweight: Secondary | ICD-10-CM

## 2021-09-30 DIAGNOSIS — N529 Male erectile dysfunction, unspecified: Secondary | ICD-10-CM

## 2021-09-30 DIAGNOSIS — N182 Chronic kidney disease, stage 2 (mild): Secondary | ICD-10-CM | POA: Diagnosis not present

## 2021-09-30 DIAGNOSIS — R6889 Other general symptoms and signs: Secondary | ICD-10-CM | POA: Diagnosis not present

## 2021-09-30 DIAGNOSIS — I708 Atherosclerosis of other arteries: Secondary | ICD-10-CM

## 2021-09-30 DIAGNOSIS — G4733 Obstructive sleep apnea (adult) (pediatric): Secondary | ICD-10-CM

## 2021-09-30 DIAGNOSIS — F5101 Primary insomnia: Secondary | ICD-10-CM

## 2021-09-30 DIAGNOSIS — E559 Vitamin D deficiency, unspecified: Secondary | ICD-10-CM | POA: Diagnosis not present

## 2021-09-30 DIAGNOSIS — Z79899 Other long term (current) drug therapy: Secondary | ICD-10-CM | POA: Diagnosis not present

## 2021-09-30 DIAGNOSIS — I471 Supraventricular tachycardia: Secondary | ICD-10-CM

## 2021-09-30 DIAGNOSIS — Z23 Encounter for immunization: Secondary | ICD-10-CM

## 2021-09-30 DIAGNOSIS — Z0001 Encounter for general adult medical examination with abnormal findings: Secondary | ICD-10-CM | POA: Diagnosis not present

## 2021-09-30 DIAGNOSIS — I2510 Atherosclerotic heart disease of native coronary artery without angina pectoris: Secondary | ICD-10-CM

## 2021-09-30 DIAGNOSIS — R7309 Other abnormal glucose: Secondary | ICD-10-CM | POA: Diagnosis not present

## 2021-09-30 DIAGNOSIS — I10 Essential (primary) hypertension: Secondary | ICD-10-CM

## 2021-09-30 DIAGNOSIS — I493 Ventricular premature depolarization: Secondary | ICD-10-CM

## 2021-09-30 DIAGNOSIS — Z0000 Encounter for general adult medical examination without abnormal findings: Secondary | ICD-10-CM

## 2021-10-01 LAB — COMPLETE METABOLIC PANEL WITH GFR
AG Ratio: 1.7 (calc) (ref 1.0–2.5)
ALT: 28 U/L (ref 9–46)
AST: 22 U/L (ref 10–35)
Albumin: 4.5 g/dL (ref 3.6–5.1)
Alkaline phosphatase (APISO): 61 U/L (ref 35–144)
BUN: 12 mg/dL (ref 7–25)
CO2: 29 mmol/L (ref 20–32)
Calcium: 9.6 mg/dL (ref 8.6–10.3)
Chloride: 102 mmol/L (ref 98–110)
Creat: 0.99 mg/dL (ref 0.70–1.28)
Globulin: 2.6 g/dL (calc) (ref 1.9–3.7)
Glucose, Bld: 92 mg/dL (ref 65–99)
Potassium: 4.9 mmol/L (ref 3.5–5.3)
Sodium: 139 mmol/L (ref 135–146)
Total Bilirubin: 0.9 mg/dL (ref 0.2–1.2)
Total Protein: 7.1 g/dL (ref 6.1–8.1)
eGFR: 81 mL/min/{1.73_m2} (ref >=60)

## 2021-10-01 LAB — CBC WITH DIFFERENTIAL/PLATELET
Absolute Monocytes: 561 cells/uL (ref 200–950)
Basophils Absolute: 38 cells/uL (ref 0–200)
Basophils Relative: 0.6 %
Eosinophils Absolute: 302 cells/uL (ref 15–500)
Eosinophils Relative: 4.8 %
HCT: 47.1 % (ref 38.5–50.0)
Hemoglobin: 16.5 g/dL (ref 13.2–17.1)
Lymphs Abs: 1789 cells/uL (ref 850–3900)
MCH: 30.1 pg (ref 27.0–33.0)
MCHC: 35 g/dL (ref 32.0–36.0)
MCV: 85.9 fL (ref 80.0–100.0)
MPV: 10.9 fL (ref 7.5–12.5)
Monocytes Relative: 8.9 %
Neutro Abs: 3610 cells/uL (ref 1500–7800)
Neutrophils Relative %: 57.3 %
Platelets: 245 10*3/uL (ref 140–400)
RBC: 5.48 10*6/uL (ref 4.20–5.80)
RDW: 11.8 % (ref 11.0–15.0)
Total Lymphocyte: 28.4 %
WBC: 6.3 10*3/uL (ref 3.8–10.8)

## 2021-10-01 LAB — LIPID PANEL
Cholesterol: 148 mg/dL (ref <200)
HDL: 48 mg/dL (ref >=40)
LDL Cholesterol (Calc): 72 mg/dL (calc)
Non-HDL Cholesterol (Calc): 100 mg/dL (calc) (ref <130)
Total CHOL/HDL Ratio: 3.1 (calc) (ref <5.0)
Triglycerides: 190 mg/dL — ABNORMAL HIGH (ref <150)

## 2021-10-01 LAB — HEMOGLOBIN A1C
Hgb A1c MFr Bld: 5.4 % of total Hgb (ref <5.7)
Mean Plasma Glucose: 108 mg/dL
eAG (mmol/L): 6 mmol/L

## 2021-10-13 DIAGNOSIS — G4733 Obstructive sleep apnea (adult) (pediatric): Secondary | ICD-10-CM | POA: Diagnosis not present

## 2021-10-22 ENCOUNTER — Encounter: Payer: Self-pay | Admitting: Neurology

## 2021-10-29 ENCOUNTER — Ambulatory Visit (INDEPENDENT_AMBULATORY_CARE_PROVIDER_SITE_OTHER): Payer: PPO | Admitting: Nurse Practitioner

## 2021-10-29 ENCOUNTER — Encounter: Payer: Self-pay | Admitting: Nurse Practitioner

## 2021-10-29 VITALS — BP 118/74 | HR 63 | Temp 97.3°F | Ht 70.0 in | Wt 214.2 lb

## 2021-10-29 DIAGNOSIS — R109 Unspecified abdominal pain: Secondary | ICD-10-CM | POA: Diagnosis not present

## 2021-10-29 DIAGNOSIS — R071 Chest pain on breathing: Secondary | ICD-10-CM | POA: Diagnosis not present

## 2021-10-29 NOTE — Progress Notes (Signed)
Assessment and Plan:  Bryan [name] was seen today for an episodic visit.  Diagnoses and all order for this visit:  1. Right flank pain Continue to stay well hydrated.  - US Renal; Future - Urinalysis, Routine w reflex microscopic  2. Pain aggravated by breathing  - US Renal; Future   Continue to monitor for any increase in fever, chills, N/V, diarrhea, changes to bowel habits, blood in stool or urine.  Report to ER if pain is uncontrolled.  Notify office for further evaluation and treatment, questions or concerns if s/s fail to improve. The risks and benefits of my recommendations, as well as other treatment options were discussed with the patient today. Questions were answered.  Further disposition pending results of labs. Discussed med's effects and SE's.    Over 20 minutes of exam, counseling, chart review, and critical decision making was performed.   Future Appointments  Date Time Provider [place]  10/30/2021  2:00 PM [facility] 2 [facility] [place]  03/04/2022  2:00 PM [name] Pinto, MD [facility] None    ------------------------------------------------------------------------------------------------------------------   HPI BP 118/74   Pulse 63   Temp (!) 97.3 F (36.3 C)   Ht '5\' 10"'$  (1.778 m)   Wt 214 lb 3.2 oz (97.2 kg)   SpO2 99%   BMI 30.73 kg/m   71 y.o.male presents for evaluation of right side flank pain that worsens with breathing during inspiration.  Pain can radiate around to right groin, and down towards right posterior leg.  Concentrated mostly just above the lip of the iliac crest.  Pain has been present for 2 weeks.  Report hx of appendicitis.  He endorses a hx of sciatica pain that is similar with radiation to side of abdomen and down posterior hip but has not been associated with pain upon inspiration. He has taken Meloxicam, Ibu, Gabapentin these last two weeks without much relief.  He has had steroid injection before to treat sciatica but  feels as though the pain is somewhat different that the sciatica pain.  He denies any recent trauma, fall, injury.  He does not fall while skiing 3 years ago.  He does work out daily but does not associate any recent injury to that area when lifting or working out.  He denies fever, chills, N/V, constipation, diarrhea.  Denies dysuria, hematuria, melena.  He reports no hx of nephrolithiasis.    Past Medical History:  Diagnosis Date   Allergy    CAD (coronary artery disease), native coronary artery    Cath 04/19/2001  normal Left main, moderate proximal disease, 80% stenosis distal LAD, 99% stenosis proximal OM 1, 60% stenosis mid RCA, 70% stenosis ostial mid PDA  PCI with bare metal stent of OM1 same date 3.5 x 18 mm Multilink     Cardiac arrhythmia due to congenital heart disease    Colon polyps    Diverticulosis    GERD (gastroesophageal reflux disease)    Hernia    History of PSVT    Ablation done in 2004 at [facility]    Hyperlipidemia    Hypertension    Sleep apnea    Symptomatic PVCs    Vitamin D deficiency      Allergies  Allergen Reactions   Lisinopril Cough    ACEi cough   Amoxicillin Hives and Swelling   Penicillins Swelling    Current Outpatient Medications on File Prior to Visit  Medication Sig   Cholecalciferol (VITAMIN D) 50 MCG (2000 UT) CAPS Take  by mouth.   clopidogrel (PLAVIX) 75 MG tablet Take  1 tablet  Daily  to Prevent Blood Clots   ezetimibe (ZETIA) 10 MG tablet Take  1 tablet  Daily  for Cholesterol   gabapentin (NEURONTIN) 600 MG tablet Take  1/2 to 1 tablet  3 x /day  as needed for Pain   nebivolol (BYSTOLIC) 10 MG tablet Take  1 tablet  Daily  for BP   nitroGLYCERIN (NITROSTAT) 0.4 MG SL tablet Dissolve 1 tablet under tongue every 3 to 5 minutes if needed for Angina   rosuvastatin (CRESTOR) 20 MG tablet Take  1 tablet Daily for  Cholesterol   tadalafil (CIALIS) 20 MG tablet Take  1/2 to 1 tablet   every 2 to 3 days   as needed for  XXXX    telmisartan (MICARDIS) 80 MG tablet Take  1 tablet  Daily  for BP   zolpidem (AMBIEN) 5 MG tablet TAKE ONE TABLET BY MOUTH EVERY EVENING AS NEEDED FOR SLEEP   No current facility-administered medications on file prior to visit.    ROS: all negative except what is noted in the HPI.   Physical Exam:  BP 118/74   Pulse 63   Temp (!) 97.3 F (36.3 C)   Ht '5\' 10"'$  (1.778 m)   Wt 214 lb 3.2 oz (97.2 kg)   SpO2 99%   BMI 30.73 kg/m   General Appearance: NAD.  Awake, conversant and cooperative. Eyes: PERRLA, EOMs intact.  Sclera white.  Conjunctiva without erythema. Sinuses: No frontal/maxillary tenderness.  No nasal discharge. Nares patent.  ENT/Mouth: Ext aud canals clear.  Bilateral TMs w/DOL and without erythema or bulging. Hearing intact.  Posterior pharynx without swelling or exudate.  Tonsils without swelling or erythema.  Neck: Supple.  No masses, nodules or thyromegaly. Respiratory: Effort is regular with non-labored breathing. Breath sounds are equal bilaterally without rales, rhonchi, wheezing or stridor.  Cardio: RRR with no MRGs. Brisk peripheral pulses without edema.  Abdomen: Active BS in all four quadrants.  Soft and non-tender without guarding, rebound tenderness, hernias or masses. Lymphatics: Non tender without lymphadenopathy.  Musculoskeletal: Right posterior CVA tenderness.  Tenderness to palpation along right iliac crest.   Skin: Appropriate color for ethnicity. Warm without rashes, lesions, ecchymosis, ulcers.  Neuro: CN II-XII grossly normal. Normal muscle tone without cerebellar symptoms and intact sensation.   Psych: AO X 3,  appropriate mood and affect, insight and judgment.     Darrol Jump, NP 4:05 PM [facility]

## 2021-10-29 NOTE — Patient Instructions (Signed)
Kidney Stones Kidney stones are rock-like masses that form inside of the kidneys. Kidneys are organs that make pee (urine). A kidney stone may move into other parts of the urinary tract, including: The tubes that connect the kidneys to the bladder (ureters). The bladder. The tube that carries urine out of the body (urethra). Kidney stones can cause very bad pain and can block the flow of pee. The stone usually leaves your body (passes) through your pee. You may need to have a doctor take out the stone. What are the causes? Kidney stones may be caused by: A condition in which certain glands make too much parathyroid hormone (primary hyperparathyroidism). A buildup of a type of crystals in the bladder made of a chemical called uric acid. The body makes uric acid when you eat certain foods. Narrowing (stricture) of one or both of the ureters. A kidney blockage that you were born with. Past surgery on the kidney or the ureters, such as gastric bypass surgery. What increases the risk? You are more likely to develop this condition if: You have had a kidney stone in the past. You have a family history of kidney stones. You do not drink enough water. You eat a diet that is high in protein, salt (sodium), or sugar. You are overweight or very overweight (obese). What are the signs or symptoms? Symptoms of a kidney stone may include: Pain in the side of the belly, right below the ribs (flank pain). Pain usually spreads (radiates) to the groin. Needing to pee often or right away (urgently). Pain when going pee (urinating). Blood in your pee (hematuria). Feeling like you may vomit (nauseous). Vomiting. Fever and chills. How is this treated? Treatment depends on the size, location, and makeup of the kidney stones. The stones will often pass out of the body through peeing. You may need to: Drink more fluid to help pass the stone. In some cases, you may be given fluids through an IV tube put into one  of your veins at the hospital. Take medicine for pain. Make changes in your diet to help keep kidney stones from coming back. Sometimes, medical procedures are needed to remove a kidney stone. This may involve: A procedure to break up kidney stones using a beam of light (laser) or shock waves. Surgery to remove the kidney stones. Follow these instructions at home: Medicines Take over-the-counter and prescription medicines only as told by your doctor. Ask your doctor if the medicine prescribed to you requires you to avoid driving or using heavy machinery. Eating and drinking Drink enough fluid to keep your pee pale yellow. You may be told to drink at least 8-10 glasses of water each day. This will help you pass the stone. If told by your doctor, change your diet. This may include: Limiting how much salt you eat. Eating more fruits and vegetables. Limiting how much meat, poultry, fish, and eggs you eat. Follow instructions from your doctor about eating or drinking restrictions. General instructions Collect pee samples as told by your doctor. You may need to collect a pee sample: 24 hours after a stone comes out. 8-12 weeks after a stone comes out, and every 6-12 months after that. Strain your pee every time you pee (urinate), for as long as told. Use the strainer that your doctor recommends. Do not throw out the stone. Keep it so that it can be tested by your doctor. Keep all follow-up visits as told by your doctor. This is important. You may need   follow-up tests. How is this prevented? To prevent another kidney stone: Drink enough fluid to keep your pee pale yellow. This is the best way to prevent kidney stones. Eat healthy foods. Avoid certain foods as told by your doctor. You may be told to eat less protein. Stay at a healthy weight. Where to find more information [place] ([facility]): www.kidney.[place] [facility]): www.urologyhealth.org Contact a doctor  if: You have pain that gets worse or does not get better with medicine. Get help right away if: You have a fever or chills. You get very bad pain. You get new pain in your belly (abdomen). You pass out (faint). You cannot pee. Summary Kidney stones are rock-like masses that form inside of the kidneys. Kidney stones can cause very bad pain and can block the flow of pee. The stones will often pass out of the body through peeing. Drink enough fluid to keep your pee pale yellow. This information is not intended to replace advice given to you by your health care provider. Make sure you discuss any questions you have with your health care provider. Document Revised: 08/27/2020 Document Reviewed: 08/27/2020 Elsevier Patient Education  [place].

## 2021-10-30 ENCOUNTER — Ambulatory Visit (HOSPITAL_COMMUNITY)
Admission: RE | Admit: 2021-10-30 | Discharge: 2021-10-30 | Disposition: A | Discharge status: Home / Self Care | Payer: PPO | Source: Ambulatory Visit | Attending: Nurse Practitioner | Admitting: Nurse Practitioner

## 2021-10-30 DIAGNOSIS — R109 Unspecified abdominal pain: Secondary | ICD-10-CM | POA: Insufficient documentation

## 2021-10-30 DIAGNOSIS — R071 Chest pain on breathing: Secondary | ICD-10-CM | POA: Insufficient documentation

## 2021-10-30 LAB — URINALYSIS, ROUTINE W REFLEX MICROSCOPIC
Bilirubin Urine: NEGATIVE
Glucose, UA: NEGATIVE
Hgb urine dipstick: NEGATIVE
Ketones, ur: NEGATIVE
Leukocytes,Ua: NEGATIVE
Nitrite: NEGATIVE
Protein, ur: NEGATIVE
Specific Gravity, Urine: 1.013 (ref 1.001–1.035)
pH: 5.5 (ref 5.0–8.0)

## 2021-10-31 ENCOUNTER — Other Ambulatory Visit: Payer: Self-pay | Admitting: Nurse Practitioner

## 2021-10-31 DIAGNOSIS — R109 Unspecified abdominal pain: Secondary | ICD-10-CM

## 2021-10-31 DIAGNOSIS — M5441 Lumbago with sciatica, right side: Secondary | ICD-10-CM

## 2021-10-31 DIAGNOSIS — M5136 Other intervertebral disc degeneration, lumbar region: Secondary | ICD-10-CM

## 2021-10-31 DIAGNOSIS — R071 Chest pain on breathing: Secondary | ICD-10-CM

## 2021-11-03 ENCOUNTER — Ambulatory Visit
Admission: RE | Admit: 2021-11-03 | Discharge: 2021-11-03 | Disposition: A | Discharge status: Home / Self Care | Payer: PPO | Source: Ambulatory Visit | Attending: Nurse Practitioner | Admitting: Nurse Practitioner

## 2021-11-03 DIAGNOSIS — M4126 Other idiopathic scoliosis, lumbar region: Secondary | ICD-10-CM | POA: Diagnosis not present

## 2021-11-03 DIAGNOSIS — R109 Unspecified abdominal pain: Secondary | ICD-10-CM

## 2021-11-03 DIAGNOSIS — M5136 Other intervertebral disc degeneration, lumbar region: Secondary | ICD-10-CM

## 2021-11-03 DIAGNOSIS — M4316 Spondylolisthesis, lumbar region: Secondary | ICD-10-CM | POA: Diagnosis not present

## 2021-11-03 DIAGNOSIS — M545 Low back pain, unspecified: Secondary | ICD-10-CM | POA: Diagnosis not present

## 2021-11-03 DIAGNOSIS — M5441 Lumbago with sciatica, right side: Secondary | ICD-10-CM

## 2021-11-03 DIAGNOSIS — M48061 Spinal stenosis, lumbar region without neurogenic claudication: Secondary | ICD-10-CM | POA: Diagnosis not present

## 2021-11-03 DIAGNOSIS — R071 Chest pain on breathing: Secondary | ICD-10-CM

## 2021-11-04 ENCOUNTER — Encounter: Payer: Self-pay | Admitting: Nurse Practitioner

## 2021-11-04 ENCOUNTER — Other Ambulatory Visit: Payer: Self-pay | Admitting: Nurse Practitioner

## 2021-11-04 DIAGNOSIS — M48061 Spinal stenosis, lumbar region without neurogenic claudication: Secondary | ICD-10-CM

## 2021-11-04 DIAGNOSIS — M5410 Radiculopathy, site unspecified: Secondary | ICD-10-CM

## 2021-11-04 DIAGNOSIS — M5136 Other intervertebral disc degeneration, lumbar region: Secondary | ICD-10-CM

## 2021-11-05 MED ORDER — CYCLOBENZAPRINE HCL 10 MG PO TABS: ORAL_TABLET | ORAL | 0 refills | Status: DC

## 2021-11-05 MED ORDER — PREDNISONE 20 MG PO TABS: ORAL_TABLET | ORAL | 0 refills | Status: DC

## 2021-11-08 DIAGNOSIS — Z6829 Body mass index (BMI) 29.0-29.9, adult: Secondary | ICD-10-CM | POA: Diagnosis not present

## 2021-11-08 DIAGNOSIS — M5416 Radiculopathy, lumbar region: Secondary | ICD-10-CM | POA: Diagnosis not present

## 2021-11-08 DIAGNOSIS — M48062 Spinal stenosis, lumbar region with neurogenic claudication: Secondary | ICD-10-CM | POA: Diagnosis not present

## 2021-11-13 DIAGNOSIS — G4733 Obstructive sleep apnea (adult) (pediatric): Secondary | ICD-10-CM | POA: Diagnosis not present

## 2021-11-21 DIAGNOSIS — M5416 Radiculopathy, lumbar region: Secondary | ICD-10-CM | POA: Diagnosis not present

## 2021-11-21 DIAGNOSIS — M5116 Intervertebral disc disorders with radiculopathy, lumbar region: Secondary | ICD-10-CM | POA: Diagnosis not present

## 2021-11-22 ENCOUNTER — Telehealth: Payer: Self-pay | Admitting: Neurology

## 2021-11-22 ENCOUNTER — Other Ambulatory Visit: Payer: Self-pay | Admitting: *Deleted

## 2021-11-22 MED ORDER — ZOLPIDEM TARTRATE 5 MG PO TABS: ORAL_TABLET | ORAL | 3 refills | Status: DC

## 2021-11-22 NOTE — Telephone Encounter (Signed)
Sent refill request to Dr. Brett Fairy to e-scribe.

## 2021-11-22 NOTE — Telephone Encounter (Signed)
Pt is calling. Requesting refill on medication zolpidem (AMBIEN) 5 MG tablet. Refill should be sent to  [place] [phone]

## 2021-12-03 ENCOUNTER — Telehealth: Payer: PPO | Admitting: Pharmacy Technician

## 2021-12-09 ENCOUNTER — Encounter: Payer: Self-pay | Admitting: Nurse Practitioner

## 2021-12-09 ENCOUNTER — Ambulatory Visit (INDEPENDENT_AMBULATORY_CARE_PROVIDER_SITE_OTHER): Payer: PPO | Admitting: Nurse Practitioner

## 2021-12-09 ENCOUNTER — Other Ambulatory Visit: Payer: Self-pay

## 2021-12-09 VITALS — BP 130/82 | HR 76 | Temp 97.6°F | Resp 17 | Ht 70.0 in | Wt 205.0 lb

## 2021-12-09 DIAGNOSIS — I10 Essential (primary) hypertension: Secondary | ICD-10-CM

## 2021-12-09 DIAGNOSIS — B370 Candidal stomatitis: Secondary | ICD-10-CM

## 2021-12-09 DIAGNOSIS — J0410 Acute tracheitis without obstruction: Secondary | ICD-10-CM | POA: Diagnosis not present

## 2021-12-09 DIAGNOSIS — G4483 Primary cough headache: Secondary | ICD-10-CM | POA: Diagnosis not present

## 2021-12-09 LAB — POC INFLUENZA A&B (BINAX/QUICKVUE)
Influenza A, POC: NEGATIVE
Influenza B, POC: NEGATIVE

## 2021-12-09 LAB — POC COVID19 BINAXNOW: SARS Coronavirus 2 Ag: NEGATIVE

## 2021-12-09 MED ORDER — BENZONATATE 100 MG PO CAPS: 100.0000 mg | ORAL_CAPSULE | Freq: Four times a day (QID) | ORAL | 1 refills | Status: DC | PRN

## 2021-12-09 MED ORDER — DEXAMETHASONE 4 MG PO TABS: ORAL_TABLET | ORAL | 0 refills | Status: DC

## 2021-12-09 MED ORDER — FLUCONAZOLE 150 MG PO TABS: ORAL_TABLET | ORAL | 0 refills | Status: DC

## 2021-12-09 MED ORDER — PROMETHAZINE-DM 6.25-15 MG/5ML PO SYRP: 5.0000 mL | ORAL_SOLUTION | Freq: Four times a day (QID) | ORAL | 1 refills | Status: DC | PRN

## 2021-12-09 NOTE — Progress Notes (Signed)
Assessment and Plan: Bryan [name] was seen today for cough.  Diagnoses and all orders for this visit:  Cough headache -     POC COVID-19 -     POC Influenza A&B (Binax test) -     promethazine-dextromethorphan (PROMETHAZINE-DM) 6.25-15 MG/5ML syrup; Take 5 mLs by mouth 4 (four) times daily as needed for cough. -     benzonatate (TESSALON PERLES) 100 MG capsule; Take 1 capsule (100 mg total) by mouth every 6 (six) hours as needed for cough.  Essential hypertension - continue medications, DASH diet, exercise and monitor at home. Call if greater than 130/80.  Go to the ER if any chest pain, shortness of breath, nausea, dizziness, severe HA, changes vision/speech  Tracheitis Use Mucinex as needed Push fluids Use taper of dexamethasone Tessalon perles during the day and Promethazine DM at night -     dexamethasone (DECADRON) 4 MG tablet; Take 3 tabs for 3 days, 2 tabs for 3 days 1 tab for 5 days. Take with food. -     promethazine-dextromethorphan (PROMETHAZINE-DM) 6.25-15 MG/5ML syrup; Take 5 mLs by mouth 4 (four) times daily as needed for cough. -     benzonatate (TESSALON PERLES) 100 MG capsule; Take 1 capsule (100 mg total) by mouth every 6 (six) hours as needed for cough.  Thrush Use Magic Mouthwash and Diflucan -     fluconazole (DIFLUCAN) 150 MG tablet; Take 1 tab every other day x 3 days       Further disposition pending results of labs. Discussed med's effects and SE's.   Over 30 minutes of exam, counseling, chart review, and critical decision making was performed.   Future Appointments  Date Time Provider [place]  02/26/2022  2:30 PM Dohmeier, Asencion Partridge, MD [facility] None  03/04/2022  2:00 PM [name] Pinto, MD [facility] None  03/27/2022  3:00 PM Sherren Mocha, MD [facility]    ------------------------------------------------------------------------------------------------------------------   HPI BP 130/82   Pulse 76   Temp 97.6 F (36.4 C)   Resp  17   Ht '5\' 10"'$  (1.778 m)   Wt 205 lb (93 kg)   SpO2 98%   BMI 29.41 kg/m   71 y.o.male presents for Cough. Negative Covid and Flu test at office today. His voice is raspy, nonproductive cough x 2 days.  Took dexamethasone 4 mg  x 1. Denies sore throat, congestion, fever, nausea, vomiting and diarrhea.  Epidural 2 weeks ago for back pain.  He got thrush from the epidural. White patches and pain on tongue.  BP well controlled with Bystolic 10 mg QD and Micardis 80 mg QD.  Denies chest pain, shortness of breath and dizziness.  BP Readings from Last 3 Encounters:  12/09/21 130/82  10/29/21 118/74  09/30/21 122/76    Past Medical History:  Diagnosis Date   Allergy    CAD (coronary artery disease), native coronary artery    Cath 04/19/2001  normal Left main, moderate proximal disease, 80% stenosis distal LAD, 99% stenosis proximal OM 1, 60% stenosis mid RCA, 70% stenosis ostial mid PDA  PCI with bare metal stent of OM1 same date 3.5 x 18 mm Multilink     Cardiac arrhythmia due to congenital heart disease    Colon polyps    Diverticulosis    GERD (gastroesophageal reflux disease)    Hernia    History of PSVT    Ablation done in 2004 at [facility]    Hyperlipidemia    Hypertension    Sleep apnea  Symptomatic PVCs    Vitamin D deficiency      Allergies  Allergen Reactions   Lisinopril Cough    ACEi cough   Amoxicillin Hives and Swelling   Penicillins Swelling    Current Outpatient Medications on File Prior to Visit  Medication Sig   Cholecalciferol (VITAMIN D) 50 MCG (2000 UT) CAPS Take by mouth.   clopidogrel (PLAVIX) 75 MG tablet Take  1 tablet  Daily  to Prevent Blood Clots   cyclobenzaprine (FLEXERIL) 10 MG tablet Take 1/2 to 1 tablet 3 x /day as needed for Muscle Spasm   ezetimibe (ZETIA) 10 MG tablet Take  1 tablet  Daily  for Cholesterol   gabapentin (NEURONTIN) 600 MG tablet Take  1/2 to 1 tablet  3 x /day  as needed for Pain   nebivolol (BYSTOLIC) 10 MG  tablet Take  1 tablet  Daily  for BP   nitroGLYCERIN (NITROSTAT) 0.4 MG SL tablet Dissolve 1 tablet under tongue every 3 to 5 minutes if needed for Angina   rosuvastatin (CRESTOR) 20 MG tablet Take  1 tablet Daily for  Cholesterol   tadalafil (CIALIS) 20 MG tablet Take  1/2 to 1 tablet   every 2 to 3 days   as needed for  XXXX   telmisartan (MICARDIS) 80 MG tablet Take  1 tablet  Daily  for BP   zolpidem (AMBIEN) 5 MG tablet TAKE ONE TABLET BY MOUTH EVERY EVENING AS NEEDED FOR SLEEP   No current facility-administered medications on file prior to visit.    ROS: all negative except above.   Physical Exam:  BP 130/82   Pulse 76   Temp 97.6 F (36.4 C)   Resp 17   Ht '5\' 10"'$  (1.778 m)   Wt 205 lb (93 kg)   SpO2 98%   BMI 29.41 kg/m   General Appearance: Well nourished, in no apparent distress. Eyes: PERRLA, EOMs, conjunctiva no swelling or erythema Sinuses: No Frontal/maxillary tenderness ENT/Mouth: Ext aud canals clear, TMs without erythema, bulging. No erythema, swelling, or exudate on post pharynx.  Tonsils not swollen or erythematous. Tongue has white streaks Neck: Supple, thyroid normal.  Respiratory: Respiratory effort normal, BS equal bilaterally without rales, rhonchi, wheezing or stridor. Nonproductive coughing that sounds Upper respiratory Cardio: RRR with no MRGs. Brisk peripheral pulses without edema.  Abdomen: Soft, + BS.  Non tender, no guarding, rebound, hernias, masses. Lymphatics: Non tender without lymphadenopathy.  Musculoskeletal: Full ROM, 5/5 strength, normal gait.  Skin: Warm, dry without rashes, lesions, ecchymosis.  Neuro: Cranial nerves intact. Normal muscle tone, no cerebellar symptoms. Sensation intact.  Psych: Awake and oriented X 3, normal affect, Insight and Judgment appropriate.     Alycia Rossetti, NP 2:05 PM [facility]

## 2021-12-09 NOTE — Patient Instructions (Signed)

## 2021-12-11 ENCOUNTER — Encounter: Payer: Self-pay | Admitting: Internal Medicine

## 2021-12-13 DIAGNOSIS — G4733 Obstructive sleep apnea (adult) (pediatric): Secondary | ICD-10-CM | POA: Diagnosis not present

## 2022-01-13 DIAGNOSIS — G4733 Obstructive sleep apnea (adult) (pediatric): Secondary | ICD-10-CM | POA: Diagnosis not present

## 2022-02-13 DIAGNOSIS — G4733 Obstructive sleep apnea (adult) (pediatric): Secondary | ICD-10-CM | POA: Diagnosis not present

## 2022-02-18 ENCOUNTER — Other Ambulatory Visit: Payer: Self-pay | Admitting: Internal Medicine

## 2022-02-18 DIAGNOSIS — I2510 Atherosclerotic heart disease of native coronary artery without angina pectoris: Secondary | ICD-10-CM

## 2022-02-25 ENCOUNTER — Encounter: Payer: Self-pay | Admitting: Neurology

## 2022-02-26 ENCOUNTER — Encounter: Payer: Self-pay | Admitting: Neurology

## 2022-02-26 ENCOUNTER — Ambulatory Visit: Payer: PPO | Admitting: Neurology

## 2022-02-26 VITALS — BP 120/71 | HR 73 | Ht 70.0 in | Wt 207.0 lb

## 2022-02-26 DIAGNOSIS — I493 Ventricular premature depolarization: Secondary | ICD-10-CM

## 2022-02-26 DIAGNOSIS — F5101 Primary insomnia: Secondary | ICD-10-CM

## 2022-02-26 DIAGNOSIS — G4734 Idiopathic sleep related nonobstructive alveolar hypoventilation: Secondary | ICD-10-CM | POA: Diagnosis not present

## 2022-02-26 DIAGNOSIS — G4733 Obstructive sleep apnea (adult) (pediatric): Secondary | ICD-10-CM | POA: Diagnosis not present

## 2022-02-26 MED ORDER — ZOLPIDEM TARTRATE 5 MG PO TABS: ORAL_TABLET | ORAL | 5 refills | Status: DC

## 2022-02-26 NOTE — Progress Notes (Signed)
Order for ONO sent to [facility]

## 2022-02-26 NOTE — Progress Notes (Signed)
SLEEP MEDICINE CLINIC   Provider:  [name], MD  Referring Provider: [name] Pinto, MD Primary Care Physician:  [name] Pinto, MD  Chief Complaint  Patient presents with   Obstructive Sleep Apnea    Rm 2 alone Pt is well and stable, no new concerns. Reports he is not using CPAP but is sleeping well.       REVISIT 2024, 02-26-2022;  Bryan [name],  a 72 year-old  caucasian male patient.  He reports not doing well with CPAP and has given up on therapy with CPAP. He changed to an oral device. His wife feels he is snoring less, less jerking in sleep, easier to fall asleep and stay asleep. More restful.  He is now retired (18 months ago) and he has such a reduction in stress , he feels he sleeps better.  He is doing well for the last 6 months-  he should not have to use CPAP given the improvement in sleep and snoring. He may still have hypoxia though .No morning headaches.  No nocturia.   Epworth score is today : 4/ 24 , FSS at 23/ 63 points, GDS : 5/ 15 -  better since retirement.  No more business travel.      HST 2023:   RV on 12-13-2020, Gideon " [address]Mesic is here for a 6 months visit. His last visit was dedicated to hand pain, not to CPAP compliance. He has retired and has better sleep habits, less STRESSED- and he may have no longer hav a need for Ambien. He still has trazodone at home- I like for him to use it again, and take it 1-2 hours before bedtime ! . I have suspected he is a short sleep sleeper - and has been all his life.  He feels well the next day- and he is only bothered when he tries to hard.  Has some PVCs.  Excellent response to CPAP auto settings between 8 and 18 cm water now- 3 cm EPR, residual AHI 1.5 /h. He is due to a new machine next year and I like for him to undergo a baseline study.  Uses a 95% pressure of 13 cm water. Has no Cheynes -Stokes respirations arising. He will avoid TV in Bed after 11 PM.   Auto set CPAP titration settings  between 8 and 18 cmH2O with 3 cm expiratory pressure relief, heated humidification and interface of the patient's choice and comfort.      RV on 12-13-2020, Elester " [place] "[place] is here for a 6 months visit. His last visit was dedicated to hand pain, not to CPAP compliance. He has retired and has better sleep habits, less STRESSED- and he may have no longer hav a need for Ambien. He still has trazodone at home- I like for him to use it again, and take it 1-2 hours before bedtime ! . I have suspected he is a short sleep sleeper - and has been all his life.  He feels well the next day- and he is only bothered when he tries to hard.  Excellent response to CPAP auto settings between 8 and 18 cm water now- 3 cm EPR, residual AHI 1.5 /h. He is due to a new machine next year and I like for him to undergo a baseline study.  Uses a 95% pressure of 13 cm water. Has no Cheynes -Stokes respirations arising. He will avoid TV in Bed after 11 PM.      RV on  05-24-2020: I have the pleasure of meeting Quamere Lemas today in a revisit who is just a couple of days short of his 70th birthday.  He has used the CPAP compliantly is a 77% compliance 23 out of 30 days his average use at time of 4 hours and 59 minutes on days used.  However they have been 3 days where he did not use the machine.  He has using an AutoSet between 8 and 18 cm water pressure with 3 cm expiratory pressure relief and achieved an AHI residual of only 2.3/h.  I considered that his apnea is very well controlled.  The pressure at the 95th percentile is 14.3 cmH2O and he does have moderate air leakage.  He has just changed from a larger nasal pillow to a medium size and finds this fit him best.  He remains a patient with chronic insomnia has in the past had chronic pain, hypertension, coronary artery disease and we have tried and failed Klonopin, Lunesta, doxepin, trazodone he has tried [employer] and has not had the desired effect from it even after he doubled  the dose.  He has never seen a cognitive behavioral therapist for insomnia and this would be my strong recommendation however he also stated that his best result was ever achieved on a CR release Ambien So and I wonder if he is just not able to respond to any other medication or intervention.   We had used 6.8 mg Ambien CR in the year 2020 and we had prescribed 5 mg of Sonata in 2021, both with limited success.  He has failed a dental device that Dr. Ron Parker has made in the past.  He continued to have felt a high degree of fatigue but he was not as excessively sleepy.  He does have a high risk higher sleep efficiency when he uses CPAP;  And he dreams about 3 or 4 times a week on average.   11-17-2019: I have the pleasure of seeing Mr. Levitin again he had undergone a polysomnography test and had previously had a home sleep test which confirmed an AHI of 30.9.  His titration to positive airway pressure took place on 18 May of this year under a Respironics DreamWear mask this was a full facemask which spares the nose and was used in size large.  The patient did well actually as long as he could avoid sleeping on his back.  Sleep-related hypoxemia improved on the final pressure of titration and central apneas were actually reduced on the CPAP there were still some PLM's but much less than in the there is at the beginning of the study.  The total oxygen desaturation time equaled only 8 minutes.  I ordered an auto CPAP setting between 8 and 18 cmH2O best to centimeter EPR and try to persuade the patient to sleep on his back.  He has noticed at home that he has much less restless legs now but he has not been able to use his CPAP in the lab.date Response him to have been to his CPAP at 16 cmH2O, and therefore his AutoSet was meant to encompass the pressure when he uses the machine his AHI is under 3/h with some residual obstructive apneas but he has not been able to sleep with it on it is not particularly that the mask or  machine bothers him it is his underlying insomnia but has not improved and makes it so hard for him to sleep with or without machine.  He now  uses an adjustable bed at home and that to him feels much better so what I like for him to try as we are changing his sleep aid #1 again and will exchange the 6.8 mg Ambien CR to  an immediate release-  I will offer Sonata - 5 mg.   PS: Wife reports a lot of microsleep attacks.   RV on 05-03-2019 CD. Mr Donati is returning after PSG test and reports ongoing dependency on Ambien- cannot sleep with trazodone. He had undergone a home sleep test on April 14 and today the home sleep test confirmed an AHI of 30.9 which is considered severe apnea, much higher REM sleep dependent AHI of 52.3 and a complete 9-hour sleep time.  Very concerning however 95.8 minutes out of his total sleep time there in oxygen desaturation.  Positional data did not register.  Based on these home sleep test data I think that a dental device is not an appropriate treatment.  There is such as strict dependency on REM sleep with oxygen desaturation and variable heart rates but only a CPAP device is actually an appropriate treatment.  Even the inspire device is not as likely to help him overcome REM dependent sleep apnea.  His continues to endorse the Epworth Sleepiness Scale at 16 out of 24 points, there was no fatigue endorsed.  He continues to have trouble sleeping at night and reported that he slept rather well when using Ambien.  I would consider him by now dependent on sleep aids and I will continue to provide the medication that he needs to get sleep also we will probably need to alternate 4 days of Ambien with 3 days of trazodone or something else to help him slowly wean off. Offered belsomra. He uses melatonin. He has tried oils, soft meditative music, all helps to relax, none helps to sleep through.   RV on 03-31-2019, Mr. Tsou, 72 years of age, and a patient with OSA and failed PAP therapy-  he has already been vaccinated for Covid, both shots, Moderna. Dr Ron Parker made a dental device that has only been partially effective as there is REM dependent apnea. We meet to re cap and look for alternatives. Mr. Sandria Bales who prefers to go by brain has had trouble with his front teeth especially his left canine, and he relates the breakage of the tooth to the difficulties and resistance of the dental device he wore.  So he is reluctant to get back on it but also because it can cause jaw pain as well as not treating his apnea effectively.  Dr. Ron Parker' had shown that there was still a high residual AHI. Dr. Ron Parker allowed me to review his diagnostic report this was a ResMed device, his so home sleep test was done by apnea link.  Supine AHI 30.8 of which apneas were 17.9, he slept 1 hour 10 minutes in supine so this was a big part of the sleep study.  Nonsupine sleep AHI 1.92 apneas 0.3/h.  Total events all obstructive no Cheyne-Stokes respiration no prolonged oxygen desaturation heart rate variability was great between 45 bpm and 148 bpm but this is notoriously artifact prone in any home sleep test.  Mr. Rock Nephew also endorsed the fatigue severity scale today at 53 out of 63 points which is high, his sleep has not been refreshing or restorative and is highly fragmented.  He endorsed the Epworth Sleepiness Scale at 8 points out of 24.  I would like to recap his sleep study from  10 January 2016 at the time he only slept 68% of the recorded time in the lab his AHI was 21.6 and he did not reach REM sleep.  The problem is that we could not evaluate REM sleep dependent apnea in a patient who never entered REM sleep in the first place.  He returned for full night titration on 30 January the same year his sleep efficiency the first night was 91.6% his AHI reduced to 0.9 this time REM sleep was entered and during REM sleep he still has a 3.8 apneas per hour  He often lost the CPAP interface in the night- the snoring was gone while  he used the CPAP and the dental device.  Still had REM dependent apneas- he has heard of the inspire device but wasmn't aware of the implant nature- it's a hypoglossal CN stimulator. It still opens the airway but does not assist the air flow to the ribcage.    He reports insomnia. He has tried [place] -its giving him a metal taste and stopped working. Has had ambien but not ER form .never tried trazodone.      HPI: I have the pleasure of seeing Mr. Ditomaso today on 10/27/2016, he reports he has struggled with CPAP use in spite of trying Klonopin, Lunesta and even Ambien to help him overcome the insomnia that he developed. His first week compliant with CPAP was rather easy but something changed and he would lay awake for an hour before initiating sleep. The CPAP setting of 11 cm water with 3 cm EPR his residual AHI is excellent at only 2.6 apneas per hour but he only used the machine for 4 out of the last 30 days because of the bothersome problem of sleep initiation. He feels tired and fatigued during the day but he has also name several stressors that have culminated over the last 6 month and may be the cause of his insomnia. I would like for him to try an SSRi, but he just started on Zoloft. He wakes with a puffy uvula and parched mouth.  He may be a good candidate for a dental device/     CD :  UNDRA BLAZIER is a 72 y.o. male , seen here as a referral from Dr. Melford Aase for snoring, witnessed apnea and needs an evaluation. Last week he was in [place] , [place], and didn't snore. His wife was amazed. He was skiing every day and slept well. Over the last couple of years his snoring has become louder, to a level where his wife often has left the marital bedroom. His past medical history and medical history is positive for coronary artery disease in the native coronary artery, an 80% stenosis of the distal LAD, 99% stenosis proximal and. A stent was placed. He also has diverticulosis, gastroesophageal reflux  disease, hernia, he had supraventricular tachycardia and was treated with an ablation in 2004, hyperlipidemia, hypertension, symptomatic palpitations, PVCs, vitamin D deficiency. I reviewed his medication; he has silenor/ doxepin  available at bedtime as a sleep aid, takes Zoloft, Crestor, Zofran in case of nausea, lisinopril, gabapentin 3 times a day Zetia by systolic, Plavix, Flexeril 10 mg for muscle spasms as needed. He has noted a very dry mouth and his dentist noted a dry mouth as well, likely related to doxepin. lunesta failed- metal taste. Best on klonopin. Sleep habits are as follows: The patient is usually in bed by 10 PM but struggles to fall asleep without medication. He is taking doxepin.  He sometimes has myoclonic tics at night. He averages 4-6 hours of nocturnal sleep, the bedroom is cool, quiet and dark. He prefers to sleep on his side. 2 pillows for head support usually. He will have one nocturia, during the night , sometimes has trouble to go back to sleep. The bathroom break is usually between 2 and 3 AM . He has also 3-4 times a week dreams that he can recall, and sometimes tries to enact. These wake him up- arm and leg movements.  His dreams are not nightmarish in character and he usually does not feel that he has to defend himself or is under threat  He wakes up with a very dry mouth. No headaches, rarely palpitations. Sleep medical history and family sleep history:  No tonsillectomy, TBI, and no sinus ENT surgery.    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 [employer].  Just retired September 2021-  Never smoked, caffeine , 2 cups of coffee in AM, sometimes Soda, rare tea.  Drinks 2-4 glasses of wine weekly- .   Review of Systems: Out of a complete 14 system review, the patient complains of only the following symptoms, and all other reviewed systems are negative.  tinnitus,  Diplopia.  Non compliant with CPAP- changed  to dental device.  Epworth score 7 / 24 on CPAP ,  Fatigue severity score N/A- ,  depression score 2/15 last visit .  Has been on Ambien, Sinequan, Lunesta.    How likely are you to doze in the following situations: 0 = not likely, 1 = slight chance, 2 = moderate chance, 3 = high chance  Sitting and Reading? Watching Television? Sitting inactive in a public place (theater or meeting)? Lying down in the afternoon when circumstances permit? Sitting and talking to someone? Sitting quietly after lunch without alcohol? In a car, while stopped for a few minutes in traffic? As a passenger in a car for an hour without a break?  Total =  6 points on 12-13-2020!  From 14/ 24  points last year   FSS at 31/ 71.  high energy, attention deficit hyperactivity.      Social History   Socioeconomic History   Marital status: Married    Spouse name: Not on file   Number of children: 3   Years of education: Not on file   Highest education level: Not on file  Occupational History   Occupation: retired  Tobacco Use   Smoking status: Never   Smokeless tobacco: Never  Vaping Use   Vaping Use: Never used  Substance and Sexual Activity   Alcohol use: Yes    Comment: rarely   Drug use: No   Sexual activity: Not on file  Other Topics Concern   Not on file  Social History Narrative   Drinks 2 cups of coffee a day    Social Determinants of Health   Financial Resource Strain: Not on file  Food Insecurity: No Food Insecurity (01/31/2021)   Hunger Vital Sign    Worried About Running Out of Food in the Last Year: Never true    Ran Out of Food in the Last Year: Never true  Transportation Needs: No Transportation Needs (01/31/2021)   PRAPARE - Hydrologist (Medical): No    Lack of Transportation (Non-Medical): No  Physical Activity: Not on file  Stress: Not on file  Social Connections: Not on file  Intimate Partner Violence: Not on  file    Family History   Problem Relation Age of Onset   Hypothyroidism Mother    Irritable bowel syndrome Mother    Colonic polyp Mother        PRE-malignant polyp of rectosigmoid colon   Colon polyps Mother    Brain cancer Father    Celiac disease Brother    Heart disease Maternal Grandfather    Esophageal cancer Neg Hx    Stomach cancer Neg Hx    Pancreatic cancer Neg Hx    Liver disease Neg Hx    Colon cancer Neg Hx    Rectal cancer Neg Hx     Past Medical History:  Diagnosis Date   Allergy    CAD (coronary artery disease), native coronary artery    Cath 04/19/2001  normal Left main, moderate proximal disease, 80% stenosis distal LAD, 99% stenosis proximal OM 1, 60% stenosis mid RCA, 70% stenosis ostial mid PDA  PCI with bare metal stent of OM1 same date 3.5 x 18 mm Multilink     Cardiac arrhythmia due to congenital heart disease    Colon polyps    Diverticulosis    GERD (gastroesophageal reflux disease)    Hernia    History of PSVT    Ablation done in 2004 at [facility]    Hyperlipidemia    Hypertension    Sleep apnea    Symptomatic PVCs    Vitamin D deficiency     Past Surgical History:  Procedure Laterality Date   ACHILLES TENDON REPAIR     left   APPENDECTOMY     CARDIAC ELECTROPHYSIOLOGY MAPPING AND ABLATION     COLONOSCOPY     CORONARY ANGIOPLASTY WITH STENT PLACEMENT     years ago    Current Outpatient Medications  Medication Sig Dispense Refill   clopidogrel (PLAVIX) 75 MG tablet TAKE ONE TABLET BY MOUTH DAILY TO PREVENT BLOOD CLOTS 90 tablet 3   ezetimibe (ZETIA) 10 MG tablet Take  1 tablet  Daily  for Cholesterol 90 tablet 3   nebivolol (BYSTOLIC) 10 MG tablet Take  1 tablet  Daily  for BP 90 tablet 3   nitroGLYCERIN (NITROSTAT) 0.4 MG SL tablet Dissolve 1 tablet under tongue every 3 to 5 minutes if needed for Angina 50 tablet 11   rosuvastatin (CRESTOR) 20 MG tablet Take  1 tablet Daily for  Cholesterol 90 tablet 3   tadalafil (CIALIS) 20 MG tablet Take  1/2 to 1  tablet   every 2 to 3 days   as needed for  XXXX 30 tablet 1   telmisartan (MICARDIS) 80 MG tablet Take  1 tablet  Daily  for BP 90 tablet 3   zolpidem (AMBIEN) 5 MG tablet TAKE ONE TABLET BY MOUTH EVERY EVENING AS NEEDED FOR SLEEP 30 tablet 3   No current facility-administered medications for this visit.    Allergies as of 02/26/2022 - Review Complete 02/26/2022  Allergen Reaction Noted   Lisinopril Cough 06/05/2016   Amoxicillin Hives and Swelling 11/16/2012   Penicillins Swelling 12/10/2011    Vitals: BP 120/71   Pulse 73   Ht 5' 10"$  (1.778 m)   Wt 207 lb (93.9 kg)   BMI 29.70 kg/m  Last Weight:  Wt Readings from Last 1 Encounters:  02/26/22 207 lb (93.9 kg)   [id number] mass index is 29.7 kg/m.     Last Height:   Ht Readings from Last 1 Encounters:  02/26/22 5' 10"$  (1.778 m)  Physical exam:  General: The patient is awake, alert and appears not in acute distress. The patient is well groomed. Head: Normocephalic, atraumatic. Neck is supple. Mallampati 3.  neck circumference: 17. Nasal airflow congested and present with a dry mouth. All natural teeth.  Cardiovascular:  Regular rate and rhythm. Respiratory: Lungs are clear to auscultation. Skin:  Facial puffiness,  Trunk: BMI is now 29, down 18 pounds in retirement.-, has a less arthralgic gait.  Neurologic exam :The patient is awake and alert, oriented to place and time.    Cranial nerves: no loss of smell or taste  Pupils are equal and briskly reactive to light. Extraocular movements  in vertical and horizontal planes  without nystagmus.  Visual fields by finger perimetry are intact. Hearing to finger rub intact. Facial sensation intact to fine touch.  Facial motor strength is symmetric and tongue and uvula move midline.  Shoulder shrug was symmetrical.  Motor exam:  No restless legs, no Spasticity.  Sensory:  Numbness in right hand, not carpaltunnel, but cervical radiculopathy- and chronically present now- has seen  Dr. Ellene Route. .   Deep tendon reflexes: in the upper and lower extremities are symmetric and intact.      Assessment:  After physical and neurologic examination, review of laboratory studies,  Personal review of imaging studies, reports of other /same  Imaging studies,   Results of polysomnography/ neurophysiology testing and pre-existing records as far as provided in visit., my 25 minute assessment is :  ABDULHAMID UPPAL,  a 73 year-old  caucasian male patient.  He reports not doing well with CPAP and has given up on therapy with CPAP. He changed to an oral device. His wife feels he is snoring less, less jerking in sleep, easier to fall asleep and stay asleep. More restful.  He is now retired (18 months ago) and he has such a reduction in stress , he feels he sleeps better.  He is doing well for the last 6 months-  he should not have to use CPAP given the improvement in sleep and snoring. He may still have hypoxia though .No morning headaches.  No nocturia.   Epworth score is today : 4/ 24 , FSS at 23/ 63 points, GDS : 5/ 15 -  better since retirement.  No more business travel.     1) he is doing well on an OTC  dental device, he feels relaxed, his sleep is improved. Alert, not fatigued.   2) he is not wanting to return to CPAP . Snoring is better. May still have hypoxia.   3) He has major oral surgery soon, I would wait to have any device  specific  made,   device - until after the surgery and healing process is complete.    Plan:  Treatment plan and additional workup :  ONO on dental device.  Rv in 6 months. Refilled ambien .     [name], M.D.   CC: [name] Pinto, [address] [place],  [place] [id number]

## 2022-03-04 ENCOUNTER — Encounter: Payer: Self-pay | Admitting: Internal Medicine

## 2022-03-04 ENCOUNTER — Ambulatory Visit (INDEPENDENT_AMBULATORY_CARE_PROVIDER_SITE_OTHER): Payer: PPO | Admitting: Internal Medicine

## 2022-03-04 VITALS — BP 112/72 | HR 70 | Temp 97.7°F | Resp 16 | Ht 70.0 in | Wt 210.4 lb

## 2022-03-04 DIAGNOSIS — E559 Vitamin D deficiency, unspecified: Secondary | ICD-10-CM | POA: Diagnosis not present

## 2022-03-04 DIAGNOSIS — H00012 Hordeolum externum right lower eyelid: Secondary | ICD-10-CM

## 2022-03-04 DIAGNOSIS — Z125 Encounter for screening for malignant neoplasm of prostate: Secondary | ICD-10-CM

## 2022-03-04 DIAGNOSIS — R6889 Other general symptoms and signs: Secondary | ICD-10-CM

## 2022-03-04 DIAGNOSIS — N138 Other obstructive and reflux uropathy: Secondary | ICD-10-CM

## 2022-03-04 DIAGNOSIS — Z79899 Other long term (current) drug therapy: Secondary | ICD-10-CM

## 2022-03-04 DIAGNOSIS — I10 Essential (primary) hypertension: Secondary | ICD-10-CM | POA: Diagnosis not present

## 2022-03-04 DIAGNOSIS — N401 Enlarged prostate with lower urinary tract symptoms: Secondary | ICD-10-CM

## 2022-03-04 DIAGNOSIS — I2510 Atherosclerotic heart disease of native coronary artery without angina pectoris: Secondary | ICD-10-CM

## 2022-03-04 DIAGNOSIS — Z0001 Encounter for general adult medical examination with abnormal findings: Secondary | ICD-10-CM

## 2022-03-04 DIAGNOSIS — Z1211 Encounter for screening for malignant neoplasm of colon: Secondary | ICD-10-CM

## 2022-03-04 DIAGNOSIS — I700 Atherosclerosis of aorta: Secondary | ICD-10-CM | POA: Diagnosis not present

## 2022-03-04 DIAGNOSIS — I708 Atherosclerosis of other arteries: Secondary | ICD-10-CM

## 2022-03-04 DIAGNOSIS — Z8249 Family history of ischemic heart disease and other diseases of the circulatory system: Secondary | ICD-10-CM

## 2022-03-04 DIAGNOSIS — R7309 Other abnormal glucose: Secondary | ICD-10-CM | POA: Diagnosis not present

## 2022-03-04 DIAGNOSIS — E782 Mixed hyperlipidemia: Secondary | ICD-10-CM

## 2022-03-04 DIAGNOSIS — L820 Inflamed seborrheic keratosis: Secondary | ICD-10-CM | POA: Diagnosis not present

## 2022-03-04 DIAGNOSIS — Z136 Encounter for screening for cardiovascular disorders: Secondary | ICD-10-CM

## 2022-03-04 MED ORDER — DOXYCYCLINE HYCLATE 100 MG PO CAPS: ORAL_CAPSULE | ORAL | 1 refills | Status: DC

## 2022-03-04 MED ORDER — NEOMYCIN-POLYMYXIN-DEXAMETH 3.5-10000-0.1 OP OINT: TOPICAL_OINTMENT | OPHTHALMIC | 2 refills | Status: DC

## 2022-03-04 NOTE — Progress Notes (Signed)
Annual  Screening/Preventative Visit  & Comprehensive Evaluation & Examination  Future Appointments  Date Time Provider Department  03/27/2022  3:00 PM Sherren Mocha, MD [facility]  02/26/2023  3:30 PM Dohmeier, Asencion Partridge, MD [facility]  03/11/2023  2:00 PM [name] Pinto, MD [facility]            This very nice 72 y.o.  [name] presents for a Screening /Preventative Visit & comprehensive evaluation and management of multiple medical co-morbidities.  Patient has been followed for HTN, ASCAD/Stents, HLD, Prediabetes and Vitamin D Deficiency. Patient is  followed by Dr Brett Fairy with severe OSA intolerant to CPAP & uses an oral device.  Patient also also uses Ambien/ Zolpidem on a regular basis. Abd CT scan in 2018 showed Aorto-Iliac  Atherosclerosis. Patient has been followed by Dr Ellene Route for Cx EDSI's for Cx DDD.        HTN predates since 54. Patient's BP has been controlled at home.  Patient had PCA/Stent rescue of ACS/NSTEMI in 2003 (age 83 yo) and in 2004 & kin 2010, he had Ablations for pAfib. Today's BP is at goal -  112/72. Patient denies any cardiac symptoms as chest pain, palpitations, shortness of breath, dizziness or ankle swelling.        Patient's hyperlipidemia is controlled with diet and Rosuvastatin.  Patient denies myalgias or other medication SE's. Last lipids were at goal except elevated Trig's :  Lab Results  Component Value Date   CHOL 148 09/30/2021   HDL 48 09/30/2021   LDLCALC 72 09/30/2021   LDLDIRECT 59 11/07/2019   TRIG 190 (H) 09/30/2021   CHOLHDL 3.1 09/30/2021         Patient has hx/o  prediabetes (A1c 5.8% /2019) and patient denies reactive hypoglycemic symptoms, visual blurring, diabetic polys or paresthesias. Last A1c was at goal :   Lab Results  Component Value Date   HGBA1C 5.4 09/30/2021         Finally, patient has history of Vitamin D Deficiency ("26" /2008) and last vitamin D was at goal :   Lab Results  Component Value Date   VD25OH  58 02/26/2021       Current Outpatient Medications on File Prior to Visit  Medication Sig   buPROPion XL 150 MG  Take  1 tablet  Daily     VITAMIN D  5,000 u Take daily.   clopidogrel  75 MG tablet Take  1 tablet  Daily     ezetimibe 10 MG tablet Take  1 tablet  Daily     gabapentin 600 MG tablet Take  1/2 to 1 tablet  3 x /day  as needed for Pain     nebivolol  10 MG tablet TAKE ONE TABLET DAILY    NITROSTAT 0.4 MG SL tablet if needed for Angina    rosuvastatin 20 MG tablet Take 1 tablet daily.   sildenafil  100 MG tablet Take 1/2 to 1 tablet Daily as needed    telmisartan  80 MG tablet Take 1 tablet daily.   zolpidem (AMBIEN) 5 MG tablet Takes 1 tablet at Bedtime per Dr Brett Fairy    Allergies  Allergen Reactions   Lisinopril Cough    ACEi cough   Amoxicillin Hives and Swelling   Penicillins Swelling     Past Medical History:  Diagnosis Date   Allergy    CAD (coronary artery disease), native coronary artery    Cath 04/19/2001  normal Left main, moderate proximal disease, 80% stenosis  distal LAD, 99% stenosis proximal OM 1, 60% stenosis mid RCA, 70% stenosis ostial mid PDA  PCI with bare metal stent of OM1 same date 3.5 x 18 mm Multilink     Diverticulosis    GERD (gastroesophageal reflux disease)    Hernia    History of PSVT    Abl[facility] done in 2004 at [facility]    Hyperlipidemia    Hypertension    Sleep apnea    Symptomatic PVCs    Vitamin D deficiency      Health Maintenance  Topic Date Due   COVID-19 Vaccine (1) Never done   Zoster Vaccines- Shingrix (1 of 2) Never done   TETANUS/TDAP  03/07/2026   Pneumonia Vaccine 65+  Completed   INFLUENZA VACCINE  Completed   Hepatitis C Screening  Completed   HPV VACCINES  Aged Out     Immunization History  Administered Date(s) Administered   Influenza Split 10/13/2013, 10/23/2014   Influenza, High Dose  01/09/2017, 10/27/2017, 11/21/2019, 09/27/2020   Influenza,inj,quad 01/13/2013   Influenza 10/07/2011    PPD Test 10/13/2013, 10/23/2014   Pneumococcal -13 10/13/2013   Pneumococcal -23 10/23/2014, 02/21/2020   Td 01/06/2006   Tdap 03/06/2016   Zoster, Live 09/22/2011    Last Colon - 09/05/2021  - Dr Hilarie Fredrickson - Recc 5 yr f/u Colon due Sept 2028                            Past Surgical History:  Procedure Laterality Date   ACHILLES TENDON REPAIR     left   APPENDECTOMY     CARDIAC ELECTROPHYSIOLOGY MAPPING AND ABLATION     COLONOSCOPY     CORONARY ANGIOPLASTY WITH STENT PLACEMENT       Social History   Socioeconomic History   Marital status: Married      Spouse name: Katrina   Number of children: 3  Occupational History   Retired   Tobacco Use   Smoking status: Never   Smokeless tobacco: Never  Vaping Use   Vaping Use: Never used  Substance Use Topics   Alcohol use: Yes    Comment: rarely   Drug use: No      ROS Constitutional: Denies fever, chills, weight loss/gain, headaches, insomnia,  night sweats or change in appetite. Does c/o fatigue. Eyes: Denies redness, blurred vision, diplopia, discharge, itchy or watery eyes.  ENT: Denies discharge, congestion, post nasal drip, epistaxis, sore throat, earache, hearing loss, dental pain, Tinnitus, Vertigo, Sinus pain or snoring.  Cardio: Denies chest pain, palpitations, irregular heartbeat, syncope, dyspnea, diaphoresis, orthopnea, PND, claudication or edema Respiratory: denies cough, dyspnea, DOE, pleurisy, hoarseness, laryngitis or wheezing.  Gastrointestinal: Denies dysphagia, heartburn, reflux, water brash, pain, cramps, nausea, vomiting, bloating, diarrhea, constipation, hematemesis, melena, hematochezia, jaundice or hemorrhoids Genitourinary: Denies dysuria, frequency, urgency, nocturia, hesitancy, discharge, hematuria or flank pain Musculoskeletal: Denies arthralgia, myalgia, stiffness, Jt. Swelling, pain, limp or strain/sprain. Denies Falls. Skin: Denies puritis, rash, hives, warts, acne, or  eczema. Does have  multiple pruritic scaly crusty skin lesioins Neuro: No weakness, tremor, incoordination, spasms, paresthesia or pain Psychiatric: Denies confusion, memory loss or sensory loss. Denies Depression. Endocrine: Denies change in weight, skin, hair change, nocturia, and paresthesia, diabetic polys, visual blurring or hyper / hypo glycemic episodes.  Heme/Lymph: No excessive bleeding, bruising or enlarged lymph nodes.   Physical Exam  BP 112/72   Pulse 70   Temp 97.7 F (36.5 C)   Resp  16   Ht '5\' 10"'$  (1.778 m)   Wt 210 lb 6.4 oz (95.4 kg)   SpO2 98%   BMI 30.19 kg/m   General Appearance: Well nourished and well groomed and in no apparent distress.  Eyes: PERRLA, EOMs, conjunctiva no swelling or erythema, normal fundi and vessels. Sinuses: No frontal/maxillary tenderness ENT/Mouth: EACs patent / TMs  nl. Nares clear without erythema, swelling, mucoid exudates. Oral hygiene is good. No erythema, swelling, or exudate. Mallampati 2.    Tonsils not swollen or erythematous. Hearing normal.  Neck: Supple, thyroid not palpable. No bruits, nodes or JVD. Respiratory: Respiratory effort normal.  BS equal and clear bilateral without rales, rhonci, wheezing or stridor. Cardio: Heart sounds are normal with regular rate and rhythm and no murmurs, rubs or gallops. Peripheral pulses are normal and equal bilaterally without edema. No aortic or femoral bruits. Chest: symmetric with normal excursions and percussion.  Abdomen: Soft, with Nl bowel sounds. Nontender, no guarding, rebound, hernias, masses, or organomegaly.  Lymphatics: Non tender without lymphadenopathy.  Musculoskeletal: Full ROM all peripheral extremities, joint stability, 5/5 strength, and normal gait. Skin: Warm and dry without rashes, cyanosis, clubbing or  ecchymosis.  Neuro: Cranial nerves intact, reflexes equal bilaterally. Normal muscle tone, no cerebellar symptoms. Sensation intact.  Pysch: Alert and oriented X 3 with normal affect,  insight and judgment appropriate.   Procedure(s)  ( CPT: 17000, 17003 & 17004 )     After informed consent & prep,  # 2 thick raised crusty irritated seborrheic keratoses approx 5 x 5 mm in the Left  inguinal line  were treated with cryosurgery by liquid nitrogen. Then 40 more similar lesions   over the total back and a few of the bilateral neck & upper chest were likewise treated with liquid nitrogen by the triple freeze thaw technique. Patient was instructed in post - op wound care & recommended  follow-up.  Assessment and Plan  1. Annual Preventative/Screening Exam    2. Essential hypertension  - EKG 12-Lead - [place], RETROPERITNL ABD,  LTD - Urinalysis, Routine w reflex microscopic - Microalbumin / creatinine urine ratio - CBC with Differential/Platelet - COMPLETE METABOLIC PANEL WITH GFR - Magnesium - TSH  3. Hyperlipidemia, mixed  - EKG 12-Lead - [place], RETROPERITNL ABD,  LTD - Lipid panel - TSH  4. Abnormal glucose  - EKG 12-Lead - [place], RETROPERITNL ABD,  LTD - Hemoglobin A1c - Insulin, random  5. Vitamin D deficiency  - VITAMIN D 25 Hydroxy   6. Coronary artery disease involving native coronary  artery of native heart without angina pectoris  - EKG 12-Lead - Lipid panel  7. OSA on CPAP   8. Aorto-iliac atherosclerosis ([place]) by Pelvic CT scan 03/2016  - EKG 12-Lead - [place], RETROPERITNL ABD,  LTD - Lipid panel  9. Obesity (BMI 30.0-34.9)   10. BPH with obstruction/lower urinary tract symptoms  - PSA  11. Prostate cancer screening  - PSA  12. Screening for ischemic heart disease  - EKG 12-Lead  13. FH: hypertension  - EKG 12-Lead - [place], RETROPERITNL ABD,  LTD  14. Screening for AAA (aortic abdominal aneurysm)  - [place], RETROPERITNL ABD,  LTD  15. Medication management  - Urinalysis, Routine w reflex microscopic - Microalbumin / creatinine urine ratio - CBC with Differential/Platelet - COMPLETE METABOLIC PANEL WITH GFR - Magnesium - Lipid  panel - TSH - Hemoglobin A1c - Insulin, random - VITAMIN D 25 Hydroxy  Patient was counseled in prudent diet, weight control to achieve/maintain BMI less than 25, BP monitoring, regular exercise and medications as discussed.  Discussed med effects and SE's. Routine screening labs and tests as requested with regular follow-up as recommended. Over 40 minutes of exam, counseling, chart review and high complex critical decision making was performed   Kirtland Bouchard, MD

## 2022-03-04 NOTE — Patient Instructions (Signed)

## 2022-03-05 LAB — MAGNESIUM: Magnesium: 2.2 mg/dL (ref 1.5–2.5)

## 2022-03-05 LAB — CBC WITH DIFFERENTIAL/PLATELET
Absolute Monocytes: 577 cells/uL (ref 200–950)
Basophils Absolute: 50 cells/uL (ref 0–200)
Basophils Relative: 0.8 %
Eosinophils Absolute: 260 cells/uL (ref 15–500)
Eosinophils Relative: 4.2 %
HCT: 46.4 % (ref 38.5–50.0)
Hemoglobin: 16.2 g/dL (ref 13.2–17.1)
Lymphs Abs: 1916 cells/uL (ref 850–3900)
MCH: 30.7 pg (ref 27.0–33.0)
MCHC: 34.9 g/dL (ref 32.0–36.0)
MCV: 87.9 fL (ref 80.0–100.0)
MPV: 10.6 fL (ref 7.5–12.5)
Monocytes Relative: 9.3 %
Neutro Abs: 3398 cells/uL (ref 1500–7800)
Neutrophils Relative %: 54.8 %
Platelets: 231 10*3/uL (ref 140–400)
RBC: 5.28 10*6/uL (ref 4.20–5.80)
RDW: 12.1 % (ref 11.0–15.0)
Total Lymphocyte: 30.9 %
WBC: 6.2 10*3/uL (ref 3.8–10.8)

## 2022-03-05 LAB — MICROALBUMIN / CREATININE URINE RATIO
Creatinine, Urine: 150 mg/dL (ref 20–320)
Microalb Creat Ratio: 4 mcg/mg creat (ref <30)
Microalb, Ur: 0.6 mg/dL

## 2022-03-05 LAB — URINALYSIS, ROUTINE W REFLEX MICROSCOPIC
Bilirubin Urine: NEGATIVE
Glucose, UA: NEGATIVE
Hgb urine dipstick: NEGATIVE
Ketones, ur: NEGATIVE
Leukocytes,Ua: NEGATIVE
Nitrite: NEGATIVE
Protein, ur: NEGATIVE
Specific Gravity, Urine: 1.02 (ref 1.001–1.035)
pH: 6 (ref 5.0–8.0)

## 2022-03-05 LAB — COMPLETE METABOLIC PANEL WITH GFR
AG Ratio: 2.1 (calc) (ref 1.0–2.5)
ALT: 25 U/L (ref 9–46)
AST: 20 U/L (ref 10–35)
Albumin: 4.6 g/dL (ref 3.6–5.1)
Alkaline phosphatase (APISO): 57 U/L (ref 35–144)
BUN: 14 mg/dL (ref 7–25)
CO2: 26 mmol/L (ref 20–32)
Calcium: 9.3 mg/dL (ref 8.6–10.3)
Chloride: 105 mmol/L (ref 98–110)
Creat: 0.92 mg/dL (ref 0.70–1.28)
Globulin: 2.2 g/dL (calc) (ref 1.9–3.7)
Glucose, Bld: 85 mg/dL (ref 65–99)
Potassium: 4.2 mmol/L (ref 3.5–5.3)
Sodium: 140 mmol/L (ref 135–146)
Total Bilirubin: 0.9 mg/dL (ref 0.2–1.2)
Total Protein: 6.8 g/dL (ref 6.1–8.1)
eGFR: 89 mL/min/{1.73_m2} (ref >=60)

## 2022-03-05 LAB — INSULIN, RANDOM: Insulin: 18 u[IU]/mL

## 2022-03-05 LAB — LIPID PANEL
Cholesterol: 133 mg/dL
HDL: 50 mg/dL
LDL Cholesterol (Calc): 54 mg/dL
Non-HDL Cholesterol (Calc): 83 mg/dL
Total CHOL/HDL Ratio: 2.7 (calc)
Triglycerides: 219 mg/dL — ABNORMAL HIGH

## 2022-03-05 LAB — HEMOGLOBIN A1C
Hgb A1c MFr Bld: 5.4 % of total Hgb (ref <5.7)
Mean Plasma Glucose: 108 mg/dL
eAG (mmol/L): 6 mmol/L

## 2022-03-05 LAB — TSH: TSH: 1.85 mIU/L (ref 0.40–4.50)

## 2022-03-05 LAB — PSA: PSA: 0.71 ng/mL (ref <=4.00)

## 2022-03-05 NOTE — Progress Notes (Signed)
<><><><><><><><><><><><><><><><><><><><><><><><><><><><><><><><><> <><><><><><><><><><><><><><><><><><><><><><><><><><><><><><><><><> -   Test results slightly outside the reference range are not unusual. If there is anything important, I will review this with you,  otherwise it is considered normal test values.  If you have further questions,  please do not hesitate to contact me at the office or via My Chart.  <><><><><><><><><><><><><><><><><><><><><><><><><><><><><><><><><> <><><><><><><><><><><><><><><><><><><><><><><><><><><><><><><><><>  -  Lipids remain Wonderful   !  - Chol = 133 & LDL = 54  - Both    Excellent   - Very low risk for Heart Attack  / Stroke <><><><><><><><><><><><><><><><><><><><><><><><><><><><><><><><><>  -  but Triglycerides ( 219  ) or fats in blood are too high                 (   Ideal or  Goal is less than 150  !  )    - Recommend avoid fried & greasy foods,  sweets / candy,   - Avoid white rice  (brown or wild rice or Quinoa is OK),   - Avoid white potatoes  (sweet potatoes are OK)   - Avoid anything made from white flour  - bagels, doughnuts, rolls, buns, biscuits, white and   wheat breads, pizza crust and traditional  pasta made of white flour & egg white  - (vegetarian pasta or spinach or wheat pasta is OK).    - Multi-grain bread is OK - like multi-grain flat bread or  sandwich thins.   - Avoid alcohol in excess.   - Exercise is also important. <><><><><><><><><><><><><><><><><><><><><><><><><><><><><><><><><>  -  PSA  - very Low - No Prostate Cancer  - Great   ! <><><><><><><><><><><><><><><><><><><><><><><><><><><><><><><><><>  -  A1c - Normal - No Diabetes  -  Great  ! <><><><><><><><><><><><><><><><><><><><><><><><><><><><><><><><><>  -  All Else - CBC - Kidneys - Electrolytes - Liver - Magnesium & Thyroid    - all  Normal /  OK <><><><><><><><><><><><><><><><><><><><><><><><><><><><><><><><><> <><><><><><><><><><><><><><><><><><><><><><><><><><><><><><><><><>  -  Keep up the Great Work  !  <><><><><><><><><><><><><><><><><><><><><><><><><><><><><><><><><> <><><><><><><><><><><><><><><><><><><><><><><><><><><><><><><><><>

## 2022-03-07 ENCOUNTER — Telehealth: Payer: Self-pay | Admitting: *Deleted

## 2022-03-07 NOTE — Telephone Encounter (Signed)
   Name: Bryan [name]  DOB: Oct 05, 1950  MRN: [id number]  Primary Cardiologist: Sherren Mocha, MD  Chart reviewed as part of pre-operative protocol coverage. Because of Bryan [name]'s past medical history and time since last visit, he will require a follow-up in-office visit in order to better assess preoperative cardiovascular risk.  Pre-op covering staff: - Please schedule appointment and call patient to inform them. If patient already had an upcoming appointment within acceptable timeframe, please add "pre-op clearance" to the appointment notes so provider is aware. - Please contact requesting surgeon's office via preferred method (i.e, phone, fax) to inform them of need for appointment prior to surgery.  He has not been seen since 10/2020. Decision on plavix hold will be made at the time of his in-person appointment.    Elgie Collard, PA-C  03/07/2022, 3:35 PM

## 2022-03-07 NOTE — Telephone Encounter (Signed)
Pt scheduled to see Dr. Burt Knack, 03/27/22, clearance will be addressed at that time.  Will route to requesting surgeon's office to make them aware.

## 2022-03-07 NOTE — Telephone Encounter (Signed)
   Pre-operative Risk Assessment    Patient Name: Bryan [name]  DOB: 1950-10-31 MRN: [id number]     Request for Surgical Clearance    Procedure:  Dental Extraction - Amount of Teeth to be Pulled:  5 TEETH FOR EXTRACTION; AND PERFORM SITE PRESERVATION AT SITE TOOTH # 11 AND # 13 AND RIDGE AUGMENTATION AT THE #10 AREA UNDER IV SEDATION  Date of Surgery:  Clearance TBD                                 Surgeon:  DR. Buelah Manis, DDS Surgeon's Group or Practice Name:  [facility] Phone number:  [phone] Fax number:  [phone]   Type of Clearance Requested:   - Medical  - Pharmacy:  Hold Clopidogrel (Plavix)     Type of Anesthesia:   IV SEDATION   Additional requests/questions:    Jiles Prows   03/07/2022, 2:46 PM

## 2022-03-10 ENCOUNTER — Encounter: Payer: Self-pay | Admitting: Cardiovascular Disease

## 2022-03-10 ENCOUNTER — Ambulatory Visit: Payer: PPO | Admitting: Cardiovascular Disease

## 2022-03-14 ENCOUNTER — Ambulatory Visit: Payer: PPO | Admitting: Cardiovascular Disease

## 2022-03-14 ENCOUNTER — Encounter: Payer: Self-pay | Admitting: Cardiovascular Disease

## 2022-03-14 DIAGNOSIS — G4733 Obstructive sleep apnea (adult) (pediatric): Secondary | ICD-10-CM | POA: Diagnosis not present

## 2022-03-14 NOTE — Telephone Encounter (Signed)
error 

## 2022-03-21 ENCOUNTER — Encounter: Payer: Self-pay | Admitting: Nurse Practitioner

## 2022-03-21 ENCOUNTER — Ambulatory Visit: Payer: PPO | Attending: Cardiovascular Disease | Admitting: Nurse Practitioner

## 2022-03-21 VITALS — BP 110/70 | HR 71 | Ht 71.0 in | Wt 211.0 lb

## 2022-03-21 DIAGNOSIS — I493 Ventricular premature depolarization: Secondary | ICD-10-CM

## 2022-03-21 DIAGNOSIS — I2510 Atherosclerotic heart disease of native coronary artery without angina pectoris: Secondary | ICD-10-CM | POA: Diagnosis not present

## 2022-03-21 DIAGNOSIS — E785 Hyperlipidemia, unspecified: Secondary | ICD-10-CM | POA: Diagnosis not present

## 2022-03-21 DIAGNOSIS — I10 Essential (primary) hypertension: Secondary | ICD-10-CM | POA: Diagnosis not present

## 2022-03-21 DIAGNOSIS — Z01810 Encounter for preprocedural cardiovascular examination: Secondary | ICD-10-CM | POA: Diagnosis not present

## 2022-03-21 NOTE — Progress Notes (Signed)
Cardiology Office Note:    Date:  03/21/2022   ID:  Bryan, [name] 12-01-1950, MRN [id number]  PCP:  Bryan Pinto, MD   [facility] Providers Cardiologist:  Sherren Mocha, MD     Referring MD: Bryan Pinto, MD   Chief Complaint: Preoperative cardiac evaluation  History of Present Illness:    Bryan [name] is a very pleasant 72 y.o. male with a hx of CAD, HTN, HLD, and sleep apnea.   He initially presented in 2003 with ACS. Was found to have critical stenosis of the first OM branch of the circumflex and underwent PCI with a bare-metal stent at that time.  Was noted to have severe apical LAD stenosis and moderate 60 to 70% RCA stenoses associated with areas of aneurysmal dilatation.  LV function was normal with LVEF 65%.  Comorbid medical conditions include sleep apnea, HTN, and mixed dyslipidemia.  He also has a remote history of "tachycardia" and reports an ablation over 20 years ago by Dr. Ola Spurr at [facility].  Last cardiology clinic visit was 10/02/2020 with Dr. Burt Knack.  He had mild wheezing in the left lower lobe on exam.  Echocardiogram was ordered for evaluation of heart function. Outpatient event monitor ordered for evaluation of palpitations/symptomatic PVCs.  Monitor revealed NSR with average HR 78 bpm, no atrial fibrillation or flutter, no high-grade heart block or pathologic pauses, frequent PVCs with a burden of 6.7% and rare supraventricular beats without sustained arrhythmias.  2D echo revealed normal LVEF 60 to [id number], normal diastolic parameters, normal RV, no significant valve disease.  Today, he is here for cardiac clearance for dental extraction of 5 teeth. Request to hold Plavix for procedure. Was wearing a mouth guard for CPAP which was always too tight, snatched it off one morning and it broke a tooth.  Subsequently had damage to 2 other teeth. Reports he is otherwise doing well. Keeps NTG with him but no chest pain in 20 years. Goes out [place] skiing every  winter, works out on the elliptical and does weight lifting several days per week. Getting ready to sign up for a 6 week transformation program his sister is heading up, she is a fitness guru.  Admits he likes to eat pasta. He denies palpitations, presyncope, syncope, fatigue, orthopnea, PND, edema, shortness of breath. We discussed max heart rate for the fitness component of this program.  I have asked him to send me a message once he completes the program to let me know how it went.  Past Medical History:  Diagnosis Date   Allergy    CAD (coronary artery disease), native coronary artery    Cath 04/19/2001  normal Left main, moderate proximal disease, 80% stenosis distal LAD, 99% stenosis proximal OM 1, 60% stenosis mid RCA, 70% stenosis ostial mid PDA  PCI with bare metal stent of OM1 same date 3.5 x 18 mm Multilink     Cardiac arrhythmia due to congenital heart disease    Colon polyps    Diverticulosis    GERD (gastroesophageal reflux disease)    Hernia    History of PSVT    Ablation done in 2004 at [facility]    Hyperlipidemia    Hypertension    Sleep apnea    Symptomatic PVCs    Vitamin D deficiency     Past Surgical History:  Procedure Laterality Date   ACHILLES TENDON REPAIR     left   [place]  COLONOSCOPY     CORONARY ANGIOPLASTY WITH STENT PLACEMENT     years ago    Current Medications: Current Meds  Medication Sig   clopidogrel (PLAVIX) 75 MG tablet TAKE ONE TABLET BY MOUTH DAILY TO PREVENT BLOOD CLOTS   doxycycline (VIBRAMYCIN) 100 MG capsule Take 1 capsule 2 x /day with meals for Infection   ezetimibe (ZETIA) 10 MG tablet Take  1 tablet  Daily  for Cholesterol   nebivolol (BYSTOLIC) 10 MG tablet Take  1 tablet  Daily  for BP   neomycin-polymyxin b-dexamethasone (MAXITROL) 3.5-10000-0.1 OINT Apply to Rt lower eyelid  3 to 4 x / day   nitroGLYCERIN (NITROSTAT) 0.4 MG SL tablet Dissolve 1 tablet under  tongue every 3 to 5 minutes if needed for Angina   rosuvastatin (CRESTOR) 20 MG tablet Take  1 tablet Daily for  Cholesterol   tadalafil (CIALIS) 20 MG tablet Take  1/2 to 1 tablet   every 2 to 3 days   as needed for  XXXX   telmisartan (MICARDIS) 80 MG tablet Take  1 tablet  Daily  for BP   zolpidem (AMBIEN) 5 MG tablet TAKE ONE TABLET BY MOUTH EVERY EVENING AS NEEDED FOR SLEEP     Allergies:   Lisinopril, Amoxicillin, and Penicillins   Social History   Socioeconomic History   Marital status: Married    Spouse name: Not on file   Number of children: 3   Years of education: Not on file   Highest education level: Not on file  Occupational History   Occupation: retired  Tobacco Use   Smoking status: Never   Smokeless tobacco: Never  Vaping Use   Vaping Use: Never used  Substance and Sexual Activity   Alcohol use: Yes    Comment: rarely   Drug use: No   Sexual activity: Not on file  Other Topics Concern   Not on file  Social History Narrative   Drinks 2 cups of coffee a day    Social Determinants of Health   Financial Resource Strain: Not on file  Food Insecurity: No Food Insecurity (01/31/2021)   Hunger Vital Sign    Worried About Running Out of Food in the Last Year: Never true    Ran Out of Food in the Last Year: Never true  Transportation Needs: No Transportation Needs (01/31/2021)   PRAPARE - Hydrologist (Medical): No    Lack of Transportation (Non-Medical): No  Physical Activity: Not on file  Stress: Not on file  Social Connections: Not on file     Family History: The patient's family history includes Brain cancer in his father; Celiac disease in his brother; Colon polyps in his mother; Colonic polyp in his mother; Heart disease in his maternal grandfather; Hypothyroidism in his mother; Irritable bowel syndrome in his mother. There is no history of Esophageal cancer, Stomach cancer, Pancreatic cancer, Liver disease, Colon cancer, or  Rectal cancer.  ROS:   Please see the history of present illness.   All other systems reviewed and are negative.  Labs/Other Studies Reviewed:    The following studies were reviewed today:  Cardiac Studies & Procedures     STRESS TESTS  MYOCARDIAL PERFUSION IMAGING 06/20/2019   ECHOCARDIOGRAM  ECHOCARDIOGRAM COMPLETE 10/23/2020  Narrative ECHOCARDIOGRAM REPORT  IMPRESSIONS   1. Left ventricular ejection fraction, by estimation, is 60 to 65%. The left ventricle has normal function. The left ventricle has no regional wall motion abnormalities. Left ventricular  diastolic parameters were normal. 2. Right ventricular systolic function is normal. The right ventricular size is normal. Tricuspid regurgitation signal is inadequate for assessing PA pressure. 3. The mitral valve is grossly normal. Trivial mitral valve regurgitation. No evidence of mitral stenosis. 4. The aortic valve is tricuspid. There is mild thickening of the aortic valve. Aortic valve regurgitation is not visualized. No aortic stenosis is present. 5. The inferior vena cava is normal in size with greater than 50% respiratory variability, suggesting right atrial pressure of 3 mmHg.  Conclusion(s)/Recommendation(s): Normal biventricular function without evidence of hemodynamically significant valvular heart disease.  FINDINGS Left Ventricle: Left ventricular ejection fraction, by estimation, is 60 to 65%. The left ventricle has normal function. The left ventricle has no regional wall motion abnormalities. Global longitudinal strain performed but not reported based on interpreter judgement due to suboptimal tracking. The left ventricular internal cavity size was normal in size. There is no left ventricular hypertrophy. Left ventricular diastolic parameters were normal.  Right Ventricle: The right ventricular size is normal. No increase in right ventricular wall thickness. Right ventricular systolic function is normal.  Tricuspid regurgitation signal is inadequate for assessing PA pressure.  Left Atrium: Left atrial size was normal in size.  Right Atrium: Right atrial size was normal in size.  Pericardium: There is no evidence of pericardial effusion. Presence of pericardial fat pad.  Mitral Valve: The mitral valve is grossly normal. Trivial mitral valve regurgitation. No evidence of mitral valve stenosis.  Tricuspid Valve: The tricuspid valve is grossly normal. Tricuspid valve regurgitation is not demonstrated. No evidence of tricuspid stenosis.  Aortic Valve: The aortic valve is tricuspid. There is mild thickening of the aortic valve. Aortic valve regurgitation is not visualized. No aortic stenosis is present.  Pulmonic Valve: The pulmonic valve was grossly normal. Pulmonic valve regurgitation is not visualized. No evidence of pulmonic stenosis.  Aorta: The aortic root and ascending aorta are structurally normal, with no evidence of dilitation.  Venous: The right lower pulmonary vein is normal. The inferior vena cava is normal in size with greater than 50% respiratory variability, suggesting right atrial pressure of 3 mmHg.  IAS/Shunts: The atrial septum is grossly normal.      MONITORS  LONG TERM MONITOR (3-14 DAYS) 11/16/2020  Narrative Patch Wear Time:  9 days and 23 hours (2022-10-04T20:26:21-0400 to 2022-10-14T20:13:29-0400)  Patient had a min HR of 54 bpm, max HR of 203 bpm, and avg HR of 78 bpm. Predominant underlying rhythm was Sinus Rhythm. 2 Ventricular Tachycardia runs occurred, the run with the fastest interval lasting 4 beats with a max rate of 182 bpm, the longest lasting 4 beats with an avg rate of 101 bpm. 12 Supraventricular Tachycardia runs occurred, the run with the fastest interval lasting 5 beats with a max rate of 203 bpm, the longest lasting 19 beats with an avg rate of 122 bpm. Isolated SVEs were rare (<1.0%), SVE Couplets were rare (<1.0%), and SVE Triplets were rare  (<1.0%). Isolated VEs were frequent (6.7%, 71940), VE Couplets were rare (<1.0%, 79), and VE Triplets were rare (<1.0%, 1). Ventricular Bigeminy and Trigeminy were present.  SUMMARY: 1. The basic rhythm is normal sinus with an average HR of 78 bpm 2. No atrial fibrillation or flutter 3. No high-grade heart block or pathologic pauses 4. There are frequent PVC's and a burden of 6.7% and rare supraventricular beats without sustained arrhythmias 5. There is a ventricular triplet, but no prolonged ventricular runs.  Recent Labs: 03/04/2022: ALT 25; BUN 14; Creat 0.92; Hemoglobin 16.2; Magnesium 2.2; Platelets 231; Potassium 4.2; Sodium 140; TSH 1.85  Recent Lipid Panel    Component Value Date/Time   CHOL 133 03/04/2022 1550   CHOL 134 11/07/2019 0820   TRIG 219 (H) 03/04/2022 1550   HDL 50 03/04/2022 1550   HDL 51 11/07/2019 0820   CHOLHDL 2.7 03/04/2022 1550   VLDL 49 (H) 06/05/2016 0938   LDLCALC 54 03/04/2022 1550   LDLDIRECT 59 11/07/2019 0820     Risk Assessment/Calculations:      Physical Exam:    VS:  BP 110/70   Pulse 71   Ht 5\' 11"  (1.803 m)   Wt 211 lb (95.7 kg)   SpO2 98%   BMI 29.43 kg/m     Wt Readings from Last 3 Encounters:  03/21/22 211 lb (95.7 kg)  03/04/22 210 lb 6.4 oz (95.4 kg)  02/26/22 207 lb (93.9 kg)     GEN:  Well nourished, well developed in no acute distress HEENT: Normal NECK: No JVD; No carotid bruits CARDIAC: RRR, no murmurs, rubs, gallops RESPIRATORY:  Clear to auscultation without rales, wheezing or rhonchi  ABDOMEN: Soft, non-tender, non-distended MUSCULOSKELETAL:  No edema; No deformity. 2+ pedal pulses, equal bilaterally SKIN: Warm and dry NEUROLOGIC:  Alert and oriented x 3 PSYCHIATRIC:  Normal affect   EKG:  EKG is ordered today.  The ekg ordered today demonstrates normal sinus rhythm at 71 bpm, TWI III, aVF,  no acute change from previous    Diagnoses:    1. Preoperative cardiovascular examination   2.  Symptomatic PVCs   3. Essential hypertension   4. Hyperlipidemia LDL goal <70   5. Coronary artery disease involving native coronary artery of native heart without angina pectoris    Assessment and Plan:     Preoperative cardiac evaluation: According to the Revised Cardiac Risk Index (RCRI), his Perioperative Risk of Major Cardiac Event is (%): 0.9. His Functional Capacity in METs is: 9.89 according to the Duke Activity Status Index (DASI). The patient is doing well from a cardiac perspective. Therefore, based on ACC/AHA guidelines, the patient would be at acceptable risk for the planned procedure without further cardiovascular testing. Per office protocol, he may hold Plavix for 5 days prior to procedure and should resume as soon as hemodynamically stable postoperatively. I will forward clearance to requesting provider.  CAD without angina: History of BMS to OM1 2003, residual disease LAD, RCA. He denies chest pain, dyspnea, or other symptoms concerning for angina.  No indication for further ischemic evaluation at this time.  No bleeding concerns at this time.  Continue GDMT including telmisartan, rosuvastatin, nebivolol, ezetimibe, clopidogrel.  Hyperlipidemia LDL goal < 55: LDL 54, trigs 219 on 03/04/22. He admits to high carbohydrate intake. Plans to improve diet through a fitness program in the next few weeks. Continue ezetimibe and rosuvastatin.  Hypertension: BP is well controlled. Stable kidney function and electrolytes on labs completed 03/04/2022.  PVCs: Frequent PVCs with burden of 6.7% on monitor 11/2020. Reports these are not as bothersome as in the past. States since knowing they are benign he likely does not pay much attention. Continue nebivolol.     Disposition: 1 year with Dr. Burt Knack  Medication Adjustments/Labs and Tests Ordered: Current medicines are reviewed at length with the patient today.  Concerns regarding medicines are outlined above.  Orders Placed This Encounter   Procedures   EKG 12-Lead   No orders of the defined  types were placed in this encounter.   Patient Instructions  Medication Instructions:   Your physician recommends that you continue on your current medications as directed. Please refer to the Current Medication list given to you today.   *If you need a refill on your cardiac medications before your next appointment, please call your pharmacy*   Lab Work:  None ordered.  If you have labs (blood work) drawn today and your tests are completely normal, you will receive your results only by: [place] (if you have MyChart) OR A paper copy in the mail If you have any lab test that is abnormal or we need to change your treatment, we will call you to review the results.   Testing/Procedures:  None ordered.   Follow-Up: At [facility], you and your health needs are our priority.  As part of our continuing mission to provide you with exceptional heart care, we have created designated Provider Care Teams.  These Care Teams include your primary Cardiologist (physician) and Advanced Practice Providers (APPs -  Physician Assistants and Nurse Practitioners) who all work together to provide you with the care you need, when you need it.  We recommend signing up for the patient portal called "MyChart".  Sign up information is provided on this After Visit Summary.  MyChart is used to connect with patients for Virtual Visits (Telemedicine).  Patients are able to view lab/test results, encounter notes, upcoming appointments, etc.  Non-urgent messages can be sent to your provider as well.   To learn more about what you can do with MyChart, go to [email or web address].    Your next appointment:   1 year(s)  Provider:   Sherren Mocha, MD     Other Instructions  Your physician wants you to follow-up in: 1 year with Dr. Burt Knack. You will receive a reminder letter in the mail two months in advance. If you don't receive a  letter, please call our office to schedule the follow-up appointment.     Signed, Emmaline Life, NP  03/21/2022 2:31 PM    [place]

## 2022-03-21 NOTE — Patient Instructions (Signed)
Medication Instructions:   Your physician recommends that you continue on your current medications as directed. Please refer to the Current Medication list given to you today.   *If you need a refill on your cardiac medications before your next appointment, please call your pharmacy*   Lab Work:  None ordered.  If you have labs (blood work) drawn today and your tests are completely normal, you will receive your results only by: [place] (if you have MyChart) OR A paper copy in the mail If you have any lab test that is abnormal or we need to change your treatment, we will call you to review the results.   Testing/Procedures:  None ordered.   Follow-Up: At [facility], you and your health needs are our priority.  As part of our continuing mission to provide you with exceptional heart care, we have created designated Provider Care Teams.  These Care Teams include your primary Cardiologist (physician) and Advanced Practice Providers (APPs -  Physician Assistants and Nurse Practitioners) who all work together to provide you with the care you need, when you need it.  We recommend signing up for the patient portal called "MyChart".  Sign up information is provided on this After Visit Summary.  MyChart is used to connect with patients for Virtual Visits (Telemedicine).  Patients are able to view lab/test results, encounter notes, upcoming appointments, etc.  Non-urgent messages can be sent to your provider as well.   To learn more about what you can do with MyChart, go to [email or web address].    Your next appointment:   1 year(s)  Provider:   Sherren Mocha, MD     Other Instructions  Your physician wants you to follow-up in: 1 year with Dr. Burt Knack. You will receive a reminder letter in the mail two months in advance. If you don't receive a letter, please call our office to schedule the follow-up appointment.

## 2022-03-25 ENCOUNTER — Other Ambulatory Visit: Payer: Self-pay

## 2022-03-25 DIAGNOSIS — E782 Mixed hyperlipidemia: Secondary | ICD-10-CM

## 2022-03-25 MED ORDER — EZETIMIBE 10 MG PO TABS: ORAL_TABLET | ORAL | 3 refills | Status: DC

## 2022-03-27 ENCOUNTER — Ambulatory Visit: Payer: PPO | Admitting: Cardiovascular Disease

## 2022-03-27 ENCOUNTER — Other Ambulatory Visit: Payer: Self-pay | Admitting: Internal Medicine

## 2022-03-27 DIAGNOSIS — I10 Essential (primary) hypertension: Secondary | ICD-10-CM

## 2022-03-27 MED ORDER — TELMISARTAN 80 MG PO TABS: ORAL_TABLET | ORAL | 3 refills | Status: DC

## 2022-04-04 ENCOUNTER — Encounter: Payer: PPO | Admitting: Internal Medicine

## 2022-04-07 NOTE — Progress Notes (Signed)
Future Appointments  Date Time Provider Department  04/08/2022 11:30 AM Lucky CowboyMcKeown, Kameren Pargas, MD [facility]  06/18/2022                     wellness 11:00 AM Raynelle DickWilkinson, Dana E, NP [facility]  09/18/2022                     6 mo ov  9:30 AM Lucky CowboyMcKeown, Durga Saldarriaga, MD [facility]  12/18/2022                    9 mo ov   9:30 AM Raynelle DickWilkinson, Dana E, NP [facility]  02/26/2023  3:30 PM Dohmeier, Porfirio Mylararmen, MD [facility]  03/18/2023                       cpe 10:00 AM Lucky CowboyMcKeown, Rudean Icenhour, MD [facility]    History of Present Illness:      This very nice 72 y.o.  [name] with  HTN, ASCAD/Stents, HLD, Prediabetes and Vitamin D Deficiency . Patient returns for f/u post cryo surgery of multiple irritated  seborrheic keratoses. Patient still c/o several areas with intense itching & crusting.     Current Outpatient Medications on File Prior to Visit  Medication Sig   clopidogrel (PLAVIX) 75 MG tablet TAKE ONE TABLET DAILY    doxycycline (VIBRAMYCIN) 100 MG capsule Take 1 capsule 2 x /day with meals for Infection   ezetimibe (ZETIA) 10 MG tablet Take  1 tablet  Daily  for Cholesterol   nebivolol (BYSTOLIC) 10 MG tablet Take  1 tablet  Daily  for BP   MAXITROL 3.5-10000-0.1 OINT Apply to Rt lower eyelid  3 to 4 x / day   NITROSTAT 0.4 MG SL tablet if needed for Angina   rosuvastatin (CRESTOR) 20 MG tablet Take  1 tablet Daily for  Cholesterol   tadalafil (CIALIS) 20 MG tablet Take  1/2 to 1 tablet   every 2 to 3 days   as needed for  XXXX   telmisartan (MICARDIS) 80 MG tablet Take  1 tablet  Daily  for BP   zolpidem (AMBIEN) 5 MG tablet TAKE ONE TABLET  EVERY EVENING AS NEEDED FOR SLEEP     Allergies  Allergen Reactions   Lisinopril Cough    ACEi cough   Amoxicillin Hives and Swelling   Penicillins Swelling     Problem list He has Hyperlipidemia, mixed; Essential hypertension; Vitamin D deficiency; Abnormal glucose; Antiplatelet or antithrombotic long-term use; Coronary artery disease involving native coronary  artery of native heart without angina pectoris; History of PSVT; Symptomatic PVCs; CKD (chronic kidney disease) stage 2, GFR 60-89 ml/min; Obesity (BMI 30.0-34.9); Insomnia; ED (erectile dysfunction); Aorto-iliac atherosclerosis (HCC) by Pelvic CT scan 03/2016; Psychophysiological insomnia; Dental anomaly; Severe obstructive sleep apnea-hypopnea syndrome; OSA on CPAP; Moderate obstructive sleep apnea-hypopnea syndrome; Intolerance of continuous positive airway pressure (CPAP) ventilation; Short sleeper; Dependence on CPAP ventilation; and History of colonic polyps on their problem list.   Observations/Objective:  BP 134/80   Pulse 82   Temp 97.9 F (36.6 C)   Resp 17   Ht 5\' 11"  (1.803 m)   Wt 214 lb 6.4 oz (97.3 kg)   SpO2 96%   BMI 29.90 kg/m   Skin focused exam finds previously treated areas clean , healing erythema.  Procedure ( CPT 17000 & 17000 x 5)  After informed consent,    #  6 more thick raised crusty irritated seborrheic keratoses approx 5 x 5 mm  scattered over the trunk . Patient again advised of post treatment care    Assessment and Plan:   1. Essential hypertension   2. Seborrheic keratoses, inflamed  - PR DESTRUCTION PREMALIGNANT LESION 1ST - PR DESTRUCTION PREMALIGNANT LESION 2-14 EA  3. Hyperlipidemia, mixed - Meds refilled   - rosuvastatin (CRESTOR) 20 MG tablet; Take  1 tablet Daily for  Cholesterol  Dispense: 90 tablet; Refill: 3  4. Aorto-iliac atherosclerosis (HCC) by Pelvic CT scan 03/2016  - rosuvastatin (CRESTOR) 20 MG tablet; Take  1 tablet Daily for  Cholesterol  Dispense: 90 tablet; Refill: 3  5. Coronary artery disease involving native coronary artery of native heart without angina pectoris  - rosuvastatin (CRESTOR) 20 MG tablet; Take  1 tablet Daily for  Cholesterol  Dispense: 90 tablet; Refill: 3    Follow Up Instructions:        I discussed the assessment and treatment plan with the  patient. The patient was provided an opportunity to ask questions and all were answered. The patient agreed with the plan and demonstrated an understanding of the instructions.       The patient was advised to call back or seek an in-person evaluation if the symptoms worsen or if the condition fails to improve as anticipated.    Marinus MawWilliam D Adell Koval, MD

## 2022-04-08 ENCOUNTER — Ambulatory Visit (INDEPENDENT_AMBULATORY_CARE_PROVIDER_SITE_OTHER): Payer: PPO | Admitting: Internal Medicine

## 2022-04-08 ENCOUNTER — Encounter: Payer: Self-pay | Admitting: Internal Medicine

## 2022-04-08 VITALS — BP 134/80 | HR 82 | Temp 97.9°F | Resp 17 | Ht 71.0 in | Wt 214.4 lb

## 2022-04-08 DIAGNOSIS — I708 Atherosclerosis of other arteries: Secondary | ICD-10-CM | POA: Diagnosis not present

## 2022-04-08 DIAGNOSIS — L820 Inflamed seborrheic keratosis: Secondary | ICD-10-CM

## 2022-04-08 DIAGNOSIS — I2510 Atherosclerotic heart disease of native coronary artery without angina pectoris: Secondary | ICD-10-CM

## 2022-04-08 DIAGNOSIS — I10 Essential (primary) hypertension: Secondary | ICD-10-CM | POA: Diagnosis not present

## 2022-04-08 DIAGNOSIS — I700 Atherosclerosis of aorta: Secondary | ICD-10-CM | POA: Diagnosis not present

## 2022-04-08 DIAGNOSIS — E782 Mixed hyperlipidemia: Secondary | ICD-10-CM | POA: Diagnosis not present

## 2022-04-08 MED ORDER — ROSUVASTATIN CALCIUM 20 MG PO TABS: ORAL_TABLET | ORAL | 3 refills | Status: DC

## 2022-04-12 ENCOUNTER — Encounter: Payer: Self-pay | Admitting: Internal Medicine

## 2022-04-15 ENCOUNTER — Other Ambulatory Visit: Payer: Self-pay | Admitting: Internal Medicine

## 2022-04-15 DIAGNOSIS — I10 Essential (primary) hypertension: Secondary | ICD-10-CM

## 2022-04-15 DIAGNOSIS — I2510 Atherosclerotic heart disease of native coronary artery without angina pectoris: Secondary | ICD-10-CM

## 2022-04-29 ENCOUNTER — Ambulatory Visit: Payer: PPO | Admitting: Physician Assistant

## 2022-05-06 ENCOUNTER — Other Ambulatory Visit: Payer: Self-pay | Admitting: Internal Medicine

## 2022-05-06 MED ORDER — GABAPENTIN 600 MG PO TABS: ORAL_TABLET | ORAL | 0 refills | Status: DC

## 2022-05-08 ENCOUNTER — Other Ambulatory Visit: Payer: Self-pay

## 2022-05-08 DIAGNOSIS — Z1211 Encounter for screening for malignant neoplasm of colon: Secondary | ICD-10-CM

## 2022-05-08 DIAGNOSIS — Z1212 Encounter for screening for malignant neoplasm of rectum: Secondary | ICD-10-CM | POA: Diagnosis not present

## 2022-05-08 LAB — POC HEMOCCULT BLD/STL (HOME/3-CARD/SCREEN)
Card #2 Fecal Occult Blod, POC: NEGATIVE
Card #3 Fecal Occult Blood, POC: NEGATIVE
Fecal Occult Blood, POC: NEGATIVE

## 2022-06-17 NOTE — Progress Notes (Unsigned)
MEDICARE ANNUAL WELLNESS VISIT AND FOLLOW UP Assessment:   Diagnoses and all orders for this visit:  Encounter for Medicare annual wellness exam Due Annually  Aortoiliac atherosclerosis (HCC) Per CT 2018 Control blood pressure, cholesterol, glucose, increase exercise.   Symptomatic PVCs Has very rarely; symptoms stable recently.  Continue with medications, followed by cardiology.   Essential hypertension BPs at goal  Monitor blood pressure at home; call if consistently over 130/80 Continue DASH diet.   Reminder to go to the ER if any CP, SOB, nausea, dizziness, severe HA, changes vision/speech, left arm numbness and tingling and jaw pain. CBC  History of PSVT Followed by cardiology  Coronary artery disease involving native coronary artery of native heart, angina presence unspecified Control blood pressure, cholesterol, glucose, increase exercise.  Followed by cardiology  OSA (obstructive sleep apnea) Followed by Dr. Vickey Huger; recent sleep study recommended CPAP, pending titration  CKD (chronic kidney disease) stage 2, GFR 60-89 ml/min Increase fluids, avoid NSAIDS, monitor sugars, will monitor Microalbumin/creatinine urine ratio  Vitamin D deficiency Continue supplementation for goal of 70-100 Defer vitamin D level  Abnormal glucose Recent A1Cs at goal Discussed diet/exercise, weight management  Defer A1C; check CMP  Overweight (25-29.9) Long discussion about weight loss, diet, and exercise Recommended diet heavy in fruits and veggies and low in animal meats, cheeses, and dairy products, appropriate calorie intake Discussed appropriate weight for height  Patient on phentermine with benefit and no SE, taking drug breaks;  Follow up at next visit  Medication management CBC, CMP/GFR  Primary insomnia Followed by Dr. Vickey Huger, prescribed Bennetta Laos sleep hygiene discussed, increase day time activity Continue with careful use of sleep aid agents  Mixed  hyperlipidemia Triglycerides remain signicantly elevated; recently increased rosuvastatin to 20 mg, titrate up as needed, reduce creamer and sugar in coffee Continue statin/zetia Continue low cholesterol diet and exercise.  Check lipid panel.   Erectile dysfunction Continue cialis PRN    Over 30 minutes of exam, counseling, chart review, and critical decision making was performed  Future Appointments  Date Time Provider Department Center  06/18/2022 11:00 AM Raynelle Dick, NP [facility] None  09/18/2022  9:30 AM Lucky Cowboy, MD [facility] None  12/18/2022  9:30 AM Raynelle Dick, NP [facility] None  02/26/2023  3:30 PM Dohmeier, Porfirio Mylar, MD [facility] None  03/18/2023 10:00 AM Lucky Cowboy, MD [facility] None  06/19/2023 10:30 AM Raynelle Dick, NP [facility] None    Plan:   During the course of the visit the patient was educated and counseled about appropriate screening and preventive services including:   Pneumococcal vaccine  Influenza vaccine Prevnar 13 Td vaccine Screening electrocardiogram Colorectal cancer screening Diabetes screening Glaucoma screening Nutrition counseling    Subjective:  Bryan [name] is a 72 y.o. male who presents for Medicare Annual Wellness Visit and 3 month follow up for HTN, hyperlipidemia, glucose management, and vitamin D Def.   He has ongoing difficulties with sleep,  Dr. Vickey Huger had sleep study which showed severe OSA and recommended CPAP. She is prescribing ambien.  He reports back and hip pain continues to have intermittently , gabapenitn does help. It is currently approved   BMI is There is no height or weight on file to calculate BMI., he has been working on diet and exercise. Doing stretching exercises, walking, calisthenics, redoing yard, does a lot of outdoor activities, boating, etc. Works out 3 days a week with weight and walks a couple miles Systems analyst Readings from Last 3 Encounters:  04/08/22 214 lb 6.4 oz  (97.3 kg)  03/21/22 211 lb (95.7 kg)  03/04/22 210 lb 6.4 oz (95.4 kg)   He has hx of PSVT/PVCs but recently improved without episodes in several years.  Patient has hx/o ASCAD with ACS/acute Anterior NSTEMI and PCA w/Stent implantation in 2003. He is on plavix. In 2004 , he had an Ablation for pAfib. Stress Myoview was negative in 2010. Continues on plavix. Recently transitioned to seeing Dr. Excell Seltzer.   His blood pressure has been controlled at home, today their BP is   BP Readings from Last 3 Encounters:  04/08/22 134/80  03/21/22 110/70  03/04/22 112/72    He does workout. He denies chest pain, shortness of breath, dizziness.   He is on cholesterol medication (zetia 10 mg daily, rosuvastatin 20 g, omega 3) and denies myalgias. His cholesterol is at goal. The cholesterol last visit was:    Lab Results  Component Value Date   CHOL 133 03/04/2022   HDL 50 03/04/2022   LDLCALC 54 03/04/2022   LDLDIRECT 59 11/07/2019   TRIG 219 (H) 03/04/2022   CHOLHDL 2.7 03/04/2022   He has been working on diet and exercise for glucose management, and denies foot ulcerations, increased appetite, nausea, paresthesia of the feet, polydipsia, polyuria, visual disturbances, vomiting and weight loss. Last A1C in the office was:  Lab Results  Component Value Date   HGBA1C 5.4 03/04/2022   He does drink a lot of fluids. Last GFR Lab Results  Component Value Date   EGFR 89 03/04/2022     Patient is on Vitamin D supplement and near 60:    Lab Results  Component Value Date   VD25OH 71 02/26/2021        Current Outpatient Medications on File Prior to Visit  Medication Sig Dispense Refill   clopidogrel (PLAVIX) 75 MG tablet TAKE ONE TABLET BY MOUTH DAILY TO PREVENT BLOOD CLOTS 90 tablet 3   doxycycline (VIBRAMYCIN) 100 MG capsule Take 1 capsule 2 x /day with meals for Infection 30 capsule 1   ezetimibe (ZETIA) 10 MG tablet Take  1 tablet  Daily  for Cholesterol 90 tablet 3   gabapentin (NEURONTIN)  600 MG tablet Take  1/2 to 1 tablet  3 x /day  as needed for Pain 270 tablet 0   nebivolol (BYSTOLIC) 10 MG tablet TAKE ONE TABLET BY MOUTH DAILY FOR BLOOD PRESSURE 90 tablet 3   neomycin-polymyxin b-dexamethasone (MAXITROL) 3.5-10000-0.1 OINT Apply to Rt lower eyelid  3 to 4 x / day 3.5 g 2   nitroGLYCERIN (NITROSTAT) 0.4 MG SL tablet Dissolve 1 tablet under tongue every 3 to 5 minutes if needed for Angina 50 tablet 11   rosuvastatin (CRESTOR) 20 MG tablet Take  1 tablet Daily for  Cholesterol 90 tablet 3   tadalafil (CIALIS) 20 MG tablet Take  1/2 to 1 tablet   every 2 to 3 days   as needed for  XXXX 30 tablet 1   telmisartan (MICARDIS) 80 MG tablet Take  1 tablet  Daily  for BP 90 tablet 3   zolpidem (AMBIEN) 5 MG tablet TAKE ONE TABLET BY MOUTH EVERY EVENING AS NEEDED FOR SLEEP 30 tablet 5   No current facility-administered medications on file prior to visit.     Allergies: Allergies  Allergen Reactions   Lisinopril Cough    ACEi cough   Amoxicillin Hives and Swelling   Penicillins Swelling    Current Problems (verified) has Hyperlipidemia, mixed;  Essential hypertension; Vitamin D deficiency; Abnormal glucose; Antiplatelet or antithrombotic long-term use; Coronary artery disease involving native coronary artery of native heart without angina pectoris; History of PSVT; Symptomatic PVCs; CKD (chronic kidney disease) stage 2, GFR 60-89 ml/min; Obesity (BMI 30.0-34.9); Insomnia; ED (erectile dysfunction); Aorto-iliac atherosclerosis (HCC) by Pelvic CT scan 03/2016; Psychophysiological insomnia; Dental anomaly; Severe obstructive sleep apnea-hypopnea syndrome; OSA on CPAP; Moderate obstructive sleep apnea-hypopnea syndrome; Intolerance of continuous positive airway pressure (CPAP) ventilation; Short sleeper; Dependence on CPAP ventilation; and History of colonic polyps on their problem list.  Screening Tests Immunization History  Administered Date(s) Administered   Influenza Split  10/13/2013, 10/23/2014   Influenza, High Dose Seasonal PF 01/09/2017, 10/27/2017, 11/21/2019, 09/27/2020, 09/30/2021   Influenza,inj,quad, With Preservative 01/13/2013   Influenza-Unspecified 10/07/2011   PPD Test 10/13/2013, 10/23/2014   Pneumococcal Conjugate-13 10/13/2013   Pneumococcal Polysaccharide-23 10/23/2014, 02/21/2020   Td 01/06/2006   Tdap 03/06/2016   Zoster, Live 09/22/2011   Preventative care: Last colonoscopy: 05/2018 next due 05/2021 CXR 2019 MRI lumbar/hip 2017 Sleep study 04/2019  Prior vaccinations: TD or Tdap: 2018  Influenza:  Pneumococcal:02/21/20 Prevnar13: 2015 Shingles/Zostavax: 2013 Covid 19: 2/2, 2021, moderna   Names of Other Physician/Practitioners you currently use: 1. [facility] here for primary care 2. Lens, eye doctor, last visit 05/2021 3. Turner, dentist, last visit 09/04/21  Patient Care Team: Lucky Cowboy, MD as PCP - General (Internal Medicine) Tonny Bollman, MD as PCP - Cardiology (Cardiology) Charlett Nose, [facility] (Inactive) as Pharmacist (Pharmacist)  Surgical: He  has a past surgical history that includes Appendectomy; Achilles tendon repair; Cardiac electrophysiology mapping and ablation; Coronary angioplasty with stent; and Colonoscopy. Family His family history includes Brain cancer in his father; Celiac disease in his brother; Colon polyps in his mother; Colonic polyp in his mother; Heart disease in his maternal grandfather; Hypothyroidism in his mother; Irritable bowel syndrome in his mother. Social history  He reports that he has never smoked. He has never used smokeless tobacco. He reports current alcohol use. He reports that he does not use drugs.  MEDICARE WELLNESS OBJECTIVES: Physical activity:   Cardiac risk factors:   Depression/mood screen:      09/30/2021    3:50 PM  Depression screen PHQ 2/9  Decreased Interest 1  Down, Depressed, Hopeless 0  PHQ - 2 Score 1    ADLs:      09/30/2021    3:49 PM  In your present state of health, do you have any difficulty performing the following activities:  Hearing? 0  Vision? 0  Difficulty concentrating or making decisions? 0  Walking or climbing stairs? 0  Dressing or bathing? 0  Doing errands, shopping? 0     Cognitive Testing  Alert? Yes  Normal Appearance?Yes  Oriented to person? Yes  Place? Yes   Time? Yes  Recall of three objects?  Yes  Can perform simple calculations? Yes  Displays appropriate judgment?Yes  Can read the correct time from a watch face?Yes  EOL planning:    Review of Systems  Constitutional:  Negative for chills, fever and weight loss.  HENT:  Negative for congestion and hearing loss.   Eyes:  Negative for blurred vision and double vision.  Respiratory:  Negative for cough and shortness of breath.   Cardiovascular:  Negative for chest pain, palpitations, orthopnea and leg swelling.  Gastrointestinal:  Negative for abdominal pain, constipation, diarrhea, heartburn, nausea and vomiting.  Musculoskeletal:  Negative for falls, joint pain and myalgias.  Skin:  Negative for rash.  Neurological:  Negative for dizziness, tingling, tremors, loss of consciousness and headaches.  Psychiatric/Behavioral:  Negative for depression, memory loss and suicidal ideas.      Objective:   There were no vitals filed for this visit.   There is no height or weight on file to calculate BMI.  General Appearance: Well nourished, in no apparent distress. Eyes: PERRLA, EOMs, conjunctiva no swelling or erythema Sinuses: No Frontal/maxillary tenderness ENT/Mouth: Ext aud canals clear, TMs without erythema, bulging. No erythema, swelling, or exudate on post pharynx.  Tonsils not swollen or erythematous. Hearing normal.  Neck: Supple, thyroid normal.  Respiratory: Respiratory effort normal, BS equal bilaterally without rales, rhonchi, wheezing or stridor.  Cardio: RRR with no MRGs. Brisk peripheral pulses  without edema.  Abdomen: Soft, + BS.  Non tender, no guarding, rebound, hernias, masses. Lymphatics: Non tender without lymphadenopathy.  Musculoskeletal: Full ROM, 5/5 strength, Normal gait.  Skin: Warm, dry without rashes, lesions, ecchymosis.  Neuro: Cranial nerves intact. No cerebellar symptoms.  Psych: Awake and oriented X 3, normal affect, Insight and Judgment appropriate.   Medicare Attestation I have personally reviewed: The patient's medical and social history Their use of alcohol, tobacco or illicit drugs Their current medications and supplements The patient's functional ability including ADLs,fall risks, home safety risks, cognitive, and hearing and visual impairment Diet and physical activities Evidence for depression or mood disorders  The patient's weight, height, BMI, and visual acuity have been recorded in the chart.  I have made referrals, counseling, and provided education to the patient based on review of the above and I have provided the patient with a written personalized care plan for preventive services.     Revonda Humphrey ANP-C  [facility]  06/17/2022

## 2022-06-18 ENCOUNTER — Encounter: Payer: Self-pay | Admitting: Nurse Practitioner

## 2022-06-18 ENCOUNTER — Ambulatory Visit (INDEPENDENT_AMBULATORY_CARE_PROVIDER_SITE_OTHER): Payer: PPO | Admitting: Nurse Practitioner

## 2022-06-18 VITALS — BP 108/70 | HR 57 | Temp 97.5°F | Ht 71.0 in | Wt 217.0 lb

## 2022-06-18 DIAGNOSIS — F5101 Primary insomnia: Secondary | ICD-10-CM | POA: Diagnosis not present

## 2022-06-18 DIAGNOSIS — E663 Overweight: Secondary | ICD-10-CM

## 2022-06-18 DIAGNOSIS — R7309 Other abnormal glucose: Secondary | ICD-10-CM

## 2022-06-18 DIAGNOSIS — E559 Vitamin D deficiency, unspecified: Secondary | ICD-10-CM

## 2022-06-18 DIAGNOSIS — I2510 Atherosclerotic heart disease of native coronary artery without angina pectoris: Secondary | ICD-10-CM

## 2022-06-18 DIAGNOSIS — I10 Essential (primary) hypertension: Secondary | ICD-10-CM | POA: Diagnosis not present

## 2022-06-18 DIAGNOSIS — R6889 Other general symptoms and signs: Secondary | ICD-10-CM

## 2022-06-18 DIAGNOSIS — Z0001 Encounter for general adult medical examination with abnormal findings: Secondary | ICD-10-CM

## 2022-06-18 DIAGNOSIS — N529 Male erectile dysfunction, unspecified: Secondary | ICD-10-CM | POA: Diagnosis not present

## 2022-06-18 DIAGNOSIS — N182 Chronic kidney disease, stage 2 (mild): Secondary | ICD-10-CM

## 2022-06-18 DIAGNOSIS — I493 Ventricular premature depolarization: Secondary | ICD-10-CM

## 2022-06-18 DIAGNOSIS — E782 Mixed hyperlipidemia: Secondary | ICD-10-CM | POA: Diagnosis not present

## 2022-06-18 DIAGNOSIS — Z79899 Other long term (current) drug therapy: Secondary | ICD-10-CM | POA: Diagnosis not present

## 2022-06-18 DIAGNOSIS — I471 Supraventricular tachycardia, unspecified: Secondary | ICD-10-CM | POA: Diagnosis not present

## 2022-06-18 DIAGNOSIS — Z0000 Encounter for general adult medical examination without abnormal findings: Secondary | ICD-10-CM

## 2022-06-18 DIAGNOSIS — I700 Atherosclerosis of aorta: Secondary | ICD-10-CM

## 2022-06-18 DIAGNOSIS — I708 Atherosclerosis of other arteries: Secondary | ICD-10-CM

## 2022-06-18 NOTE — Patient Instructions (Signed)

## 2022-06-19 LAB — COMPLETE METABOLIC PANEL WITH GFR
AG Ratio: 2.3 (calc) (ref 1.0–2.5)
ALT: 32 U/L (ref 9–46)
AST: 22 U/L (ref 10–35)
Albumin: 4.3 g/dL (ref 3.6–5.1)
Alkaline phosphatase (APISO): 59 U/L (ref 35–144)
BUN: 15 mg/dL (ref 7–25)
CO2: 27 mmol/L (ref 20–32)
Calcium: 9.1 mg/dL (ref 8.6–10.3)
Chloride: 105 mmol/L (ref 98–110)
Creat: 0.97 mg/dL (ref 0.70–1.28)
Globulin: 1.9 g/dL (calc) (ref 1.9–3.7)
Glucose, Bld: 101 mg/dL — ABNORMAL HIGH (ref 65–99)
Potassium: 4.8 mmol/L (ref 3.5–5.3)
Sodium: 139 mmol/L (ref 135–146)
Total Bilirubin: 0.9 mg/dL (ref 0.2–1.2)
Total Protein: 6.2 g/dL (ref 6.1–8.1)
eGFR: 83 mL/min/{1.73_m2} (ref >=60)

## 2022-06-19 LAB — CBC WITH DIFFERENTIAL/PLATELET
Absolute Monocytes: 566 cells/uL (ref 200–950)
Basophils Absolute: 39 cells/uL (ref 0–200)
Basophils Relative: 0.7 %
Eosinophils Absolute: 314 cells/uL (ref 15–500)
Eosinophils Relative: 5.6 %
HCT: 47 % (ref 38.5–50.0)
Hemoglobin: 16 g/dL (ref 13.2–17.1)
Lymphs Abs: 1915 cells/uL (ref 850–3900)
MCH: 29.1 pg (ref 27.0–33.0)
MCHC: 34 g/dL (ref 32.0–36.0)
MCV: 85.6 fL (ref 80.0–100.0)
MPV: 11 fL (ref 7.5–12.5)
Monocytes Relative: 10.1 %
Neutro Abs: 2766 cells/uL (ref 1500–7800)
Neutrophils Relative %: 49.4 %
Platelets: 205 10*3/uL (ref 140–400)
RBC: 5.49 10*6/uL (ref 4.20–5.80)
RDW: 12.3 % (ref 11.0–15.0)
Total Lymphocyte: 34.2 %
WBC: 5.6 10*3/uL (ref 3.8–10.8)

## 2022-06-19 LAB — LIPID PANEL
Cholesterol: 107 mg/dL (ref <200)
HDL: 41 mg/dL (ref >=40)
LDL Cholesterol (Calc): 40 mg/dL (calc)
Non-HDL Cholesterol (Calc): 66 mg/dL (calc) (ref <130)
Total CHOL/HDL Ratio: 2.6 (calc) (ref <5.0)
Triglycerides: 189 mg/dL — ABNORMAL HIGH (ref <150)

## 2022-06-19 LAB — VITAMIN D 25 HYDROXY (VIT D DEFICIENCY, FRACTURES): Vit D, 25-Hydroxy: 44 ng/mL (ref 30–100)

## 2022-08-04 ENCOUNTER — Other Ambulatory Visit: Payer: Self-pay | Admitting: Nurse Practitioner

## 2022-08-04 DIAGNOSIS — M5410 Radiculopathy, site unspecified: Secondary | ICD-10-CM

## 2022-08-04 DIAGNOSIS — M48061 Spinal stenosis, lumbar region without neurogenic claudication: Secondary | ICD-10-CM

## 2022-08-04 MED ORDER — GABAPENTIN 600 MG PO TABS
ORAL_TABLET | ORAL | 0 refills | Status: DC
Start: 2022-08-04 — End: 2022-11-03

## 2022-08-22 ENCOUNTER — Telehealth: Payer: Self-pay | Admitting: Cardiovascular Disease

## 2022-08-22 NOTE — Telephone Encounter (Signed)
   Pre-operative Risk Assessment    Patient Name: Bryan [name]  DOB: 25-Jun-1950 MRN: [id number]     Request for Surgical Clearance    Procedure:   Implant Placement at Number 13 area  Date of Surgery:  Clearance 08/26/22                                 Surgeon:  Dr. Jeanice Lim Surgeon's Group or Practice Name:  [facility] Phone number:  [phone] Fax number:  [phone]   Type of Clearance Requested:   - Medical  - Pharmacy:  Hold Clopidogrel (Plavix) how long to hold plavix   Type of Anesthesia:  Local   Additional requests/questions:  n/a  Cathey Endow   08/22/2022, 9:05 AM

## 2022-08-22 NOTE — Telephone Encounter (Signed)
I s/w the pre op APP today Edd Fabian, FNP in regard to clearance request received today for procedure Tuesday 08/26/22 for a dental implant. Pt needs a tele appt for preop clearance. Per pre op APP today pt will need to reschedule his dental procedure as there are no openings for a tele appt.   I did call the DDS office and explained this to [facility] the scheduler for the DDS. Archie Patten will call the pt and let him know they have to reschedule his dental procedure and to call our office to schedule a tele pre op appt. I informed Archie Patten my first tele appt is 09/02/22 @ 9 am, though not sure if it will be there when the pt calls back.

## 2022-08-22 NOTE — Telephone Encounter (Signed)
   Name: Bryan [name]  DOB: [birth year]  MRN: [id number]  Primary Cardiologist: Tonny Bollman, MD   Preoperative team, please contact this patient and set up a phone call appointment for further preoperative risk assessment. Please obtain consent and complete medication review. Thank you for your help.  I confirm that guidance regarding antiplatelet and oral anticoagulation therapy has been completed and, if necessary, noted below.  His Plavix may be held for 5 days prior to his procedure.  Please resume as soon as hemostasis is achieved   Ronney Asters, NP 08/22/2022, 11:46 AM [facility]

## 2022-08-25 NOTE — Telephone Encounter (Signed)
Bryan [name] reports that pt has been holding his plavix for 5 days and they wont need cardiac clearance.

## 2022-08-25 NOTE — Telephone Encounter (Signed)
I left message for Bryan [name] that I was following up on the pt. Pt is upset that he needs a tele pre op appt for his dental procedure and that our first tele appt last week was 09/02/22. Our first tele pre op appt is now 09/03/22.   Tobi Bastos, with dental office called back. Tobi Bastos, stated that their office will follow the guidelines that the cardiologist gives. I did tell her that the pt is still going to need a tele pre op appt in order for the request to be signed off. Tobi Bastos, agreed and she will let the pt know. I told Tobi Bastos that I have one tele appt left on 09/03/22 @ 2:40 and that I will hold that for the pt and wait for her to call me back.

## 2022-08-30 ENCOUNTER — Other Ambulatory Visit: Payer: Self-pay | Admitting: Neurology

## 2022-09-01 ENCOUNTER — Encounter: Payer: Self-pay | Admitting: Neurology

## 2022-09-10 DIAGNOSIS — Z85828 Personal history of other malignant neoplasm of skin: Secondary | ICD-10-CM | POA: Diagnosis not present

## 2022-09-10 DIAGNOSIS — L570 Actinic keratosis: Secondary | ICD-10-CM | POA: Diagnosis not present

## 2022-09-10 DIAGNOSIS — D1801 Hemangioma of skin and subcutaneous tissue: Secondary | ICD-10-CM | POA: Diagnosis not present

## 2022-09-10 DIAGNOSIS — L821 Other seborrheic keratosis: Secondary | ICD-10-CM | POA: Diagnosis not present

## 2022-09-10 DIAGNOSIS — L820 Inflamed seborrheic keratosis: Secondary | ICD-10-CM | POA: Diagnosis not present

## 2022-09-10 DIAGNOSIS — L814 Other melanin hyperpigmentation: Secondary | ICD-10-CM | POA: Diagnosis not present

## 2022-09-17 ENCOUNTER — Encounter: Payer: Self-pay | Admitting: Internal Medicine

## 2022-09-17 NOTE — Patient Instructions (Signed)

## 2022-09-17 NOTE — Progress Notes (Signed)
Future Appointments  Date Time Provider Department  09/18/2022                6 mo   9:30 AM Lucky Cowboy, MD [facility]  12/18/2022              9 mo  9:30 AM Raynelle Dick, NP [facility]  02/26/2023  3:30 PM Dohmeier, Porfirio Mylar, MD [facility]  03/18/2023                cpe 10:00 AM Lucky Cowboy, MD [facility]  06/19/2023               wellness  10:30 AM Raynelle Dick, NP [facility]    History of Present Illness:       This very nice 72 y.o.  [name] with  HTN, ASCAD/Stents, HLD, Prediabetes and Vitamin D Deficiency  presents for 6 month follow up . Patient is  followed by Dr Vickey Huger for  severe OSA & uses an oral device for  intolerance to CPAP.  Abd CT scan in 2018 showed Aorto-Iliac  Atherosclerosis.           The patient has decided that he is Testosterone deficient causing him to feel fatigued & he was advised that it is not recommended to check testosterone levels in males over 105 yo ( he's 72 yo) and was advised of the potential negative consequences of testosterone therapy as increased blood clotting ( DVT, PE, MI, CVA )  and Prostate cancer)        Patient is treated for HTN (1990) & BP has been controlled at home.  Today's BP is at goal - 104/68 .    In 2003,  at age 51 yo, patient had PCA/Stent rescue of ACS/NSTEMI and in 2004 & in 2010, he had Ablations for pAfib. Patient has had no complaints of any recent cardiac type chest pain, palpitations, dyspnea Pollyann Kennedy /PND, dizziness, claudication or dependent edema.        Hyperlipidemia is controlled with diet & Rosuvastatin /Ezetimibe. Patient denies myalgias or other med SE's. Last Lipids were at goal  except slightly elevated Triglycerides :  Lab Results  Component Value Date   CHOL 107 06/18/2022   HDL 41 06/18/2022   LDLCALC 40 06/18/2022   LDLDIRECT 59 11/07/2019   TRIG 189 (H) 06/18/2022   CHOLHDL 2.6 06/18/2022     Also, the patient has history of PreDiabetes  (A1c 5.8% /2019) and has had no symptoms of  reactive hypoglycemia, diabetic polys, paresthesias or visual blurring.  Last A1c was normal & at goal :  Lab Results  Component Value Date   HGBA1C 5.4 03/04/2022                                                      Further, the patient also has history of Vitamin D Deficiency ("26" /2008)  and supplements vitamin D . Last vitamin D was low (goal 70-100) :  Lab Results  Component Value Date   VD25OH 44 06/18/2022       Current Outpatient Medications on File Prior to Visit  Medication Sig   clopidogrel  75 MG tablet TAKE ONE TABLET  DAILY    ezetimibe 10 MG tablet Take  1 tablet  Daily     gabapentin  600 MG tablet Take  1/2 to 1 tablet  3 x /day  as needed for Pain   Nebivolol  10 MG tablet TAKE ONE TABLET  DAILY   NITROSTAT 0.4 MG SL tablet if needed for Angina   rosuvastatin  20 MG tablet Take  1 tablet Daily f   tadalafil  20 MG tablet Take  1/2-1 tablet   every 2-3 days   as needed    telmisartan  80 MG tablet Take  1 tablet  Daily  for BP   zolpidem (AMBIEN) 5 MG tablet TAKE ONE TAB AS NEEDED FOR SLEEP ( per Dr Vickey Huger)      Allergies  Allergen Reactions   Lisinopril Cough    ACEi cough   Amoxicillin Hives and Swelling   Penicillins Swelling     PMHx:   Past Medical History:  Diagnosis Date   Allergy    CAD (coronary artery disease), native coronary artery    Cath 04/19/2001  normal Left main, moderate proximal disease, 80% stenosis distal LAD, 99% stenosis proximal OM 1, 60% stenosis mid RCA, 70% stenosis ostial mid PDA  PCI with bare metal stent of OM1 same date 3.5 x 18 mm Multilink     Cardiac arrhythmia due to congenital heart disease    Colon polyps    Diverticulosis    GERD (gastroesophageal reflux disease)    Hernia    History of PSVT    Ablation done in 2004 at [facility]    Hyperlipidemia    Hypertension    Sleep apnea    Symptomatic PVCs    Vitamin D deficiency       Immunization History  Administered Date(s) Administered    Influenza Split 10/13/2013, 10/23/2014   Influenza, High Dose Seasonal PF 01/09/2017, 10/27/2017, 11/21/2019, 09/27/2020, 09/30/2021   Influenza,inj,quad, With Preservative 01/13/2013   Influenza-Unspecified 10/07/2011   PPD Test 10/13/2013, 10/23/2014   Pneumococcal Conjugate-13 10/13/2013   Pneumococcal Polysaccharide-23 10/23/2014, 02/21/2020   Td 01/06/2006   Tdap 03/06/2016   Zoster, Live 09/22/2011      Past Surgical History:  Procedure Laterality Date   ACHILLES TENDON REPAIR     left   APPENDECTOMY     CARDIAC ELECTROPHYSIOLOGY MAPPING AND ABLATION     COLONOSCOPY     CORONARY ANGIOPLASTY WITH STENT PLACEMENT     years ago     FHx:    Reviewed / unchanged   SHx:    Reviewed / unchanged    Systems Review:  Constitutional: Denies fever, chills, wt changes, headaches, insomnia, fatigue, night sweats, change in appetite. Eyes: Denies redness, blurred vision, diplopia, discharge, itchy, watery eyes.  ENT: Denies discharge, congestion, post nasal drip, epistaxis, sore throat, earache, hearing loss, dental pain, tinnitus, vertigo, sinus pain, snoring.  CV: Denies chest pain, palpitations, irregular heartbeat, syncope, dyspnea, diaphoresis, orthopnea, PND, claudication or edema. Respiratory: denies cough, dyspnea, DOE, pleurisy, hoarseness, laryngitis, wheezing.  Gastrointestinal: Denies dysphagia, odynophagia, heartburn, reflux, water brash, abdominal pain or cramps, nausea, vomiting, bloating, diarrhea, constipation, hematemesis, melena, hematochezia  or hemorrhoids. Genitourinary: Denies dysuria, frequency, urgency, nocturia, hesitancy, discharge, hematuria or flank pain. Musculoskeletal: Denies arthralgias, myalgias, stiffness, jt. swelling, pain, limping or strain/sprain.  Skin: Denies pruritus, rash, hives, warts, acne, eczema or change in skin lesion(s). Neuro: No weakness, tremor, incoordination, spasms, paresthesia or pain. Psychiatric: Denies confusion, memory  loss or sensory loss. Endo: Denies change in weight, skin or hair change.  Heme/Lymph: No excessive bleeding, bruising or enlarged lymph nodes.   Physical Exam  BP 104/68  Pulse 74   Temp 97.9 F (36.6 C)   Resp 16   Ht 5\' 11"  (1.803 m)   Wt 214 lb 9.6 oz (97.3 kg)   SpO2 98%   BMI 29.93 kg/m   Appears  well nourished, well groomed  and in no distress.  Eyes: PERRLA, EOMs, conjunctiva no swelling or erythema. Sinuses: No frontal/maxillary tenderness ENT/Mouth: EAC's clear, TM's nl w/o erythema, bulging. Nares clear w/o erythema, swelling, exudates. Oropharynx clear without erythema or exudates. Oral hygiene is good. Tongue normal, non obstructing. Hearing intact.  Neck: Supple. Thyroid not palpable. Car 2+/2+ without bruits, nodes or JVD. Chest: Respirations nl with BS clear & equal w/o rales, rhonchi, wheezing or stridor.  Cor: Heart sounds normal w/ regular rate and rhythm without sig. murmurs, gallops, clicks or rubs. Peripheral pulses normal and equal  without edema.  Abdomen: Soft & bowel sounds normal. Non-tender w/o guarding, rebound, hernias, masses or organomegaly.  Lymphatics: Unremarkable.  Musculoskeletal: Full ROM all peripheral extremities, joint stability, 5/5 strength and normal gait.  Skin: Warm, dry without exposed rashes, lesions or ecchymosis apparent.  Neuro: Cranial nerves intact, reflexes equal bilaterally. Sensory-motor testing grossly intact. Tendon reflexes grossly intact.  Pysch: Alert & oriented x 3.  Insight and judgement nl & appropriate. No ideations.   Assessment and Plan:  1. Essential hypertension  - Continue medication, monitor blood pressure at home.  - Continue DASH diet.  Reminder to go to the ER if any CP,  SOB, nausea, dizziness, severe HA, changes vision/speech.   - CBC with Differential/Platelet - COMPLETE METABOLIC PANEL WITH GFR - Magnesium - TSH   2. Hyperlipidemia, mixed  - Continue diet/meds, exercise,& lifestyle  modifications.  - Continue monitor periodic cholesterol/liver & renal functions    - Lipid panel - TSH   3. Abnormal glucose  - Continue diet, exercise  - Lifestyle modifications.  - Monitor appropriate labs    - Hemoglobin A1c - Insulin, random   4. Vitamin D deficiency  - Continue supplementation   - VITAMIN D 25 Hydroxy    5. Aorto-iliac atherosclerosis (HCC) by Pelvic CT scan 03/2016  - Lipid panel   6. Coronary artery disease involving native coronary                                               artery of native heart without angina pectoris  - Lipid panel   7. Medication management  - CBC with Differential/Platelet - COMPLETE METABOLIC PANEL WITH GFR - Magnesium - Lipid panel - TSH - Hemoglobin A1c - Insulin, random - VITAMIN D 25 Hydroxy          Discussed  regular exercise, BP monitoring, weight control to achieve/maintain BMI less than 25 and discussed med and SE's. Recommended labs to assess /monitor clinical status .  I discussed the assessment and treatment plan with the patient. The patient was provided an opportunity to ask questions and all were answered. The patient agreed with the plan and demonstrated an understanding of the instructions.  I provided over 30 minutes of exam, counseling, chart review and  complex critical decision making.        The patient was advised to call back or seek an in-person evaluation if the symptoms worsen or if the condition fails to improve as anticipated.   Marinus Maw, MD

## 2022-09-18 ENCOUNTER — Encounter: Payer: Self-pay | Admitting: Internal Medicine

## 2022-09-18 ENCOUNTER — Ambulatory Visit (INDEPENDENT_AMBULATORY_CARE_PROVIDER_SITE_OTHER): Payer: PPO | Admitting: Internal Medicine

## 2022-09-18 VITALS — BP 104/68 | HR 74 | Temp 97.9°F | Resp 16 | Ht 71.0 in | Wt 214.6 lb

## 2022-09-18 DIAGNOSIS — R7309 Other abnormal glucose: Secondary | ICD-10-CM

## 2022-09-18 DIAGNOSIS — F5104 Psychophysiologic insomnia: Secondary | ICD-10-CM | POA: Diagnosis not present

## 2022-09-18 DIAGNOSIS — I708 Atherosclerosis of other arteries: Secondary | ICD-10-CM | POA: Diagnosis not present

## 2022-09-18 DIAGNOSIS — E559 Vitamin D deficiency, unspecified: Secondary | ICD-10-CM | POA: Diagnosis not present

## 2022-09-18 DIAGNOSIS — I700 Atherosclerosis of aorta: Secondary | ICD-10-CM | POA: Diagnosis not present

## 2022-09-18 DIAGNOSIS — I2510 Atherosclerotic heart disease of native coronary artery without angina pectoris: Secondary | ICD-10-CM | POA: Diagnosis not present

## 2022-09-18 DIAGNOSIS — E782 Mixed hyperlipidemia: Secondary | ICD-10-CM | POA: Diagnosis not present

## 2022-09-18 DIAGNOSIS — Z79899 Other long term (current) drug therapy: Secondary | ICD-10-CM

## 2022-09-18 DIAGNOSIS — I10 Essential (primary) hypertension: Secondary | ICD-10-CM | POA: Diagnosis not present

## 2022-09-18 MED ORDER — TRAZODONE HCL 300 MG PO TABS
ORAL_TABLET | ORAL | 0 refills | Status: DC
Start: 2022-09-18 — End: 2022-10-15

## 2022-09-19 LAB — COMPLETE METABOLIC PANEL WITH GFR
AG Ratio: 1.8 (calc) (ref 1.0–2.5)
ALT: 45 U/L (ref 9–46)
AST: 27 U/L (ref 10–35)
Albumin: 4.5 g/dL (ref 3.6–5.1)
Alkaline phosphatase (APISO): 61 U/L (ref 35–144)
BUN: 17 mg/dL (ref 7–25)
CO2: 29 mmol/L (ref 20–32)
Calcium: 9.5 mg/dL (ref 8.6–10.3)
Chloride: 103 mmol/L (ref 98–110)
Creat: 1.05 mg/dL (ref 0.70–1.28)
Globulin: 2.5 g/dL (ref 1.9–3.7)
Glucose, Bld: 106 mg/dL — ABNORMAL HIGH (ref 65–99)
Potassium: 4.6 mmol/L (ref 3.5–5.3)
Sodium: 138 mmol/L (ref 135–146)
Total Bilirubin: 0.9 mg/dL (ref 0.2–1.2)
Total Protein: 7 g/dL (ref 6.1–8.1)
eGFR: 75 mL/min/{1.73_m2} (ref >=60)

## 2022-09-19 LAB — HEMOGLOBIN A1C
Hgb A1c MFr Bld: 5.8 %{Hb} — ABNORMAL HIGH (ref <5.7)
Mean Plasma Glucose: 120 mg/dL
eAG (mmol/L): 6.6 mmol/L

## 2022-09-19 LAB — VITAMIN D 25 HYDROXY (VIT D DEFICIENCY, FRACTURES): Vit D, 25-Hydroxy: 43 ng/mL (ref 30–100)

## 2022-09-19 LAB — CBC WITH DIFFERENTIAL/PLATELET
Absolute Monocytes: 489 {cells}/uL (ref 200–950)
Basophils Absolute: 38 {cells}/uL (ref 0–200)
Basophils Relative: 0.8 %
Eosinophils Absolute: 249 {cells}/uL (ref 15–500)
Eosinophils Relative: 5.3 %
HCT: 49.4 % (ref 38.5–50.0)
Hemoglobin: 16.6 g/dL (ref 13.2–17.1)
Lymphs Abs: 1575 {cells}/uL (ref 850–3900)
MCH: 29.6 pg (ref 27.0–33.0)
MCHC: 33.6 g/dL (ref 32.0–36.0)
MCV: 88.2 fL (ref 80.0–100.0)
MPV: 10.4 fL (ref 7.5–12.5)
Monocytes Relative: 10.4 %
Neutro Abs: 2350 {cells}/uL (ref 1500–7800)
Neutrophils Relative %: 50 %
Platelets: 214 10*3/uL (ref 140–400)
RBC: 5.6 10*6/uL (ref 4.20–5.80)
RDW: 11.8 % (ref 11.0–15.0)
Total Lymphocyte: 33.5 %
WBC: 4.7 10*3/uL (ref 3.8–10.8)

## 2022-09-19 LAB — LIPID PANEL
Cholesterol: 128 mg/dL (ref <200)
HDL: 49 mg/dL (ref >=40)
LDL Cholesterol (Calc): 54 mg/dL
Non-HDL Cholesterol (Calc): 79 mg/dL (ref <130)
Total CHOL/HDL Ratio: 2.6 (calc) (ref <5.0)
Triglycerides: 168 mg/dL — ABNORMAL HIGH (ref <150)

## 2022-09-19 LAB — MAGNESIUM: Magnesium: 2.1 mg/dL (ref 1.5–2.5)

## 2022-09-19 LAB — TSH: TSH: 2.41 m[IU]/L (ref 0.40–4.50)

## 2022-09-19 LAB — INSULIN, RANDOM: Insulin: 27.3 u[IU]/mL — ABNORMAL HIGH

## 2022-09-19 NOTE — Progress Notes (Signed)
<>*<>*<>*<>*<>*<>*<>*<>*<>*<>*<>*<>*<>*<>*<>*<>*<>*<>*<>*<>*<>*<>*<>*<>*<> <>*<>*<>*<>*<>*<>*<>*<>*<>*<>*<>*<>*<>*<>*<>*<>*<>*<>*<>*<>*<>*<>*<>*<>*<>  -Test results slightly outside the reference range are not unusual. If there is anything important, I will review this with you,  otherwise it is considered normal test values.  If you have further questions,  please do not hesitate to contact me at the office or via My Chart.   <>*<>*<>*<>*<>*<>*<>*<>*<>*<>*<>*<>*<>*<>*<>*<>*<>*<>*<>*<>*<>*<>*<>*<>*<> <>*<>*<>*<>*<>*<>*<>*<>*<>*<>*<>*<>*<>*<>*<>*<>*<>*<>*<>*<>*<>*<>*<>*<>*<>  -  Chol = 128   &   LDL = 54 --Both  Excellent   - Very low risk for Heart Attack  / Stroke  <>*<>*<>*<>*<>*<>*<>*<>*<>*<>*<>*<>*<>*<>*<>*<>*<>*<>*<>*<>*<>*<>*<>*<>*<> <>*<>*<>*<>*<>*<>*<>*<>*<>*<>*<>*<>*<>*<>*<>*<>*<>*<>*<>*<>*<>*<>*<>*<>*<>  -  A1c = 5.8% = 12 week average blood sugar &  are elevated in the borderline and                                                                   early or pre-diabetes range which has the same   300% increased risk for heart attack, stroke, cancer and                                                     alzheimer- type vascular dementia as full blown diabetes.   But the good news is that diet, exercise with                                                             weight loss can cure the early diabetes at this point.  <>*<>*<>*<>*<>*<>*<>*<>*<>*<>*<>*<>*<>*<>*<>*<>*<>*<>*<>*<>*<>*<>*<>*<>*<> <>*<>*<>*<>*<>*<>*<>*<>*<>*<>*<>*<>*<>*<>*<>*<>*<>*<>*<>*<>*<>*<>*<>*<>*<>  -  Vitamin D = 43  is very low   - Vitamin D goal is between 70-100.   - Please Restart taking  Vitamin D 5,000 units Every Day  - It is very important as a natural anti-inflammatory and helping the                           immune system protect against viral infections, like the Covid-19    helping hair, skin, and nails, as well as reducing stroke and heart attack risk.   - It helps your bones  and helps with mood.  - It also decreases numerous cancer risks so please                                                                                           take it as directed.   - Low Vit D is associated with a 200-300% higher risk for CANCER   and 200-300% higher risk for HEART   ATTACK  &  STROKE.    - It is also associated with higher death rate at younger ages,   autoimmune  diseases like Rheumatoid arthritis, Lupus, Multiple Sclerosis.     - Also many other serious conditions, like depression, Alzheimer's  Dementia,  muscle aches, fatigue, fibromyalgia   <>*<>*<>*<>*<>*<>*<>*<>*<>*<>*<>*<>*<>*<>*<>*<>*<>*<>*<>*<>*<>*<>*<>*<>*<> <>*<>*<>*<>*<>*<>*<>*<>*<>*<>*<>*<>*<>*<>*<>*<>*<>*<>*<>*<>*<>*<>*<>*<>*<>  -  All Else - CBC - Kidneys - Electrolytes - Liver - Magnesium & Thyroid    - all  Normal / OK  <>*<>*<>*<>*<>*<>*<>*<>*<>*<>*<>*<>*<>*<>*<>*<>*<>*<>*<>*<>*<>*<>*<>*<>*<> <>*<>*<>*<>*<>*<>*<>*<>*<>*<>*<>*<>*<>*<>*<>*<>*<>*<>*<>*<>*<>*<>*<>*<>*<>

## 2022-09-20 ENCOUNTER — Encounter: Payer: Self-pay | Admitting: Internal Medicine

## 2022-09-27 ENCOUNTER — Other Ambulatory Visit: Payer: Self-pay | Admitting: Internal Medicine

## 2022-09-27 DIAGNOSIS — N529 Male erectile dysfunction, unspecified: Secondary | ICD-10-CM

## 2022-09-27 MED ORDER — TADALAFIL 20 MG PO TABS
ORAL_TABLET | ORAL | 1 refills | Status: DC
Start: 2022-09-27 — End: 2023-04-27

## 2022-10-15 ENCOUNTER — Other Ambulatory Visit: Payer: Self-pay | Admitting: Internal Medicine

## 2022-10-15 DIAGNOSIS — F5104 Psychophysiologic insomnia: Secondary | ICD-10-CM

## 2022-10-15 MED ORDER — TRAZODONE HCL 300 MG PO TABS
ORAL_TABLET | ORAL | 0 refills | Status: DC
Start: 2022-10-15 — End: 2023-01-22

## 2022-11-02 ENCOUNTER — Other Ambulatory Visit: Payer: Self-pay | Admitting: Nurse Practitioner

## 2022-11-02 DIAGNOSIS — M48061 Spinal stenosis, lumbar region without neurogenic claudication: Secondary | ICD-10-CM

## 2022-11-02 DIAGNOSIS — M5410 Radiculopathy, site unspecified: Secondary | ICD-10-CM

## 2022-12-16 ENCOUNTER — Other Ambulatory Visit: Payer: Self-pay | Admitting: Nurse Practitioner

## 2022-12-16 DIAGNOSIS — J0410 Acute tracheitis without obstruction: Secondary | ICD-10-CM

## 2022-12-16 MED ORDER — IPRATROPIUM-ALBUTEROL 0.5-2.5 (3) MG/3ML IN SOLN: 3.0000 mL | RESPIRATORY_TRACT | 0 refills | Status: DC | PRN

## 2022-12-18 ENCOUNTER — Ambulatory Visit: Payer: PPO | Admitting: Nurse Practitioner

## 2022-12-29 ENCOUNTER — Encounter: Payer: Self-pay | Admitting: Nurse Practitioner

## 2022-12-29 ENCOUNTER — Ambulatory Visit: Payer: PPO | Admitting: Nurse Practitioner

## 2022-12-29 VITALS — BP 117/73 | HR 73 | Temp 98.7°F

## 2022-12-29 DIAGNOSIS — U071 COVID-19: Secondary | ICD-10-CM

## 2022-12-29 DIAGNOSIS — J069 Acute upper respiratory infection, unspecified: Secondary | ICD-10-CM

## 2022-12-29 MED ORDER — DEXAMETHASONE 4 MG PO TABS: ORAL_TABLET | ORAL | 0 refills | Status: DC

## 2022-12-29 MED ORDER — PROMETHAZINE-DM 6.25-15 MG/5ML PO SYRP: 5.0000 mL | ORAL_SOLUTION | Freq: Four times a day (QID) | ORAL | 1 refills | Status: DC | PRN

## 2022-12-29 MED ORDER — AZITHROMYCIN 250 MG PO TABS: ORAL_TABLET | ORAL | 1 refills | Status: DC

## 2022-12-29 NOTE — Progress Notes (Addendum)
THIS ENCOUNTER IS A VIRTUAL VISIT DUE TO COVID-19 - PATIENT WAS NOT SEEN IN THE OFFICE.  PATIENT HAS CONSENTED TO VIRTUAL VISIT / TELEMEDICINE VISIT   Virtual Visit via telephone Note  I connected with  Bryan [name] on 12/29/2022 by telephone.  I verified that I am speaking with the correct person using two identifiers.    I discussed the limitations of evaluation and management by telemedicine and the availability of in person appointments. The patient expressed understanding and agreed to proceed.  History of Present Illness:  BP 117/73   Pulse 73   Temp 98.7 F (37.1 C)   SpO2 97%  72 y.o. patient contacted office reporting URI sx . he tested positive by home test yesterday. OV was conducted by telephone to minimize exposure. This patient was not vaccinated for covid 19    Sx began 5 days ago with productive cough of yellow mucus, wheezing, sinus congestion, fatigue  Treatments tried so far: duoneb  Exposures: unknown   Medications  Current Outpatient Medications (Endocrine & Metabolic):    dexamethasone (DECADRON) 4 MG tablet, Take 3 tabs for 3 days, 2 tabs for 3 days 1 tab for 5 days. Take with food.  Current Outpatient Medications (Cardiovascular):    ezetimibe (ZETIA) 10 MG tablet, Take  1 tablet  Daily  for Cholesterol   nebivolol (BYSTOLIC) 10 MG tablet, TAKE ONE TABLET BY MOUTH DAILY FOR BLOOD PRESSURE   nitroGLYCERIN (NITROSTAT) 0.4 MG SL tablet, Dissolve 1 tablet under tongue every 3 to 5 minutes if needed for Angina   rosuvastatin (CRESTOR) 20 MG tablet, Take  1 tablet Daily for  Cholesterol   tadalafil (CIALIS) 20 MG tablet, Take  1/2 to 1 tablet   every 2 to 3 days   as needed for  XXXX   telmisartan (MICARDIS) 80 MG tablet, Take  1 tablet  Daily  for BP  Current Outpatient Medications (Respiratory):    promethazine-dextromethorphan (PROMETHAZINE-DM) 6.25-15 MG/5ML syrup, Take 5 mLs by mouth 4 (four) times daily as needed for cough.   ipratropium-albuterol  (DUONEB) 0.5-2.5 (3) MG/3ML SOLN, Take 3 mLs by nebulization every 4 (four) hours as needed. Max:6 doses per day   Current Outpatient Medications (Hematological):    clopidogrel (PLAVIX) 75 MG tablet, TAKE ONE TABLET BY MOUTH DAILY TO PREVENT BLOOD CLOTS  Current Outpatient Medications (Other):    azithromycin (ZITHROMAX) 250 MG tablet, Take 2 tablets (500 mg) on  Day 1,  followed by 1 tablet (250 mg) once daily on Days 2 through 5.   gabapentin (NEURONTIN) 600 MG tablet, TAKE 1/2 TO 1 TABLET BY MOUTH 3 TIMES A DAY AS NEEDED FOR PAIN   trazodone (DESYREL) 300 MG tablet, Take  1 to 2 tablets (600 mg)  1 to 2 hours before Bedtime   as needed for Sleep   zolpidem (AMBIEN) 5 MG tablet, TAKE ONE TABLET BY MOUTH EVERY EVENING AS NEEDED FOR SLEEP  Allergies:  Allergies  Allergen Reactions   Lisinopril Cough    ACEi cough   Amoxicillin Hives and Swelling   Penicillins Swelling    Problem list He has Hyperlipidemia, mixed; Essential hypertension; Vitamin D deficiency; Abnormal glucose; Antiplatelet or antithrombotic long-term use; Coronary artery disease involving native coronary artery of native heart without angina pectoris; History of PSVT; Symptomatic PVCs; CKD (chronic kidney disease) stage 2, GFR 60-89 ml/min; Obesity (BMI 30.0-34.9); Insomnia; ED (erectile dysfunction); Aorto-iliac atherosclerosis (HCC) by Pelvic CT scan 03/2016; Psychophysiological insomnia; Dental anomaly; Severe obstructive sleep  apnea-hypopnea syndrome; OSA on CPAP; Moderate obstructive sleep apnea-hypopnea syndrome; Intolerance of continuous positive airway pressure (CPAP) ventilation; Short sleeper; Dependence on CPAP ventilation; and History of colonic polyps on their problem list.   Social History:   reports that he has never smoked. He has never used smokeless tobacco. He reports current alcohol use. He reports that he does not use drugs.  Observations/Objective:  General : Well sounding patient in no apparent  distress HEENT: no hoarseness, no cough for duration of visit Lungs: speaks in complete sentences, no audible wheezing, no apparent distress Neurological: alert, oriented x 3 Psychiatric: pleasant, judgement appropriate   Assessment and Plan:  Covid 19 Covid 19 positive per rapid screening test yesterday at home Risk factors include: has Hyperlipidemia, mixed; Essential hypertension; Vitamin D deficiency; Abnormal glucose; Antiplatelet or antithrombotic long-term use; Coronary artery disease involving native coronary artery of native heart without angina pectoris; History of PSVT; Symptomatic PVCs; CKD (chronic kidney disease) stage 2, GFR 60-89 ml/min; Obesity (BMI 30.0-34.9); Insomnia; ED (erectile dysfunction); Aorto-iliac atherosclerosis (HCC) by Pelvic CT scan 03/2016; Psychophysiological insomnia; Dental anomaly; Severe obstructive sleep apnea-hypopnea syndrome; OSA on CPAP; Moderate obstructive sleep apnea-hypopnea syndrome; Intolerance of continuous positive airway pressure (CPAP) ventilation; Short sleeper; Dependence on CPAP ventilation; and History of colonic polyps on their problem list.  Symptoms are: mild Immue support reviewed : Vit C, Vit D and Zinc Take tylenol PRN temp 101+ Push hydration Regular ambulation or calf exercises exercises for clot prevention and 81 mg ASA unless contraindicated Sx supportive therapy suggested Follow up via mychart or telephone if needed Advised patient obtain O2 monitor; present to ED if persistently <90% or with severe dyspnea, CP, fever uncontrolled by tylenol, confusion, sudden decline       Should remain in isolation 5 days from testing positive and then wear a mask when around other people for the following 5 days Diagnoses and all orders for this visit:  COVID -     azithromycin (ZITHROMAX) 250 MG tablet; Take 2 tablets (500 mg) on  Day 1,  followed by 1 tablet (250 mg) once daily on Days 2 through 5. -     dexamethasone (DECADRON) 4 MG  tablet; Take 3 tabs for 3 days, 2 tabs for 3 days 1 tab for 5 days. Take with food. -     promethazine-dextromethorphan (PROMETHAZINE-DM) 6.25-15 MG/5ML syrup; Take 5 mLs by mouth 4 (four) times daily as needed for cough.  URI, acute -     azithromycin (ZITHROMAX) 250 MG tablet; Take 2 tablets (500 mg) on  Day 1,  followed by 1 tablet (250 mg) once daily on Days 2 through 5.     Follow Up Instructions:  I discussed the assessment and treatment plan with the patient. The patient was provided an opportunity to ask questions and all were answered. The patient agreed with the plan and demonstrated an understanding of the instructions.   The patient was advised to call back or seek an in-person evaluation if the symptoms worsen or if the condition fails to improve as anticipated.  I provided 15 minutes of non-face-to-face time during this encounter.   Raynelle Dick, NP

## 2022-12-29 NOTE — Patient Instructions (Signed)
  COVID  Immue support reviewed : Vit C, Vit D and Zinc Take tylenol PRN temp 101+ Push hydration Regular ambulation or calf exercises exercises for clot prevention and 81 mg ASA unless contraindicated Sx supportive therapy suggested Follow up via mychart or telephone if needed Advised patient obtain O2 monitor; present to ED if persistently <90% or with severe dyspnea, CP, fever uncontrolled by tylenol, confusion, sudden decline       Should remain in isolation 5 days from testing positive and then wear a mask when around other people for the following 5 days Diagnoses and all orders for this visit: -     azithromycin (ZITHROMAX) 250 MG tablet; Take 2 tablets (500 mg) on  Day 1,  followed by 1 tablet (250 mg) once daily on Days 2 through 5. -     dexamethasone (DECADRON) 4 MG tablet; Take 3 tabs for 3 days, 2 tabs for 3 days 1 tab for 5 days. Take with food. -     promethazine-dextromethorphan (PROMETHAZINE-DM) 6.25-15 MG/5ML syrup; Take 5 mLs by mouth 4 (four) times daily as needed for cough.  URI, acute -     azithromycin (ZITHROMAX) 250 MG tablet; Take 2 tablets (500 mg) on  Day 1,  followed by 1 tablet (250 mg) once daily on Days 2 through 5.

## 2023-01-05 ENCOUNTER — Encounter: Payer: Self-pay | Admitting: Nurse Practitioner

## 2023-01-05 ENCOUNTER — Other Ambulatory Visit: Payer: Self-pay | Admitting: Nurse Practitioner

## 2023-01-05 DIAGNOSIS — B370 Candidal stomatitis: Secondary | ICD-10-CM

## 2023-01-05 DIAGNOSIS — B001 Herpesviral vesicular dermatitis: Secondary | ICD-10-CM

## 2023-01-05 MED ORDER — VALACYCLOVIR HCL 500 MG PO TABS: ORAL_TABLET | ORAL | 1 refills | Status: AC

## 2023-01-05 MED ORDER — FLUCONAZOLE 100 MG PO TABS: ORAL_TABLET | ORAL | 1 refills | Status: DC

## 2023-01-14 ENCOUNTER — Other Ambulatory Visit: Payer: Self-pay | Admitting: Nurse Practitioner

## 2023-01-14 ENCOUNTER — Ambulatory Visit: Payer: PPO | Admitting: Nurse Practitioner

## 2023-01-14 DIAGNOSIS — J0410 Acute tracheitis without obstruction: Secondary | ICD-10-CM

## 2023-01-22 ENCOUNTER — Other Ambulatory Visit: Payer: Self-pay | Admitting: Internal Medicine

## 2023-01-22 DIAGNOSIS — U071 COVID-19: Secondary | ICD-10-CM

## 2023-01-22 DIAGNOSIS — J069 Acute upper respiratory infection, unspecified: Secondary | ICD-10-CM

## 2023-01-22 DIAGNOSIS — F5104 Psychophysiologic insomnia: Secondary | ICD-10-CM

## 2023-01-22 MED ORDER — TRAZODONE HCL 300 MG PO TABS: ORAL_TABLET | ORAL | 0 refills | Status: DC

## 2023-01-22 NOTE — Progress Notes (Signed)
FOLLOW UP Assessment:    Aortoiliac atherosclerosis (HCC) Per CT 2018 Control blood pressure, cholesterol, glucose, increase exercise.   Symptomatic PVCs Has very rarely; symptoms stable recently.  Continue with medications, followed by cardiology.   Essential hypertension BPs at goal on Telmisartan 80 mg every day and Nebivolol 10 mg every day  Monitor blood pressure at home; call if consistently over 130/80 Continue DASH diet.   Reminder to go to the ER if any CP, SOB, nausea, dizziness, severe HA, changes vision/speech, left arm numbness and tingling and jaw pain. CBC  History of PSVT Followed by cardiology  Coronary artery disease involving native coronary artery of native heart, angina presence unspecified Control blood pressure, cholesterol, glucose, increase exercise.  Followed by cardiology  OSA (obstructive sleep apnea) Followed by Dr. Vickey Huger; recent sleep study recommended CPAP, pending titration  CKD (chronic kidney disease) stage 2, GFR 60-89 ml/min Increase fluids, avoid NSAIDS, monitor sugars, will monitor Microalbumin/creatinine urine ratio  Vitamin D deficiency Continue supplementation for goal of 70-100 Defer vitamin D level  Other abnormal glucose Recent A1Cs at goal Discussed diet/exercise, weight management  Defer A1C; check CMP  Obesity Long discussion about weight loss, diet, and exercise Recommended diet heavy in fruits and veggies and low in animal meats, cheeses, and dairy products, appropriate calorie intake Will work on decreasing saturated fats and simple carbs. Increase exercise Follow up at next visit  Medication management CBC, CMP/GFR  Primary insomnia Followed by Dr. Vickey Huger, prescribed Bennetta Laos sleep hygiene discussed, increase day time activity Continue with careful use of sleep aid agents  Mixed hyperlipidemia Triglycerides remain signicantly elevated; recently increased rosuvastatin to 20 mg, titrate up as needed,  reduce creamer and sugar in coffee Continue statin/zetia Continue low cholesterol diet and exercise.  Check lipid panel.   Erectile dysfunction Continue cialis PRN   Medication management -     CBC with Differential/Platelet -     COMPLETE METABOLIC PANEL WITH GFR -     Magnesium -     Lipid panel   History of MI (myocardial infarction) Also has obesity Will try to get wegovy approved to help with weight loss and decrease risk of future MI or CVA -     Semaglutide-Weight Management (WEGOVY) 0.5 MG/0.5ML SOAJ; Inject 0.5 mg into the skin once a week.  BPH with obstruction/lower urinary tract symptoms -     PSA -     Testosterone  Hypogonadism in male -     Testosterone     Over 30 minutes of exam, counseling, chart review, and critical decision making was performed  Future Appointments  Date Time Provider Department Center  02/26/2023  3:30 PM Dohmeier, Porfirio Mylar, MD [facility] None  03/18/2023 10:00 AM Lucky Cowboy, MD [facility] None  06/19/2023 10:30 AM Raynelle Dick, NP [facility] None     Subjective:  Bryan [name] is a 73 y.o. male who presents for Medicare Annual Wellness Visit and 3 month follow up for HTN, hyperlipidemia, glucose management, and vitamin D Def.   He has ongoing difficulties with sleep,  Dr. Vickey Huger had sleep study which showed severe OSA and recommended CPAP.He can not tolerate the mask of CPAP and is not using.  She is prescribing ambien.  He reports back and hip pain continues to have intermittently , gabapenitn does help. It is currently improved with use of Gabapentin.    BMI is Body mass index is 31.02 kg/m., he has been working on diet and exercise. Doing  stretching exercises, walking, calisthenics, redoing yard, does a lot of outdoor activities, boating, etc. Works out 3 days a week with weight and walks a couple miles everyday Wt Readings from Last 3 Encounters:  01/23/23 222 lb 6.4 oz (100.9 kg)  09/18/22 214 lb 9.6 oz (97.3 kg)   06/18/22 217 lb (98.4 kg)   He has hx of PSVT/PVCs but recently improved without episodes in several years.  Patient has hx/o ASCAD with ACS/acute Anterior NSTEMI and PCA w/Stent implantation in 2003. He is on plavix. In 2004 , he had an Ablation for pAfib. Stress Myoview was negative in 2010. Continues on plavix. Recently transitioned to seeing Dr. Excell Seltzer.   His blood pressure has been controlled at home on Telmisartan 80 mg every day and Nebivolol 10 mg every day , today their BP is BP: 122/70 BP Readings from Last 3 Encounters:  01/23/23 122/70  12/29/22 117/73  09/18/22 104/68  He does workout. He denies chest pain, shortness of breath, dizziness.   He is on cholesterol medication (zetia 10 mg daily, rosuvastatin 20 g, omega 3) and denies myalgias. His cholesterol is at goal. The cholesterol last visit was:    Lab Results  Component Value Date   CHOL 128 09/18/2022   HDL 49 09/18/2022   LDLCALC 54 09/18/2022   LDLDIRECT 59 11/07/2019   TRIG 168 (H) 09/18/2022   CHOLHDL 2.6 09/18/2022   He has been working on diet and exercise for glucose management, and denies foot ulcerations, increased appetite, nausea, paresthesia of the feet, polydipsia, polyuria, visual disturbances, vomiting and weight loss. Last A1C in the office was:  Lab Results  Component Value Date   HGBA1C 5.8 (H) 09/18/2022   He does drink a lot of fluids. Last GFR Lab Results  Component Value Date   EGFR 75 09/18/2022     Patient is on Vitamin D supplement and near 60:    Lab Results  Component Value Date   VD25OH 43 09/18/2022        Current Outpatient Medications on File Prior to Visit  Medication Sig Dispense Refill   clopidogrel (PLAVIX) 75 MG tablet TAKE ONE TABLET BY MOUTH DAILY TO PREVENT BLOOD CLOTS 90 tablet 3   ezetimibe (ZETIA) 10 MG tablet Take  1 tablet  Daily  for Cholesterol 90 tablet 3   gabapentin (NEURONTIN) 600 MG tablet TAKE 1/2 TO 1 TABLET BY MOUTH 3 TIMES A DAY AS NEEDED FOR PAIN  270 tablet 0   ipratropium-albuterol (DUONEB) 0.5-2.5 (3) MG/3ML SOLN INHALE 3 MILLILITERS VIA NEBULIZATION BY MOUTH EVERY 4 HOURS AS NEEDED **MAX 6 DOSES PER DAY 540 mL 0   nebivolol (BYSTOLIC) 10 MG tablet TAKE ONE TABLET BY MOUTH DAILY FOR BLOOD PRESSURE 90 tablet 3   nitroGLYCERIN (NITROSTAT) 0.4 MG SL tablet Dissolve 1 tablet under tongue every 3 to 5 minutes if needed for Angina 50 tablet 11   rosuvastatin (CRESTOR) 20 MG tablet Take  1 tablet Daily for  Cholesterol 90 tablet 3   tadalafil (CIALIS) 20 MG tablet Take  1/2 to 1 tablet   every 2 to 3 days   as needed for  XXXX 30 tablet 1   telmisartan (MICARDIS) 80 MG tablet Take  1 tablet  Daily  for BP 90 tablet 3   trazodone (DESYREL) 300 MG tablet Take  1 to 2 tablets (600 mg)  1 to 2 hours before Bedtime   as needed for Sleep 60 tablet 0   valACYclovir (VALTREX) 500  MG tablet Take 1 tablet 2 x /day for Fever Blisters / Cold Sores 180 tablet 1   zolpidem (AMBIEN) 5 MG tablet TAKE ONE TABLET BY MOUTH EVERY EVENING AS NEEDED FOR SLEEP 30 tablet 5   fluconazole (DIFLUCAN) 100 MG tablet Take  1 tablet  Daily  for Thrush Mouth (Patient not taking: Reported on 01/23/2023) 14 tablet 1   No current facility-administered medications on file prior to visit.     Allergies: Allergies  Allergen Reactions   Lisinopril Cough    ACEi cough   Amoxicillin Hives and Swelling   Penicillins Swelling    Current Problems (verified) has Hyperlipidemia, mixed; Essential hypertension; Vitamin D deficiency; Abnormal glucose; Antiplatelet or antithrombotic long-term use; Coronary artery disease involving native coronary artery of native heart without angina pectoris; History of PSVT; Symptomatic PVCs; CKD (chronic kidney disease) stage 2, GFR 60-89 ml/min; Obesity (BMI 30.0-34.9); Insomnia; ED (erectile dysfunction); Aorto-iliac atherosclerosis (HCC) by Pelvic CT scan 03/2016; Psychophysiological insomnia; Dental anomaly; Severe obstructive sleep apnea-hypopnea  syndrome; OSA on CPAP; Moderate obstructive sleep apnea-hypopnea syndrome; Intolerance of continuous positive airway pressure (CPAP) ventilation; Short sleeper; Dependence on CPAP ventilation; and History of colonic polyps on their problem list.  Screening Tests Immunization History  Administered Date(s) Administered   Influenza Split 10/13/2013, 10/23/2014   Influenza, High Dose Seasonal PF 01/09/2017, 10/27/2017, 11/21/2019, 09/27/2020, 09/30/2021   Influenza,inj,quad, With Preservative 01/13/2013   Influenza-Unspecified 10/07/2011   PPD Test 10/13/2013, 10/23/2014   Pneumococcal Conjugate-13 10/13/2013   Pneumococcal Polysaccharide-23 10/23/2014, 02/21/2020   Td 01/06/2006   Tdap 03/06/2016   Zoster, Live 09/22/2011    Health Maintenance  Topic Date Due   COVID-19 Vaccine (1) Never done   Zoster Vaccines- Shingrix (1 of 2) 05/30/1969   INFLUENZA VACCINE  08/07/2022   Medicare Annual Wellness (AWV)  06/18/2023   DTaP/Tdap/Td (3 - Td or Tdap) 03/07/2026   Colonoscopy  09/06/2026   Pneumonia Vaccine 31+ Years old  Completed   Hepatitis C Screening  Completed   HPV VACCINES  Aged Out     Names of Other Physician/Practitioners you currently use: 1. [facility] here for primary care 2. Lens, eye doctor, last visit 05/2021 3. Turner, dentist, last visit 09/04/21  Patient Care Team: Lucky Cowboy, MD as PCP - General (Internal Medicine) Tonny Bollman, MD as PCP - Cardiology (Cardiology) Charlett Nose, [facility] (Inactive) as Pharmacist (Pharmacist)  Surgical: He  has a past surgical history that includes Appendectomy; Achilles tendon repair; Cardiac electrophysiology mapping and ablation; Coronary angioplasty with stent; and Colonoscopy. Family His family history includes Brain cancer in his father; Celiac disease in his brother; Colon polyps in his mother; Colonic polyp in his mother; Heart disease in his maternal grandfather;  Hypothyroidism in his mother; Irritable bowel syndrome in his mother. Social history  He reports that he has never smoked. He has never used smokeless tobacco. He reports current alcohol use. He reports that he does not use drugs.     Review of Systems  Constitutional:  Negative for chills, fever and weight loss.  HENT:  Negative for congestion and hearing loss.   Eyes:  Negative for blurred vision and double vision.  Respiratory:  Negative for cough and shortness of breath.   Cardiovascular:  Negative for chest pain, palpitations, orthopnea and leg swelling.  Gastrointestinal:  Negative for abdominal pain, constipation, diarrhea, heartburn, nausea and vomiting.  Musculoskeletal:  Negative for falls, joint pain and myalgias.  Skin:  Negative for rash.  Neurological:  Negative for dizziness, tingling, tremors, loss of consciousness and headaches.  Psychiatric/Behavioral:  Negative for depression, memory loss and suicidal ideas.      Objective:   Today's Vitals   01/23/23 0938  BP: 122/70  Pulse: 78  Temp: 97.7 F (36.5 C)  SpO2: 97%  Weight: 222 lb 6.4 oz (100.9 kg)  Height: 5\' 11"  (1.803 m)     Body mass index is 31.02 kg/m.  General Appearance: Very pleasant obese male, in no apparent distress. Eyes: PERRLA, EOMs, conjunctiva no swelling or erythema Sinuses: No Frontal/maxillary tenderness ENT/Mouth: Ext aud canals clear, TMs without erythema, bulging. No erythema, swelling, or exudate on post pharynx.  Tonsils not swollen or erythematous. Hearing normal.  Neck: Supple, thyroid normal.  Respiratory: Respiratory effort normal, BS equal bilaterally without rales, rhonchi, wheezing or stridor.  Cardio: RRR with no MRGs. Brisk peripheral pulses without edema.  Abdomen: Soft, + BS.  Non tender, no guarding, rebound, hernias, masses. Lymphatics: Non tender without lymphadenopathy.  Musculoskeletal: Full ROM, 5/5 strength, Normal gait.  Skin: Warm, dry without rashes,  lesions, ecchymosis.  Neuro: Cranial nerves intact. No cerebellar symptoms.  Psych: Awake and oriented X 3, normal affect, Insight and Judgment appropriate.      Revonda Humphrey ANP-C  [facility]  01/23/2023

## 2023-01-23 ENCOUNTER — Encounter: Payer: Self-pay | Admitting: Nurse Practitioner

## 2023-01-23 ENCOUNTER — Ambulatory Visit (INDEPENDENT_AMBULATORY_CARE_PROVIDER_SITE_OTHER): Payer: PPO | Admitting: Nurse Practitioner

## 2023-01-23 ENCOUNTER — Telehealth: Payer: Self-pay

## 2023-01-23 VITALS — BP 122/70 | HR 78 | Temp 97.7°F | Ht 71.0 in | Wt 222.4 lb

## 2023-01-23 DIAGNOSIS — E66811 Obesity, class 1: Secondary | ICD-10-CM

## 2023-01-23 DIAGNOSIS — R7309 Other abnormal glucose: Secondary | ICD-10-CM | POA: Diagnosis not present

## 2023-01-23 DIAGNOSIS — I10 Essential (primary) hypertension: Secondary | ICD-10-CM | POA: Diagnosis not present

## 2023-01-23 DIAGNOSIS — N529 Male erectile dysfunction, unspecified: Secondary | ICD-10-CM

## 2023-01-23 DIAGNOSIS — E559 Vitamin D deficiency, unspecified: Secondary | ICD-10-CM

## 2023-01-23 DIAGNOSIS — N182 Chronic kidney disease, stage 2 (mild): Secondary | ICD-10-CM

## 2023-01-23 DIAGNOSIS — E291 Testicular hypofunction: Secondary | ICD-10-CM

## 2023-01-23 DIAGNOSIS — I708 Atherosclerosis of other arteries: Secondary | ICD-10-CM

## 2023-01-23 DIAGNOSIS — E663 Overweight: Secondary | ICD-10-CM

## 2023-01-23 DIAGNOSIS — E782 Mixed hyperlipidemia: Secondary | ICD-10-CM | POA: Diagnosis not present

## 2023-01-23 DIAGNOSIS — I493 Ventricular premature depolarization: Secondary | ICD-10-CM

## 2023-01-23 DIAGNOSIS — N401 Enlarged prostate with lower urinary tract symptoms: Secondary | ICD-10-CM

## 2023-01-23 DIAGNOSIS — I2510 Atherosclerotic heart disease of native coronary artery without angina pectoris: Secondary | ICD-10-CM

## 2023-01-23 DIAGNOSIS — I252 Old myocardial infarction: Secondary | ICD-10-CM

## 2023-01-23 DIAGNOSIS — N138 Other obstructive and reflux uropathy: Secondary | ICD-10-CM

## 2023-01-23 DIAGNOSIS — I471 Supraventricular tachycardia, unspecified: Secondary | ICD-10-CM

## 2023-01-23 DIAGNOSIS — F5101 Primary insomnia: Secondary | ICD-10-CM

## 2023-01-23 DIAGNOSIS — Z79899 Other long term (current) drug therapy: Secondary | ICD-10-CM

## 2023-01-23 DIAGNOSIS — G4733 Obstructive sleep apnea (adult) (pediatric): Secondary | ICD-10-CM

## 2023-01-23 DIAGNOSIS — I700 Atherosclerosis of aorta: Secondary | ICD-10-CM

## 2023-01-23 MED ORDER — WEGOVY 0.5 MG/0.5ML ~~LOC~~ SOAJ: 0.5000 mg | SUBCUTANEOUS | 2 refills | Status: DC

## 2023-01-23 NOTE — Telephone Encounter (Signed)
 Wegovy 0.5mg  prior auth completed and submitted.

## 2023-01-23 NOTE — Patient Instructions (Signed)

## 2023-01-24 ENCOUNTER — Encounter: Payer: Self-pay | Admitting: Nurse Practitioner

## 2023-01-24 LAB — COMPLETE METABOLIC PANEL WITH GFR
AG Ratio: 1.9 (calc) (ref 1.0–2.5)
ALT: 39 U/L (ref 9–46)
AST: 25 U/L (ref 10–35)
Albumin: 4.4 g/dL (ref 3.6–5.1)
Alkaline phosphatase (APISO): 62 U/L (ref 35–144)
BUN: 14 mg/dL (ref 7–25)
CO2: 26 mmol/L (ref 20–32)
Calcium: 9.9 mg/dL (ref 8.6–10.3)
Chloride: 104 mmol/L (ref 98–110)
Creat: 1.04 mg/dL (ref 0.70–1.28)
Globulin: 2.3 g/dL (ref 1.9–3.7)
Glucose, Bld: 127 mg/dL — ABNORMAL HIGH (ref 65–99)
Potassium: 4.7 mmol/L (ref 3.5–5.3)
Sodium: 139 mmol/L (ref 135–146)
Total Bilirubin: 0.6 mg/dL (ref 0.2–1.2)
Total Protein: 6.7 g/dL (ref 6.1–8.1)
eGFR: 76 mL/min/{1.73_m2} (ref >=60)

## 2023-01-24 LAB — LIPID PANEL
Cholesterol: 157 mg/dL (ref <200)
HDL: 52 mg/dL (ref >=40)
Non-HDL Cholesterol (Calc): 105 mg/dL (ref <130)
Total CHOL/HDL Ratio: 3 (calc) (ref <5.0)
Triglycerides: 465 mg/dL — ABNORMAL HIGH (ref <150)

## 2023-01-24 LAB — CBC WITH DIFFERENTIAL/PLATELET
Absolute Lymphocytes: 1830 {cells}/uL (ref 850–3900)
Absolute Monocytes: 555 {cells}/uL (ref 200–950)
Basophils Absolute: 31 {cells}/uL (ref 0–200)
Basophils Relative: 0.5 %
Eosinophils Absolute: 476 {cells}/uL (ref 15–500)
Eosinophils Relative: 7.8 %
HCT: 48.3 % (ref 38.5–50.0)
Hemoglobin: 16.1 g/dL (ref 13.2–17.1)
MCH: 29.9 pg (ref 27.0–33.0)
MCHC: 33.3 g/dL (ref 32.0–36.0)
MCV: 89.6 fL (ref 80.0–100.0)
MPV: 10.2 fL (ref 7.5–12.5)
Monocytes Relative: 9.1 %
Neutro Abs: 3209 {cells}/uL (ref 1500–7800)
Neutrophils Relative %: 52.6 %
Platelets: 225 10*3/uL (ref 140–400)
RBC: 5.39 10*6/uL (ref 4.20–5.80)
RDW: 12.2 % (ref 11.0–15.0)
Total Lymphocyte: 30 %
WBC: 6.1 10*3/uL (ref 3.8–10.8)

## 2023-01-24 LAB — PSA: PSA: 0.78 ng/mL (ref <=4.00)

## 2023-01-24 LAB — MAGNESIUM: Magnesium: 2.2 mg/dL (ref 1.5–2.5)

## 2023-01-24 LAB — TESTOSTERONE: Testosterone: 609 ng/dL (ref 250–827)

## 2023-01-26 NOTE — Telephone Encounter (Signed)
Prior auth approved

## 2023-02-18 ENCOUNTER — Other Ambulatory Visit: Payer: Self-pay

## 2023-02-18 DIAGNOSIS — M5410 Radiculopathy, site unspecified: Secondary | ICD-10-CM

## 2023-02-18 DIAGNOSIS — M48061 Spinal stenosis, lumbar region without neurogenic claudication: Secondary | ICD-10-CM

## 2023-02-18 MED ORDER — GABAPENTIN 600 MG PO TABS: ORAL_TABLET | ORAL | 0 refills | Status: DC

## 2023-02-25 ENCOUNTER — Other Ambulatory Visit: Payer: Self-pay

## 2023-02-25 DIAGNOSIS — J0410 Acute tracheitis without obstruction: Secondary | ICD-10-CM

## 2023-02-25 MED ORDER — IPRATROPIUM-ALBUTEROL 0.5-2.5 (3) MG/3ML IN SOLN: 3.0000 mL | RESPIRATORY_TRACT | 1 refills | Status: DC | PRN

## 2023-02-26 ENCOUNTER — Telehealth: Payer: PPO | Admitting: Neurology

## 2023-02-26 ENCOUNTER — Telehealth: Payer: Self-pay | Admitting: *Deleted

## 2023-02-26 ENCOUNTER — Ambulatory Visit: Payer: PPO | Admitting: Neurology

## 2023-02-26 ENCOUNTER — Telehealth: Payer: Self-pay | Admitting: Neurology

## 2023-02-26 ENCOUNTER — Encounter: Payer: Self-pay | Admitting: Neurology

## 2023-02-26 ENCOUNTER — Other Ambulatory Visit: Payer: Self-pay

## 2023-02-26 DIAGNOSIS — G4733 Obstructive sleep apnea (adult) (pediatric): Secondary | ICD-10-CM | POA: Diagnosis not present

## 2023-02-26 DIAGNOSIS — I493 Ventricular premature depolarization: Secondary | ICD-10-CM

## 2023-02-26 DIAGNOSIS — F5101 Primary insomnia: Secondary | ICD-10-CM | POA: Diagnosis not present

## 2023-02-26 DIAGNOSIS — Z789 Other specified health status: Secondary | ICD-10-CM | POA: Diagnosis not present

## 2023-02-26 DIAGNOSIS — I2510 Atherosclerotic heart disease of native coronary artery without angina pectoris: Secondary | ICD-10-CM

## 2023-02-26 MED ORDER — ZOLPIDEM TARTRATE 5 MG PO TABS: ORAL_TABLET | ORAL | 5 refills | Status: DC

## 2023-02-26 MED ORDER — CLOPIDOGREL BISULFATE 75 MG PO TABS: ORAL_TABLET | ORAL | 1 refills | Status: DC

## 2023-02-26 NOTE — Telephone Encounter (Signed)
I called and left detailed message on pt voicemail that I was call to go over mediation list before my chart visit at 10:00am.   I asked pt to call back.

## 2023-02-26 NOTE — Telephone Encounter (Signed)
LVM and sent mychart msg asking pt to call back to schedule 7 month follow up with NP. Appointment can be virtual per Dr. Vickey Huger

## 2023-02-26 NOTE — Progress Notes (Signed)
Virtual Visit via Video Note   Annual Sleep Clinic Visit.  I connected with Bryan [name] on 02/26/23 at 10:00 AM EST by a video enabled telemedicine application and verified that I am speaking with the correct person using two identifiers.  Location: Patient: at his home Provider: at her office, [facility]   His PCP was Dr Oneta Rack, MD   I discussed the limitations of evaluation and management by telemedicine and the availability of in person appointments. The patient expressed understanding and agreed to proceed.  History of Present Illness:  Bryan [name] , dx by test on 12-29-2022- and had still symptoms into January 2025, now recovered.  Non productive coughing has improved, he has a dry mouth in AM.   CPAP has not been working well for him.  Has chronic insomnia- and now switched to dental device, Zolpidem helped with that. His wife is happy when he is on it, as his snoring is less. He had a dental  device made, again the first one with Dr Myrtis Ser, now online order-.  The device controls snoring but is it  working for apnea.  His wife is not noticing apneas anymore.  He can sleep longer in the morning, since retired. Marland Kitchen    REVISIT 2024, 02-26-2022;  Bryan [name],  a 73 year-old  caucasian male patient.  He reports not doing well with CPAP and has given up on therapy with CPAP. He changed to an oral device. His wife feels he is snoring less, less jerking in sleep, easier to fall asleep and stay asleep. More restful.  He is now retired (18 months ago) and he has such a reduction in stress , he feels he sleeps better.  He is doing well for the last 6 months-  he should not have to use CPAP given the improvement in sleep and snoring. He may still have hypoxia though .No morning headaches.  No nocturia.    Epworth score is today : 4/ 24 , FSS at 23/ 63 points, GDS : 5/ 15 -  better since retirement.  No more business travel.     HST 2023:   RV on 12-13-2020, Bryan [name] " [address] is  here for a 6 months visit. His last visit was dedicated to hand pain, not to CPAP compliance. He has retired and has better sleep habits, less STRESSED- and he may have no longer hav a need for Ambien. He still has trazodone at home- I like for him to use it again, and take it 1-2 hours before bedtime ! . I have suspected he is a short sleep sleeper - and has been all his life.  He feels well the next day- and he is only bothered when he tries to hard.  Has some PVCs.  Excellent response to CPAP auto settings between 8 and 18 cm water now- 3 cm EPR, residual AHI 1.5 /h. He is due to a new machine next year and I like for him to undergo a baseline study.    Observations/Objective:   Bryan [name] , dx by test on 12-29-2022- and had still symptoms into January 2025, now recovered.  Non productive coughing has improved, he has a dry mouth in AM.    CPAP has not been working well for him.  Has chronic insomnia- and now switched to dental device, Zolpidem helped with that. His wife is happy when he is on it, as his snoring is less. He had a dental  device made, again the first one with Dr Myrtis Ser, now online order-.  The device controls snoring but is it  working for apnea.  His wife is not noticing apneas anymore.  He can sleep longer in the morning, since retired. .   How likely are you to doze in the following situations: 0 = not likely, 1 = slight chance, 2 = moderate chance, 3 = high chance  Sitting and Reading? Watching Television? Sitting inactive in a public place (theater or meeting)? Lying down in the afternoon when circumstances permit? Sitting and talking to someone? Sitting quietly after lunch without alcohol? In a car, while stopped for a few minutes in traffic? As a passenger in a car for an hour without a break?  Total = 4/ 24 , FSS not provided.     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    Assessment and Plan:   " I have  never in my whole live not having had problems going to sleep, even in High School"   Primary Insomnia,  Chronic '  I refilled Ambien for this patient , who has tried melatonin, trazodone ( with Dr Oneta Rack) , breathing techniques, and participated in a [place] sponsored insomnia meditation program, cognitive Behavior Therapy.  He is aware of the data suggesting dementia may set on earlier in users of benzodiazepines and other GABAergic receptor medications. .   OSA on dental device  Follow Up Instructions:he is doing well on an OTC  dental device, he feels relaxed, his sleep is improved. Alert, not fatigued.    2) he is not wanting to return to CPAP . Snoring is better. May still have hypoxia. We decided not to check on HST as the therapy rec may not be followed ( even if CPAP or Oxygen could benefit him.     Will follow up in 12 months unless needing refills for Ambien.  Visit can be virtual.     I discussed the assessment and treatment plan with the patient. The patient was provided an opportunity to ask questions and all were answered. The patient agreed with the plan and demonstrated an understanding of the instructions.   The patient was advised to call back or seek an in-person evaluation if the symptoms worsen or if the condition fails to improve as anticipated.  I provided 30 minutes of non-face-to-face time during this encounter.   [name], MD

## 2023-02-26 NOTE — Patient Instructions (Signed)
Assessment and Plan:   I refilled Ambien for this patient , who has tried melatonin, trazodone ( with Dr Oneta Rack) , breathing techniques, and participated in a [place] sponsored insomnia meditation program, cognitive Behavior Therapy.  He is aware of the data suggesting dementia may set on earlier in users of benzodiazepines and other GABAergic receptor medications. .     Follow Up Instructions:he is doing well on an OTC  dental device, he feels relaxed, his sleep is improved. Alert, not fatigued.    2) he is not wanting to return to CPAP . Snoring is better. May still have hypoxia. We decided not to check on HST as the therapy rec may not be followed ( even if CPAP or Oxygen could benefit him.     Will follow up in 12 months unless needing refills for Ambien.  Visit can be virtual.     I discussed the assessment and treatment plan with the patient. The patient was provided an opportunity to ask questions and all were answered. The patient agreed with the plan and demonstrated an understanding of the instructions.   The patient was advised to call back or seek an in-person evaluation if the symptoms worsen or if the condition fails to improve as anticipated.  I provided 30 minutes of non-face-to-face time during this encounter.

## 2023-03-06 ENCOUNTER — Ambulatory Visit (HOSPITAL_BASED_OUTPATIENT_CLINIC_OR_DEPARTMENT_OTHER)
Admission: RE | Admit: 2023-03-06 | Discharge: 2023-03-06 | Disposition: A | Discharge status: Home / Self Care | Payer: PPO | Source: Ambulatory Visit | Attending: Internal Medicine | Admitting: Internal Medicine

## 2023-03-06 ENCOUNTER — Ambulatory Visit
Admission: EM | Admit: 2023-03-06 | Discharge: 2023-03-06 | Disposition: A | Discharge status: Home / Self Care | Payer: PPO | Attending: Internal Medicine | Admitting: Internal Medicine

## 2023-03-06 ENCOUNTER — Encounter: Payer: Self-pay | Admitting: Emergency Medicine

## 2023-03-06 ENCOUNTER — Other Ambulatory Visit: Payer: Self-pay

## 2023-03-06 DIAGNOSIS — J209 Acute bronchitis, unspecified: Secondary | ICD-10-CM | POA: Diagnosis not present

## 2023-03-06 DIAGNOSIS — R051 Acute cough: Secondary | ICD-10-CM | POA: Diagnosis not present

## 2023-03-06 DIAGNOSIS — R059 Cough, unspecified: Secondary | ICD-10-CM | POA: Diagnosis present

## 2023-03-06 MED ORDER — BENZONATATE 100 MG PO CAPS: 100.0000 mg | ORAL_CAPSULE | Freq: Three times a day (TID) | ORAL | 0 refills | Status: DC

## 2023-03-06 MED ORDER — PREDNISONE 20 MG PO TABS
40.0000 mg | ORAL_TABLET | Freq: Every day | ORAL | 0 refills | Status: AC
Start: 2023-03-06 — End: 2023-03-11

## 2023-03-06 NOTE — Discharge Instructions (Signed)
 You have bronchitis which is inflammation of the upper airways in your lungs due to a virus.   We will treat this with steroids. Do not take any NSAIDs with steroid pills (no ibuprofen, naproxen while taking steroid, this could cause stomach upset).   Use albuterol every 4-6 hours as needed for cough, shortness of breath, and wheezing.   Use guaifenesin (plain mucinex) to break up congestion in nose/chest so that you are able to excrete easier. Drink plenty of fluids to stay well hydrated while taking mucinex so that it works well in the body.   If you develop any new or worsening symptoms or if your symptoms do not start to improve, please return here or follow-up with your primary care provider. If your symptoms are severe, please go to the emergency room.

## 2023-03-06 NOTE — ED Triage Notes (Signed)
 Patient presents with c/o non productive cough x 10 days. Patient states he is taking mucinex and med neb treatments for the symptoms.

## 2023-03-06 NOTE — ED Provider Notes (Signed)
 [name]    CSN: [id number] Arrival date & time: 03/06/23  1450      History   Chief Complaint Chief Complaint  Patient presents with   Cough    HPI Bryan [name] is a 73 y.o. male.   Bryan [name] is a 73 y.o. male presenting for chief complaint of Cough that started approximately 10 days ago. He was sick with COVID-19 8 weeks ago with cough/congestion. Symptoms improved, then worsened again 7-10 days ago. Cough is dry and he reports associated intermittent shortness of breath. Never smoker, denies drug use. He has access to an albuterol nebulizer due to history of bronchitis. Albuterol nebulizer has provided minimal relief of shortness of breath/cough. Denies recent antibiotic/steroid use. No N/V/D, abdominal pain, rash, dizziness, or fever/chills. Taking mucinex with some relief.    Cough   Past Medical History:  Diagnosis Date   Allergy    CAD (coronary artery disease), native coronary artery    Cath 04/19/2001  normal Left main, moderate proximal disease, 80% stenosis distal LAD, 99% stenosis proximal OM 1, 60% stenosis mid RCA, 70% stenosis ostial mid PDA  PCI with bare metal stent of OM1 same date 3.5 x 18 mm Multilink     Cardiac arrhythmia due to congenital heart disease    Colon polyps    Diverticulosis    GERD (gastroesophageal reflux disease)    Hernia    History of PSVT    Ablation done in 2004 at [facility]    Hyperlipidemia    Hypertension    Sleep apnea    Symptomatic PVCs    Vitamin D deficiency     Patient Active Problem List   Diagnosis Date Noted   Chronic intermittent hypoxia with obstructive sleep apnea 02/26/2023   History of colonic polyps 08/27/2021   Short sleeper 01/29/2021   Dependence on CPAP ventilation 01/29/2021   Moderate obstructive sleep apnea-hypopnea syndrome 06/01/2019   Intolerance of continuous positive airway pressure (CPAP) ventilation 06/01/2019   OSA on CPAP 05/03/2019   Dental anomaly 05/01/2019    Severe obstructive sleep apnea-hypopnea syndrome 05/01/2019   Psychophysiological insomnia 03/31/2019   Aorto-iliac atherosclerosis (HCC) by Pelvic CT scan 03/2016 02/06/2019   ED (erectile dysfunction) 04/22/2018   Insomnia 01/10/2015   Obesity (BMI 30.0-34.9) 10/23/2014   CKD (chronic kidney disease) stage 2, GFR 60-89 ml/min 06/20/2014   Symptomatic PVCs    Abnormal glucose 06/02/2013   Antiplatelet or antithrombotic long-term use 06/02/2013   Coronary artery disease involving native coronary artery of native heart without angina pectoris    History of PSVT    Hyperlipidemia, mixed    Essential hypertension    Vitamin D deficiency     Past Surgical History:  Procedure Laterality Date   ACHILLES TENDON REPAIR     left   APPENDECTOMY     CARDIAC ELECTROPHYSIOLOGY MAPPING AND ABLATION     COLONOSCOPY     CORONARY ANGIOPLASTY WITH STENT PLACEMENT     years ago       Home Medications    Prior to Admission medications   Medication Sig Start Date End Date Taking? Authorizing Provider  benzonatate (TESSALON) 100 MG capsule Take 1 capsule (100 mg total) by mouth every 8 (eight) hours. 03/06/23  Yes Carlisle Beers, FNP  predniSONE (DELTASONE) 20 MG tablet Take 2 tablets (40 mg total) by mouth daily with breakfast for 5 days. 03/06/23 03/11/23 Yes Carlisle Beers, FNP  clopidogrel (PLAVIX) 75 MG tablet TAKE  ONE TABLET BY MOUTH DAILY TO PREVENT BLOOD CLOTS 02/26/23   Worthy Rancher B, FNP  ezetimibe (ZETIA) 10 MG tablet Take  1 tablet  Daily  for Cholesterol 03/25/22   Lucky Cowboy, MD  gabapentin (NEURONTIN) 600 MG tablet TAKE 1/2 TO 1 TABLET BY MOUTH 3 TIMES A DAY AS NEEDED FOR PAIN 02/18/23   Worthy Rancher B, FNP  ipratropium-albuterol (DUONEB) 0.5-2.5 (3) MG/3ML SOLN Take 3 mLs by nebulization every 4 (four) hours as needed. 02/25/23   Eulis Foster, FNP  nebivolol (BYSTOLIC) 10 MG tablet TAKE ONE TABLET BY MOUTH DAILY FOR BLOOD PRESSURE 04/15/22   Raynelle Dick, NP   nitroGLYCERIN (NITROSTAT) 0.4 MG SL tablet Dissolve 1 tablet under tongue every 3 to 5 minutes if needed for Angina 08/15/19   Lucky Cowboy, MD  rosuvastatin (CRESTOR) 20 MG tablet Take  1 tablet Daily for  Cholesterol 04/08/22   Lucky Cowboy, MD  tadalafil (CIALIS) 20 MG tablet Take  1/2 to 1 tablet   every 2 to 3 days   as needed for  XXXX 09/27/22   Lucky Cowboy, MD  telmisartan (MICARDIS) 80 MG tablet Take  1 tablet  Daily  for BP 03/27/22   Lucky Cowboy, MD  trazodone (DESYREL) 300 MG tablet Take  1 to 2 tablets (600 mg)  1 to 2 hours before Bedtime   as needed for Sleep 01/22/23   Lucky Cowboy, MD  valACYclovir (VALTREX) 500 MG tablet Take 1 tablet 2 x /day for Fever Blisters / Cold Sores 01/05/23   Raynelle Dick, NP  zolpidem (AMBIEN) 5 MG tablet TAKE ONE TABLET BY MOUTH EVERY EVENING AS NEEDED FOR SLEEP 02/26/23   Dohmeier, Porfirio Mylar, MD    Family History Family History  Problem Relation Age of Onset   Hypothyroidism Mother    Irritable bowel syndrome Mother    Colonic polyp Mother        PRE-malignant polyp of rectosigmoid colon   Colon polyps Mother    Brain cancer Father    Celiac disease Brother    Heart disease Maternal Grandfather    Esophageal cancer Neg Hx    Stomach cancer Neg Hx    Pancreatic cancer Neg Hx    Liver disease Neg Hx    Colon cancer Neg Hx    Rectal cancer Neg Hx     Social History Social History   Tobacco Use   Smoking status: Never   Smokeless tobacco: Never  Vaping Use   Vaping status: Never Used  Substance Use Topics   Alcohol use: Yes    Comment: rarely   Drug use: No     Allergies   Lisinopril, Amoxicillin, and Penicillins   Review of Systems Review of Systems  Respiratory:  Positive for cough.   Per HPI   Physical Exam Triage Vital Signs ED Triage Vitals  Encounter Vitals Group     BP 03/06/23 1501 123/74     Systolic BP Percentile --      Diastolic BP Percentile --      Pulse Rate 03/06/23 1501 81      Resp 03/06/23 1501 18     Temp 03/06/23 1501 98.3 F (36.8 C)     Temp Source 03/06/23 1501 Oral     SpO2 03/06/23 1501 96 %     Weight --      Height --      Head Circumference --      Peak Flow --  Pain Score 03/06/23 1500 0     Pain Loc --      Pain Education --      Exclude from Growth Chart --    No data found.  Updated Vital Signs BP 123/74   Pulse 81   Temp 98.3 F (36.8 C) (Oral)   Resp 18   SpO2 96%   Visual Acuity Right Eye Distance:   Left Eye Distance:   Bilateral Distance:    Right Eye Near:   Left Eye Near:    Bilateral Near:     Physical Exam Vitals and nursing note reviewed.  Constitutional:      Appearance: He is not ill-appearing or toxic-appearing.  HENT:     Head: Normocephalic and atraumatic.     Right Ear: Hearing, tympanic membrane, ear canal and external ear normal.     Left Ear: Hearing, tympanic membrane, ear canal and external ear normal.     Nose: Nose normal.     Mouth/Throat:     Lips: Pink.     Mouth: Mucous membranes are moist. No injury or oral lesions.     Dentition: Normal dentition.     Tongue: No lesions.     Pharynx: Oropharynx is clear. Uvula midline. No pharyngeal swelling, oropharyngeal exudate, posterior oropharyngeal erythema, uvula swelling or postnasal drip.     Tonsils: No tonsillar exudate.  Eyes:     General: Lids are normal. Vision grossly intact. Gaze aligned appropriately.     Extraocular Movements: Extraocular movements intact.     Conjunctiva/sclera: Conjunctivae normal.  Neck:     Trachea: Trachea and phonation normal.  Cardiovascular:     Rate and Rhythm: Normal rate and regular rhythm.     Heart sounds: Normal heart sounds, S1 normal and S2 normal.  Pulmonary:     Effort: Pulmonary effort is normal. No respiratory distress.     Breath sounds: Normal air entry. No wheezing, rhonchi or rales.     Comments: Speaking in full sentences without difficulty. Course breath sounds throughout, non-focal lung  exam. Chest:     Chest wall: No tenderness.  Musculoskeletal:     Cervical back: Neck supple.  Lymphadenopathy:     Cervical: No cervical adenopathy.  Skin:    General: Skin is warm and dry.     Capillary Refill: Capillary refill takes less than 2 seconds.     Findings: No rash.  Neurological:     General: No focal deficit present.     Mental Status: He is alert and oriented to person, place, and time. Mental status is at baseline.     Cranial Nerves: No dysarthria or facial asymmetry.  Psychiatric:        Mood and Affect: Mood normal.        Speech: Speech normal.        Behavior: Behavior normal.        Thought Content: Thought content normal.        Judgment: Judgment normal.      UC Treatments / Results  Labs (all labs ordered are listed, but only abnormal results are displayed) Labs Reviewed - No data to display  EKG   Radiology DG Chest 2 View Result Date: 03/06/2023 CLINICAL DATA:  Cough for 10 days. EXAM: CHEST - 2 VIEW COMPARISON:  09/17/2015. FINDINGS: Bilateral lung fields are clear. Bilateral costophrenic angles are clear. Normal cardio-mediastinal silhouette. No acute osseous abnormalities. The soft tissues are within normal limits. IMPRESSION: No active cardiopulmonary disease. Electronically  Signed   By: Jules Schick M.D.   On: 03/06/2023 16:49    Procedures Procedures (including critical care time)  Medications Ordered in UC Medications - No data to display  Initial Impression / Assessment and Plan / UC Course  I have reviewed the triage vital signs and the nursing notes.  Pertinent labs & imaging results that were available during my care of the patient were reviewed by me and considered in my medical decision making (see chart for details).   1. Acute bronchitis, acute cough Evaluation suggests viral bronchitis, however chest x-ray ordered given clinical concern for underlying focal consolidation/pneumonia. Staff will call if chest x-ray is  abnormal or if results require change in current treatment plan.   Recommend treatment with steroid, bronchodilator, cough suppressants for symptomatic relief, and expectorants (mucinex) as needed- see AVS.    Counseled patient on potential for adverse effects with medications prescribed/recommended today, strict ER and return-to-clinic precautions discussed, patient verbalized understanding.    Final Clinical Impressions(s) / UC Diagnoses   Final diagnoses:  Acute bronchitis, unspecified organism  Acute cough     Discharge Instructions      You have bronchitis which is inflammation of the upper airways in your lungs due to a virus.   We will treat this with steroids. Do not take any NSAIDs with steroid pills (no ibuprofen, naproxen while taking steroid, this could cause stomach upset).   Use albuterol every 4-6 hours as needed for cough, shortness of breath, and wheezing.   Use guaifenesin (plain mucinex) to break up congestion in nose/chest so that you are able to excrete easier. Drink plenty of fluids to stay well hydrated while taking mucinex so that it works well in the body.   If you develop any new or worsening symptoms or if your symptoms do not start to improve, please return here or follow-up with your primary care provider. If your symptoms are severe, please go to the emergency room.     ED Prescriptions     Medication Sig Dispense Auth. Provider   predniSONE (DELTASONE) 20 MG tablet Take 2 tablets (40 mg total) by mouth daily with breakfast for 5 days. 10 tablet Reita May M, FNP   benzonatate (TESSALON) 100 MG capsule Take 1 capsule (100 mg total) by mouth every 8 (eight) hours. 21 capsule Carlisle Beers, FNP      PDMP not reviewed this encounter.   Carlisle Beers, [place] 03/07/23 1955

## 2023-03-11 ENCOUNTER — Encounter: Payer: PPO | Admitting: Internal Medicine

## 2023-03-18 ENCOUNTER — Encounter: Payer: PPO | Admitting: Internal Medicine

## 2023-03-19 ENCOUNTER — Other Ambulatory Visit: Payer: Self-pay

## 2023-03-19 DIAGNOSIS — E782 Mixed hyperlipidemia: Secondary | ICD-10-CM

## 2023-03-19 MED ORDER — EZETIMIBE 10 MG PO TABS: ORAL_TABLET | ORAL | 3 refills | Status: DC

## 2023-03-26 ENCOUNTER — Encounter: Payer: Self-pay | Admitting: Emergency Medicine

## 2023-03-26 ENCOUNTER — Ambulatory Visit: Admitting: Radiology

## 2023-03-26 ENCOUNTER — Ambulatory Visit: Admission: EM | Admit: 2023-03-26 | Discharge: 2023-03-26 | Disposition: A | Discharge status: Home / Self Care

## 2023-03-26 DIAGNOSIS — R053 Chronic cough: Secondary | ICD-10-CM

## 2023-03-26 DIAGNOSIS — J208 Acute bronchitis due to other specified organisms: Secondary | ICD-10-CM

## 2023-03-26 MED ORDER — DEXAMETHASONE 6 MG PO TABS: 6.0000 mg | ORAL_TABLET | Freq: Every day | ORAL | 0 refills | Status: AC

## 2023-03-26 MED ORDER — AZELASTINE HCL 0.1 % NA SOLN: 1.0000 | Freq: Two times a day (BID) | NASAL | 1 refills | Status: AC

## 2023-03-26 MED ORDER — IPRATROPIUM-ALBUTEROL 0.5-2.5 (3) MG/3ML IN SOLN
3.0000 mL | Freq: Once | RESPIRATORY_TRACT | Status: AC
  Administered 2023-03-26: 3 mL via RESPIRATORY_TRACT

## 2023-03-26 MED ORDER — AZITHROMYCIN 250 MG PO TABS: 250.0000 mg | ORAL_TABLET | Freq: Every day | ORAL | 0 refills | Status: DC

## 2023-03-26 MED ORDER — DOXYCYCLINE HYCLATE 100 MG PO CAPS: 100.0000 mg | ORAL_CAPSULE | Freq: Two times a day (BID) | ORAL | 0 refills | Status: DC

## 2023-03-26 MED ORDER — IPRATROPIUM-ALBUTEROL 0.5-2.5 (3) MG/3ML IN SOLN: 3.0000 mL | Freq: Four times a day (QID) | RESPIRATORY_TRACT | 1 refills | Status: DC | PRN

## 2023-03-26 NOTE — Discharge Instructions (Signed)
 1. Persistent cough for 3 weeks or longer - ipratropium-albuterol (DUONEB) 0.5-2.5 (3) MG/3ML nebulizer solution 3 mL given in UC for wheezing and persistent cough - DG Chest 2 View x-ray performed in UC shows no acute cardiopulmonary processes or consolidations significant for pneumonia.  Final radiologist read still pending if any findings reported on final report he will be contacted and appropriate treatment provided.  2. Acute bronchitis due to other specified organisms (Primary) - dexamethasone (DECADRON) 6 MG tablet; Take 1 tablet (6 mg total) by mouth daily for 10 days.  Dispense: 10 tablet; Refill: 0 - doxycycline (VIBRAMYCIN) 100 MG capsule; Take 1 capsule (100 mg total) by mouth 2 (two) times daily.  Dispense: 20 capsule; Refill: 0 - azelastine (ASTELIN) 0.1 % nasal spray; Place 1 spray into both nostrils 2 (two) times daily. Use in each nostril as directed  Dispense: 30 mL; Refill: 1 - ipratropium-albuterol (DUONEB) 0.5-2.5 (3) MG/3ML SOLN; Take 3 mLs by nebulization every 6 (six) hours as needed.  Dispense: 360 mL; Refill: 1 -Continue to monitor symptoms for any change in severity if there is any escalation of current symptoms or development of new symptoms follow-up in ER for further evaluation and management

## 2023-03-26 NOTE — ED Triage Notes (Signed)
 Pt c/o cough congestion, and drainage for 1 month. States he was seen on 03/06/23 and given prednisone which helped a little but he is still having symptoms

## 2023-03-26 NOTE — ED Provider Notes (Signed)
 [facility]  Note:  This document was prepared using Dragon voice recognition software and may include unintentional dictation errors.  MRN: [id number] DOB: [birth year]  Subjective:   [name] is a 73 y.o. male presenting for persistent cough, nasal congestion, chest congestion, wheezing x 1 month.  Patient was seen here on 03/06/2023 given prednisone for 5 days which helped slightly but did not resolve symptoms.  Patient denies any history of asthma or COPD.  Patient states that he was recently prescribed nebulizer treatments to do at home by his primary care provider which she has been using frequently for wheezing and cough with mild improvement.  Patient is concerned that he may have developed a secondary infection and believes that he needs antibiotics and longer course of steroids.  Denies fever, shortness of breath, chest pain, weakness, dizziness.  No current facility-administered medications for this encounter.  Current Outpatient Medications:    azelastine (ASTELIN) 0.1 % nasal spray, Place 1 spray into both nostrils 2 (two) times daily. Use in each nostril as directed, Disp: 30 mL, Rfl: 1   azithromycin (ZITHROMAX) 250 MG tablet, Take 1 tablet (250 mg total) by mouth daily. Take first 2 tablets together, then 1 every day until finished., Disp: 6 tablet, Rfl: 0   dexamethasone (DECADRON) 6 MG tablet, Take 1 tablet (6 mg total) by mouth daily for 10 days., Disp: 10 tablet, Rfl: 0   ipratropium-albuterol (DUONEB) 0.5-2.5 (3) MG/3ML SOLN, Take 3 mLs by nebulization every 6 (six) hours as needed., Disp: 360 mL, Rfl: 1   clopidogrel (PLAVIX) 75 MG tablet, TAKE ONE TABLET BY MOUTH DAILY TO PREVENT BLOOD CLOTS, Disp: 90 tablet, Rfl: 1   ezetimibe (ZETIA) 10 MG tablet, Take  1 tablet  Daily  for Cholesterol, Disp: 90 tablet, Rfl: 3   gabapentin (NEURONTIN) 600 MG tablet, TAKE 1/2 TO 1 TABLET BY MOUTH 3 TIMES A DAY AS NEEDED FOR PAIN, Disp: 270 tablet, Rfl: 0    ipratropium-albuterol (DUONEB) 0.5-2.5 (3) MG/3ML SOLN, Take 3 mLs by nebulization every 4 (four) hours as needed., Disp: 540 mL, Rfl: 1   nebivolol (BYSTOLIC) 10 MG tablet, TAKE ONE TABLET BY MOUTH DAILY FOR BLOOD PRESSURE, Disp: 90 tablet, Rfl: 3   nitroGLYCERIN (NITROSTAT) 0.4 MG SL tablet, Dissolve 1 tablet under tongue every 3 to 5 minutes if needed for Angina, Disp: 50 tablet, Rfl: 11   rosuvastatin (CRESTOR) 20 MG tablet, Take  1 tablet Daily for  Cholesterol, Disp: 90 tablet, Rfl: 3   tadalafil (CIALIS) 20 MG tablet, Take  1/2 to 1 tablet   every 2 to 3 days   as needed for  XXXX, Disp: 30 tablet, Rfl: 1   telmisartan (MICARDIS) 80 MG tablet, Take  1 tablet  Daily  for BP, Disp: 90 tablet, Rfl: 3   trazodone (DESYREL) 300 MG tablet, Take  1 to 2 tablets (600 mg)  1 to 2 hours before Bedtime   as needed for Sleep, Disp: 60 tablet, Rfl: 0   valACYclovir (VALTREX) 500 MG tablet, Take 1 tablet 2 x /day for Fever Blisters / Cold Sores, Disp: 180 tablet, Rfl: 1   zolpidem (AMBIEN) 5 MG tablet, TAKE ONE TABLET BY MOUTH EVERY EVENING AS NEEDED FOR SLEEP, Disp: 30 tablet, Rfl: 5   Allergies  Allergen Reactions   Lisinopril Cough    ACEi cough   Amoxicillin Hives and Swelling   Penicillins Swelling    Past Medical History:  Diagnosis Date   Allergy  CAD (coronary artery disease), native coronary artery    Cath 04/19/2001  normal Left main, moderate proximal disease, 80% stenosis distal LAD, 99% stenosis proximal OM 1, 60% stenosis mid RCA, 70% stenosis ostial mid PDA  PCI with bare metal stent of OM1 same date 3.5 x 18 mm Multilink     Cardiac arrhythmia due to congenital heart disease    Colon polyps    Diverticulosis    GERD (gastroesophageal reflux disease)    Hernia    History of PSVT    Ablation done in 2004 at [facility]    Hyperlipidemia    Hypertension    Sleep apnea    Symptomatic PVCs    Vitamin D deficiency      Past Surgical History:  Procedure Laterality Date    ACHILLES TENDON REPAIR     left   APPENDECTOMY     CARDIAC ELECTROPHYSIOLOGY MAPPING AND ABLATION     COLONOSCOPY     CORONARY ANGIOPLASTY WITH STENT PLACEMENT     years ago    Family History  Problem Relation Age of Onset   Hypothyroidism Mother    Irritable bowel syndrome Mother    Colonic polyp Mother        PRE-malignant polyp of rectosigmoid colon   Colon polyps Mother    Brain cancer Father    Celiac disease Brother    Heart disease Maternal Grandfather    Esophageal cancer Neg Hx    Stomach cancer Neg Hx    Pancreatic cancer Neg Hx    Liver disease Neg Hx    Colon cancer Neg Hx    Rectal cancer Neg Hx     Social History   Tobacco Use   Smoking status: Never   Smokeless tobacco: Never  Vaping Use   Vaping status: Never Used  Substance Use Topics   Alcohol use: Yes    Comment: rarely   Drug use: No    ROS Refer to HPI for ROS details.  Objective:   Vitals: BP 129/76 (BP Location: Right Arm)   Pulse 84   Temp 98.1 F (36.7 C) (Oral)   Resp 17   SpO2 94%   Physical Exam Vitals and nursing note reviewed.  Constitutional:      General: He is not in acute distress.    Appearance: Normal appearance. He is well-developed. He is not ill-appearing or toxic-appearing.  HENT:     Head: Normocephalic and atraumatic.     Nose: Congestion and rhinorrhea present.     Mouth/Throat:     Mouth: Mucous membranes are moist.     Pharynx: Oropharynx is clear.  Cardiovascular:     Rate and Rhythm: Normal rate and regular rhythm.     Heart sounds: No murmur heard. Pulmonary:     Effort: Pulmonary effort is normal. No respiratory distress.     Breath sounds: No stridor. Wheezing present. No rhonchi or rales.  Musculoskeletal:     Cervical back: Neck supple.  Skin:    General: Skin is warm and dry.     Capillary Refill: Capillary refill takes less than 2 seconds.  Neurological:     General: No focal deficit present.     Mental Status: He is alert and  oriented to person, place, and time.  Psychiatric:        Mood and Affect: Mood normal.     Procedures  No results found for this or any previous visit (from the past 24 hours).  Assessment  and Plan :   PDMP not reviewed this encounter.  1. Acute bronchitis due to other specified organisms   2. Persistent cough for 3 weeks or longer    1. Persistent cough for 3 weeks or longer - ipratropium-albuterol (DUONEB) 0.5-2.5 (3) MG/3ML nebulizer solution 3 mL given in UC for wheezing and persistent cough - DG Chest 2 View x-ray performed in UC shows no acute cardiopulmonary processes or consolidations significant for pneumonia.  Final radiologist read still pending if any findings reported on final report he will be contacted and appropriate treatment provided.  2. Acute bronchitis due to other specified organisms (Primary) - dexamethasone (DECADRON) 6 MG tablet; Take 1 tablet (6 mg total) by mouth daily for 10 days.  Dispense: 10 tablet - doxycycline (VIBRAMYCIN) 100 MG capsule; Take 1 capsule (100 mg total) by mouth 2 (two) times daily.  Dispense: 20 capsule; Refill: 0 - azelastine (ASTELIN) 0.1 % nasal spray; Place 1 spray into both nostrils 2 (two) times daily. Use in each nostril as directed  Dispense: 30 mL; Refill: 1 - ipratropium-albuterol (DUONEB) 0.5-2.5 (3) MG/3ML SOLN; Take 3 mLs by nebulization every 6 (six) hours as needed.  Dispense: 360 mL; Refill: 1 -Continue to monitor symptoms for any change in severity if there is any escalation of current symptoms or development of new symptoms follow-up in ER for further evaluation and management  Oda Cogan Crecencio Mc, Woodie Degraffenreid B, NP 03/26/23 1051

## 2023-04-27 ENCOUNTER — Other Ambulatory Visit: Payer: Self-pay | Admitting: Family Medicine

## 2023-04-27 DIAGNOSIS — I2510 Atherosclerotic heart disease of native coronary artery without angina pectoris: Secondary | ICD-10-CM

## 2023-04-27 DIAGNOSIS — N529 Male erectile dysfunction, unspecified: Secondary | ICD-10-CM

## 2023-04-27 DIAGNOSIS — I10 Essential (primary) hypertension: Secondary | ICD-10-CM

## 2023-04-27 NOTE — Telephone Encounter (Unsigned)
 Copied from CRM [id number]. Topic: Clinical - Medication Refill >> Apr 27, 2023  2:56 PM [place] wrote: Most Recent Primary Care Visit:   Medication: telmisartan  (MICARDIS ) 80 MG tablet ; nebivolol  (BYSTOLIC ) 10 MG tablet  Has the patient contacted their pharmacy? Yes (Agent: If no, request that the patient contact the pharmacy for the refill. If patient does not wish to contact the pharmacy document the reason why and proceed with request.) (Agent: If yes, when and what did the pharmacy advise?)  Is this the correct pharmacy for this prescription? Yes If no, delete pharmacy and type the correct one.  This is the patient's preferred pharmacy:  [facility] [id number], [place] - [address] [place] [address] Phone: [phone] Fax: [phone]   Has the prescription been filled recently? Yes  Is the patient out of the medication? Yes  Has the patient been seen for an appointment in the last year OR does the patient have an upcoming appointment? Yes  Can we respond through MyChart? Yes  Agent: Please be advised that Rx refills may take up to 3 business days. We ask that you follow-up with your pharmacy.

## 2023-04-28 ENCOUNTER — Encounter: Payer: Self-pay | Admitting: Family Medicine

## 2023-04-28 DIAGNOSIS — I10 Essential (primary) hypertension: Secondary | ICD-10-CM

## 2023-04-28 MED ORDER — TADALAFIL 20 MG PO TABS: ORAL_TABLET | ORAL | 1 refills | Status: DC

## 2023-04-28 MED ORDER — NEBIVOLOL HCL 10 MG PO TABS: ORAL_TABLET | ORAL | 3 refills | Status: DC

## 2023-05-01 MED ORDER — TELMISARTAN 80 MG PO TABS: ORAL_TABLET | ORAL | 3 refills | Status: DC

## 2023-05-14 ENCOUNTER — Encounter: Payer: PPO | Admitting: Internal Medicine

## 2023-05-14 ENCOUNTER — Encounter (HOSPITAL_COMMUNITY): Payer: Self-pay

## 2023-05-21 ENCOUNTER — Telehealth: Payer: Self-pay

## 2023-05-21 ENCOUNTER — Ambulatory Visit: Admitting: Family Medicine

## 2023-05-21 VITALS — BP 115/64 | HR 70 | Temp 97.7°F | Resp 18 | Ht 71.0 in | Wt 217.6 lb

## 2023-05-21 DIAGNOSIS — F5104 Psychophysiologic insomnia: Secondary | ICD-10-CM

## 2023-05-21 DIAGNOSIS — N529 Male erectile dysfunction, unspecified: Secondary | ICD-10-CM

## 2023-05-21 DIAGNOSIS — E782 Mixed hyperlipidemia: Secondary | ICD-10-CM

## 2023-05-21 DIAGNOSIS — I10 Essential (primary) hypertension: Secondary | ICD-10-CM

## 2023-05-21 DIAGNOSIS — I700 Atherosclerosis of aorta: Secondary | ICD-10-CM | POA: Diagnosis not present

## 2023-05-21 DIAGNOSIS — N138 Other obstructive and reflux uropathy: Secondary | ICD-10-CM | POA: Insufficient documentation

## 2023-05-21 DIAGNOSIS — I2510 Atherosclerotic heart disease of native coronary artery without angina pectoris: Secondary | ICD-10-CM | POA: Diagnosis not present

## 2023-05-21 DIAGNOSIS — I708 Atherosclerosis of other arteries: Secondary | ICD-10-CM

## 2023-05-21 DIAGNOSIS — N401 Enlarged prostate with lower urinary tract symptoms: Secondary | ICD-10-CM

## 2023-05-21 DIAGNOSIS — G4733 Obstructive sleep apnea (adult) (pediatric): Secondary | ICD-10-CM

## 2023-05-21 DIAGNOSIS — R3912 Poor urinary stream: Secondary | ICD-10-CM

## 2023-05-21 DIAGNOSIS — K4090 Unilateral inguinal hernia, without obstruction or gangrene, not specified as recurrent: Secondary | ICD-10-CM

## 2023-05-21 DIAGNOSIS — Z7689 Persons encountering health services in other specified circumstances: Secondary | ICD-10-CM

## 2023-05-21 MED ORDER — EZETIMIBE 10 MG PO TABS: ORAL_TABLET | ORAL | 3 refills | Status: AC

## 2023-05-21 MED ORDER — CLOPIDOGREL BISULFATE 75 MG PO TABS: ORAL_TABLET | ORAL | 1 refills | Status: DC

## 2023-05-21 MED ORDER — TADALAFIL 20 MG PO TABS: 20.0000 mg | ORAL_TABLET | Freq: Every day | ORAL | 3 refills | Status: DC

## 2023-05-21 MED ORDER — TADALAFIL 20 MG PO TABS: 20.0000 mg | ORAL_TABLET | Freq: Every day | ORAL | 3 refills | Status: AC

## 2023-05-21 MED ORDER — NITROGLYCERIN 0.4 MG SL SUBL
SUBLINGUAL_TABLET | SUBLINGUAL | 11 refills | Status: AC
Start: 2023-05-21 — End: ?

## 2023-05-21 MED ORDER — TELMISARTAN 80 MG PO TABS
ORAL_TABLET | ORAL | 3 refills | Status: AC
Start: 2023-05-21 — End: ?

## 2023-05-21 MED ORDER — ROSUVASTATIN CALCIUM 20 MG PO TABS: ORAL_TABLET | ORAL | 3 refills | Status: AC

## 2023-05-21 MED ORDER — NEBIVOLOL HCL 10 MG PO TABS: ORAL_TABLET | ORAL | 3 refills | Status: AC

## 2023-05-21 NOTE — Progress Notes (Signed)
 Assessment & Plan   Assessment/Plan:    Assessment & Plan Coronary artery disease Coronary artery disease with a stent in the circumflex artery post-myocardial infarction at age 73. No chest pain or nitroglycerin  use since the event. On clopidogrel  for antiplatelet therapy. Discussed the interactions with nitrates and not to take nitroglycerin  if he has taken the tadalafil .  Patient aware of this risk and states that he will.  Has not taken nitroglycerin  for chest pain in greater than 10 years and he has no chest pain at this time.  He is active works out regularly and has no dyspnea or shortness of breath - Refill clopidogrel  75 mg daily - Refill nitroglycerin  0.4 mg sublingual tablets  Hypertension Hypertension well-controlled with telmisartan  80 mg and nebivolol  10 mg. Home monitoring shows expected post-exercise elevation. - Refill telmisartan  80 mg - Refill nebivolol  10 mg  Hyperlipidemia Hyperlipidemia managed with rosuvastatin  20 mg and Zetia  10 mg. Previously switched from Lipitor to rosuvastatin . - Refill rosuvastatin  20 mg - Refill Zetia  10 mg  Erectile dysfunction and BPH with LUTS Erectile dysfunction likely vascular in origin. Managed with tadalafil  20 mg as needed. Discussed potential benefits for urinary symptoms related to enlarged prostate. Tadalafil  may benefit both conditions. - Prescribe tadalafil  20 mg daily for erectile dysfunction and potential enlarged prostate - Monitor urinary symptoms  Rght inguinal hernia Umbilical hernia with occasional gurgling sounds. No acute symptoms. - Refer to general surgery for evaluation  Insomnia with OSA Chronic insomnia with difficulty initiating and maintaining sleep. Previously managed with zolpidem . Intolerant to CPAP for sleep apnea; using a mouth guard with some improvement. Discussed zolpidem  risks including dementia and dependence. - Continue zolpidem  as previously prescribed - Discuss alternative insomnia treatments  at next visit      Medications Discontinued During This Encounter  Medication Reason   azithromycin  (ZITHROMAX ) 250 MG tablet    dexamethasone  (DECADRON ) 2 MG tablet    fluconazole  (DIFLUCAN ) 100 MG tablet    ipratropium-albuterol  (DUONEB) 0.5-2.5 (3) MG/3ML SOLN    ipratropium-albuterol  (DUONEB) 0.5-2.5 (3) MG/3ML SOLN    trazodone  (DESYREL ) 300 MG tablet    nitroGLYCERIN  (NITROSTAT ) 0.4 MG SL tablet Reorder   rosuvastatin  (CRESTOR ) 20 MG tablet Reorder   clopidogrel  (PLAVIX ) 75 MG tablet Reorder   ezetimibe  (ZETIA ) 10 MG tablet Reorder   nebivolol  (BYSTOLIC ) 10 MG tablet Reorder   tadalafil  (CIALIS ) 20 MG tablet Reorder   telmisartan  (MICARDIS ) 80 MG tablet Reorder    Return in about 1 month (around 06/21/2023) for physical (fasting labs).        Subjective:   Encounter date: 05/21/2023  Bryan [name] is a 73 y.o. male who has Hyperlipidemia, mixed; Essential hypertension; Vitamin D  deficiency; Abnormal glucose; Antiplatelet or antithrombotic long-term use; Coronary artery disease involving native coronary artery of native heart without angina pectoris; History of PSVT; Symptomatic PVCs; CKD (chronic kidney disease) stage 2, GFR 60-89 ml/min; Obesity (BMI 30.0-34.9); Insomnia; ED (erectile dysfunction); Aorto-iliac atherosclerosis (HCC) by Pelvic CT scan 03/2016; Psychophysiological insomnia; Dental anomaly; Severe obstructive sleep apnea-hypopnea syndrome; OSA on CPAP; Moderate obstructive sleep apnea-hypopnea syndrome; Intolerance of continuous positive airway pressure (CPAP) ventilation; Short sleeper; Dependence on CPAP ventilation; History of colonic polyps; Chronic intermittent hypoxia with obstructive sleep apnea; Benign prostatic hyperplasia with weak urinary stream; and Right inguinal hernia on their problem list..   He  has a past medical history of Allergy (50 yrs), Anxiety, Arthritis, CAD (coronary artery disease), native coronary artery, Cardiac arrhythmia due to  congenital heart  disease, Colon polyps, Diverticulosis, GERD (gastroesophageal reflux disease), Hernia, History of PSVT, Hyperlipidemia, Hypertension, Myocardial infarction (HCC) (04/19/2001), Sleep apnea, Symptomatic PVCs, and Vitamin D  deficiency.Sayler Mickiewicz Aas   He presents with chief complaint of Establish Care (Rx refill request ( 90 day supply)//HM due- shingles vaccine ), Pain (Pt c/o of right hand pain for 4 years; pain will radiate to shoulder. Pt used Gabapentin  300 mg for pain; symptoms became worse over the past 1-2 weeks. ), and hernia  (Pt c/o of hernia (groin region) for a couple of years. Pt does lift weights 2-3 times per week; no pain is present; referral to general surgery request) .   Discussed the use of AI scribe software for clinical note transcription with the patient, who gave verbal consent to proceed.  History of Present Illness Bryan [name] "Bryan [name]" is a 73 year old male with coronary artery disease and high cholesterol who presents for a transition of care visit and medication refills.  He has a history of coronary artery disease, having suffered a myocardial infarction at age 63, which required a stent placement in the circumflex artery. He has been on clopidogrel  75 mg daily for 20 years and has experienced no angina since the myocardial infarction. He occasionally forgets to carry his nitroglycerin  but has never needed to use it. He is also on rosuvastatin  20 mg and ezetimibe  10 mg for hyperlipidemia management.  He manages hypertension with telmisartan  80 mg and nebivolol  10 mg daily. He monitors his blood pressure at home, which generally remains stable around 118/70 mmHg, though it rises during physical activity.  He experiences erectile dysfunction, for which he takes tadalafil  20 mg every other day as needed. He reports a weak urinary stream.  He has a history of sleep apnea and insomnia, for which he uses zolpidem  for sleep. He has tried CPAP but was intolerant, and currently  uses a mandibular advancement device. He has explored various non-pharmacological methods to improve sleep without success.  He reports a hernia on the right side, which occasionally gurgles, and is seeking a referral for surgical evaluation.     ROS  Past Surgical History:  Procedure Laterality Date   ACHILLES TENDON REPAIR     left   APPENDECTOMY  50 years ago   CARDIAC ELECTROPHYSIOLOGY MAPPING AND ABLATION     COLONOSCOPY     CORONARY ANGIOPLASTY WITH STENT PLACEMENT     years ago    Outpatient Medications Prior to Visit  Medication Sig Dispense Refill   azelastine  (ASTELIN ) 0.1 % nasal spray Place 1 spray into both nostrils 2 (two) times daily. Use in each nostril as directed 30 mL 1   gabapentin  (NEURONTIN ) 600 MG tablet TAKE 1/2 TO 1 TABLET BY MOUTH 3 TIMES A DAY AS NEEDED FOR PAIN 270 tablet 0   valACYclovir  (VALTREX ) 500 MG tablet Take 1 tablet 2 x /day for Fever Blisters / Cold Sores 180 tablet 1   zolpidem  (AMBIEN ) 5 MG tablet TAKE ONE TABLET BY MOUTH EVERY EVENING AS NEEDED FOR SLEEP 30 tablet 5   azithromycin  (ZITHROMAX ) 250 MG tablet Take 1 tablet (250 mg total) by mouth daily. Take first 2 tablets together, then 1 every day until finished. 6 tablet 0   clopidogrel  (PLAVIX ) 75 MG tablet TAKE ONE TABLET BY MOUTH DAILY TO PREVENT BLOOD CLOTS 90 tablet 1   dexamethasone  (DECADRON ) 2 MG tablet Take 2 mg by mouth 3 (three) times daily.     ezetimibe  (ZETIA ) 10 MG tablet Take  1 tablet  Daily  for Cholesterol 90 tablet 3   fluconazole  (DIFLUCAN ) 100 MG tablet Take by mouth.     nebivolol  (BYSTOLIC ) 10 MG tablet TAKE ONE TABLET BY MOUTH DAILY FOR BLOOD PRESSURE 90 tablet 3   nitroGLYCERIN  (NITROSTAT ) 0.4 MG SL tablet Dissolve 1 tablet under tongue every 3 to 5 minutes if needed for Angina 50 tablet 11   rosuvastatin  (CRESTOR ) 20 MG tablet Take  1 tablet Daily for  Cholesterol 90 tablet 3   tadalafil  (CIALIS ) 20 MG tablet Take  1/2 to 1 tablet   every 2 to 3 days   as needed  for  XXXX 30 tablet 1   telmisartan  (MICARDIS ) 80 MG tablet Take  1 tablet  Daily  for BP 90 tablet 3   ipratropium-albuterol  (DUONEB) 0.5-2.5 (3) MG/3ML SOLN Take 3 mLs by nebulization every 4 (four) hours as needed. 540 mL 1   ipratropium-albuterol  (DUONEB) 0.5-2.5 (3) MG/3ML SOLN Take 3 mLs by nebulization every 6 (six) hours as needed. 360 mL 1   trazodone  (DESYREL ) 300 MG tablet Take  1 to 2 tablets (600 mg)  1 to 2 hours before Bedtime   as needed for Sleep 60 tablet 0   No facility-administered medications prior to visit.    Family History  Problem Relation Age of Onset   Hypothyroidism Mother    Irritable bowel syndrome Mother    Colonic polyp Mother        PRE-malignant polyp of rectosigmoid colon   Colon polyps Mother    Arthritis Mother    COPD Mother    Brain cancer Father    Cancer Father    Early death Father    Alcohol abuse Sister    Celiac disease Brother    Heart disease Maternal Grandfather    Hypertension Maternal Grandmother    Asthma Paternal Grandfather    Early death Paternal Grandfather    Heart disease Paternal Grandfather    Anxiety disorder Paternal Aunt    Depression Paternal Aunt    Early death Sister    Esophageal cancer Neg Hx    Stomach cancer Neg Hx    Pancreatic cancer Neg Hx    Liver disease Neg Hx    Colon cancer Neg Hx    Rectal cancer Neg Hx     Social History   Socioeconomic History   Marital status: Married    Spouse name: Not on file   Number of children: 3   Years of education: Not on file   Highest education level: Some college, no degree  Occupational History   Occupation: retired  Tobacco Use   Smoking status: Never   Smokeless tobacco: Never  Vaping Use   Vaping status: Never Used  Substance and Sexual Activity   Alcohol use: Not Currently    Comment: rarely   Drug use: No   Sexual activity: Not Currently    Birth control/protection: None    Comment: Vasectomy  Other Topics Concern   Not on file  Social  History Narrative   Drinks 2 cups of coffee a day    Social Drivers of Corporate investment banker Strain: Low Risk  (05/21/2023)   Overall Financial Resource Strain (CARDIA)    Difficulty of Paying Living Expenses: Not very hard  Food Insecurity: No Food Insecurity (05/21/2023)   Hunger Vital Sign    Worried About Running Out of Food in the Last Year: Never true    Ran Out of Food in  the Last Year: Never true  Transportation Needs: No Transportation Needs (05/21/2023)   PRAPARE - Administrator, Civil Service (Medical): No    Lack of Transportation (Non-Medical): No  Physical Activity: Insufficiently Active (05/21/2023)   Exercise Vital Sign    Days of Exercise per Week: 3 days    Minutes of Exercise per Session: 40 min  Stress: Stress Concern Present (05/21/2023)   [employer] of Occupational Health - Occupational Stress Questionnaire    Feeling of Stress : To some extent  Social Connections: Socially Integrated (05/21/2023)   Social Connection and Isolation Panel [NHANES]    Frequency of Communication with Friends and Family: More than three times a week    Frequency of Social Gatherings with Friends and Family: More than three times a week    Attends Religious Services: More than 4 times per year    Active Member of [employer] or Organizations: Yes    Attends Engineer, structural: More than 4 times per year    Marital Status: Married  Catering manager Violence: Not on file                                                                                                  Objective:  Physical Exam: BP 115/64 (BP Location: Left Arm, Patient Position: Sitting, Cuff Size: Large)   Pulse 70   Temp 97.7 F (36.5 C) (Temporal)   Resp 18   Ht 5\' 11"  (1.803 m)   Wt 217 lb 9.6 oz (98.7 kg)   SpO2 95%   BMI 30.35 kg/m    Physical Exam VITALS: BP- 115/64 GENERAL: Alert, cooperative, well developed, no acute distress. HEENT: Normocephalic, normal oropharynx,  moist mucous membranes. CHEST: Clear to auscultation bilaterally, no wheezes, rhonchi, or crackles. CARDIOVASCULAR: Normal heart rate and rhythm, S1 and S2 normal without murmurs. ABDOMEN: Soft, non-tender, non-distended, without organomegaly, normal bowel sounds. EXTREMITIES: No cyanosis or edema. NEUROLOGICAL: Cranial nerves grossly intact, moves all extremities without gross motor or sensory deficit.     DG Chest 2 View Result Date: 03/26/2023 CLINICAL DATA:  Persistent cough. EXAM: CHEST - 2 VIEW COMPARISON:  March 06, 2023. FINDINGS: The heart size and mediastinal contours are within normal limits. Both lungs are clear. The visualized skeletal structures are unremarkable. IMPRESSION: No active cardiopulmonary disease. Electronically Signed   By: Rosalene Colon M.D.   On: 03/26/2023 10:44   DG Chest 2 View Result Date: 03/06/2023 CLINICAL DATA:  Cough for 10 days. EXAM: CHEST - 2 VIEW COMPARISON:  09/17/2015. FINDINGS: Bilateral lung fields are clear. Bilateral costophrenic angles are clear. Normal cardio-mediastinal silhouette. No acute osseous abnormalities. The soft tissues are within normal limits. IMPRESSION: No active cardiopulmonary disease. Electronically Signed   By: Beula Brunswick M.D.   On: 03/06/2023 16:49    No results found for this or any previous visit (from the past 2160 hours).      Carnell Christian, MD, MS

## 2023-05-21 NOTE — Addendum Note (Signed)
 Addended by: Kasandra Pain B on: 05/21/2023 12:05 PM   Modules accepted: Orders

## 2023-05-21 NOTE — Telephone Encounter (Signed)
 Copied from CRM [id number]. Topic: Clinical - Prescription Issue >> May 21, 2023 10:52 AM Adonis Hoot wrote: Reason for CRM: [facility] regarding medication tadalafil  (CIALIS ) 20 MG tablet. They are needing clearer instruction for this medication. There are 2 separate instructions.  [id number] - [place], [place] - [address]  Phone: [phone] Fax: [phone]

## 2023-05-22 NOTE — Telephone Encounter (Signed)
 Called the patient's pharmacy to ask if updated information for Cialis  20 mg was received from Dr. Hildy Lowers. Person on the phone confirmed they did receive the correct Rx directions yesterday. She thanked me for calling.

## 2023-06-19 ENCOUNTER — Ambulatory Visit: Payer: PPO | Admitting: Nurse Practitioner

## 2023-07-13 ENCOUNTER — Encounter: Admitting: Family Medicine

## 2023-08-05 ENCOUNTER — Encounter: Admitting: Family Medicine

## 2023-08-19 ENCOUNTER — Ambulatory Visit: Payer: PPO | Admitting: Nurse Practitioner

## 2023-08-30 ENCOUNTER — Other Ambulatory Visit: Payer: Self-pay | Admitting: Neurology

## 2023-08-31 ENCOUNTER — Encounter: Payer: Self-pay | Admitting: Neurology

## 2023-09-02 ENCOUNTER — Telehealth (INDEPENDENT_AMBULATORY_CARE_PROVIDER_SITE_OTHER): Admitting: Neurology

## 2023-09-02 DIAGNOSIS — F5104 Psychophysiologic insomnia: Secondary | ICD-10-CM | POA: Diagnosis not present

## 2023-09-02 DIAGNOSIS — G4733 Obstructive sleep apnea (adult) (pediatric): Secondary | ICD-10-CM

## 2023-09-02 DIAGNOSIS — F5101 Primary insomnia: Secondary | ICD-10-CM

## 2023-09-02 MED ORDER — ZOLPIDEM TARTRATE 5 MG PO TABS: ORAL_TABLET | ORAL | 5 refills | Status: AC

## 2023-09-02 NOTE — Progress Notes (Signed)
 Patient: Bryan [name] Date of Birth: 05/17/1950  Reason for Visit: Follow up History from: Patient Primary Neurologist: Dohmeier   Virtual Visit via Video Note  I connected with Bryan [name] on 09/02/23 at 10:15 AM EDT by a video enabled telemedicine application and verified that I am speaking with the correct person using two identifiers.  Location: Patient: at his home Provider: in the office    I discussed the limitations of evaluation and management by telemedicine and the availability of in person appointments. The patient expressed understanding and agreed to proceed.  ASSESSMENT AND PLAN 73 y.o. year old male   1.  Chronic primary insomnia - HST January 2023 showed moderate sleep apnea, 03/11/21 CPAP setup, couldn't tolerate CPAP, retired and felt his sleep was better and no snoring, using OTC dental device, decided not check HST since likely not willing to use CPAP. Dr. Chalice fill his Ambien .  -Continue Ambien  5 mg at bedtime for chronic insomnia, previously was on Ambien  CR 6.25 mg, unclear why he stopped this. May reconsider in the future.  For now we will continue with current dosing of Ambien , has slept the best he has in years.   Follow-up with me in 6 months via video visit for Ambien  refill, establishing with a new primary care provider, if willing to continue refilling Ambien  return here as needed   HISTORY OF PRESENT ILLNESS: Today 09/02/23 Via VV. Using dental device he purchased on his own. Previously had a device made for him, but never fit well, had took to break. Doing great with dental device, no longer snoring. No more apnea spells, or gasping. Takes Ambien  5 mg at bedtime. Goes to sleep well, does wake up sometimes few hours after, managing okay, sleep is best it has been in years, sometimes he goes ahead and gets up. Stress is much more controlled since retired in 2023. Right now, he goes to bed at 10 PM, wakes up around 2 or 3 AM, goes back to sleep,  gets up around 5 AM. 5-6 hours is sleep duration at night. Without Ambien , he will lay there all night long. Enjoys being retired. He has 9 grandchildren.   HISTORY  02/26/23 Dr. Chalice: Mr Cloe contracted Covid , dx by test on 12-29-2022- and had still symptoms into January 2025, now recovered.  Non productive coughing has improved, he has a dry mouth in AM.   CPAP has not been working well for him.  Has chronic insomnia- and now switched to dental device, Zolpidem  helped with that. His wife is happy when he is on it, as his snoring is less. He had a dental  device made, again the first one with Dr Micky, now online order-.  The device controls snoring but is it  working for apnea.  His wife is not noticing apneas anymore.  He can sleep longer in the morning, since retired.  REVIEW OF SYSTEMS: Out of a complete 14 system review of symptoms, the patient complains only of the following symptoms, and all other reviewed systems are negative.  See HPI  ALLERGIES: Allergies  Allergen Reactions   Lisinopril  Cough    ACEi cough   Amoxicillin Hives and Swelling   Penicillins Swelling    HOME MEDICATIONS: Outpatient Medications Prior to Visit  Medication Sig Dispense Refill   azelastine  (ASTELIN ) 0.1 % nasal spray Place 1 spray into both nostrils 2 (two) times daily. Use in each nostril as directed 30 mL 1   clopidogrel  (PLAVIX )  75 MG tablet TAKE ONE TABLET BY MOUTH DAILY TO PREVENT BLOOD CLOTS 90 tablet 1   ezetimibe  (ZETIA ) 10 MG tablet Take  1 tablet  Daily  for Cholesterol 90 tablet 3   gabapentin  (NEURONTIN ) 600 MG tablet TAKE 1/2 TO 1 TABLET BY MOUTH 3 TIMES A DAY AS NEEDED FOR PAIN 270 tablet 0   nebivolol  (BYSTOLIC ) 10 MG tablet TAKE ONE TABLET BY MOUTH DAILY FOR BLOOD PRESSURE 90 tablet 3   nitroGLYCERIN  (NITROSTAT ) 0.4 MG SL tablet Dissolve 1 tablet under tongue every 3 to 5 minutes if needed for Angina 50 tablet 11   rosuvastatin  (CRESTOR ) 20 MG tablet Take  1 tablet Daily for   Cholesterol 90 tablet 3   tadalafil  (CIALIS ) 20 MG tablet Take 1 tablet (20 mg total) by mouth daily. 90 tablet 3   telmisartan  (MICARDIS ) 80 MG tablet Take  1 tablet  Daily  for BP 90 tablet 3   valACYclovir  (VALTREX ) 500 MG tablet Take 1 tablet 2 x /day for Fever Blisters / Cold Sores 180 tablet 1   zolpidem  (AMBIEN ) 5 MG tablet TAKE ONE TABLET BY MOUTH EVERY EVENING AS NEEDED FOR SLEEP 30 tablet 5   No facility-administered medications prior to visit.    PAST MEDICAL HISTORY: Past Medical History:  Diagnosis Date   Allergy 50 yrs   Ragweed.Penicillin   Anxiety    Had Panic Attacks   Arthritis    Sciatic and hands   CAD (coronary artery disease), native coronary artery    Cath 04/19/2001  normal Left main, moderate proximal disease, 80% stenosis distal LAD, 99% stenosis proximal OM 1, 60% stenosis mid RCA, 70% stenosis ostial mid PDA  PCI with bare metal stent of OM1 same date 3.5 x 18 mm Multilink     Cardiac arrhythmia due to congenital heart disease    Colon polyps    Diverticulosis    GERD (gastroesophageal reflux disease)    Hernia    History of PSVT    Ablation done in 2004 at [facility]    Hyperlipidemia    Hypertension    Myocardial infarction [facility]) 04/19/2001   Stent circumflex artery   Sleep apnea    Symptomatic PVCs    Vitamin D  deficiency     PAST SURGICAL HISTORY: Past Surgical History:  Procedure Laterality Date   ACHILLES TENDON REPAIR     left   APPENDECTOMY  50 years ago   CARDIAC ELECTROPHYSIOLOGY MAPPING AND ABLATION     COLONOSCOPY     CORONARY ANGIOPLASTY WITH STENT PLACEMENT     years ago    FAMILY HISTORY: Family History  Problem Relation Age of Onset   Hypothyroidism Mother    Irritable bowel syndrome Mother    Colonic polyp Mother        PRE-malignant polyp of rectosigmoid colon   Colon polyps Mother    Arthritis Mother    COPD Mother    Brain cancer Father    Cancer Father    Early death Father    Alcohol abuse Sister     Celiac disease Brother    Heart disease Maternal Grandfather    Hypertension Maternal Grandmother    Asthma Paternal Grandfather    Early death Paternal Grandfather    Heart disease Paternal Grandfather    Anxiety disorder Paternal Aunt    Depression Paternal Aunt    Early death Sister    Esophageal cancer Neg Hx    Stomach cancer Neg Hx  Pancreatic cancer Neg Hx    Liver disease Neg Hx    Colon cancer Neg Hx    Rectal cancer Neg Hx     SOCIAL HISTORY: Social History   Socioeconomic History   Marital status: Married    Spouse name: Not on file   Number of children: 3   Years of education: Not on file   Highest education level: Some college, no degree  Occupational History   Occupation: retired  Tobacco Use   Smoking status: Never   Smokeless tobacco: Never  Vaping Use   Vaping status: Never Used  Substance and Sexual Activity   Alcohol use: Not Currently    Comment: rarely   Drug use: No   Sexual activity: Not Currently    Birth control/protection: None    Comment: Vasectomy  Other Topics Concern   Not on file  Social History Narrative   Drinks 2 cups of coffee a day    Social Drivers of Corporate investment banker Strain: Low Risk  (05/21/2023)   Overall Financial Resource Strain (CARDIA)    Difficulty of Paying Living Expenses: Not very hard  Food Insecurity: No Food Insecurity (05/21/2023)   Hunger Vital Sign    Worried About Running Out of Food in the Last Year: Never true    Ran Out of Food in the Last Year: Never true  Transportation Needs: No Transportation Needs (05/21/2023)   PRAPARE - Administrator, Civil Service (Medical): No    Lack of Transportation (Non-Medical): No  Physical Activity: Insufficiently Active (05/21/2023)   Exercise Vital Sign    Days of Exercise per Week: 3 days    Minutes of Exercise per Session: 40 min  Stress: Stress Concern Present (05/21/2023)   [employer] of Occupational Health - Occupational Stress  Questionnaire    Feeling of Stress : To some extent  Social Connections: Socially Integrated (05/21/2023)   Social Connection and Isolation Panel    Frequency of Communication with Friends and Family: More than three times a week    Frequency of Social Gatherings with Friends and Family: More than three times a week    Attends Religious Services: More than 4 times per year    Active Member of [employer] or Organizations: Yes    Attends Engineer, structural: More than 4 times per year    Marital Status: Married  Catering manager Violence: Not on file    PHYSICAL EXAM  There were no vitals filed for this visit. There is no height or weight on file to calculate BMI.  Generalized: Well developed, in no acute distress  Via video visit, is alert and oriented, speech is clear and concise, facial symmetry noted, moves about freely  DIAGNOSTIC DATA (LABS, IMAGING, TESTING) - I reviewed patient records, labs, notes, testing and imaging myself where available.  Lab Results  Component Value Date   WBC 6.1 01/23/2023   HGB 16.1 01/23/2023   HCT 48.3 01/23/2023   MCV 89.6 01/23/2023   PLT 225 01/23/2023      Component Value Date/Time   NA 139 01/23/2023 1019   K 4.7 01/23/2023 1019   CL 104 01/23/2023 1019   CO2 26 01/23/2023 1019   GLUCOSE 127 (H) 01/23/2023 1019   BUN 14 01/23/2023 1019   CREATININE 1.04 01/23/2023 1019   CALCIUM  9.9 01/23/2023 1019   PROT 6.7 01/23/2023 1019   PROT 6.3 11/07/2019 0820   ALBUMIN 4.6 11/07/2019 0820   AST 25  01/23/2023 1019   ALT 39 01/23/2023 1019   ALKPHOS 67 11/07/2019 0820   BILITOT 0.6 01/23/2023 1019   BILITOT 0.3 11/07/2019 0820   GFRNONAA 78 06/12/2020 1048   GFRAA 90 06/12/2020 1048   Lab Results  Component Value Date   CHOL 157 01/23/2023   HDL 52 01/23/2023   LDLCALC  01/23/2023     Comment:     . LDL cholesterol not calculated. Triglyceride levels greater than 400 mg/dL invalidate calculated LDL results. . Reference  range: <100 . Desirable range <100 mg/dL for primary prevention;   <70 mg/dL for patients with CHD or diabetic patients  with > or = 2 CHD risk factors. SABRA LDL-C is now calculated using the Martin-Hopkins  calculation, which is a validated novel method providing  better accuracy than the Friedewald equation in the  estimation of LDL-C.  Gladis APPLETHWAITE et al. SANDREA. 7986;689(80): 2061-2068  (http://education.[facility].com/faq/FAQ164)    LDLDIRECT 59 11/07/2019   TRIG 465 (H) 01/23/2023   CHOLHDL 3.0 01/23/2023   Lab Results  Component Value Date   HGBA1C 5.8 (H) 09/18/2022   Lab Results  Component Value Date   VITAMINB12 463 10/23/2014   Lab Results  Component Value Date   TSH 2.41 09/18/2022   Lauraine Born, AGNP-C, DNP 09/02/2023, 10:18 AM [facility] [address], [address] [place], [place] [address] [phone]

## 2023-09-02 NOTE — Patient Instructions (Signed)
 Great to meet you today! Continue using dental device.  Call for any report of apnea or gasping spells or return of snoring I will refill your Ambien .  May consider discussing if primary care is willing to refill.  I have sent refills today.  We will plan to follow-up in 6 months via virtual visit.  Reach out via MyChart if you need anything.  Thanks!!

## 2023-09-10 ENCOUNTER — Ambulatory Visit: Admitting: Family Medicine

## 2023-09-10 ENCOUNTER — Encounter: Payer: Self-pay | Admitting: Family Medicine

## 2023-09-10 VITALS — BP 118/76 | HR 73 | Temp 97.2°F | Resp 18 | Ht 71.0 in | Wt 215.4 lb

## 2023-09-10 DIAGNOSIS — M5410 Radiculopathy, site unspecified: Secondary | ICD-10-CM

## 2023-09-10 DIAGNOSIS — E782 Mixed hyperlipidemia: Secondary | ICD-10-CM | POA: Diagnosis not present

## 2023-09-10 DIAGNOSIS — N401 Enlarged prostate with lower urinary tract symptoms: Secondary | ICD-10-CM

## 2023-09-10 DIAGNOSIS — M48061 Spinal stenosis, lumbar region without neurogenic claudication: Secondary | ICD-10-CM

## 2023-09-10 DIAGNOSIS — I700 Atherosclerosis of aorta: Secondary | ICD-10-CM | POA: Diagnosis not present

## 2023-09-10 DIAGNOSIS — Z0000 Encounter for general adult medical examination without abnormal findings: Secondary | ICD-10-CM | POA: Diagnosis not present

## 2023-09-10 DIAGNOSIS — I2510 Atherosclerotic heart disease of native coronary artery without angina pectoris: Secondary | ICD-10-CM | POA: Diagnosis not present

## 2023-09-10 DIAGNOSIS — Z79899 Other long term (current) drug therapy: Secondary | ICD-10-CM | POA: Diagnosis not present

## 2023-09-10 DIAGNOSIS — R7309 Other abnormal glucose: Secondary | ICD-10-CM

## 2023-09-10 DIAGNOSIS — I10 Essential (primary) hypertension: Secondary | ICD-10-CM

## 2023-09-10 DIAGNOSIS — E559 Vitamin D deficiency, unspecified: Secondary | ICD-10-CM | POA: Diagnosis not present

## 2023-09-10 DIAGNOSIS — K4090 Unilateral inguinal hernia, without obstruction or gangrene, not specified as recurrent: Secondary | ICD-10-CM

## 2023-09-10 DIAGNOSIS — N182 Chronic kidney disease, stage 2 (mild): Secondary | ICD-10-CM

## 2023-09-10 DIAGNOSIS — F4320 Adjustment disorder, unspecified: Secondary | ICD-10-CM

## 2023-09-10 DIAGNOSIS — N138 Other obstructive and reflux uropathy: Secondary | ICD-10-CM

## 2023-09-10 DIAGNOSIS — B370 Candidal stomatitis: Secondary | ICD-10-CM

## 2023-09-10 DIAGNOSIS — E291 Testicular hypofunction: Secondary | ICD-10-CM

## 2023-09-10 DIAGNOSIS — R1319 Other dysphagia: Secondary | ICD-10-CM

## 2023-09-10 LAB — COMPREHENSIVE METABOLIC PANEL WITH GFR
ALT: 22 U/L (ref 0–53)
AST: 19 U/L (ref 0–37)
Albumin: 4.5 g/dL (ref 3.5–5.2)
Alkaline Phosphatase: 64 U/L (ref 39–117)
BUN: 16 mg/dL (ref 6–23)
CO2: 26 meq/L (ref 19–32)
Calcium: 9.2 mg/dL (ref 8.4–10.5)
Chloride: 105 meq/L (ref 96–112)
Creatinine, Ser: 0.84 mg/dL (ref 0.40–1.50)
GFR: 86.65 mL/min (ref >60.00)
Glucose, Bld: 99 mg/dL (ref 70–99)
Potassium: 4.1 meq/L (ref 3.5–5.1)
Sodium: 140 meq/L (ref 135–145)
Total Bilirubin: 0.6 mg/dL (ref 0.2–1.2)
Total Protein: 6.9 g/dL (ref 6.0–8.3)

## 2023-09-10 LAB — LIPID PANEL
Cholesterol: 120 mg/dL (ref 0–200)
HDL: 37.9 mg/dL — ABNORMAL LOW (ref >39.00)
LDL Cholesterol: 21 mg/dL (ref 0–99)
NonHDL: 82.18
Total CHOL/HDL Ratio: 3
Triglycerides: 307 mg/dL — ABNORMAL HIGH (ref 0.0–149.0)
VLDL: 61.4 mg/dL — ABNORMAL HIGH (ref 0.0–40.0)

## 2023-09-10 LAB — CBC WITH DIFFERENTIAL/PLATELET
Basophils Absolute: 0 K/uL (ref 0.0–0.1)
Basophils Relative: 0.6 % (ref 0.0–3.0)
Eosinophils Absolute: 0.3 K/uL (ref 0.0–0.7)
Eosinophils Relative: 5.4 % — ABNORMAL HIGH (ref 0.0–5.0)
HCT: 47.9 % (ref 39.0–52.0)
Hemoglobin: 16.1 g/dL (ref 13.0–17.0)
Lymphocytes Relative: 28.7 % (ref 12.0–46.0)
Lymphs Abs: 1.7 K/uL (ref 0.7–4.0)
MCHC: 33.7 g/dL (ref 30.0–36.0)
MCV: 84.9 fl (ref 78.0–100.0)
Monocytes Absolute: 0.5 K/uL (ref 0.1–1.0)
Monocytes Relative: 9.3 % (ref 3.0–12.0)
Neutro Abs: 3.2 K/uL (ref 1.4–7.7)
Neutrophils Relative %: 56 % (ref 43.0–77.0)
Platelets: 225 K/uL (ref 150.0–400.0)
RBC: 5.65 Mil/uL (ref 4.22–5.81)
RDW: 12.5 % (ref 11.5–15.5)
WBC: 5.8 K/uL (ref 4.0–10.5)

## 2023-09-10 LAB — VITAMIN D 25 HYDROXY (VIT D DEFICIENCY, FRACTURES): VITD: 33.92 ng/mL (ref 30.00–100.00)

## 2023-09-10 LAB — TESTOSTERONE: Testosterone: 366.76 ng/dL (ref 300.00–890.00)

## 2023-09-10 LAB — MAGNESIUM: Magnesium: 2.4 mg/dL (ref 1.5–2.5)

## 2023-09-10 LAB — HEMOGLOBIN A1C: Hgb A1c MFr Bld: 6.2 % (ref 4.6–6.5)

## 2023-09-10 LAB — TSH: TSH: 3.24 u[IU]/mL (ref 0.35–5.50)

## 2023-09-10 LAB — PSA: PSA: 0.72 ng/mL (ref 0.10–4.00)

## 2023-09-10 MED ORDER — ESOMEPRAZOLE MAGNESIUM 40 MG PO CPDR: 40.0000 mg | DELAYED_RELEASE_CAPSULE | Freq: Every day | ORAL | 3 refills | Status: DC

## 2023-09-10 MED ORDER — FLUCONAZOLE 100 MG PO TABS: 100.0000 mg | ORAL_TABLET | ORAL | 11 refills | Status: AC

## 2023-09-10 NOTE — Progress Notes (Signed)
 Assessment & Plan   Assessment/Plan:    Assessment & Plan Coronary artery disease Coronary artery disease with a stent in the circumflex artery post-myocardial infarction at age 73. No chest pain or nitroglycerin  use since the event. On clopidogrel  for antiplatelet therapy. Discussed the interactions with nitrates and not to take nitroglycerin  if he has taken the tadalafil .  Patient aware of this risk and states that he will.  Has not taken nitroglycerin  for chest pain in greater than 10 years and he has no chest pain at this time.  He is active works out regularly and has no dyspnea or shortness of breath - Refill clopidogrel  75 mg daily - Refill nitroglycerin  0.4 mg sublingual tablets  Hypertension Hypertension well-controlled with telmisartan  80 mg and nebivolol  10 mg. Home monitoring shows expected post-exercise elevation. - Refill telmisartan  80 mg - Refill nebivolol  10 mg  Hyperlipidemia Hyperlipidemia managed with rosuvastatin  20 mg and Zetia  10 mg. Previously switched from Lipitor to rosuvastatin . - Refill rosuvastatin  20 mg - Refill Zetia  10 mg  Erectile dysfunction and BPH with LUTS Erectile dysfunction likely vascular in origin. Managed with tadalafil  20 mg as needed. Discussed potential benefits for urinary symptoms related to enlarged prostate. Tadalafil  may benefit both conditions. - Prescribe tadalafil  20 mg daily for erectile dysfunction and potential enlarged prostate - Monitor urinary symptoms  Rght inguinal hernia Umbilical hernia with occasional gurgling sounds. No acute symptoms. - Refer to general surgery for evaluation  Insomnia with OSA Chronic insomnia with difficulty initiating and maintaining sleep. Previously managed with zolpidem . Intolerant to CPAP for sleep apnea; using a mouth guard with some improvement. Discussed zolpidem  risks including dementia and dependence. - Continue zolpidem  as previously prescribed - Discuss alternative insomnia treatments  at next visit   Adult Wellness Visit Annual physical examination conducted. Blood pressure well controlled at 118/76 mmHg. No chest pain, shortness of breath, significant weight loss, fevers, chills, nausea, vomiting, or blood in stool. Occasional constipation. No significant abdominal pain or swelling. Dermatological examination revealed multiple skin lesions with a history of squamous cell carcinoma. Regular dermatological follow-up in place. - Perform complete metabolic panel to assess renal function and electrolytes - Order CBC with differential - Order fasting lipid panel - Order PSA and testosterone  levels - Order TSH - Order vitamin D  level - Order random insulin  and hemoglobin A1c - Order baseline EKG - Test for H. pylori - Prescribe fluconazole  for oral candidiasis - Prescribe esomeprazole  40 mg daily for GERD - Refer to gastroenterology for evaluation of dysphagia - Discuss lifestyle modifications including exercise and diet  Atherosclerotic heart disease of native coronary artery and aortic/iliac atherosclerosis Coronary artery disease and aortic/iliac atherosclerosis managed with clopidogrel . No recent chest pain or shortness of breath. Last ECG in 2020 showed normal sinus rhythm. Cardiologist follow-up scheduled for September 30th. - Continue clopidogrel  75 mg daily - Follow up with cardiologist on September 30th  Essential hypertension Hypertension well controlled with current medication regimen. Blood pressure recorded at 118/76 mmHg. - Continue telmisartan  80 mg daily - Continue nebivolol  10 mg daily  Mixed hyperlipidemia Managed with rosuvastatin  and ezetimibe . Lipid panel to be checked to assess current status. - Continue rosuvastatin  20 mg daily - Continue ezetimibe  10 mg daily - Order fasting lipid panel  Chronic kidney disease, stage 2 Chronic kidney disease stage 2. Renal function to be assessed with complete metabolic panel. - Order complete metabolic panel  to assess renal function  Benign prostatic hyperplasia with lower urinary tract symptoms BPH with lower  urinary tract symptoms managed with tadalafil  as needed. PSA to be checked. - Order PSA level - Continue tadalafil  20 mg as needed  Testicular hypofunction and erectile dysfunction Testosterone  levels to be checked due to reported decreased sexual interest. Erectile dysfunction managed with tadalafil . - Order testosterone  level - Continue tadalafil  20 mg as needed  Vitamin D  deficiency Vitamin D  levels to be checked to assess current status. - Order vitamin D  level  Obesity Obesity with a BMI of 30. Lifestyle modifications discussed for weight management and cardiovascular health. - Discuss lifestyle modifications including exercise and diet  Radiculopathy and lumbar spinal stenosis Occasional radiculopathy managed with gabapentin . No acute changes reported. - Continue gabapentin  600 mg TID  Other abnormal glucose History of hyperglycemia. Random insulin  and hemoglobin A1c to be checked to assess glucose metabolism. - Order random insulin  and hemoglobin A1c  Insomnia and sleep apnea intolerance Insomnia managed with zolpidem  as needed. CPAP previously intolerant, currently using oral device for sleep apnea. - Continue zolpidem  5 mg as needed  Oral candidiasis (thrush) Recent dental work and history of thrush. White patches on tongue suggestive of oral candidiasis. Fluconazole  preferred over nystatin  rinse. - Prescribe fluconazole  150 mg as needed  Gastroesophageal reflux disease with dysphagia Intermittent dysphagia and reflux symptoms. Possible esophageal stricture considered. Referral to gastroenterology for further evaluation. H. pylori testing and esomeprazole  initiated as interim management. - Test for H. pylori - Prescribe esomeprazole  40 mg daily - Refer to gastroenterology for evaluation of dysphagia  Inguinal hernia, right side Right inguinal hernia present. No  significant pain reported. Watchful waiting approach discussed. Referral to surgery considered if symptoms worsen. - Consider referral to surgery if symptoms worsen  Herpes labialis Occasional herpes labialis managed with valacyclovir . - Continue valacyclovir  as needed      Medications Discontinued During This Encounter  Medication Reason   fluconazole  (DIFLUCAN ) 100 MG tablet Reorder     Return in about 6 months (around 03/09/2024) for BP, HLD.        Subjective:   Encounter date: 09/10/2023  TIP ATIENZA is a 73 y.o. male who has Hyperlipidemia, mixed; Essential hypertension; Vitamin D  deficiency; Abnormal glucose; Antiplatelet or antithrombotic long-term use; Coronary artery disease involving native coronary artery of native heart without angina pectoris; History of PSVT; Symptomatic PVCs; CKD (chronic kidney disease) stage 2, GFR 60-89 ml/min; Obesity (BMI 30.0-34.9); Insomnia; ED (erectile dysfunction); Aorto-iliac atherosclerosis (HCC) by Pelvic CT scan 03/2016; Psychophysiological insomnia; Dental anomaly; Severe obstructive sleep apnea-hypopnea syndrome; OSA on CPAP; Moderate obstructive sleep apnea-hypopnea syndrome; Intolerance of continuous positive airway pressure (CPAP) ventilation; Short sleeper; Dependence on CPAP ventilation; History of colonic polyps; Chronic intermittent hypoxia with obstructive sleep apnea; BPH with obstruction/lower urinary tract symptoms; Right inguinal hernia; Hypogonadism in male; Esophageal dysphagia; Adjustment disorder; and Thrush on their problem list..   He  has a past medical history of Allergy (50 yrs), Anxiety, Arthritis, CAD (coronary artery disease), native coronary artery, Cardiac arrhythmia due to congenital heart disease, Colon polyps, Diverticulosis, GERD (gastroesophageal reflux disease), Hernia, History of PSVT, Hyperlipidemia, Hypertension, Myocardial infarction (HCC) (04/19/2001), Sleep apnea (2015), Symptomatic PVCs, and Vitamin D   deficiency.SABRA   He presents with chief complaint of Annual Exam (Pt is fasting today //HM- shingles and flu vaccine (patient declined) ) and Swallowed Foreign Body (Pt c/o of esophagus blockage; he noticed a few years ago, with family HX (son). /) .   Discussed the use of AI scribe software for clinical note transcription with the patient, who gave verbal  consent to proceed.  History of Present Illness Bryan [name] is a 73 year old male with coronary artery disease and high cholesterol who presents for a transition of care visit and medication refills.  He has a history of coronary artery disease, having suffered a myocardial infarction at age 2, which required a stent placement in the circumflex artery. He has been on clopidogrel  75 mg daily for 20 years and has experienced no angina since the myocardial infarction. He occasionally forgets to carry his nitroglycerin  but has never needed to use it. He is also on rosuvastatin  20 mg and ezetimibe  10 mg for hyperlipidemia management.  He manages hypertension with telmisartan  80 mg and nebivolol  10 mg daily. He monitors his blood pressure at home, which generally remains stable around 118/70 mmHg, though it rises during physical activity.  He experiences erectile dysfunction, for which he takes tadalafil  20 mg every other day as needed. He reports a weak urinary stream.  He has a history of sleep apnea and insomnia, for which he uses zolpidem  for sleep. He has tried CPAP but was intolerant, and currently uses a mandibular advancement device. He has explored various non-pharmacological methods to improve sleep without success.  He reports a hernia on the right side, which occasionally gurgles, and is seeking a referral for surgical evaluation.   Hoy Fallert Cristo Ausburn is a 73 year old male who presents for an annual physical exam.  He experiences episodes of dysphagia where food gets stuck in his throat, requiring him to relax and sometimes  resulting in regurgitation. These episodes have increased in frequency over the past two years, with two or three episodes in the last several months. He also experienced an episode of gastroesophageal reflux last night, which is uncommon for him. His son has a history of esophageal stricture requiring dilation, raising concerns about similar issues.  He has undergone significant dental work recently, including an implant, root canal, and replacement of six crowns. He has noticed a white coating on his tongue, which he associates with previous episodes of oral thrush treated with fluconazole .  He mentions occasional constipation but denies unintentional weight loss, fevers, chills, nausea, vomiting, or hematochezia. He experiences ankle edema when at the beach, which he attributes to walking on sand.  He has a small right-sided hernia that becomes noticeable when lifting heavy weights, but it is not painful. He has reduced his weightlifting to avoid exacerbating the hernia.  He reports decreased libido and overall motivation and is interested in the results of his testosterone  test. He is interested in the results of his testosterone  test.     ROS  Past Surgical History:  Procedure Laterality Date   ACHILLES TENDON REPAIR     left   APPENDECTOMY  50 years ago   CARDIAC ELECTROPHYSIOLOGY MAPPING AND ABLATION     COLONOSCOPY     CORONARY ANGIOPLASTY WITH STENT PLACEMENT     years ago   ROOT CANAL     August 2025   teeth crown replacement      Outpatient Medications Prior to Visit  Medication Sig Dispense Refill   azelastine  (ASTELIN ) 0.1 % nasal spray Place 1 spray into both nostrils 2 (two) times daily. Use in each nostril as directed 30 mL 1   clopidogrel  (PLAVIX ) 75 MG tablet TAKE ONE TABLET BY MOUTH DAILY TO PREVENT BLOOD CLOTS 90 tablet 1   ezetimibe  (ZETIA ) 10 MG tablet Take  1 tablet  Daily  for Cholesterol 90 tablet  3   gabapentin  (NEURONTIN ) 600 MG tablet TAKE 1/2 TO 1 TABLET  BY MOUTH 3 TIMES A DAY AS NEEDED FOR PAIN 270 tablet 0   nebivolol  (BYSTOLIC ) 10 MG tablet TAKE ONE TABLET BY MOUTH DAILY FOR BLOOD PRESSURE 90 tablet 3   nitroGLYCERIN  (NITROSTAT ) 0.4 MG SL tablet Dissolve 1 tablet under tongue every 3 to 5 minutes if needed for Angina 50 tablet 11   rosuvastatin  (CRESTOR ) 20 MG tablet Take  1 tablet Daily for  Cholesterol 90 tablet 3   tadalafil  (CIALIS ) 20 MG tablet Take 1 tablet (20 mg total) by mouth daily. 90 tablet 3   telmisartan  (MICARDIS ) 80 MG tablet Take  1 tablet  Daily  for BP 90 tablet 3   valACYclovir  (VALTREX ) 500 MG tablet Take 1 tablet 2 x /day for Fever Blisters / Cold Sores 180 tablet 1   zolpidem  (AMBIEN ) 5 MG tablet TAKE ONE TABLET BY MOUTH EVERY EVENING AS NEEDED FOR SLEEP 30 tablet 5   fluconazole  (DIFLUCAN ) 100 MG tablet      No facility-administered medications prior to visit.    Family History  Problem Relation Age of Onset   Hypothyroidism Mother    Irritable bowel syndrome Mother    Colonic polyp Mother        PRE-malignant polyp of rectosigmoid colon   Colon polyps Mother    Arthritis Mother    COPD Mother    Brain cancer Father    Cancer Father    Early death Father    Alcohol abuse Sister    Celiac disease Brother    Heart disease Maternal Grandfather    Hypertension Maternal Grandmother    Asthma Paternal Grandfather    Early death Paternal Grandfather    Heart disease Paternal Grandfather    Anxiety disorder Paternal Aunt    Depression Paternal Aunt    Early death Sister    Cancer Maternal Aunt    Cancer Maternal Aunt    Cancer Paternal Uncle    Heart disease Paternal Uncle    Esophageal cancer Neg Hx    Stomach cancer Neg Hx    Pancreatic cancer Neg Hx    Liver disease Neg Hx    Colon cancer Neg Hx    Rectal cancer Neg Hx     Social History   Socioeconomic History   Marital status: Married    Spouse name: Not on file   Number of children: 3   Years of education: Not on file   Highest education  level: Some college, no degree  Occupational History   Occupation: retired  Tobacco Use   Smoking status: Never   Smokeless tobacco: Never  Vaping Use   Vaping status: Never Used  Substance and Sexual Activity   Alcohol use: Not Currently    Alcohol/week: 1.0 standard drink of alcohol    Types: 1 Glasses of wine per week    Comment: rarely   Drug use: No   Sexual activity: Not Currently    Birth control/protection: None    Comment: Vasectomy  Other Topics Concern   Not on file  Social History Narrative   Drinks 2 cups of coffee a day    Social Drivers of Corporate investment banker Strain: Low Risk  (09/04/2023)   Overall Financial Resource Strain (CARDIA)    Difficulty of Paying Living Expenses: Not very hard  Food Insecurity: No Food Insecurity (09/04/2023)   Hunger Vital Sign    Worried About Running Out of  Food in the Last Year: Never true    Ran Out of Food in the Last Year: Never true  Transportation Needs: No Transportation Needs (09/04/2023)   PRAPARE - Administrator, Civil Service (Medical): No    Lack of Transportation (Non-Medical): No  Physical Activity: Sufficiently Active (09/04/2023)   Exercise Vital Sign    Days of Exercise per Week: 4 days    Minutes of Exercise per Session: 40 min  Stress: No Stress Concern Present (09/04/2023)   [employer] of Occupational Health - Occupational Stress Questionnaire    Feeling of Stress: Only a little  Social Connections: Socially Integrated (09/04/2023)   Social Connection and Isolation Panel    Frequency of Communication with Friends and Family: More than three times a week    Frequency of Social Gatherings with Friends and Family: Twice a week    Attends Religious Services: More than 4 times per year    Active Member of [employer] or Organizations: Yes    Attends Engineer, structural: More than 4 times per year    Marital Status: Married  Catering manager Violence: Not on file                                                                                                   Objective:  Physical Exam: BP 118/76 (BP Location: Left Arm, Patient Position: Sitting, Cuff Size: Normal)   Pulse 73   Temp (!) 97.2 F (36.2 C) (Temporal)   Resp 18   Ht 5' 11 (1.803 m)   Wt 215 lb 6.4 oz (97.7 kg)   SpO2 96%   BMI 30.04 kg/m    Physical Exam  GENERAL: Alert, cooperative, well developed, no acute distress. HEENT: Normocephalic, normal oropharynx, moist mucous membranes, plaque buildup on tongue, cerumen in right ear. CHEST: Clear to auscultation bilaterally, no wheezes, rhonchi, or crackles. CARDIOVASCULAR: Normal heart rate and rhythm, S1 and S2 normal without murmurs. ABDOMEN: Soft, non-tender, non-distended, without organomegaly, normal bowel sounds. EXTREMITIES: No cyanosis or edema. NEUROLOGICAL: Cranial nerves grossly intact, moves all extremities without gross motor or sensory deficit.     No results found.        Beverley Adine Hummer, MD, MS

## 2023-09-10 NOTE — Patient Instructions (Addendum)
 VISIT SUMMARY: Today, you had your annual physical exam. We discussed several health concerns, including your episodes of difficulty swallowing, recent dental work, occasional constipation, and decreased libido. We also reviewed your history of heart disease, high blood pressure, high cholesterol, chronic kidney disease, benign prostatic hyperplasia, and other conditions. Your blood pressure is well controlled, and we have ordered several tests to monitor your overall health. We also discussed lifestyle modifications to help manage your weight and improve your cardiovascular health.  YOUR PLAN: -ADULT WELLNESS VISIT: Your annual physical exam was conducted, and your blood pressure is well controlled. We have ordered several tests, including a complete metabolic panel, CBC, fasting lipid panel, PSA, testosterone  levels, TSH, vitamin D  level, random insulin , hemoglobin A1c. We also tested for H. pylori and prescribed fluconazole  for oral thrush and esomeprazole  for acid reflux. A referral to gastroenterology was made for your swallowing difficulties. Lifestyle modifications, including exercise and diet, were discussed.  -ATHEROSCLEROTIC HEART DISEASE OF NATIVE CORONARY ARTERY AND AORTIC/ILIAC ATHEROSCLEROSIS: This condition involves the hardening and narrowing of your arteries. You should continue taking clopidogrel  75 mg daily and follow up with your cardiologist on September 30th.  -ESSENTIAL HYPERTENSION: Your high blood pressure is well controlled with your current medications. Continue taking telmisartan  80 mg daily and nebivolol  10 mg daily.  -MIXED HYPERLIPIDEMIA: This condition involves having high levels of different types of fats in your blood. Continue taking rosuvastatin  20 mg daily and ezetimibe  10 mg daily. We have ordered a fasting lipid panel to check your current status.  -CHRONIC KIDNEY DISEASE, STAGE 2: This is a mild form of kidney disease. We have ordered a complete metabolic panel  to assess your kidney function.  -BENIGN PROSTATIC HYPERPLASIA WITH LOWER URINARY TRACT SYMPTOMS: This condition involves the enlargement of your prostate gland, causing urinary symptoms. We have ordered a PSA level and you should continue taking tadalafil  20 mg as needed.  -TESTICULAR HYPOFUNCTION AND ERECTILE DYSFUNCTION: This condition involves low testosterone  levels and difficulty achieving an erection. We have ordered a testosterone  level test and you should continue taking tadalafil  20 mg as needed.  -VITAMIN D  DEFICIENCY: This means you have low levels of vitamin D . We have ordered a vitamin D  level test to assess your current status.  -OBESITY: This means you have a high body mass index (BMI). We discussed lifestyle modifications, including exercise and diet, to help manage your weight and improve your cardiovascular health.  -RADICULOPATHY AND LUMBAR SPINAL STENOSIS: This condition involves nerve pain and narrowing of the spinal canal in your lower back. Continue taking gabapentin  600 mg three times  a day as needed.  -OTHER ABNORMAL GLUCOSE: This means you have a history of high blood sugar levels. We have ordered random insulin  and hemoglobin A1c tests to assess your glucose metabolism.  -INSOMNIA AND SLEEP APNEA INTOLERANCE: This means you have difficulty sleeping and have not tolerated CPAP for sleep apnea. Continue taking zolpidem  5 mg as needed and use your oral device for sleep apnea.  -ORAL CANDIDIASIS (THRUSH): This is a fungal infection in your mouth. We have prescribed fluconazole  100 mg as needed.  -GASTROESOPHAGEAL REFLUX DISEASE WITH DYSPHAGIA: This condition involves acid reflux and difficulty swallowing. We have tested for H. pylori, prescribed esomeprazole  40 mg daily, and referred you to gastroenterology for further evaluation.  -INGUINAL HERNIA, RIGHT SIDE: This is a hernia in your groin area. We discussed a watchful waiting approach and will consider a referral to  surgery if your symptoms worsen.  -  HERPES LABIALIS: This is a condition where cold sores appear on your lips. Continue taking valacyclovir  as needed.  INSTRUCTIONS: Please follow up with your cardiologist on September 30th. Additionally, we have referred you to gastroenterology for further evaluation of your swallowing difficulties. Continue with your current medications and lifestyle modifications as discussed. We will contact you with the results of your tests once they are available.

## 2023-09-14 LAB — URINALYSIS W MICROSCOPIC + REFLEX CULTURE
Bacteria, UA: NONE SEEN /HPF
Bilirubin Urine: NEGATIVE
Glucose, UA: NEGATIVE
Hgb urine dipstick: NEGATIVE
Hyaline Cast: NONE SEEN /LPF
Ketones, ur: NEGATIVE
Leukocyte Esterase: NEGATIVE
Nitrites, Initial: NEGATIVE
Protein, ur: NEGATIVE
RBC / HPF: NONE SEEN /HPF (ref 0–2)
Specific Gravity, Urine: 1.024 (ref 1.001–1.035)
Squamous Epithelial / HPF: NONE SEEN /HPF (ref <=5)
WBC, UA: NONE SEEN /HPF (ref 0–5)
pH: 5.5 (ref 5.0–8.0)

## 2023-09-14 LAB — INSULIN, RANDOM: Insulin: 28 u[IU]/mL — ABNORMAL HIGH

## 2023-09-14 LAB — NO CULTURE INDICATED

## 2023-09-14 LAB — H. PYLORI BREATH TEST: H. pylori Breath Test: NOT DETECTED

## 2023-09-15 ENCOUNTER — Ambulatory Visit: Payer: Self-pay | Admitting: Family Medicine

## 2023-09-15 DIAGNOSIS — E291 Testicular hypofunction: Secondary | ICD-10-CM | POA: Insufficient documentation

## 2023-09-15 DIAGNOSIS — B370 Candidal stomatitis: Secondary | ICD-10-CM | POA: Insufficient documentation

## 2023-09-15 DIAGNOSIS — R1319 Other dysphagia: Secondary | ICD-10-CM | POA: Insufficient documentation

## 2023-09-15 DIAGNOSIS — F4320 Adjustment disorder, unspecified: Secondary | ICD-10-CM | POA: Insufficient documentation

## 2023-10-06 ENCOUNTER — Ambulatory Visit: Attending: Cardiovascular Disease | Admitting: Cardiovascular Disease

## 2023-10-06 ENCOUNTER — Encounter: Payer: Self-pay | Admitting: Cardiovascular Disease

## 2023-10-06 VITALS — BP 112/72 | HR 71 | Ht 71.0 in | Wt 214.0 lb

## 2023-10-06 DIAGNOSIS — E785 Hyperlipidemia, unspecified: Secondary | ICD-10-CM

## 2023-10-06 DIAGNOSIS — I493 Ventricular premature depolarization: Secondary | ICD-10-CM

## 2023-10-06 DIAGNOSIS — I25118 Atherosclerotic heart disease of native coronary artery with other forms of angina pectoris: Secondary | ICD-10-CM | POA: Diagnosis not present

## 2023-10-06 DIAGNOSIS — I10 Essential (primary) hypertension: Secondary | ICD-10-CM

## 2023-10-06 MED ORDER — ICOSAPENT ETHYL 1 G PO CAPS: 2.0000 g | ORAL_CAPSULE | Freq: Two times a day (BID) | ORAL | 3 refills | Status: DC

## 2023-10-06 NOTE — Assessment & Plan Note (Addendum)
 Blood pressure controlled.  Creatinine 0.8, potassium 4.1.  Continue telmisartan .

## 2023-10-06 NOTE — Assessment & Plan Note (Signed)
 Heart monitor 2022 showed a PVC burden of approximately 7%.  Patient treated with a beta-blocker.

## 2023-10-06 NOTE — Patient Instructions (Addendum)
 Medication Instructions:  START Vascepa 2g twice daily   *If you need a refill on your cardiac medications before your next appointment, please call your pharmacy*  Lab Work: None ordered today. If you have labs (blood work) drawn today and your tests are completely normal, you will receive your results only by: MyChart Message (if you have MyChart) OR A paper copy in the mail If you have any lab test that is abnormal or we need to change your treatment, we will call you to review the results.  Testing/Procedures: Your physician has requested that you have en exercise stress myoview. For further information please visit [email or web address] Please follow instruction sheet, as given.   Follow-Up: At [facility], you and your health needs are our priority.  As part of our continuing mission to provide you with exceptional heart care, our providers are all part of one team.  This team includes your primary Cardiologist (physician) and Advanced Practice Providers or APPs (Physician Assistants and Nurse Practitioners) who all work together to provide you with the care you need, when you need it.  Your next appointment:   1 year(s)  Provider:   Ozell Fell, MD    We recommend signing up for the patient portal called MyChart.  Sign up information is provided on this After Visit Summary.  MyChart is used to connect with patients for Virtual Visits (Telemedicine).  Patients are able to view lab/test results, encounter notes, upcoming appointments, etc.  Non-urgent messages can be sent to your provider as well.   To learn more about what you can do with MyChart, go to [email or web address].   Other Instructions Exercise Myoview (Stress Test) Instructions  Please arrive 15 minutes prior to your appointment time for registration and insurance purposes.   The test will take approximately 3 to 4 hours to complete; you may bring reading material.  If someone comes with you to your  appointment, they will need to remain in the main lobby due to limited space in the testing area. **If you are pregnant or breastfeeding, please notify the nuclear lab prior to your appointment**   How to prepare for your Myocardial Perfusion Test: Do not eat or drink 3 hours prior to your test, except you may have water. Do not consume products containing caffeine (regular or decaffeinated) 12 hours prior to your test. (ex: coffee, chocolate, sodas, tea). Do bring a list of your current medications with you.  If not listed below, you may take your medications as normal. Do wear comfortable clothes (no dresses or overalls) and walking shoes, tennis shoes preferred (No heels or open toe shoes are allowed). Do NOT wear cologne, perfume, aftershave, or lotions (deodorant is allowed). If these instructions are not followed, your test will have to be rescheduled.   Please report to [address] [place], [place] [address] for your test.  If you have questions or concerns about your appointment, you can call the Nuclear Lab at [phone].   If you cannot keep your appointment, please provide 24 hours notification to the Nuclear Lab, to avoid a possible $50 charge to your account.

## 2023-10-06 NOTE — Assessment & Plan Note (Addendum)
 Remote bare-metal stenting to an obtuse marginal, moderate RCA stenosis at time of last heart catheterization.  Patient with typical and atypical symptoms of angina/dyspnea.  Recommend exercise stress Myoview for further evaluation.  Continue clopidogrel , Nebivolol , and rosuvastatin .

## 2023-10-06 NOTE — Progress Notes (Signed)
 Cardiology Office Note:    Date:  10/06/2023   ID:  Stevin, Bielinski 1950/03/21, MRN [id number]  PCP:  Sebastian Beverley NOVAK, MD   [facility] Providers Cardiologist:  Ozell Fell, MD     Referring MD: Sebastian Beverley NOVAK, MD   Chief Complaint  Patient presents with   Coronary Artery Disease    History of Present Illness:    Bryan [name] is a 73 y.o. male with a hx of:  Coronary artery disease ACS in 2003 s/p (3.5 x 18 mm) BMS to OM1  Residual disease: Severe apical LAD, moderate RCA 60-70 with associated aneurysmal dilation Echo 10/2020: EF 60-65, trivial MR Supraventricular Tachycardia  S/p RFA at [facility] in the early 2000's PVCs Monitor 11/2020: Frequent PVCs (burden 6.7%) Hypertension Hyperlipidemia  The patient is here alone.  He has noticed an increase in shortness of breath with climbing stairs.  When he walks on level ground he has no symptoms.  He has also noticed pain in the left upper chest and sometimes into the left upper arm that can occur at rest or with certain activities.  Is not consistently related to physical exertion and feels like an ache.  He denies heart palpitations, edema, lightheadedness.  No other complaints.  Current Medications: Current Meds  Medication Sig   azelastine  (ASTELIN ) 0.1 % nasal spray Place 1 spray into both nostrils 2 (two) times daily. Use in each nostril as directed   clopidogrel  (PLAVIX ) 75 MG tablet TAKE ONE TABLET BY MOUTH DAILY TO PREVENT BLOOD CLOTS   esomeprazole  (NEXIUM ) 40 MG capsule Take 1 capsule (40 mg total) by mouth daily.   ezetimibe  (ZETIA ) 10 MG tablet Take  1 tablet  Daily  for Cholesterol   icosapent Ethyl (VASCEPA) 1 g capsule Take 2 capsules (2 g total) by mouth 2 (two) times daily.   nebivolol  (BYSTOLIC ) 10 MG tablet TAKE ONE TABLET BY MOUTH DAILY FOR BLOOD PRESSURE   nitroGLYCERIN  (NITROSTAT ) 0.4 MG SL tablet Dissolve 1 tablet under tongue every 3 to 5 minutes if needed for Angina    rosuvastatin  (CRESTOR ) 20 MG tablet Take  1 tablet Daily for  Cholesterol   tadalafil  (CIALIS ) 20 MG tablet Take 1 tablet (20 mg total) by mouth daily.   telmisartan  (MICARDIS ) 80 MG tablet Take  1 tablet  Daily  for BP   valACYclovir  (VALTREX ) 500 MG tablet Take 1 tablet 2 x /day for Fever Blisters / Cold Sores   zolpidem  (AMBIEN ) 5 MG tablet TAKE ONE TABLET BY MOUTH EVERY EVENING AS NEEDED FOR SLEEP     Allergies:   Lisinopril , Amoxicillin, and Penicillins   ROS:   Please see the history of present illness.    All other systems reviewed and are negative.  EKGs/Labs/Other Studies Reviewed:    The following studies were reviewed today: Cardiac Studies & Procedures   ______________________________________________________________________________________________   STRESS TESTS  MYOCARDIAL PERFUSION IMAGING 06/18/2008   ECHOCARDIOGRAM  ECHOCARDIOGRAM COMPLETE 10/23/2020  Narrative ECHOCARDIOGRAM REPORT    Patient Name:   Bryan [name] [facility] Date of Exam: 10/23/2020 Medical Rec #:  [id number]     Height:       70.0 in Accession #:    [id number]    Weight:       217.0 lb Date of Birth:  July 07, 1950     BSA:          2.161 m Patient Age:    70 years      BP:  108/72 mmHg Patient Gender: M             HR:           85 bpm. Exam Location:  [address]  Procedure: 2D Echo, 3D Echo, Cardiac Doppler, Color Doppler and Strain Analysis  Indications:    R06.02 Shortness of breath  History:        Patient has no prior history of Echocardiogram examinations. CAD, Arrythmias:PSVT and PVC; Risk Factors:Hypertension, Sleep Apnea and Dyslipidemia. CKD stage 2. Obesity.  Sonographer:    Marshia Lawyer BS, RDCS Referring Phys: [phone] Gricel Copen  IMPRESSIONS   1. Left ventricular ejection fraction, by estimation, is 60 to 65%. The left ventricle has normal function. The left ventricle has no regional wall motion abnormalities. Left ventricular diastolic parameters were normal. 2.  Right ventricular systolic function is normal. The right ventricular size is normal. Tricuspid regurgitation signal is inadequate for assessing PA pressure. 3. The mitral valve is grossly normal. Trivial mitral valve regurgitation. No evidence of mitral stenosis. 4. The aortic valve is tricuspid. There is mild thickening of the aortic valve. Aortic valve regurgitation is not visualized. No aortic stenosis is present. 5. The inferior vena cava is normal in size with greater than 50% respiratory variability, suggesting right atrial pressure of 3 mmHg.  Conclusion(s)/Recommendation(s): Normal biventricular function without evidence of hemodynamically significant valvular heart disease.  FINDINGS Left Ventricle: Left ventricular ejection fraction, by estimation, is 60 to 65%. The left ventricle has normal function. The left ventricle has no regional wall motion abnormalities. Global longitudinal strain performed but not reported based on interpreter judgement due to suboptimal tracking. The left ventricular internal cavity size was normal in size. There is no left ventricular hypertrophy. Left ventricular diastolic parameters were normal.  Right Ventricle: The right ventricular size is normal. No increase in right ventricular wall thickness. Right ventricular systolic function is normal. Tricuspid regurgitation signal is inadequate for assessing PA pressure.  Left Atrium: Left atrial size was normal in size.  Right Atrium: Right atrial size was normal in size.  Pericardium: There is no evidence of pericardial effusion. Presence of pericardial fat pad.  Mitral Valve: The mitral valve is grossly normal. Trivial mitral valve regurgitation. No evidence of mitral valve stenosis.  Tricuspid Valve: The tricuspid valve is grossly normal. Tricuspid valve regurgitation is not demonstrated. No evidence of tricuspid stenosis.  Aortic Valve: The aortic valve is tricuspid. There is mild thickening of the aortic  valve. Aortic valve regurgitation is not visualized. No aortic stenosis is present.  Pulmonic Valve: The pulmonic valve was grossly normal. Pulmonic valve regurgitation is not visualized. No evidence of pulmonic stenosis.  Aorta: The aortic root and ascending aorta are structurally normal, with no evidence of dilitation.  Venous: The right lower pulmonary vein is normal. The inferior vena cava is normal in size with greater than 50% respiratory variability, suggesting right atrial pressure of 3 mmHg.  IAS/Shunts: The atrial septum is grossly normal.   LEFT VENTRICLE PLAX 2D LVIDd:         5.00 cm   Diastology LVIDs:         3.20 cm   LV e' medial:    7.58 cm/s LV PW:         0.70 cm   LV E/e' medial:  9.2 LV IVS:        0.80 cm   LV e' lateral:   11.30 cm/s LVOT diam:     2.20 cm   LV  E/e' lateral: 6.2 LV SV:         85 LV SV Index:   39        2D Longitudinal Strain LVOT Area:     3.80 cm  2D Strain GLS (A2C):   -19.5 % 2D Strain GLS (A3C):   -15.2 % 2D Strain GLS (A4C):   -18.4 % 2D Strain GLS Avg:     -17.7 %  3D Volume EF: 3D EF:        56 % LV EDV:       110 ml LV ESV:       48 ml LV SV:        61 ml  RIGHT VENTRICLE             IVC RV Basal diam:  3.30 cm     IVC diam: 1.70 cm RV S prime:     11.30 cm/s TAPSE (M-mode): 2.2 cm  LEFT ATRIUM             Index        RIGHT ATRIUM           Index LA diam:        3.40 cm 1.57 cm/m   RA Pressure: 3.00 mmHg LA Vol (A2C):   30.3 ml 14.02 ml/m  RA Area:     15.50 cm LA Vol (A4C):   33.0 ml 15.27 ml/m  RA Volume:   38.40 ml  17.77 ml/m LA Biplane Vol: 32.1 ml 14.85 ml/m AORTIC VALVE LVOT Vmax:   114.00 cm/s LVOT Vmean:  76.500 cm/s LVOT VTI:    0.223 m  AORTA Ao Root diam: 3.40 cm Ao Asc diam:  3.50 cm  MITRAL VALVE               TRICUSPID VALVE Estimated RAP:  3.00 mmHg MV Decel Time: 176 msec MV E velocity: 69.70 cm/s  SHUNTS MV A velocity: 99.60 cm/s  Systemic VTI:  0.22 m MV E/A ratio:  0.70         Systemic Diam: 2.20 cm  Darryle Decent MD Electronically signed by Darryle Decent MD Signature Date/Time: 10/23/2020/3:22:08 PM    Final    MONITORS  LONG TERM MONITOR (3-14 DAYS) 10/31/2020  Narrative Patch Wear Time:  9 days and 23 hours (2022-10-04T20:26:21-0400 to 2022-10-14T20:13:29-0400)  Patient had a min HR of 54 bpm, max HR of 203 bpm, and avg HR of 78 bpm. Predominant underlying rhythm was Sinus Rhythm. 2 Ventricular Tachycardia runs occurred, the run with the fastest interval lasting 4 beats with a max rate of 182 bpm, the longest lasting 4 beats with an avg rate of 101 bpm. 12 Supraventricular Tachycardia runs occurred, the run with the fastest interval lasting 5 beats with a max rate of 203 bpm, the longest lasting 19 beats with an avg rate of 122 bpm. Isolated SVEs were rare (<1.0%), SVE Couplets were rare (<1.0%), and SVE Triplets were rare (<1.0%). Isolated VEs were frequent (6.7%, 71940), VE Couplets were rare (<1.0%, 79), and VE Triplets were rare (<1.0%, 1). Ventricular Bigeminy and Trigeminy were present.  SUMMARY: 1. The basic rhythm is normal sinus with an average HR of 78 bpm 2. No atrial fibrillation or flutter 3. No high-grade heart block or pathologic pauses 4. There are frequent PVC's and a burden of 6.7% and rare supraventricular beats without sustained arrhythmias 5. There is a ventricular triplet, but no prolonged ventricular runs.       ______________________________________________________________________________________________  EKG:   EKG Interpretation Date/Time:  Tuesday October 06 2023 08:21:50 EDT Ventricular Rate:  70 PR Interval:  178 QRS Duration:  82 QT Interval:  398 QTC Calculation: 429 R Axis:   -3  Text Interpretation: Normal sinus rhythm Normal ECG When compared with ECG of 06-Sep-2012 23:49, No significant change was found Confirmed by Wonda Sharper [phone]) on 10/06/2023 8:29:02 AM    Recent Labs: 09/10/2023: ALT 22;  BUN 16; Creatinine, Ser 0.84; Hemoglobin 16.1; Magnesium  2.4; Platelets 225.0; Potassium 4.1; Sodium 140; TSH 3.24  Recent Lipid Panel    Component Value Date/Time   CHOL 120 09/10/2023 0923   CHOL 134 11/07/2019 0820   TRIG 307.0 (H) 09/10/2023 0923   HDL 37.90 (L) 09/10/2023 0923   HDL 51 11/07/2019 0820   CHOLHDL 3 09/10/2023 0923   VLDL 61.4 (H) 09/10/2023 0923   LDLCALC 21 09/10/2023 0923   LDLCALC  01/23/2023 1019     Comment:     . LDL cholesterol not calculated. Triglyceride levels greater than 400 mg/dL invalidate calculated LDL results. . Reference range: <100 . Desirable range <100 mg/dL for primary prevention;   <70 mg/dL for patients with CHD or diabetic patients  with > or = 2 CHD risk factors. SABRA LDL-C is now calculated using the Martin-Hopkins  calculation, which is a validated novel method providing  better accuracy than the Friedewald equation in the  estimation of LDL-C.  Gladis APPLETHWAITE et al. SANDREA. 7986;689(80): 2061-2068  (http://education.[employer].com/faq/FAQ164)    LDLDIRECT 59 11/07/2019 0820     Risk Assessment/Calculations:                Physical Exam:    VS:  BP 112/72   Pulse 71   Ht 5' 11 (1.803 m)   Wt 214 lb (97.1 kg)   SpO2 96%   BMI 29.85 kg/m     Wt Readings from Last 3 Encounters:  10/06/23 214 lb (97.1 kg)  09/10/23 215 lb 6.4 oz (97.7 kg)  05/21/23 217 lb 9.6 oz (98.7 kg)     GEN:  Well nourished, well developed in no acute distress HEENT: Normal NECK: No JVD; No carotid bruits LYMPHATICS: No lymphadenopathy CARDIAC: RRR, no murmurs, rubs, gallops RESPIRATORY:  Clear to auscultation without rales, wheezing or rhonchi  ABDOMEN: Soft, non-tender, non-distended MUSCULOSKELETAL:  No edema; No deformity  SKIN: Warm and dry NEUROLOGIC:  Alert and oriented x 3 PSYCHIATRIC:  Normal affect   Assessment & Plan Essential hypertension Blood pressure controlled.  Creatinine 0.8, potassium 4.1.  Continue  telmisartan . Hyperlipidemia LDL goal <70 Last lipids with a cholesterol 120, LDL 21, triglycerides 307.  Continue Zetia  and rosuvastatin .  Triglycerides significantly elevated again.  States that he eats too many sweets.  He will work on dietary modification and was counseled on reduction of sweets and simple starches.  Will start Vascepa 2 g twice daily.  Repeat labs in 3 to 4 months. Coronary artery disease involving native coronary artery of native heart with other form of angina pectoris Remote bare-metal stenting to an obtuse marginal, moderate RCA stenosis at time of last heart catheterization.  Patient with typical and atypical symptoms of angina/dyspnea.  Recommend exercise stress Myoview for further evaluation.  Continue clopidogrel , Nebivolol , and rosuvastatin . Symptomatic PVCs Heart monitor 2022 showed a PVC burden of approximately 7%.  Patient treated with a beta-blocker.      Informed Consent   Shared Decision Making/Informed Consent The risks [chest pain, shortness of breath, cardiac arrhythmias, dizziness, blood  pressure fluctuations, myocardial infarction, stroke/transient ischemic attack, nausea, vomiting, allergic reaction, radiation exposure, metallic taste sensation and life-threatening complications (estimated to be 1 in 10,000)], benefits (risk stratification, diagnosing coronary artery disease, treatment guidance) and alternatives of a nuclear stress test were discussed in detail with Mr. Ramus and he agrees to proceed.       Medication Adjustments/Labs and Tests Ordered: Current medicines are reviewed at length with the patient today.  Concerns regarding medicines are outlined above.  Orders Placed This Encounter  Procedures   MYOCARDIAL PERFUSION IMAGING   EKG 12-Lead   Meds ordered this encounter  Medications   icosapent Ethyl (VASCEPA) 1 g capsule    Sig: Take 2 capsules (2 g total) by mouth 2 (two) times daily.    Dispense:  120 capsule    Refill:  3     Patient Instructions  Medication Instructions:  START Vascepa 2g twice daily   *If you need a refill on your cardiac medications before your next appointment, please call your pharmacy*  Lab Work: None ordered today. If you have labs (blood work) drawn today and your tests are completely normal, you will receive your results only by: MyChart Message (if you have MyChart) OR A paper copy in the mail If you have any lab test that is abnormal or we need to change your treatment, we will call you to review the results.  Testing/Procedures: Your physician has requested that you have en exercise stress myoview. For further information please visit [email or web address] Please follow instruction sheet, as given.   Follow-Up: At [facility], you and your health needs are our priority.  As part of our continuing mission to provide you with exceptional heart care, our providers are all part of one team.  This team includes your primary Cardiologist (physician) and Advanced Practice Providers or APPs (Physician Assistants and Nurse Practitioners) who all work together to provide you with the care you need, when you need it.  Your next appointment:   1 year(s)  Provider:   Ozell Fell, MD    We recommend signing up for the patient portal called MyChart.  Sign up information is provided on this After Visit Summary.  MyChart is used to connect with patients for Virtual Visits (Telemedicine).  Patients are able to view lab/test results, encounter notes, upcoming appointments, etc.  Non-urgent messages can be sent to your provider as well.   To learn more about what you can do with MyChart, go to [email or web address].   Other Instructions Exercise Myoview (Stress Test) Instructions  Please arrive 15 minutes prior to your appointment time for registration and insurance purposes.   The test will take approximately 3 to 4 hours to complete; you may bring reading material.  If  someone comes with you to your appointment, they will need to remain in the main lobby due to limited space in the testing area. **If you are pregnant or breastfeeding, please notify the nuclear lab prior to your appointment**   How to prepare for your Myocardial Perfusion Test: Do not eat or drink 3 hours prior to your test, except you may have water. Do not consume products containing caffeine (regular or decaffeinated) 12 hours prior to your test. (ex: coffee, chocolate, sodas, tea). Do bring a list of your current medications with you.  If not listed below, you may take your medications as normal. Do wear comfortable clothes (no dresses or overalls) and walking shoes, tennis shoes preferred (No heels or open toe shoes are  allowed). Do NOT wear cologne, perfume, aftershave, or lotions (deodorant is allowed). If these instructions are not followed, your test will have to be rescheduled.   Please report to [address] [place], [place] [address] for your test.  If you have questions or concerns about your appointment, you can call the Nuclear Lab at [phone].   If you cannot keep your appointment, please provide 24 hours notification to the Nuclear Lab, to avoid a possible $50 charge to your account.     Signed, Ozell Fell, MD  10/06/2023 8:49 AM    [facility]

## 2023-10-07 ENCOUNTER — Telehealth (HOSPITAL_COMMUNITY): Payer: Self-pay | Admitting: *Deleted

## 2023-10-07 ENCOUNTER — Other Ambulatory Visit: Payer: Self-pay | Admitting: Cardiovascular Disease

## 2023-10-07 DIAGNOSIS — I25118 Atherosclerotic heart disease of native coronary artery with other forms of angina pectoris: Secondary | ICD-10-CM

## 2023-10-07 DIAGNOSIS — I10 Essential (primary) hypertension: Secondary | ICD-10-CM

## 2023-10-07 DIAGNOSIS — E785 Hyperlipidemia, unspecified: Secondary | ICD-10-CM

## 2023-10-07 DIAGNOSIS — I493 Ventricular premature depolarization: Secondary | ICD-10-CM

## 2023-10-07 NOTE — Telephone Encounter (Signed)
 Left message on voicemail per DPR in reference to upcoming appointment scheduled on 10/12/2023 at 10:30 with detailed instructions given per Myocardial Perfusion Study Information Sheet for the test. LM to arrive 15 minutes early, and that it is imperative to arrive on time for appointment to keep from having the test rescheduled. If you need to cancel or reschedule your appointment, please call the office within 24 hours of your appointment. Failure to do so may result in a cancellation of your appointment, and a $50 no show fee. Phone number given for call back for any questions.

## 2023-10-12 ENCOUNTER — Ambulatory Visit (HOSPITAL_COMMUNITY)
Admission: RE | Admit: 2023-10-12 | Discharge: 2023-10-12 | Disposition: A | Discharge status: Home / Self Care | Source: Ambulatory Visit | Attending: Internal Medicine | Admitting: Internal Medicine

## 2023-10-12 DIAGNOSIS — E785 Hyperlipidemia, unspecified: Secondary | ICD-10-CM | POA: Diagnosis present

## 2023-10-12 DIAGNOSIS — I10 Essential (primary) hypertension: Secondary | ICD-10-CM | POA: Diagnosis not present

## 2023-10-12 DIAGNOSIS — I493 Ventricular premature depolarization: Secondary | ICD-10-CM | POA: Insufficient documentation

## 2023-10-12 DIAGNOSIS — I25118 Atherosclerotic heart disease of native coronary artery with other forms of angina pectoris: Secondary | ICD-10-CM | POA: Diagnosis present

## 2023-10-12 LAB — MYOCARDIAL PERFUSION IMAGING
Angina Index: 0
Duke Treadmill Score: 10
Estimated workload: 10.1
Exercise duration (min): 9 min
Exercise duration (sec): 32 s
LV dias vol: 75 mL (ref 62–150)
LV sys vol: 21 mL (ref 4.2–5.8)
MPHR: 147 {beats}/min
Nuc Stress EF: 72 %
Peak HR: 129 {beats}/min
Percent HR: 87 %
Rest HR: 64 {beats}/min
Rest Nuclear Isotope Dose: 10.9 mCi
SDS: 0
SRS: 0
SSS: 0
ST Depression (mm): 0 mm
Stress Nuclear Isotope Dose: 31.4 mCi
TID: 0.82

## 2023-10-12 MED ORDER — TECHNETIUM TC 99M TETROFOSMIN IV KIT
31.4000 | PACK | Freq: Once | INTRAVENOUS | Status: AC | PRN
Start: 2023-10-12 — End: 2023-10-12
  Administered 2023-10-12: 31.4 via INTRAVENOUS

## 2023-10-12 MED ORDER — TECHNETIUM TC 99M TETROFOSMIN IV KIT
10.9000 | PACK | Freq: Once | INTRAVENOUS | Status: AC | PRN
  Administered 2023-10-12: 10.9 via INTRAVENOUS

## 2023-10-13 ENCOUNTER — Ambulatory Visit: Payer: Self-pay | Admitting: Cardiovascular Disease

## 2023-11-15 ENCOUNTER — Other Ambulatory Visit: Payer: Self-pay | Admitting: Family Medicine

## 2023-11-15 DIAGNOSIS — I2510 Atherosclerotic heart disease of native coronary artery without angina pectoris: Secondary | ICD-10-CM

## 2023-11-23 ENCOUNTER — Other Ambulatory Visit: Payer: Self-pay | Admitting: Cardiovascular Disease

## 2023-11-23 DIAGNOSIS — E785 Hyperlipidemia, unspecified: Secondary | ICD-10-CM

## 2023-11-23 DIAGNOSIS — I25118 Atherosclerotic heart disease of native coronary artery with other forms of angina pectoris: Secondary | ICD-10-CM

## 2023-11-23 DIAGNOSIS — I493 Ventricular premature depolarization: Secondary | ICD-10-CM

## 2023-11-23 DIAGNOSIS — I10 Essential (primary) hypertension: Secondary | ICD-10-CM

## 2023-12-01 ENCOUNTER — Other Ambulatory Visit: Payer: Self-pay | Admitting: Family Medicine

## 2023-12-01 DIAGNOSIS — R1319 Other dysphagia: Secondary | ICD-10-CM

## 2023-12-09 ENCOUNTER — Other Ambulatory Visit: Payer: Self-pay | Admitting: Family Medicine

## 2023-12-09 DIAGNOSIS — R1319 Other dysphagia: Secondary | ICD-10-CM

## 2024-03-23 ENCOUNTER — Telehealth: Admitting: Neurology
